# Patient Record
Sex: Female | Born: 1935
Health system: Southern US, Community
[De-identification: ages and names within clinical notes are randomized; demographics above are authoritative.]

## PROBLEM LIST (undated history)

## (undated) DIAGNOSIS — E119 Type 2 diabetes mellitus without complications: Secondary | ICD-10-CM

## (undated) DIAGNOSIS — I471 Supraventricular tachycardia, unspecified: Secondary | ICD-10-CM

## (undated) DIAGNOSIS — I4891 Unspecified atrial fibrillation: Secondary | ICD-10-CM

## (undated) DIAGNOSIS — G459 Transient cerebral ischemic attack, unspecified: Secondary | ICD-10-CM

## (undated) DIAGNOSIS — E785 Hyperlipidemia, unspecified: Secondary | ICD-10-CM

## (undated) DIAGNOSIS — I2510 Atherosclerotic heart disease of native coronary artery without angina pectoris: Secondary | ICD-10-CM

## (undated) HISTORY — DX: Supraventricular tachycardia, unspecified: I47.10

## (undated) HISTORY — PX: CARPAL TUNNEL RELEASE: SHX101

## (undated) HISTORY — DX: Hyperlipidemia, unspecified: E78.5

## (undated) HISTORY — DX: Atherosclerotic heart disease of native coronary artery without angina pectoris: I25.10

## (undated) HISTORY — DX: Supraventricular tachycardia: I47.1

## (undated) HISTORY — DX: Transient cerebral ischemic attack, unspecified: G45.9

## (undated) HISTORY — PX: REPLACEMENT TOTAL KNEE: SUR1224

## (undated) HISTORY — DX: Unspecified atrial fibrillation: I48.91

## (undated) HISTORY — PX: CHOLECYSTECTOMY: SHX55

## (undated) HISTORY — PX: ABDOMINAL HYSTERECTOMY: SHX81

## (undated) HISTORY — DX: Type 2 diabetes mellitus without complications: E11.9

---

## 1999-11-20 HISTORY — PX: SHOULDER SURGERY: SHX246

## 2000-02-07 ENCOUNTER — Encounter: Admission: RE | Admit: 2000-02-07 | Discharge: 2000-02-07 | Payer: Self-pay | Admitting: Orthopedic Surgery

## 2000-02-07 ENCOUNTER — Encounter: Payer: Self-pay | Admitting: Orthopedic Surgery

## 2000-02-15 ENCOUNTER — Encounter: Admission: RE | Admit: 2000-02-15 | Discharge: 2000-02-15 | Payer: Self-pay | Admitting: Orthopedic Surgery

## 2000-02-15 ENCOUNTER — Encounter: Payer: Self-pay | Admitting: Orthopedic Surgery

## 2000-02-16 ENCOUNTER — Ambulatory Visit (HOSPITAL_BASED_OUTPATIENT_CLINIC_OR_DEPARTMENT_OTHER): Admission: RE | Admit: 2000-02-16 | Discharge: 2000-02-16 | Payer: Self-pay | Admitting: Orthopedic Surgery

## 2000-03-08 ENCOUNTER — Encounter: Admission: RE | Admit: 2000-03-08 | Discharge: 2000-05-17 | Payer: Self-pay | Admitting: Orthopedic Surgery

## 2000-12-10 ENCOUNTER — Ambulatory Visit (HOSPITAL_BASED_OUTPATIENT_CLINIC_OR_DEPARTMENT_OTHER): Admission: RE | Admit: 2000-12-10 | Discharge: 2000-12-10 | Payer: Self-pay | Admitting: Orthopedic Surgery

## 2001-08-21 ENCOUNTER — Encounter: Payer: Self-pay | Admitting: Orthopedic Surgery

## 2001-08-26 ENCOUNTER — Inpatient Hospital Stay (HOSPITAL_COMMUNITY): Admission: RE | Admit: 2001-08-26 | Discharge: 2001-08-28 | Payer: Self-pay | Admitting: Orthopedic Surgery

## 2001-08-26 ENCOUNTER — Encounter: Payer: Self-pay | Admitting: Orthopedic Surgery

## 2001-08-28 ENCOUNTER — Inpatient Hospital Stay (HOSPITAL_COMMUNITY)
Admission: RE | Admit: 2001-08-28 | Discharge: 2001-09-01 | Payer: Self-pay | Admitting: Physical Medicine & Rehabilitation

## 2006-08-09 ENCOUNTER — Encounter: Payer: Self-pay | Admitting: Cardiology

## 2006-08-09 ENCOUNTER — Ambulatory Visit: Payer: Self-pay | Admitting: Cardiology

## 2006-08-16 ENCOUNTER — Ambulatory Visit: Payer: Self-pay | Admitting: Cardiology

## 2006-08-22 ENCOUNTER — Inpatient Hospital Stay (HOSPITAL_BASED_OUTPATIENT_CLINIC_OR_DEPARTMENT_OTHER): Admission: RE | Admit: 2006-08-22 | Discharge: 2006-08-22 | Payer: Self-pay | Admitting: Internal Medicine

## 2006-08-22 ENCOUNTER — Ambulatory Visit: Payer: Self-pay | Admitting: Internal Medicine

## 2006-09-11 ENCOUNTER — Ambulatory Visit: Payer: Self-pay | Admitting: Cardiology

## 2006-09-24 ENCOUNTER — Ambulatory Visit: Payer: Self-pay | Admitting: Cardiology

## 2006-10-15 ENCOUNTER — Encounter: Payer: Self-pay | Admitting: Cardiology

## 2006-10-17 ENCOUNTER — Ambulatory Visit: Payer: Self-pay | Admitting: Cardiology

## 2007-02-24 ENCOUNTER — Encounter: Payer: Self-pay | Admitting: Cardiology

## 2007-08-28 ENCOUNTER — Encounter: Payer: Self-pay | Admitting: Cardiology

## 2007-12-04 ENCOUNTER — Ambulatory Visit: Payer: Self-pay | Admitting: Cardiology

## 2009-01-04 ENCOUNTER — Ambulatory Visit: Payer: Self-pay | Admitting: Cardiology

## 2009-08-19 ENCOUNTER — Ambulatory Visit: Payer: Self-pay | Admitting: Cardiology

## 2009-08-19 DIAGNOSIS — I471 Supraventricular tachycardia, unspecified: Secondary | ICD-10-CM | POA: Insufficient documentation

## 2009-08-19 DIAGNOSIS — M25519 Pain in unspecified shoulder: Secondary | ICD-10-CM | POA: Insufficient documentation

## 2009-08-19 DIAGNOSIS — L659 Nonscarring hair loss, unspecified: Secondary | ICD-10-CM | POA: Insufficient documentation

## 2009-08-29 ENCOUNTER — Encounter (INDEPENDENT_AMBULATORY_CARE_PROVIDER_SITE_OTHER): Payer: Self-pay | Admitting: *Deleted

## 2010-02-27 ENCOUNTER — Ambulatory Visit: Payer: Self-pay | Admitting: Cardiology

## 2010-12-19 NOTE — Assessment & Plan Note (Signed)
Summary: 6 MO FU PER APRIL REMINDER-SRS   Visit Type:  Follow-up Primary Provider:  Quintin [name]  CC:  follow-up visit.  History of Present Illness: the patient is a pleasant 77 year old female who is her palpitations. Last office visit she reported alopecia and we changed the metoprolol to atenolol.this appear to have resolved.the patient reports occasional stinging pain in the chest which is very short-lived and resolved with Mylanta. She denies any exertional chest pain. She uses her stationary bicycle frequently and reports no problems of chest pain or shortness of breath. She does report some mild dyspnea upon walking. She denies any orthopnea PND. She has no recurrent palpitations.   Preventive Screening-Counseling & Management  Alcohol-Tobacco     Smoking Status: never  Current Problems (verified): 1)  Supraventricular Tachycardia  (ICD-427.89) 2)  Shoulder Pain, Right  (ICD-719.41) 3)  Alopecia  (ICD-704.00)  Current Medications (verified): 1)  Captopril 25 Mg Tabs (Captopril) .... Take 1 Tablet By Mouth Two Times A Day 2)  Atenolol 50 Mg Tabs (Atenolol) .... Take 1 Tablet By Mouth Once A Day 3)  Zocor 10 Mg Tabs (Simvastatin) .... Take 1 Tablet By Mouth Once A Day 4)  Aspirin 325 Mg Tabs (Aspirin) .... Take 1 Tablet By Mouth Once A Day 5)  Multivitamins  Tabs (Multiple Vitamin) .... Take 1 Tablet By Mouth Once A Day 6)  Caltrate 600+d 600-400 Mg-Unit Tabs (Calcium Carbonate-Vitamin D) .... Take 1 Tablet By Mouth Two Times A Day 7)  Glucosamine-Chondroitin  Caps (Glucosamine-Chondroit-Vit C-Mn) .... Take 1 Tablet By Mouth Two Times A Day  Allergies (verified): 1)  ! * Splenda  Comments:  Nurse/Medical Assistant: The patient's medications and allergies were reviewed with the patient and were updated in the Medication and Allergy Lists. List reviewed.  Past History:  Past Medical History: Last updated: 01/03/2009 nonobstructive coronary artery disease false positive  adenosine Cardiolite preserved left ventricular function history of paroxysmal supraventricular tachycardia, quiesced and on metoprolol diet-controlled diabetes mellitus dyslipidemia followed byprimary physician history of transient ischemic attack  Family History: Last updated: 01/03/2009 Mother:coronary artery disease  Social History: Last updated: 01/03/2009 Tobacco Use - No.  Alcohol Use - no  Risk Factors: Smoking Status: never (02/27/2010)  Review of Systems  The patient denies fatigue, malaise, fever, weight gain/loss, vision loss, decreased hearing, hoarseness, chest pain, palpitations, shortness of breath, prolonged cough, wheezing, sleep apnea, coughing up blood, abdominal pain, blood in stool, nausea, vomiting, diarrhea, heartburn, incontinence, blood in urine, muscle weakness, joint pain, leg swelling, rash, skin lesions, headache, fainting, dizziness, depression, anxiety, enlarged lymph nodes, easy bruising or bleeding, and environmental allergies.    Vital Signs:  Patient profile:   77 year old female Height:      61 inches Weight:      168 pounds Pulse rate:   65 / minute BP sitting:   112 / 72  (left arm) Cuff size:   regular  Vitals Entered By: [name] (February 27, 2010 8:42 AM) CC: follow-up visit   Physical Exam  Additional Exam:  General: Well-developed, well-nourished in no distress head: Normocephalic and atraumatic eyes PERRLA/EOMI intact, conjunctiva and lids normal nose: No deformity or lesions mouth normal dentition, normal posterior pharynx neck: Supple, no JVD.  No masses, thyromegaly or abnormal cervical nodes lungs: Normal breath sounds bilaterally without wheezing.  Normal percussion heart: regular rate and rhythm with normal S1 and S2, no S3 or S4.  PMI is normal.  No pathological murmurs abdomen: Normal bowel sounds, abdomen  is soft and nontender without masses, organomegaly or hernias noted.  No hepatosplenomegaly musculoskeletal:  Back normal, normal gait muscle strength and tone normal pulsus: Pulse is normal in all 4 extremities Extremities: No peripheral pitting edema neurologic: Alert and oriented x 3 skin: Intact without lesions or rashes cervical nodes: No significant adenopathy psychologic: Normal affect    EKG  Procedure date:  02/27/2010  Findings:      normal sinus rhythm. No acute ischemic changes.  Impression & Recommendations:  Problem # 1:  SUPRAVENTRICULAR TACHYCARDIA (ICD-427.89) No recurrent palpitations. Patient is tolerating atenolol. EKG is within normal limits. Her updated medication list for this problem includes:    Captopril 25 Mg Tabs (Captopril) .Marland Kitchen... Take 1 tablet by mouth two times a day    Atenolol 50 Mg Tabs (Atenolol) .Marland Kitchen... Take 1 tablet by mouth once a day    Aspirin 325 Mg Tabs (Aspirin) .Marland Kitchen... Take 1 tablet by mouth once a day  Orders: EKG w/ Interpretation (93000)  Problem # 2:  ALOPECIA (ICD-704.00) resolved after switching from metoprolol to atenolol  Problem # 3:  SHOULDER PAIN, RIGHT (ICD-719.41) patient had an x-ray which was within normal limits. This has resolved she does report a hard nodule over the left shoulder which is not mobile and nontender. I asked her to follow up with Dr. Quintin [name] on this. I suspect this is a ganglionic cyst although I cannot be certain and further evaluation may be required  Patient Instructions: 1)  Your physician recommends that you continue on your current medications as directed. Please refer to the Current Medication list given to you today. 2)  Follow up in  1 year.

## 2011-03-09 ENCOUNTER — Encounter: Payer: Self-pay | Admitting: *Deleted

## 2011-03-12 ENCOUNTER — Encounter: Payer: Self-pay | Admitting: Cardiology

## 2011-03-12 ENCOUNTER — Ambulatory Visit (INDEPENDENT_AMBULATORY_CARE_PROVIDER_SITE_OTHER): Payer: Medicare Other | Admitting: Cardiology

## 2011-03-12 VITALS — BP 118/75 | HR 50 | Ht 61.0 in | Wt 163.0 lb

## 2011-03-12 DIAGNOSIS — R072 Precordial pain: Secondary | ICD-10-CM

## 2011-03-12 DIAGNOSIS — L659 Nonscarring hair loss, unspecified: Secondary | ICD-10-CM

## 2011-03-12 DIAGNOSIS — I498 Other specified cardiac arrhythmias: Secondary | ICD-10-CM

## 2011-03-12 DIAGNOSIS — R079 Chest pain, unspecified: Secondary | ICD-10-CM

## 2011-03-12 MED ORDER — ATENOLOL 50 MG PO TABS: 50.0000 mg | ORAL_TABLET | Freq: Every day | ORAL | Status: DC

## 2011-03-12 NOTE — Progress Notes (Signed)
HPI The patient is an 77 year old female with history of palpitations. She reports all that she had in the last clinic visit and we changed her Toprol to atenolol. She reported atypical chest pain which was very short-lived and resolved with Mylanta. She continues use of stationary bicycle reports no problems with chest pain or shortness of breath. The patient reported an episode of chest pain last night. The pain did not resolve with nitroglycerin. She felt this was a sharp pain which was radiating to the back. She felt that she could burp that would relieve the pain. She tried some Zantac eventually Maalox made it better. There was no exertional component to the pain. Also her electrocardiogram today shows no acute changes. The patient has had significant problems with reflux in the past. The patient has been doing well from a cardiovascular perspective. She's had no recurrence of palpitations. Vital signs were stable during this visit.  Allergies  Allergen Reactions  . Food     SLENDA    Current Outpatient Prescriptions on File Prior to Visit  Medication Sig Dispense Refill  . aspirin 325 MG tablet Take 325 mg by mouth daily.        . Calcium Carbonate-Vitamin D (CALTRATE 600+D) 600-400 MG-UNIT per tablet Take 1 tablet by mouth 2 (two) times daily.        . captopril (CAPOTEN) 25 MG tablet Take 25 mg by mouth 2 (two) times daily.        Marland Kitchen glucosamine-chondroitin 500-400 MG tablet Take 1 tablet by mouth 2 (two) times daily.        . Multiple Vitamin (MULTIVITAMIN) tablet Take 1 tablet by mouth daily.        . simvastatin (ZOCOR) 10 MG tablet Take 10 mg by mouth at bedtime.        Marland Kitchen DISCONTD: atenolol (TENORMIN) 50 MG tablet Take 50 mg by mouth daily.          Past Medical History  Diagnosis Date  . Coronary artery disease     nonobstructive  . Diabetes mellitus     diet controlled  . Dyslipidemia     followed by primary care physician  . TIA (transient ischemic attack)   . History of  paroxysmal supraventricular tachycardia     quiesced and on metoprolol  . False positive cardiac stress test     false positive adenosine Cardiolite  . Left ventricular dysfunction with preserved left ventricular function     No past surgical history on file.  Family History  Problem Relation Age of Onset  . Coronary artery disease Mother     History   Social History  . Marital Status: Widowed    Spouse Name: N/A    Number of Children: N/A  . Years of Education: N/A   Occupational History  . Not on file.   Social History Main Topics  . Smoking status: Never Smoker   . Smokeless tobacco: Not on file  . Alcohol Use: No  . Drug Use: Not on file  . Sexually Active: Not on file   Other Topics Concern  . Not on file   Social History Narrative  . No narrative on file   The review of systems:Pertinent positives as outlined above. The remainder of the 18  point review of systems is negative   PHYSICAL EXAM BP 118/75  Pulse 50  Ht 5\' 1"  (1.549 m)  Wt 163 lb (73.936 kg)  BMI 30.80 kg/m2  General: Well-developed, well-nourished in  no distress Head: Normocephalic and atraumatic Eyes:PERRLA/EOMI intact, conjunctiva and lids normal Ears: No deformity or lesions Mouth:normal dentition, normal posterior pharynx Neck: Supple, no JVD.  No masses, thyromegaly or abnormal cervical nodes Lungs: Normal breath sounds bilaterally without wheezing.  Normal percussion Cardiac: regular rate and rhythm with normal S1 and S2, no S3 or S4.  PMI is normal.  No pathological murmurs Abdomen: Normal bowel sounds, abdomen is soft and nontender without masses, organomegaly or hernias noted.  No hepatosplenomegaly MSK: Back normal, normal gait muscle strength and tone normal Vascular: Pulse is normal in all 4 extremities Extremities: No peripheral pitting edema Neurologic: Alert and oriented x 3 Skin: Intact without lesions or rashes Lymphatics: No significant adenopathy Psychologic: Normal  affect  ECG:  ASSESSMENT AND PLAN

## 2011-03-12 NOTE — Patient Instructions (Signed)
Continue all current medications. Your physician wants you to follow up in: 6 months.  You will receive a reminder letter in the mail one-two months in advance.  If you don't receive a letter, please call our office to schedule the follow up appointment   

## 2011-03-12 NOTE — Assessment & Plan Note (Signed)
Controlled on atenolol. The Toprol was previously discontinued because of alopecia.

## 2011-03-12 NOTE — Assessment & Plan Note (Signed)
No recurrent chest pain continue medical therapy. The patient now is mainly symptoms of reflux. I recommended Prilosec over-the-counter. If the patient's chest pain continues or recurs then we will schedule her for a dobutamine echocardiogram because of a prior false-positive adenosine Cardiolite study.

## 2011-04-03 NOTE — Assessment & Plan Note (Signed)
[place] OFFICE NOTE   NAME:[name], [name] LANDERS                         MRN:          [id number]  DATE:12/04/2007                            DOB:          23-Jan-1927    PRIMARY CARDIOLOGIST:  Dr. Terald Sleeper.   REASON FOR VISIT:  Annual follow-up.   The patient returns to the office, since last being seen here by Dr.  Dannielle Burn in November 2007.  She has a history of nonobstructive coronary  artery disease by cardiac catheterization in October 2007, normal  ventricular function, hypertension, dyslipidemia, and diet-controlled  diabetes mellitus.   The patient also has history of PSVT which has been well controlled on  Lopressor, initially started by the late Dr. Carin Primrose.   In reviewing [name]'s chart, I find that she was initially referred to  Dr. Dannielle Burn in September 2007, following a stress Cardiolite which was  abnormal and suggestive of an anterior defect.  Her follow-up cardiac  catheterization, however, suggested nonobstructive CAD with lesions of  20-30% in the LAD and CFX arteries.   Clinically, although [name] is limited in her mobility secondary to  arthritis, she denies any exertional chest discomfort.  She also denies  any significant exertional dyspnea.   With respect to palpitations, she states that these have decreased in  frequency, since she was placed on Lopressor.  They occur perhaps once a  month and are not very symptomatic.   Electrocardiogram today reveals sinus bradycardia at 58 bpm with normal  axis and no ischemic changes.   MEDICATIONS:  Metoprolol 25 b.i.d., captopril 25 of b.i.d., aspirin 325  daily, Zocor 10 daily.   PHYSICAL EXAMINATION:  Blood pressure 126/72, pulse 72, regular weight  156.8 (Down 18 pounds).  General 77 year old female sitting upright in no distress.  HEENT:  Normocephalic, atraumatic.  NECK:  Palpable bilateral carotid pulse without bruits; no  JVD.  LUNGS:  Clear to auscultation all fields.  HEART:  Regular rate and rhythm (S1, S2), no significant murmurs.  ABDOMEN:  Benign, nontender.  EXTREMITIES:  1+ bilateral, nonpitting edema.  NEURO:  No focal deficit.   IMPRESSION:  1. Nonobstructive coronary artery disease.      a.     False positive adenosine Cardiolite  2. Preserved left ventricular function.  3. History of paroxysmal supraventricular tachycardia, quiescent on      metoprolol  4. Diet-controlled diabetes mellitus.  5. Dyslipidemia. followed by Dr. Woody Seller.  6. History of a transient ischemic attack.   PLAN:  1. Continue current medication regimen.  2. Aggressive lipid management, with LDL target of 70 or less, per Dr.      Woody Seller.  3. Schedule return clinic follow-up with Dr. Dannielle Burn in 1 year.      Gene Serpe, PA-C  Electronically Signed      Ernestine Mcmurray, MD,FACC  Electronically Signed   GS/MedQ  DD: 12/04/2007  DT: 12/04/2007  Job #: [id number]   cc:   Jerene Bears, MD

## 2011-04-06 NOTE — Discharge Summary (Signed)
. [facility]  Patient:    [name], [name] Visit Number: [id number] MRN: [id number]          Service Type: [facility] Location: [place] 02 Attending Physician:  Gilmore Laroche Dictated by:   Evert Kohl, P.A. Admit Date:  08/28/2001 Disc. Date: 08/28/01   CC:         Dr. Rayann Heman, [place]   Discharge Summary  DATE OF BIRTH:  Aug 06, 1927  ADMISSION DIAGNOSES: 1. Osteoarthritis of bilateral knees, left worse than right. 2. Type diabetes mellitus. 3. Benign positional vertigo. 4. Cataracts.  HISTORY OF PRESENT ILLNESS:  The patient is a 77 year old female who presents with a 10-year history of gradually worsening left knee pain.  The patient has OA in bilateral knees.  Left knee arthroscopy performed in 1997, with no previous injury.  The pain is intermittent with throbbing and burning sensation, diffuse about the knee with occasional radiation down to the ankle. The pain worsens with weightbearing activities.  It does improve with rest. She does have popping, slipping and occasional night pain and she has been using a cane for six months for ambulation.  Hyalgan and Voltaren gives her some moderate relief.  ALLERGIES:  No known drug allergies.  MEDICATIONS: 1. Aspirin 325 mg p.o. q.d. 2. Voltaren 50 mg p.o. b.i.d. 3. Glucosamine chondroitin 900 mg b.i.d. 4. Lipitor 5 mg p.o. q.4h. 5. Multivitamin one tablet p.o. q.d. 6. Calcium with D 600 mg b.i.d. 7. Captopril 12.5 mg p.o. b.i.d. 8. Iron 325 mg b.i.d.  PROCEDURE:  On August 26, 2001, the patient was taken to the OR by Dr. Gildardo Cranker, assisted by Zenaida Deed, P.A.-C.  Under general anesthesia, a left total knee arthroplasty was performed using a three pegged cemented patella with a standard left cemented femoral component with a size 3 cemented tibial Kiel tray and a 10 mm standard poly insert.  The patient tolerated the procedure well.  There were no complications.  One  Hemovac drain was left in place.  CONSULTATIONS: 1. Physical therapy. 2. Occupational therapy. 3. Case management. 4. Rehabilitation.  HOSPITAL COURSE:  On August 26, 2001, the patient was admitted to [facility] under the care of Dr. Barbaraann Rondo A. Mortenson.  The patient was taken to the OR where a left total knee arthroplasty was performed.  The patient tolerated the procedure well and was transferred to rehabilitation and then orthopedic floor for further rehab care.  The patient was started on Arixtra 2.5 mg subcu for a total of seven days for postoperative DVT prophylaxis.  On postop day #1, the patient was fairly comfortable without any significant complaints.  Her H&H was 8.0 and 23.9.  She had two units of autologous blood available so these were transfused without any significant untoward events. Otherwise, the patients labs were within acceptable ranges.  The patients leg was neurovascularly intact and the patient was tolerating the CPM well.  On postop day #2, the patients H&H did improve to 9.5 and 27.8.  She was slightly febrile at 101.7, but her lungs were clear.  There was no urinary symptoms.  Her left surgical wound was benign for any signs of infection and the patient had no other significant complaints.  It was felt to be related to atelectasis.  The patient was encouraged to deep cough breathe and use the incentive spirometer frequently throughout every hour that she is awake.  The wound on her left knee was benign with no signs  of erythema or discharge.  She had no Homans sign.  Otherwise, her vital signs were normal and her pain was well-controlled.  The patient was taken off her PCA and placed on OxyContin CR 10 mg p.o. q.12h. and Percocet p.r.n.  A bed was available for a rehab transfer today.  The patient wished to go to rehabilitation and we felt that she was orthopedically stable, so she was transferred to rehab in good condition.  LABORATORY DATA  AND X-RAY FINDINGS:  CBC on October 10, with WBC 7.3, hemoglobin 9.5, hematocrit 27.8, platelets 215.  Routine chemistries with sodium 140, potassium 3.4, chloride 103, CO2 31, BUN 9, creatinine 0.8, glucose 129.  Limited CT of the chest without contrast to reevaluate a chest x-ray that showed a questionable nodular density in left base on admission. EKG was normal sinus rhythm at 77 beats per minute.  DISCHARGE MEDICATIONS:  1. Colace 100 mg p.o. b.i.d.  2. Senokot 8.6 mg p.o. b.i.d. q.a.c.  3. Arixtra 2.5 mg subcu q.24h.  4. Zocor 10 mg p.o. q.h.s.  5. Multivitamins with mineral one capsule p.o. q.d.  6. Calcium carbonate with D one tablet p.o. b.i.d.  7. Capoten 12.5 mg p.o. b.i.d.  8. Ferrous sulfate 325 mg p.o. b.i.d.  9. Reglan 10 mg IV q.6h. p.r.n. 10. Benadryl 25 mg p.o. q.6h. p.r.n. 11. Percocet one to two tablets p.o. q.4h. p.r.n. 12. OxyContin CR 10 mg p.o. q.12h. 13. Laxative or enema of choice. 14. Robaxin 500 mg p.o. q.6h. p.r.n. 15. Restoril 15 mg p.o. q.h.s. p.r.n.  SPECIAL INSTRUCTIONS:  The patient is to continue medications as administered on orthopedics floor.  The patient is to have Arixtra 2.5 mg subcu q.d. for a total of seven days.  DIET:  No restrictions.  ACTIVITY:  As tolerated.  Maintain 50% of body weight on left lower extremity with use of a walker until instructed otherwise by Dr. Alphonzo Cruise.  WOUND CARE:  Keep wound clean and dry.  Check daily for any signs of infection.  FOLLOWUP:  Follow-up appointment with Dr. Alphonzo Cruise in one week from this following Monday.  Please call for follow-up appointment.  CONDITION ON DISCHARGE:  Condition on discharge to rehab is improved and good. ictated by:   Evert Kohl, P.A. Attending Physician:  Gilmore Laroche DD:  08/28/01 TD:  08/28/01 Job: [id number]

## 2011-04-06 NOTE — Op Note (Signed)
[place]. [facility]  Patient:    [name], [name]                         MRN: [id number] Proc. Date: 02/16/00 Adm. Date:  [id number] Attending:  Pryor Curia                           Operative Report  PREOPERATIVE DIAGNOSIS:  Rotator cuff tear, right shoulder.  POSTOPERATIVE DIAGNOSIS:  Rotator cuff tear, right shoulder.  OPERATION:  NEER anterior one-third acromioplasty and repair rotator cuff using  Mitek sutures.  SURGEON:  Geroge Baseman. Alphonzo Cruise, M.D.  ANESTHESIA:  General  DESCRIPTION OF PROCEDURE:  After satisfactory endotracheal anesthesia, the patient was placed on the operating table in the semisitting position.  The right shoulder and upper extremity was prepped with Duraprep and draped out in the usual manner. Incision made over the anterior and lateral aspect of the acromion.  The deltoid fibers were released off the anterior aspect of the acromion.  Subacromion space was entered. A very generous NEER anterior one-third acromioplasty was done with a power saw.  Excellent decompression of the anterior aspect of acromion was achieved.  This gave good access to the subacromial space.  The subacromial bursa was excised.  There was a massive tear of the rotator cuff which was retracted .5 to 2 cm.  The cuff could be brought down and covered about two-thirds of the anterior portion of the humeral head.  The posterior third or one-quarter was not covered posteriorly.  A series of three Mitek staples were placed anterior at the junction of the articular and nonarticular surface about the greater tuberosity. Sutures were placed through the rotator cuff and the rotator cuff was then advanced distally with the arm abducted and sutured in place.  An excellent repair with he Mitek sutures was achieved. Again a water tight closure was not possible but good mechanical stability of the shoulder was achieved.  Throughout the  procedure, the shoulder was irrigated with copious amounts of antibiotics solution.  No other pathology was seen. The deltoids were then reattached with heavy Vicryl suture.  2-0 Vicryl was used to close subcutaneous tissue and a 2-0 nylon subcuticular stitch was used to close the wounds.  Sterile dressings were applied and the patient returned to the recovery room in excellent condition.  The patient was placed in an abduction pillow postoperatively.  Complications were none. Drains  none. DD:  02/16/00 TD:  02/17/00 Job: [id number]

## 2011-04-06 NOTE — Discharge Summary (Signed)
[place]. [facility]  Patient:    [name] Visit Number: [id number] MRN: [id number]          Service Type: [facility] Location: [id number] Attending Physician:  Gilmore Laroche Dictated by:   Lavon Paganini. [place], P.A. Admit Date:  08/28/2001 Disc. Date: 09/01/01   CC:         [name] A. Alphonzo Cruise, M.D.  Carin Primrose, M.D.,[address], [place], [place] [address]   Discharge Summary  DISCHARGE DIAGNOSES: 1. Left total knee arthroplasty on August 26, 2001. 2. Anemia. 3. Hypertension. 4. Diet controlled diabetes mellitus. 5. Urinary retention, resolving. 6. Hyperlipidemia.  HISTORY OF PRESENT ILLNESS:  A 77 year old white female admitted on August 26, 2001 with end-stage degenerative joint disease of the left knee.  No relief with conservative care.  Underwent a left total knee arthroplasty on August 26, 2001 by Dr. Alphonzo Cruise.  Placed on Arixtra for deep venous thrombosis prophylaxis and 50% partial weight bearing.  Postoperative anemia and transfused.  No chest pain or shortness of breath.  Preoperative chest x-ray showed a nodule with follow up CT of the chest postoperatively negative for any masses.  Minimal assistance for ambulation. Moderate assist transfers. Latest hemoglobin 9.5. Chemistries unremarkable.  Admitted for a comprehensive rehab program.  PAST MEDICAL HISTORY:  See discharge diagnoses.  PAST SURGICAL HISTORY:  Hysterectomy, carpal tunnel release, right rotator cuff repair.  PRIMARY PHYSICIAN:  Dr. Liane Comber of [place], [place].  ALLERGIES:  None.  HABITS:  No alcohol or tobacco.  MEDICATIONS PRIOR TO ADMISSION:  Aspirin, Voltaren, Lipitor, Captopril and iron sulfate.  SOCIAL HISTORY:  Lives with husband in [place], [place]. Independent with a cane prior to admission. One level home. Three steps to entry. Husband with recent coronary artery bypass surgery as well as back surgery.  Local family with assistance.  HOSPITAL  COURSE:  The patient did well while in rehabilitation services with therapies initiated on a b.i.d. basis.  The following issues were followed during the patients rehab course:  Pertaining to [name] left total knee arthroplasty remained stable.  Surgical site healing nicely.  No signs of infection.  She would follow up with Dr. Alphonzo Cruise in one week for removal of clips.  She is 50% partial weight bearing.  Ambulating household distances with a walker.  Using CPM machine 0 to 75 degrees.  This had been ordered upon discharge to continue to maximum of 90 degrees.  She continued on Arixtra during her rehab course.  Venous Doppler studies prior to discharge were negative.  She would resume her aspirin therapy.  Blood pressures were controlled with Captopril.  No headache or dizziness.  Blood sugars with diet control of 1800 calorie ADA. Blood sugars ranged 116, 101, 135.  Noted urinary retention postoperatively after knee surgery with post void residuals of greater than 800 cc.  This continued to improve as her mobility improved. She was placed on Flomax on August 31, 2001.  Bladder scan 47 cc and 30 cc.  She did have a urinalysis study greater than 30,000 Enterococcus. Due to her symptomatic issues she was placed on Macrobid times seven days.  Overall for her functional mobility, she was ambulating extended household distances with a walker.  Essentially independent to standby assist in all areas of activities of daily living of dressing, grooming and homemaking. Overall her strength and endurance greatly improved.  She was encouraged with overall progress.  She was discharged home with ongoing home therapies.  Latest labs  showed a hemoglobin 9.4, hematocrit 27. Sodium 138, potassium 3.2, BUN 9, creatinine 0.7.  DISCHARGE MEDICATIONS: 1. Captopril 25 mg 1/2 tablet b.i.d. 2. Lipitor 5 mg q.d. 3. Os-Cal 500 mg b.i.d. 4. Iron sulfate b.i.d. 5. Tylox one or two tablets q.4h. p.r.n.  pain. 6. Multivitamin q.d. 7. Flomax 0.4 mg at bedtime. 8. Ecotrin 325 mg q.d. 9. Macrobid 100 mg b.i.d.  ACTIVITY:  Partial weight bearing.  No driving.  Home Health physical and occupational therapy.  Home CPM machine for recent knee surgery.  Increased 90 degrees.  DIET:  1800 calorie ADA.  FOLLOW-UP:  She is to follow up with Dr. Alphonzo Cruise in one week for removal of staples.  She should follow up Dr. Alphonzo Cruise, orthopedic services as advised and Dr. Liane Comber, medical management. Dictated by:   Lavon Paganini. [place], P.A. Attending Physician:  Gilmore Laroche DD:  09/01/01 TD:  09/01/01 Job: [id number]

## 2011-04-06 NOTE — Assessment & Plan Note (Signed)
[place] OFFICE NOTE   NAME:Orsino, EZME GLANCY                         MRN:          [id number]  DATE:09/11/2006                            DOB:          11-21-1926    REFERRING PHYSICIAN:  Carin Primrose   HISTORY OF PRESENT ILLNESS:  The patient is a 77 year old female with  history of dyslipidemia and diet-controlled diabetes.  The patient underwent  recent cardiac catheterization, which showed no significant coronary artery  disease.  The patient does report ongoing palpitations, but no definite  chest pain.  She has no orthopnea or PND.  She has no syncope.   MEDICATIONS:  1. Prilosec 2 q. day.  2. Zocor 20 mg p.o. nightly.  3. Calcium.  4. Multivitamin.  5. Chondroitin glucosamine.  6. Aspirin 325 q. day.  7. Lopressor 25 b.i.d.  8. Captopril 25 mg p.o. b.i.d.   PHYSICAL EXAMINATION:  VITAL SIGNS:  Blood pressure 118/66, heart rate is 70  beats per minute.  NECK:  Normal carotid upstroke.  No carotid bruits.  LUNGS:  Clear breath sounds bilaterally.  HEART:  Regular rate and rhythm.  Normal S1, S2.  No murmurs, rubs, or  gallops.  ABDOMEN:  Soft, nontender.  No rebound or guarding.  Good bowel sounds.  EXTREMITIES:  No cyanosis, clubbing, or edema.   A 12-lead electrocardiogram shows normal sinus rhythm.  No acute ischemic  changes.   PROBLEM LIST:  1. Palpitations.  2. Substernal chest pain, resolved status post negative cardiac      catheterization.  3. Knee surgery.  4. Dyslipidemia.  5. History of diet-controlled diabetes mellitus.   PLAN:  1. The patient is doing well.  She has no substernal chest pain.  A      catheterization showed nonobstructive coronary artery disease.  2. The patient does appear to have palpitations and we will order      CardioNet monitor to make      sure she does not have atrial fibrillation.  3. The patient will follow up with [place] in 4 weeks.       Ernestine Mcmurray, MD,FACC     GED/MedQ  DD:  09/11/2006  DT:  09/12/2006  Job #:  [id number]   cc:   Carin Primrose

## 2011-04-06 NOTE — Cardiovascular Report (Signed)
NAMEWILLISTINE, CRIDER                  ACCOUNT NO.:  [id number]   MEDICAL RECORD NO.:  [id number]          PATIENT TYPE:  OIB   LOCATION:  1961                         FACILITY:  [facility]   PHYSICIAN:  Shaune Pascal. Bensimhon, MDDATE OF BIRTH:  Feb 20, 1927   DATE OF PROCEDURE:  08/22/2006  DATE OF DISCHARGE:                              CARDIAC CATHETERIZATION   PRIMARY CARE PHYSICIAN:  Dr. Carin Primrose.   CARDIOLOGIST:  Dr. Terald Sleeper.   PATIENT IDENTIFICATION:  Ms. Loch is a delightful 77 year old woman with a  history of diabetes, hypertension and hyperlipidemia.  She recently has been  having exertional chest pain.  She had a Myoview study which was abnormal  and she is referred for cardiac catheterization in the outpatient diagnostic  lab.   PROCEDURES PERFORMED:  1. Selective coronary angiography.  2. Left heart cath.  3. Left ventriculogram.   DESCRIPTION OF PROCEDURE:  The risks and benefits of catheterization were  explained.  Consent was signed and placed on the chart.  A 4-French arterial  sheath was placed in the right femoral artery using modified Seldinger  technique.  Standard catheters including JL-4, 3-D RC and an angled pigtail  were used for the catheter.  All catheter exchanges were made over a wire.  There were no apparent complications.   Central aortic pressure was 148/70 with a mean of 104.  LV pressure was  145/7 with an EDP of 16.  There was no aortic stenosis.   Left main was normal.   LAD was a long vessel wrapping the apex.  It gave off a small first diagonal  and a large second diagonal.  There was a 30% lesion in the proximal portion  of the LAD.   Left circumflex was moderate size system.  It gave off a large OM1 and a  small posterolateral.  There was a 20% stenosis in the mid section.   Right coronary artery was a moderate sized dominant vessel.  It gave off an  RV branch, a small PDA and a small posterolateral.  There was no  angiographic CAD.   Left ventriculogram done in the RAO position showed an EF of 65% with no  regional wall motion abnormalities.  There was no mitral regurgitation.  There was significant mitral annular calcification noted.   ASSESSMENT:  1. Minimal nonobstructive coronary artery disease as described above.  2. Normal left ventricular function.  3. Significant mitral annular calcification without any significant mitral      regurgitation.   PLAN/DISCUSSION:  I suspect Ms. Yodice's chest pain is likely noncardiac.  Will continue risk factor modification.  If her pain continues consider  workup of other etiologies.      Shaune Pascal. Bensimhon, MD  Electronically Signed     DRB/MEDQ  D:  08/22/2006  T:  08/23/2006  Job:  [id number]   cc:   Lolita Rieger, MD,FACC

## 2011-04-06 NOTE — Assessment & Plan Note (Signed)
[place] OFFICE NOTE   NAME:[name], [name]                         MRN:          [id number]  DATE:08/16/2006                            DOB:          October 01, 1927    REFERRING PHYSICIAN:  Carin Primrose   REASON FOR CONSULTATION:  Evaluation of an elderly patient with chest pain  and abnormal Cardiolite stress study.   HISTORY OF PRESENT ILLNESS:  The patient is a 77 year old female with a  history of dyslipidemia and diet-controlled diabetes mellitus.  The patient  has no prior history of coronary artery disease.  However, over he last  several weeks she has reported onset of exertional chest pain.  She states  that she has some chest tightness with some radiation to the left shoulder  and left arm.  She has not experienced any resting chest pain.  She  typically goes out in her back yard to feed the dog, and might develop some  chest discomfort.  Given this history, the patient was set up for a  Cardiolite stress study by Dr. Liane Comber, which was interpreted by Dr.  Domenic Polite.  The patient received adenosine.  There were ST segment changes on  1 to 1.5 mm in lead II, III and aVF during the adenosine infusion.  There  was also a reversible anteroapical defect, showed summed stress score of 6,  the ejection fraction was normal 66%.  Dr. Domenic Polite was concerned that this  could represent ischemia.  The patient then saw Dr. Liane Comber in followup.  She  reported on Monday an episode of substernal chest pain that lasted a few  minutes when she walked from the kitchen to the front door.  There was  associated left shoulder radiation and left arm pain.  There were some  associated palpitations, but no sweatiness or diaphoresis.  The patient  denies any nausea.  She also reports no syncope.  The patient was then  referred for an evaluation in the office today.  She was started in the  interim on Zocor, Lopressor and  nitroglycerin per Dr. Liane Comber.  She has  remained pain-free since Monday.  She also reports no orthopnea or PND.   SOCIAL HISTORY:  The patient lives in [place].  Her husband died from sudden  cardiac death in 03/02/02.  The patient does not smoke.   FAMILY HISTORY:  Notable for her mother, who had congestive heart failure as  an elderly.   PAST MEDICAL HISTORY:  See problem list below.   MEDICATIONS:  1. Prilosec 2 tablets daily.  2. Capoten 25 mg two tablets p.o. b.i.d.  3. Zocor 10 mg two tablets p.o. daily.  4. Calcium.  5. Vitamin D.  6. Vitamin E.  7. Lopressor 50 mg p.o. daily.  8. Chondroitin glucosamine 2 tablets daily.  9. Aspirin 325 mg p.o. daily.  10.Diclofenac 20 mg every other day.   ALLERGIES:  The patient does not have dye allergies.   REVIEW OF SYSTEMS:  As per HPI.  No nausea or vomiting, no claudication, no  dysuria  or frequency, no melena or hematochezia, no orthopnea or PND, no  syncope.  The patient walks with a cane and some unsteady gait.   PHYSICAL EXAMINATION:  VITAL SIGNS:  Blood pressure is 130/76, heart rate is  60 beats per minute, weight is 173 pounds.  GENERAL:  Well-nourished white female in no apparent distress.  HEENT:  Pupils anicteric, conjunctivae clear.  NECK:  Supple, normal carotid upstroke, no carotid bruits.  JVD 6 cm.  No  thyromegaly.  LUNGS:  Clear breath sounds bilaterally.  HEART:  Regular rate and rhythm with normal S1, S2, no murmurs, rubs or  gallops.  I heard an occasional extra systole.  ABDOMEN:  Soft, nontender, no rebound or guarding.  Good bowel sounds.  EXTREMITIES:  No cyanosis, clubbing or edema.  NEUROLOGIC:  The patient is alert, oriented, and grossly nonfocal.   A 12-lead electrocardiogram normal sinus rhythm with no acute ischemic  changes.   Laboratory work is pending in preparation for catheterization.  Of note is  that the patient does not have dye allergies.   PROBLEM LIST:  1. Substernal chest pain.       a.     Rule out coronary artery disease.      b.     Abnormal Cardiolite stress study with ST segment changes during       adenosine infusion and an anterior defect.      c.     Chest pain with typical and atypical features.      d.     Cardiac risk factors include dyslipidemia and diabetes.  2. History of knee surgery.  3. History of arthritis.  4. History of dyslipidemia.  5. History of diet-controlled diabetes mellitus.   PLAN:  1. The patient does have multiple risk factors for coronary artery      disease.  Particularly the episode from Monday is concerning for      angina, although there are some atypical features to her chest pain      syndrome.  The patient did have an abnormal Cardiolite stress study.  I      have discussed with the patient that the best course of action is to      proceed with a cardiac catheterization to rule out significant coronary      artery disease.  The patient is in agreement with this, and we will      proceed next week.  2. The patient is on good medical therapy, which has been initiated by Dr.      Liane Comber in the interim.  3. Discussed the risks and benefits of the cardiac catheterization with      the patient, and she is willing to proceed.       Ernestine Mcmurray, MD,FACC     GED/MedQ  DD:  08/16/2006  DT:  08/18/2006  Job #:  [id number]   cc:   Carin Primrose

## 2011-04-06 NOTE — Op Note (Signed)
[place]. [facility]  Patient:    [name], [name] LANTAGNE. Visit Number: [id number] MRN: [id number]          Service Type: Attending:  Geroge Baseman. Alphonzo Cruise, M.D. Dictated by:   Geroge Baseman Alphonzo Cruise, M.D. Proc. Date: 08/26/01                             Operative Report  PREOPERATIVE DIAGNOSIS:  Severe osteoarthritis, left knee.  POSTOPERATIVE DIAGNOSIS:  Severe osteoarthritis, left knee.  PROCEDURE:  Total knee replacement on the left using a three-peg cemented patella with a standard left cemented femoral component, with a size 3 cemented tibial keeled tray, with a 10 mm standard-size poly insert.  ANESTHESIA:  General.  SURGEON:  Rodney A. Alphonzo Cruise, M.D.  ASSISTANT:  Zenaida Deed, P.A.  DESCRIPTION OF PROCEDURE:  The patient placed on the operating table in supine position and a pneumatic tourniquet about the left upper thigh.  The entire left lower extremity was prepped with Duraprep and draped out in the usual manner after satisfactory oroendotracheal anesthesia.  A Vi-Drape was placed over the operative site.  The leg was wrapped out with an Esmarch and the tourniquet was elevated.  An incision made starting down near the tubercle and carried proximally to the patella.  A self-retaining retractor was put in the wound, bleeders were coagulated.  A long median parapatellar incision made with the cutting cautery.  The patella was everted.  The fat pad was partially excised.  The knee was flexed 90 degrees, both medial and lateral meniscus were excised.  Tibial guide #1 was placed over the anterior aspect of the tibia, put to what was felt to be the appropriate level, fixed with fixation pins.  The tibial guide was removed, and the external guide was applied to the cutting block.  Good alignment was felt to be achieved.  The capture guide was then applied to the cutting block, and the proximal end of the tibia was amputated.  An excellent cut was achieved.   Both the medial and lateral meniscus were excised, and the anterior and posterior cruciate ligaments were released off the femur.  Attention was then turned to the distal end of the femur.  A box was placed over the anterior aspect of the femur, and femoral guide #1 was placed over the anterior aspect of the femur.  A drill hole was made and the intramedullary rod was inserted.  The C clamp was inserted and the femoral guide placed on the cut surface of the tibia, the tibia flexed 90 degrees.  This had the proper rotation for the femoral guide, and this was locked into place with fixation pins.  Using a capture guide, both anterior and posterior condyles of the femur were amputated.  Femoral guide #1 was removed, and femoral guide #2 was placed over the anterior cut surface of the femur, held in position, and locked in place with fixation pins.  Placing the knee in extension, the spacer block was inserted, and this was felt to be the appropriate-length cut.  The capture guide was applied, and the distal end of the femur was amputated.  The 10 mm spacer block was put in place with the knee in full extension.  There was excellent balance in both medial and lateral collateral ligaments.  With the knee flexed 90 degrees, there was also excellent balancing over the medial and lateral collateral ligaments with a 10 mm insert.  Femoral guide #3 was placed over the distal end of the femur, locked in position, and the chamfer cuts were made.  Attention then turned to the proximal tibia.  A McCale retractor was used to sublux the tibia anteriorly.  A size 3 trial was put on the cut surface of the tibia and locked in place with fixation pins.  A tower was applied, and a drill hole was placed in the proximal tibia.  The keel with wings was then driven down into the tibial tray and locked in position.  The 10 mm poly trial was inserted, and the trial femoral component was inserted over the distal end of  the femur. The knee was put through a full range of motion.  There was full flexion, full extension, good stability to varus and valgus stress as well as in extension and flexion.  Attention now turned to the posterior aspect of the patella. The patella cutting guide was applied, the posterior aspect of the patella amputated, and then three holes placed over the posterior aspect of the patella using a hole guide.  This was removed, and the patella trial was removed.  The knee was then put through a full range of motion.  There was some lateral tilting of the patella, and a lateral release was done.  This realigned the patella very nicely.  The knee was quite stable.  The anterior drawer sign was negative.  Once again there was good balancing of all ligaments.  All the components were removed, pulsatile lavage was used, all debris removed.  The antibiotic solution was placed in the bone and vacuumed out.  Glue was then mixed, all the components were placed on the back table. Glue was placed over the cut surface of the proximal tibia, and the tibial tray was inserted and driven home with an impacter.  Excess glue was removed. Glue was placed over the distal end of the femur and the posterior runners of the femoral component.  The femoral component was then inserted, and this fit very nicely.  Excess glue was removed.  A 10 mm spacer trial was then inserted, and the knee was put in full extension and excess glue was then removed.  Glue was placed on the posterior aspect of the patella, and the patellar prosthesis was inserted, placing the patellar clamp.  Again excess glue was removed.  Once the glue had hardened, all the clamps were removed. An osteotome was used to remove any flecks of bone cement around the tibia, the patella, and the femoral component.  The tibial insert was then removed. All debris was removed.  The tourniquet was dropped, bleeders were coagulated. A fairly dry wound was  achieved.  A Hemovac drain was then inserted.  The final tibial insert was inserted, and the knee was put through a full range of  motion and again there was excellent balancing of all ligaments.  The knee was then flexed about 90 degrees.  Towel clips were used to close the wound and an interrupted line of sutures using Ethibond was used to close along the median parapatellar incision.  _____ was used to close the subcutaneous tissue and stainless steel staples used to close the skin.  Sterile dressings were applied.  The patient returned to the recovery room in excellent condition. Technically this procedure went beautifully.  COMPLICATIONS:  None.  DRAINS:  Hemovac. Dictated by:   Geroge Baseman Alphonzo Cruise, M.D. Attending:  Geroge Baseman Alphonzo Cruise, M.D. DD:  08/26/01 TD:  08/27/01  Job: [id number]

## 2011-04-06 NOTE — H&P (Signed)
[place]. [facility]  Patient:    [name], [name]. Visit Number: [id number] MRN: [id number]          Service Type: Attending:  Geroge Baseman. Alphonzo Cruise, M.D. Dictated by:   Zenaida Deed, P.A. Adm. Date:  08/26/01                           History and Physical  DATE OF BIRTH: July 17, 1927  CHIEF COMPLAINT: A ten year history of left knee pain.  HISTORY OF PRESENT ILLNESS: This 77 year old white female patient presents to Dr. Alphonzo Cruise with a ten year history of gradual onset but progressively worsening left knee pain.  She has a history of a left knee arthroscopy by Dr. Alphonzo Cruise in 1997 in which he removed several large loose bodies from her knee and said she had significant osteoarthritis.  She has had no other injury or prior surgery to the knee, and the pain has been getting progressively worse.  At this point the pain is described as an intermittent throb or burn located diffusely about the knee with occasional radiation down into the ankle.  It increases with any prolonged weightbearing on the leg and the pain is pretty much constant if she is bearing weight on her leg.  The pain does decrease with rest and elevation.  She is currently taking Voltaren for pain and that provides a moderate amount of relief.  The knee does feel like it pops and slips at times but there is no swelling and only occasional night pain.  No grinding, locking, or giving way.  She has tried Hyalgan and cortisone shots in the past and that only provided relief for several weeks.  She is currently ambulating with the use of a cane, and has done so for the last six months.  ALLERGIES: No known drug allergies.  CURRENT MEDICATIONS:  1. Aspirin 325 mg one tablet p.o. q.d. (Last dose August 20, 2001).  2. Voltaren 50 mg one tablet p.o. b.i.d.  3. Glucosamine and chondroitin sulfate 900 mg one tablet p.o. b.i.d.  4. Lipitor 5 mg one tablet p.o. q.h.s.  5. Multivitamin one tablet  p.o. q.d.  6. Calcium + D 600 mg one tablet p.o. b.i.d.  7. Captopril 25 mg 1/2 tablet p.o. b.i.d.  8. Ferrous sulfate 325 mg one tablet p.o. b.i.d.  PAST MEDICAL HISTORY:  1. She has type 2 diabetes mellitus for the last three years, which is just     diet controlled.  She is on the Captopril just to help her kidney function     due to her diabetes.  2. She does have osteoarthritis in multiple joints.  3. Some benign positional vertigo, which was diagnosed approximately a month     ago.  This occurs mostly at night.  She denies any history of hypertension, thyroid disease, hiatal hernia, reflux, peptic ulcer disease, heart disease, asthma, or any other chronic medical condition other than noted previously.  PAST SURGICAL HISTORY:  1. Excision of a growth on her lumbar sacral spine by Dr. Stephenie Acres in February 1949.  2. Hysterectomy by Dr. Claudie Fisherman in April 1962.  3. Right open shoulder rotator cuff repair by Dr. Gildardo Cranker     February 16, 2000.  4. Left knee arthroscopy by Dr. Gildardo Cranker in October 1997.  5. Excision of right eye cataract in 1999 by Dr. Dolores Lory.  6. Release of left carpal  tunnel syndrome in January 2002 by Dr. Gildardo Cranker.  SOCIAL HISTORY: She denies any history of cigarette smoking, alcohol use, or drug use.  She is married and lives with her husband in a one-story house with three steps into the main entrance.  She is a retired housewife.  Her medical doctor is Dr. Carin Primrose in [place], [place].  The patient has three children and they are all alive and healthy.  FAMILY HISTORY: Her mother died at the age of 52 with congestive heart failure and hypertension.  Her father died at the age of 48 with diabetes mellitus and a stroke.  She has two brothers, one age 59 who is healthy, and one age 48 who has varicose veins and a history of phlebitis.  She has three sisters, all alive, a 60 year old and 71 year old with a  history of diabetes and a 75 year old with a history of breast cancer.  Her children are age 66, 36, and 56 and they are alive and healthy.  REVIEW OF SYSTEMS: She does have occasional sinus congestion which is treated with Tylenol Sinus.  That has also seemed to help her vertigo some.  She does have a left eye cataract.  She has some osteoarthritis of her cervical spine. She does complain of some urinary frequency, hesitancy, and nocturia.  She does have frequent constipation which she treats with an over-the-counter laxative.  She does wear glasses and she has partial dentures on both the upper and lower jaw line.  She does not have a Living Will nor a power of attorney.  All other systems are negative and noncontributory at this time.  PHYSICAL EXAMINATION:  GENERAL: Well-developed, well-nourished, overweight white female in no acute distress.  She walks with a limp on the left and a significant valgus deformity noted of the left leg.  She does walk with the use of a cane.  Mood and affect are appropriate.  Talks easily with the examiner.  VITAL SIGNS: Height 5 feet 1 inch.  Weight 182 pounds.  BMI 33.5.  TEMP 99.1 degrees Fahrenheit, pulse 92, respirations 24, BP 126/74.  HEENT: Normocephalic, atraumatic, without frontal or maxillary sinus tenderness to palpation.  Conjunctivae pink.  Sclerae anicteric.  PERRLA. EOMI.  Funduscopic examination shows visible red reflex bilaterally with normal retinal vasculature, optic disc and cup.  No visible external ear deformities.  Hearing grossly intact.  Tympanic membranes pearly gray bilaterally with good light reflex.  Nose and nasal septum midline.  Nasal mucosa pink and moist without exudate or polyps noted.  Buccal mucosa pink and moist.  Good dentition.  Pharynx without erythema or exudate.  Tongue and uvula are midline.  Tongue without fasciculations and uvula rises equally with phonation.  NECK: No visible masses or lesions noted.   Trachea midline.  No palpable lymphadenopathy or thyromegaly.  Carotids +2 bilaterally without bruits.  She  has slightly decreased range of motion of her cervical in all directions, but this seems equal on both sides and causes minimal pain.  CARDIOVASCULAR: Heart rate and rhythm regular.  S1 and S2 present without rubs, click, or murmurs noted.  RESPIRATORY: Respirations even and unlabored.  Breath sounds clear to auscultation bilaterally without rales or wheezes noted.  ABDOMEN: Large rounded abdominal contour.  Bowel sounds present x 4 quadrants. Soft, nontender to palpation, without hepatosplenomegaly or CVA tenderness. Femoral pulses +2 bilaterally.  Nontender to palpation along the entire length of the vertebral column.  BREAST/GU/RECTAL: These examinations are deferred  at this time.  MUSCULOSKELETAL: No obvious deformities of bilateral upper extremities, with decreased forward flexion of both shoulders and decreased external rotation on the right at the shoulder; otherwise normal muscle tone and bulk.  Radial pulses +2 bilaterally and otherwise full range of motion of the upper extremities other than noted previously.  She has full range of motion of her hips, ankles, and toes bilaterally.  DP and PT pulses are +2 bilaterally.  She has +1-2 pitting edema of bilateral lower extremities.  Right knee has full extension and flexion to about 115 degrees.  There is minimal crepitus with range of motion of the right knee.  She does have pain with palpation of both the medial and lateral knee joint.  Collateral ligaments are stable.  No effusion.  Left knee does appear to be in valgus position when she stands. She has full extension and flexion to only 90 degrees.  There is a moderate amount of crepitus with range of motion.  She does have a small effusion in the knee.  She has a lot more pain with palpation on the medial joint line than the lateral.  She does seem a bit more lax  with varus and valgus stress on the lateral side of the knee versus the medial.  NEUROLOGIC: Alert and oriented x 3.  Cranial nerves 2-12 grossly intact. Strength 5/5 in bilateral upper and lower extremities.  Rapid alternating movements intact.  Deep tendon reflexes 2+ in bilateral upper and lower extremities.  Sensation intact to light touch.  RADIOLOGIC FINDINGS: X-ray taken of her left knee in September 2002 showed a valgus deformity with partial collapse of the lateral compartment.  There are osteoarthritic changes noted both medially and laterally.  X-rays taken of the right knee at that time showed early osteoarthritis with less of a valgus deformity and less collapse.  IMPRESSION:  1. Osteoarthritis of bilateral knees, left worse than right.  2. Type 2 diabetes mellitus.  3. Benign positional vertigo.  4. Cataracts.  PLAN: Ms. Bueti will be admitted to [facility] on August 26, 2001, where she will undergo a left total knee arthroplasty by Dr. Gildardo Cranker.  She will undergo all the routine preoperative tests and studies prior to this procedure.  She has donated two units of autologous blood for surgery.  If we have any medical issues while she is hospitalized we will consult one of the hospitalists.    Dictated by:   Zenaida Deed, P.A.  Attending:  Geroge Baseman Alphonzo Cruise, M.D. DD:  08/22/01 TD:  08/23/01 Job: [id number]

## 2011-04-06 NOTE — Op Note (Signed)
[place]. [facility]  Patient:    [name], [name]                        MRN: [id number] Proc. Date: 12/10/00 Attending:  Barbaraann Rondo A. Alphonzo Cruise, M.D.                           Operative Report  PREOPERATIVE DIAGNOSIS:  Left carpal tunnel.  POSTOPERATIVE DIAGNOSIS:  Left carpal tunnel.  OPERATION PERFORMED:  Release of transverse carpal ligament and exploration of carpal tunnel.  SURGEON:  Geroge Baseman. Alphonzo Cruise, M.D.  ANESTHESIA:  MAC.  DESCRIPTION OF PROCEDURE:  The patient was placed on the operating table in supine position with the pneumatic tourniquet about the left upper arm.  The left upper extremity was prepped with DuraPrep and draped out in the usual manner.  The area of the incision was infiltrated with 1% local Xylocaine. The arm was then wrapped out with an Esmarch and the tourniquet was elevated. A lazy-S incision was made starting proximal to the volar wrist crease and carried out into the midpalmar space.  Skin edges were retracted.  The fascia in the forearm was opened and the median nerve was identified and isolated.  A curved Mayo scissor was placed underneath the transverse carpal ligament but above the median nerve to protect the median nerve.  The transverse carpal ligament was then released under direct visualization off the ulnar border of the carpal canal out in the midpalmar space.  Complete decompression of the entire carpal canal was achieved very nicely.  There was marked flattening of the nerve underneath the transverse carpal ligament and total loss of the vascular markings.  No other space occupying lesions were seen.  The wound was then bathed in Marcaine and the skin edges were closed with running locked 4-0 nylon suture.  A large bulky compressive dressing was applied.  The patient was transferred to the recovery room in excellent condition.  Technically, this procedure went extremely well.  DRAINS:   None.  COMPLICATIONS:  None. DD:  12/10/00 TD:  12/10/00 Job: [id number]

## 2011-04-06 NOTE — Assessment & Plan Note (Signed)
[place] OFFICE NOTE   NAME:[name], [name]                         MRN:          [id number]  DATE:10/17/2006                            DOB:          January 18, 1927    HISTORY OF PRESENT ILLNESS:  The patient is a 77 year old female with a  history of dyslipidemia and diet-controlled diabetes.  She has no known  coronary artery disease.  She has no known coronary artery disease.  On  the last office visit, she reported palpitations.  Cardiac monitor was  applied.  The patient had a single episode of PSVT at 145 beats per  minute but was not symptomatic with this.  Her metoprolol has been  changed to 25 mg p.o. b.i.d.  She states she is feeling quite well, and  she has no symptoms.   MEDICATIONS:  1. Zocor 10 mg, 1 a day.  2. Calcium 60 mg, 2 tablets q. day.  3. Vitamin q. day.  4. Chondroitin.  5. Glucosamine.  6. Aspirin 325 q. day.  7. Diclofenac.  8. Captopril 25 b.i.d.  9. Omeprazole.  10.Metoprolol 25 mg p.o. b.i.d.   PHYSICAL EXAMINATION:  VITAL SIGNS:  Blood pressure is 130/82, heart  rate 72 beats per minute.  NECK:  Normal carotid upstrokes.  No carotid bruits.  LUNGS:  Clear. Breath sounds positive.  HEART:  Regular rate and rhythm.  Normal sinus rhythm.  ABDOMEN:  Soft.  EXTREMITIES:  No cyanosis, clubbing or edema.  NEUROLOGIC:  Patient is alert, oriented.  Grossly nonfocal.   PROBLEMS:  1. Palpitations, resolved.  2. Substernal chest pain, resolved and negative catheterization.   PLAN:  The patient will continue her current medical regimen.  No  further cardiac workup appears to be indicated.  Will see the patient  back in 6 months and will follow her clinically for now.     Ernestine Mcmurray, MD,FACC  Electronically Signed   GED/MedQ  DD: 10/17/2006  DT: 10/18/2006  Job #: [id number]

## 2011-09-12 ENCOUNTER — Encounter: Payer: Self-pay | Admitting: Physician Assistant

## 2011-09-12 ENCOUNTER — Ambulatory Visit (INDEPENDENT_AMBULATORY_CARE_PROVIDER_SITE_OTHER): Payer: Medicare Other | Admitting: Physician Assistant

## 2011-09-12 DIAGNOSIS — G459 Transient cerebral ischemic attack, unspecified: Secondary | ICD-10-CM

## 2011-09-12 DIAGNOSIS — I10 Essential (primary) hypertension: Secondary | ICD-10-CM | POA: Insufficient documentation

## 2011-09-12 DIAGNOSIS — I498 Other specified cardiac arrhythmias: Secondary | ICD-10-CM

## 2011-09-12 NOTE — Patient Instructions (Signed)
Continue all current medications. Your physician wants you to follow up in: 6 months.  You will receive a reminder letter in the mail one-two months in advance.  If you don't receive a letter, please call our office to schedule the follow up appointment   

## 2011-09-12 NOTE — Assessment & Plan Note (Signed)
On full dose aspirin, followed by Dr. Quintin Alto

## 2011-09-12 NOTE — Assessment & Plan Note (Signed)
Quiescent on current medication regimen. No medication adjustments. Schedule return visit with Dr. Dannielle Burn in 6 months.

## 2011-09-12 NOTE — Assessment & Plan Note (Signed)
Followed by Dr. Sasser 

## 2011-09-12 NOTE — Progress Notes (Signed)
HPI: Patient returns for scheduled six-month visit  She presents with no interim development of exertional CP or SOB. Denies recurrent palpitations. Lives alone, but quite self-sufficient and continues to drive. Ambulates with a 4 point cane, has some gait instability, but denies any falls or syncope. Had some bruising of the right forearm 1 week ago, since resolved. Patient on high dose aspirin, which was uptitrated, by Dr. Carin Primrose, after suffering a TIA approximately 5 years ago.  Allergies  Allergen Reactions  . Food     SLENDA  . Toprol Xl (Metoprolol Succinate)     Current Outpatient Prescriptions on File Prior to Visit  Medication Sig Dispense Refill  . aspirin 325 MG tablet Take 325 mg by mouth daily.        Marland Kitchen atenolol (TENORMIN) 50 MG tablet Take 1 tablet (50 mg total) by mouth daily.  90 tablet  3  . Calcium Carbonate-Vitamin D (CALTRATE 600+D) 600-400 MG-UNIT per tablet Take 1 tablet by mouth 2 (two) times daily.        . captopril (CAPOTEN) 25 MG tablet Take 25 mg by mouth 2 (two) times daily.        . Multiple Vitamin (MULTIVITAMIN) tablet Take 1 tablet by mouth daily.        . simvastatin (ZOCOR) 10 MG tablet Take 10 mg by mouth at bedtime.          Past Medical History  Diagnosis Date  . Coronary artery disease     nonobstructive  . Diabetes mellitus     diet controlled  . Dyslipidemia     followed by primary care physician  . TIA (transient ischemic attack)   . History of paroxysmal supraventricular tachycardia     quiesced and on metoprolol  . False positive cardiac stress test     false positive adenosine Cardiolite  . Left ventricular dysfunction with preserved left ventricular function     No past surgical history on file.  History   Social History  . Marital Status: Widowed    Spouse Name: N/A    Number of Children: N/A  . Years of Education: N/A   Occupational History  . Not on file.   Social History Main Topics  . Smoking status: Never  Smoker   . Smokeless tobacco: Never Used  . Alcohol Use: No  . Drug Use: Not on file  . Sexually Active: Not on file   Other Topics Concern  . Not on file   Social History Narrative  . No narrative on file    Family History  Problem Relation Age of Onset  . Coronary artery disease Mother     ROS: Negative for exertional chest pain, DOE, orthopnea, PND, lower extremity edema, palpitations, presyncope/syncope, claudication, reflux, hematuria, hematochezia, or melena. Remaining systems reviewed, and are negative.   PHYSICAL EXAM:  BP 106/67  Pulse 60  Ht 5\' 1"  (1.549 m)  Wt 165 lb (74.844 kg)  BMI 31.18 kg/m2  GENERAL: 77 year old female, obese; NAD HEENT: NCAT, PERRLA, EOMI; sclera clear; no xanthelasma NECK: palpable bilateral carotid pulses, no bruits; no JVD; no TM LUNGS: CTA bilaterally CARDIAC: RRR (S1, S2); no significant murmurs; no rubs or gallops ABDOMEN: Protuberant  EXTREMETIES: 2-3+ bilateral peripheral edema, with CVI changes SKIN: warm/dry; no obvious rash/lesions MUSCULOSKELETAL: no joint deformity NEURO: no focal deficit; NL affect   EKG:    ASSESSMENT & PLAN:

## 2011-12-20 DIAGNOSIS — E119 Type 2 diabetes mellitus without complications: Secondary | ICD-10-CM | POA: Diagnosis not present

## 2012-03-11 ENCOUNTER — Encounter: Payer: Self-pay | Admitting: Cardiology

## 2012-03-11 ENCOUNTER — Ambulatory Visit (INDEPENDENT_AMBULATORY_CARE_PROVIDER_SITE_OTHER): Payer: Medicare Other | Admitting: Cardiology

## 2012-03-11 VITALS — BP 149/86 | HR 60 | Ht 61.0 in | Wt 165.0 lb

## 2012-03-11 DIAGNOSIS — I498 Other specified cardiac arrhythmias: Secondary | ICD-10-CM | POA: Diagnosis not present

## 2012-03-11 DIAGNOSIS — I471 Supraventricular tachycardia: Secondary | ICD-10-CM

## 2012-03-11 DIAGNOSIS — R079 Chest pain, unspecified: Secondary | ICD-10-CM

## 2012-03-11 DIAGNOSIS — I10 Essential (primary) hypertension: Secondary | ICD-10-CM | POA: Diagnosis not present

## 2012-03-11 NOTE — Patient Instructions (Signed)
Continue all current medications. Keep log of blood pressure readings and bring to office Cleared for surgery Your physician wants you to follow up in: 6 months.  You will receive a reminder letter in the mail one-two months in advance.  If you don't receive a letter, please call our office to schedule the follow up appointment

## 2012-03-13 ENCOUNTER — Encounter: Payer: Self-pay | Admitting: Cardiology

## 2012-03-23 NOTE — Progress Notes (Signed)
[name] Real, MD, [facility] ABIM Board Certified in Adult Cardiovascular Medicine,Internal Medicine and Critical Care Medicine    CC: Followup patient with a history of SVT.  HPI:  The patient is a 78 year old female with a history of supraventricular tachycardia and nonobstructive coronary artery disease.  From a cardiovascular standpoint she is doing well.  She denies any chest pain shortness of breath orthopnea or PND.  Chest had no clinical recurrence of SVT.  She denies any presyncope or syncope.  There is some question whether her blood pressure could be elevated.  PMH: reviewed and listed in Problem List in Electronic Records (and see below) Past Medical History  Diagnosis Date  . Coronary artery disease     nonobstructive  . Diabetes mellitus     diet controlled  . Dyslipidemia     followed by primary care physician  . TIA (transient ischemic attack)   . History of paroxysmal supraventricular tachycardia     quiesced and on metoprolol  . False positive cardiac stress test     false positive adenosine Cardiolite  . Left ventricular dysfunction with preserved left ventricular function    No past surgical history on file.  Allergies/SH/FHX : available in Electronic Records for review  Allergies  Allergen Reactions  . Toprol Xl (Metoprolol Succinate) Other (See Comments)    HAIR LOSS   History   Social History  . Marital Status: Widowed    Spouse Name: N/A    Number of Children: N/A  . Years of Education: N/A   Occupational History  . Not on file.   Social History Main Topics  . Smoking status: Never Smoker   . Smokeless tobacco: Never Used  . Alcohol Use: No  . Drug Use: Not on file  . Sexually Active: Not on file   Other Topics Concern  . Not on file   Social History Narrative  . No narrative on file   Family History  Problem Relation Age of Onset  . Coronary artery disease Mother     Medications: Current Outpatient Prescriptions  Medication Sig  Dispense Refill  . acetaminophen (TYLENOL ARTHRITIS PAIN) 650 MG CR tablet Take 1,300 mg by mouth 2 (two) times daily.        Marland Kitchen aspirin 325 MG tablet Take 325 mg by mouth daily.        Marland Kitchen atenolol (TENORMIN) 50 MG tablet Take 1 tablet (50 mg total) by mouth daily.  90 tablet  3  . Calcium Carbonate-Vitamin D (CALTRATE 600+D) 600-400 MG-UNIT per tablet Take 1 tablet by mouth 2 (two) times daily.        . captopril (CAPOTEN) 25 MG tablet Take 25 mg by mouth 2 (two) times daily.        . Multiple Vitamin (MULTIVITAMIN) tablet Take 1 tablet by mouth daily.        Marland Kitchen omeprazole (PRILOSEC OTC) 20 MG tablet Take 20 mg by mouth daily.        . simvastatin (ZOCOR) 10 MG tablet Take 10 mg by mouth at bedtime.          ROS: No nausea or vomiting. No fever or chills.No melena or hematochezia.No bleeding.No claudication  Physical Exam: BP 149/86  Pulse 60  Ht 5\' 1"  (1.549 m)  Wt 165 lb (74.844 kg)  BMI 31.18 kg/m2 General:Well-nourished female in no distress. Neck:Normal carotid upstroke and no carotid bruits.  No thyromegaly no nodular thyroid. Lungs:Clear breath sounds bilaterally no wheezing. Cardiac:Regular rate and  rhythm normal S1 and S2 and no murmur rubs or gallops Vascular:No edema.  Normal distal pulses Skin:Warm and dry Physcologic:Normal affect  12lead ECG:Not obtained Limited bedside ECHO:N/A No images are attached to the encounter.   Assessment and Plan  SUPRAVENTRICULAR TACHYCARDIA No recurrence.  Patient doing well.  Chest pain History of nonobstructive coronary artery disease after a false-positive adenosine Cardiolite study in 2007.  No recurrent chest pain.  Continue medical therapy  Hypertension Blood pressure elevated in the office..  Continue current medical regimen, But I asked the patient to keep a log of her blood pressure readings.    Patient Active Problem List  Diagnoses  . SUPRAVENTRICULAR TACHYCARDIA  . ALOPECIA  . SHOULDER PAIN, RIGHT  . Chest pain    . Hypertension  . TIA (transient ischemic attack)

## 2012-03-23 NOTE — Assessment & Plan Note (Signed)
History of nonobstructive coronary artery disease after a false-positive adenosine Cardiolite study in 2007.  No recurrent chest pain.  Continue medical therapy

## 2012-03-23 NOTE — Assessment & Plan Note (Signed)
No recurrence.  Patient doing well.

## 2012-03-23 NOTE — Assessment & Plan Note (Signed)
Blood pressure elevated in the office..  Continue current medical regimen, But I asked the patient to keep a log of her blood pressure readings.

## 2012-04-02 ENCOUNTER — Encounter: Payer: Self-pay | Admitting: *Deleted

## 2012-04-02 DIAGNOSIS — E119 Type 2 diabetes mellitus without complications: Secondary | ICD-10-CM | POA: Diagnosis not present

## 2012-04-02 DIAGNOSIS — I10 Essential (primary) hypertension: Secondary | ICD-10-CM | POA: Diagnosis not present

## 2012-04-02 DIAGNOSIS — M1990 Unspecified osteoarthritis, unspecified site: Secondary | ICD-10-CM | POA: Diagnosis not present

## 2012-04-02 DIAGNOSIS — E7800 Pure hypercholesterolemia, unspecified: Secondary | ICD-10-CM | POA: Diagnosis not present

## 2012-05-02 DIAGNOSIS — M1710 Unilateral primary osteoarthritis, unspecified knee: Secondary | ICD-10-CM | POA: Diagnosis not present

## 2012-05-06 DIAGNOSIS — M1710 Unilateral primary osteoarthritis, unspecified knee: Secondary | ICD-10-CM | POA: Diagnosis not present

## 2012-05-20 DIAGNOSIS — K219 Gastro-esophageal reflux disease without esophagitis: Secondary | ICD-10-CM | POA: Diagnosis present

## 2012-05-20 DIAGNOSIS — E119 Type 2 diabetes mellitus without complications: Secondary | ICD-10-CM | POA: Diagnosis not present

## 2012-05-20 DIAGNOSIS — Z79899 Other long term (current) drug therapy: Secondary | ICD-10-CM | POA: Diagnosis not present

## 2012-05-20 DIAGNOSIS — M810 Age-related osteoporosis without current pathological fracture: Secondary | ICD-10-CM | POA: Diagnosis not present

## 2012-05-20 DIAGNOSIS — Z471 Aftercare following joint replacement surgery: Secondary | ICD-10-CM | POA: Diagnosis not present

## 2012-05-20 DIAGNOSIS — D649 Anemia, unspecified: Secondary | ICD-10-CM | POA: Diagnosis not present

## 2012-05-20 DIAGNOSIS — I498 Other specified cardiac arrhythmias: Secondary | ICD-10-CM | POA: Diagnosis not present

## 2012-05-20 DIAGNOSIS — Z96659 Presence of unspecified artificial knee joint: Secondary | ICD-10-CM | POA: Diagnosis not present

## 2012-05-20 DIAGNOSIS — K5900 Constipation, unspecified: Secondary | ICD-10-CM | POA: Diagnosis not present

## 2012-05-20 DIAGNOSIS — G589 Mononeuropathy, unspecified: Secondary | ICD-10-CM | POA: Diagnosis not present

## 2012-05-20 DIAGNOSIS — D62 Acute posthemorrhagic anemia: Secondary | ICD-10-CM | POA: Diagnosis not present

## 2012-05-20 DIAGNOSIS — R52 Pain, unspecified: Secondary | ICD-10-CM | POA: Diagnosis not present

## 2012-05-20 DIAGNOSIS — IMO0002 Reserved for concepts with insufficient information to code with codable children: Secondary | ICD-10-CM | POA: Diagnosis not present

## 2012-05-20 DIAGNOSIS — R262 Difficulty in walking, not elsewhere classified: Secondary | ICD-10-CM | POA: Diagnosis not present

## 2012-05-20 DIAGNOSIS — M1990 Unspecified osteoarthritis, unspecified site: Secondary | ICD-10-CM | POA: Diagnosis not present

## 2012-05-20 DIAGNOSIS — Z7982 Long term (current) use of aspirin: Secondary | ICD-10-CM | POA: Diagnosis not present

## 2012-05-20 DIAGNOSIS — Z5189 Encounter for other specified aftercare: Secondary | ICD-10-CM | POA: Diagnosis not present

## 2012-05-20 DIAGNOSIS — N318 Other neuromuscular dysfunction of bladder: Secondary | ICD-10-CM | POA: Diagnosis not present

## 2012-05-20 DIAGNOSIS — R279 Unspecified lack of coordination: Secondary | ICD-10-CM | POA: Diagnosis not present

## 2012-05-20 DIAGNOSIS — E785 Hyperlipidemia, unspecified: Secondary | ICD-10-CM | POA: Diagnosis not present

## 2012-05-20 DIAGNOSIS — Z8673 Personal history of transient ischemic attack (TIA), and cerebral infarction without residual deficits: Secondary | ICD-10-CM | POA: Diagnosis not present

## 2012-05-20 DIAGNOSIS — M1710 Unilateral primary osteoarthritis, unspecified knee: Secondary | ICD-10-CM | POA: Diagnosis not present

## 2012-05-20 DIAGNOSIS — M6281 Muscle weakness (generalized): Secondary | ICD-10-CM | POA: Diagnosis not present

## 2012-05-20 DIAGNOSIS — I10 Essential (primary) hypertension: Secondary | ICD-10-CM | POA: Diagnosis not present

## 2012-05-20 DIAGNOSIS — I471 Supraventricular tachycardia: Secondary | ICD-10-CM | POA: Diagnosis not present

## 2012-05-23 DIAGNOSIS — Z96659 Presence of unspecified artificial knee joint: Secondary | ICD-10-CM | POA: Diagnosis not present

## 2012-05-23 DIAGNOSIS — G589 Mononeuropathy, unspecified: Secondary | ICD-10-CM | POA: Diagnosis not present

## 2012-05-23 DIAGNOSIS — Z5189 Encounter for other specified aftercare: Secondary | ICD-10-CM | POA: Diagnosis not present

## 2012-05-23 DIAGNOSIS — I471 Supraventricular tachycardia: Secondary | ICD-10-CM | POA: Diagnosis not present

## 2012-05-23 DIAGNOSIS — Z471 Aftercare following joint replacement surgery: Secondary | ICD-10-CM | POA: Diagnosis not present

## 2012-05-23 DIAGNOSIS — D649 Anemia, unspecified: Secondary | ICD-10-CM | POA: Diagnosis not present

## 2012-05-23 DIAGNOSIS — E785 Hyperlipidemia, unspecified: Secondary | ICD-10-CM | POA: Diagnosis not present

## 2012-05-23 DIAGNOSIS — M1710 Unilateral primary osteoarthritis, unspecified knee: Secondary | ICD-10-CM | POA: Diagnosis not present

## 2012-05-23 DIAGNOSIS — I10 Essential (primary) hypertension: Secondary | ICD-10-CM | POA: Diagnosis not present

## 2012-05-23 DIAGNOSIS — K219 Gastro-esophageal reflux disease without esophagitis: Secondary | ICD-10-CM | POA: Diagnosis not present

## 2012-05-23 DIAGNOSIS — M6281 Muscle weakness (generalized): Secondary | ICD-10-CM | POA: Diagnosis not present

## 2012-05-23 DIAGNOSIS — M1990 Unspecified osteoarthritis, unspecified site: Secondary | ICD-10-CM | POA: Diagnosis not present

## 2012-05-23 DIAGNOSIS — R262 Difficulty in walking, not elsewhere classified: Secondary | ICD-10-CM | POA: Diagnosis not present

## 2012-05-23 DIAGNOSIS — R279 Unspecified lack of coordination: Secondary | ICD-10-CM | POA: Diagnosis not present

## 2012-05-23 DIAGNOSIS — R52 Pain, unspecified: Secondary | ICD-10-CM | POA: Diagnosis not present

## 2012-05-23 DIAGNOSIS — N318 Other neuromuscular dysfunction of bladder: Secondary | ICD-10-CM | POA: Diagnosis not present

## 2012-05-23 DIAGNOSIS — K5900 Constipation, unspecified: Secondary | ICD-10-CM | POA: Diagnosis not present

## 2012-05-26 DIAGNOSIS — Z96659 Presence of unspecified artificial knee joint: Secondary | ICD-10-CM | POA: Diagnosis not present

## 2012-05-26 DIAGNOSIS — M1710 Unilateral primary osteoarthritis, unspecified knee: Secondary | ICD-10-CM | POA: Diagnosis not present

## 2012-06-15 DIAGNOSIS — M1990 Unspecified osteoarthritis, unspecified site: Secondary | ICD-10-CM | POA: Diagnosis not present

## 2012-06-21 DIAGNOSIS — Z96659 Presence of unspecified artificial knee joint: Secondary | ICD-10-CM | POA: Diagnosis not present

## 2012-06-21 DIAGNOSIS — Z471 Aftercare following joint replacement surgery: Secondary | ICD-10-CM | POA: Diagnosis not present

## 2012-06-21 DIAGNOSIS — I10 Essential (primary) hypertension: Secondary | ICD-10-CM | POA: Diagnosis not present

## 2012-06-23 DIAGNOSIS — Z96659 Presence of unspecified artificial knee joint: Secondary | ICD-10-CM | POA: Diagnosis not present

## 2012-06-23 DIAGNOSIS — I10 Essential (primary) hypertension: Secondary | ICD-10-CM | POA: Diagnosis not present

## 2012-06-23 DIAGNOSIS — Z471 Aftercare following joint replacement surgery: Secondary | ICD-10-CM | POA: Diagnosis not present

## 2012-06-24 DIAGNOSIS — Z471 Aftercare following joint replacement surgery: Secondary | ICD-10-CM | POA: Diagnosis not present

## 2012-06-24 DIAGNOSIS — I10 Essential (primary) hypertension: Secondary | ICD-10-CM | POA: Diagnosis not present

## 2012-06-24 DIAGNOSIS — Z96659 Presence of unspecified artificial knee joint: Secondary | ICD-10-CM | POA: Diagnosis not present

## 2012-06-25 DIAGNOSIS — Z471 Aftercare following joint replacement surgery: Secondary | ICD-10-CM | POA: Diagnosis not present

## 2012-06-25 DIAGNOSIS — I10 Essential (primary) hypertension: Secondary | ICD-10-CM | POA: Diagnosis not present

## 2012-06-25 DIAGNOSIS — Z96659 Presence of unspecified artificial knee joint: Secondary | ICD-10-CM | POA: Diagnosis not present

## 2012-06-26 DIAGNOSIS — Z96659 Presence of unspecified artificial knee joint: Secondary | ICD-10-CM | POA: Diagnosis not present

## 2012-06-26 DIAGNOSIS — Z471 Aftercare following joint replacement surgery: Secondary | ICD-10-CM | POA: Diagnosis not present

## 2012-06-26 DIAGNOSIS — I10 Essential (primary) hypertension: Secondary | ICD-10-CM | POA: Diagnosis not present

## 2012-06-30 DIAGNOSIS — Z471 Aftercare following joint replacement surgery: Secondary | ICD-10-CM | POA: Diagnosis not present

## 2012-06-30 DIAGNOSIS — Z96659 Presence of unspecified artificial knee joint: Secondary | ICD-10-CM | POA: Diagnosis not present

## 2012-06-30 DIAGNOSIS — I10 Essential (primary) hypertension: Secondary | ICD-10-CM | POA: Diagnosis not present

## 2012-07-02 DIAGNOSIS — I10 Essential (primary) hypertension: Secondary | ICD-10-CM | POA: Diagnosis not present

## 2012-07-02 DIAGNOSIS — Z471 Aftercare following joint replacement surgery: Secondary | ICD-10-CM | POA: Diagnosis not present

## 2012-07-02 DIAGNOSIS — Z96659 Presence of unspecified artificial knee joint: Secondary | ICD-10-CM | POA: Diagnosis not present

## 2012-07-04 DIAGNOSIS — I10 Essential (primary) hypertension: Secondary | ICD-10-CM | POA: Diagnosis not present

## 2012-07-04 DIAGNOSIS — Z471 Aftercare following joint replacement surgery: Secondary | ICD-10-CM | POA: Diagnosis not present

## 2012-07-04 DIAGNOSIS — Z96659 Presence of unspecified artificial knee joint: Secondary | ICD-10-CM | POA: Diagnosis not present

## 2012-07-07 DIAGNOSIS — Z96659 Presence of unspecified artificial knee joint: Secondary | ICD-10-CM | POA: Diagnosis not present

## 2012-07-07 DIAGNOSIS — I10 Essential (primary) hypertension: Secondary | ICD-10-CM | POA: Diagnosis not present

## 2012-07-07 DIAGNOSIS — Z471 Aftercare following joint replacement surgery: Secondary | ICD-10-CM | POA: Diagnosis not present

## 2012-07-09 DIAGNOSIS — Z96659 Presence of unspecified artificial knee joint: Secondary | ICD-10-CM | POA: Diagnosis not present

## 2012-07-09 DIAGNOSIS — Z471 Aftercare following joint replacement surgery: Secondary | ICD-10-CM | POA: Diagnosis not present

## 2012-07-09 DIAGNOSIS — I10 Essential (primary) hypertension: Secondary | ICD-10-CM | POA: Diagnosis not present

## 2012-07-11 DIAGNOSIS — Z96659 Presence of unspecified artificial knee joint: Secondary | ICD-10-CM | POA: Diagnosis not present

## 2012-07-11 DIAGNOSIS — Z471 Aftercare following joint replacement surgery: Secondary | ICD-10-CM | POA: Diagnosis not present

## 2012-07-11 DIAGNOSIS — I10 Essential (primary) hypertension: Secondary | ICD-10-CM | POA: Diagnosis not present

## 2012-08-28 DIAGNOSIS — Z23 Encounter for immunization: Secondary | ICD-10-CM | POA: Diagnosis not present

## 2012-08-28 DIAGNOSIS — Z1231 Encounter for screening mammogram for malignant neoplasm of breast: Secondary | ICD-10-CM | POA: Diagnosis not present

## 2012-09-12 ENCOUNTER — Ambulatory Visit: Payer: Medicare Other | Admitting: Cardiology

## 2012-09-23 DIAGNOSIS — Z01419 Encounter for gynecological examination (general) (routine) without abnormal findings: Secondary | ICD-10-CM | POA: Diagnosis not present

## 2012-09-23 DIAGNOSIS — N8110 Cystocele, unspecified: Secondary | ICD-10-CM | POA: Diagnosis not present

## 2012-09-23 DIAGNOSIS — E785 Hyperlipidemia, unspecified: Secondary | ICD-10-CM | POA: Diagnosis not present

## 2012-09-23 DIAGNOSIS — I10 Essential (primary) hypertension: Secondary | ICD-10-CM | POA: Diagnosis not present

## 2012-09-26 DIAGNOSIS — E119 Type 2 diabetes mellitus without complications: Secondary | ICD-10-CM | POA: Diagnosis not present

## 2012-09-26 DIAGNOSIS — E7800 Pure hypercholesterolemia, unspecified: Secondary | ICD-10-CM | POA: Diagnosis not present

## 2012-09-26 DIAGNOSIS — I10 Essential (primary) hypertension: Secondary | ICD-10-CM | POA: Diagnosis not present

## 2012-09-26 DIAGNOSIS — M1990 Unspecified osteoarthritis, unspecified site: Secondary | ICD-10-CM | POA: Diagnosis not present

## 2012-10-02 DIAGNOSIS — M1990 Unspecified osteoarthritis, unspecified site: Secondary | ICD-10-CM | POA: Diagnosis not present

## 2012-10-02 DIAGNOSIS — E7800 Pure hypercholesterolemia, unspecified: Secondary | ICD-10-CM | POA: Diagnosis not present

## 2012-10-02 DIAGNOSIS — I10 Essential (primary) hypertension: Secondary | ICD-10-CM | POA: Diagnosis not present

## 2012-10-02 DIAGNOSIS — E669 Obesity, unspecified: Secondary | ICD-10-CM | POA: Diagnosis not present

## 2012-10-02 DIAGNOSIS — E119 Type 2 diabetes mellitus without complications: Secondary | ICD-10-CM | POA: Diagnosis not present

## 2012-10-13 ENCOUNTER — Encounter: Payer: Self-pay | Admitting: Cardiology

## 2012-10-13 ENCOUNTER — Ambulatory Visit (INDEPENDENT_AMBULATORY_CARE_PROVIDER_SITE_OTHER): Payer: Medicare Other | Admitting: Cardiology

## 2012-10-13 VITALS — BP 124/67 | HR 64 | Ht 60.0 in | Wt 161.8 lb

## 2012-10-13 DIAGNOSIS — I10 Essential (primary) hypertension: Secondary | ICD-10-CM | POA: Diagnosis not present

## 2012-10-13 DIAGNOSIS — I498 Other specified cardiac arrhythmias: Secondary | ICD-10-CM | POA: Diagnosis not present

## 2012-10-13 DIAGNOSIS — I2510 Atherosclerotic heart disease of native coronary artery without angina pectoris: Secondary | ICD-10-CM | POA: Insufficient documentation

## 2012-10-13 NOTE — Assessment & Plan Note (Signed)
History of minimal coronary atherosclerosis at catheterization in 2007, no active angina symptoms. She continues on aspirin and statin therapy.

## 2012-10-13 NOTE — Assessment & Plan Note (Signed)
Quiescent on current medical regimen. Continue observation. No changes made.

## 2012-10-13 NOTE — Assessment & Plan Note (Signed)
Blood pressure looks good today. 

## 2012-10-13 NOTE — Progress Notes (Signed)
   Clinical Summary [name] is a 78 y.o.female presenting for followup. She is a former patient of Dr. Dannielle Burn, prefers to stay with [facility]. She was last seen in May.  Record review finds cardiac catheterization in 2007 demonstrating only minimal nonobstructive coronary atherosclerosis. She has a history of PSVT, and this has been well controlled on atenolol. She has not required any recent dose modification. Had to be taken off of Toprol-XL in the past secondary to alopecia.  She underwent right knee replacement earlier in the year, tolerated this well from a cardiac perspective.   Allergies  Allergen Reactions  . Toprol Xl (Metoprolol Succinate) Other (See Comments)    HAIR LOSS    Current Outpatient Prescriptions  Medication Sig Dispense Refill  . acetaminophen (TYLENOL ARTHRITIS PAIN) 650 MG CR tablet Take 1,300 mg by mouth 2 (two) times daily.        Marland Kitchen aspirin 81 MG tablet Take 162 mg by mouth at bedtime.      Marland Kitchen atenolol (TENORMIN) 50 MG tablet Take 1 tablet (50 mg total) by mouth daily.  90 tablet  3  . Calcium Carbonate-Vitamin D (CALTRATE 600+D) 600-400 MG-UNIT per tablet Take 1 tablet by mouth 2 (two) times daily.        . captopril (CAPOTEN) 25 MG tablet Take 25 mg by mouth 2 (two) times daily.        . Multiple Vitamin (MULTIVITAMIN) tablet Take 1 tablet by mouth daily.        Marland Kitchen omeprazole (PRILOSEC OTC) 20 MG tablet Take 20 mg by mouth daily.        . simvastatin (ZOCOR) 10 MG tablet Take 10 mg by mouth at bedtime.        . nitroGLYCERIN (NITROSTAT) 0.4 MG SL tablet Place 0.4 mg under the tongue every 5 (five) minutes as needed.        Past Medical History  Diagnosis Date  . Coronary atherosclerosis of native coronary artery     Nonobstructive  . Type 2 diabetes mellitus     Diet controlled  . Dyslipidemia   . TIA (transient ischemic attack)   . PSVT (paroxysmal supraventricular tachycardia)     Social History Ms. Kappes reports that she has never smoked. She has  never used smokeless tobacco. Ms. Singelton reports that she does not drink alcohol.  Review of Systems No palpitations or syncope. Uses a cane to steady herself, ambulates fairly well. Stable appetite. Otherwise negative.  Physical Examination Filed Vitals:   10/13/12 1332  BP: 124/67  Pulse: 64   Filed Weights   10/13/12 1332  Weight: 161 lb 12.8 oz (73.392 kg)   No acute distress. HEENT: Conjunctiva and lids normal, oropharynx clear. Neck: Supple, no elevated JVP or carotid bruits, no thyromegaly. Lungs: Clear to auscultation, nonlabored breathing at rest. Cardiac: Regular rate and rhythm, no S3 or significant systolic murmur. Abdomen: Soft, nontender, bowel sounds present. Extremities: No pitting edema, distal pulses 2+.   Problem List and Plan   SUPRAVENTRICULAR TACHYCARDIA Quiescent on current medical regimen. Continue observation. No changes made.  Coronary atherosclerosis of native coronary artery History of minimal coronary atherosclerosis at catheterization in 2007, no active angina symptoms. She continues on aspirin and statin therapy.  Essential hypertension, benign Blood pressure looks good today.    Satira Sark, M.D., F.A.C.C.

## 2012-10-13 NOTE — Patient Instructions (Addendum)

## 2012-10-27 DIAGNOSIS — Z96659 Presence of unspecified artificial knee joint: Secondary | ICD-10-CM | POA: Diagnosis not present

## 2013-01-27 DIAGNOSIS — E119 Type 2 diabetes mellitus without complications: Secondary | ICD-10-CM | POA: Diagnosis not present

## 2013-03-19 DIAGNOSIS — E7800 Pure hypercholesterolemia, unspecified: Secondary | ICD-10-CM | POA: Diagnosis not present

## 2013-03-19 DIAGNOSIS — I10 Essential (primary) hypertension: Secondary | ICD-10-CM | POA: Diagnosis not present

## 2013-03-19 DIAGNOSIS — N8110 Cystocele, unspecified: Secondary | ICD-10-CM | POA: Diagnosis not present

## 2013-03-19 DIAGNOSIS — E119 Type 2 diabetes mellitus without complications: Secondary | ICD-10-CM | POA: Diagnosis not present

## 2013-03-24 DIAGNOSIS — E669 Obesity, unspecified: Secondary | ICD-10-CM | POA: Diagnosis not present

## 2013-03-24 DIAGNOSIS — I10 Essential (primary) hypertension: Secondary | ICD-10-CM | POA: Diagnosis not present

## 2013-03-24 DIAGNOSIS — E7800 Pure hypercholesterolemia, unspecified: Secondary | ICD-10-CM | POA: Diagnosis not present

## 2013-03-24 DIAGNOSIS — M1990 Unspecified osteoarthritis, unspecified site: Secondary | ICD-10-CM | POA: Diagnosis not present

## 2013-04-22 ENCOUNTER — Encounter: Payer: Self-pay | Admitting: Cardiology

## 2013-04-22 ENCOUNTER — Ambulatory Visit (INDEPENDENT_AMBULATORY_CARE_PROVIDER_SITE_OTHER): Payer: Medicare Other | Admitting: Cardiology

## 2013-04-22 VITALS — BP 121/62 | HR 59 | Ht 60.0 in | Wt 165.0 lb

## 2013-04-22 DIAGNOSIS — I2510 Atherosclerotic heart disease of native coronary artery without angina pectoris: Secondary | ICD-10-CM

## 2013-04-22 DIAGNOSIS — I498 Other specified cardiac arrhythmias: Secondary | ICD-10-CM | POA: Diagnosis not present

## 2013-04-22 NOTE — Assessment & Plan Note (Signed)
No active angina symptoms with prior documentation of nonobstructive disease. She continues on aspirin and statin therapy.

## 2013-04-22 NOTE — Progress Notes (Signed)
   Clinical Summary Ms. Olaes is an 79 y.o.female last seen in November 2013. She reports no chest pain symptoms with activity, has intermittent palpitations, but nothing prolonged. She has had no cardiac hospitalizations. Medications are outlined below. She continues on Tenormin without any major difficulties.  ECG today shows sinus bradycardia.   Allergies  Allergen Reactions  . Toprol Xl (Metoprolol Succinate) Other (See Comments)    HAIR LOSS    Current Outpatient Prescriptions  Medication Sig Dispense Refill  . acetaminophen (TYLENOL) 500 MG tablet Take 1,000 mg by mouth 2 (two) times daily.      Marland Kitchen aspirin 81 MG tablet Take 162 mg by mouth at bedtime.      Marland Kitchen atenolol (TENORMIN) 50 MG tablet Take 1 tablet (50 mg total) by mouth daily.  90 tablet  3  . Calcium Carbonate-Vitamin D (CALTRATE 600+D) 600-400 MG-UNIT per tablet Take 1 tablet by mouth 2 (two) times daily.        . captopril (CAPOTEN) 25 MG tablet Take 25 mg by mouth 2 (two) times daily.        . metFORMIN (GLUCOPHAGE-XR) 500 MG 24 hr tablet Take 1 tablet by mouth daily.      . Multiple Vitamin (MULTIVITAMIN) tablet Take 1 tablet by mouth daily.        . nitroGLYCERIN (NITROSTAT) 0.4 MG SL tablet Place 0.4 mg under the tongue every 5 (five) minutes as needed.      Marland Kitchen omeprazole (PRILOSEC OTC) 20 MG tablet Take 20 mg by mouth daily.        . simvastatin (ZOCOR) 10 MG tablet Take 10 mg by mouth at bedtime.         No current facility-administered medications for this visit.    Past Medical History  Diagnosis Date  . Coronary atherosclerosis of native coronary artery     Nonobstructive  . Type 2 diabetes mellitus     Diet controlled  . Dyslipidemia   . TIA (transient ischemic attack)   . PSVT (paroxysmal supraventricular tachycardia)     Social History Ms. Heydt reports that she has never smoked. She has never used smokeless tobacco. Ms. Melgarejo reports that she does not drink alcohol.  Review of Systems Negative  except as outlined.  Physical Examination Filed Vitals:   04/22/13 1319  BP: 121/62  Pulse: 59   Filed Weights   04/22/13 1319  Weight: 165 lb (74.844 kg)    No acute distress.  HEENT: Conjunctiva and lids normal, oropharynx clear.  Neck: Supple, no elevated JVP or carotid bruits, no thyromegaly.  Lungs: Clear to auscultation, nonlabored breathing at rest.  Cardiac: Regular rate and rhythm, no S3 or significant systolic murmur.  Abdomen: Soft, nontender, bowel sounds present.  Extremities: No pitting edema, distal pulses 2+.    Problem List and Plan   SUPRAVENTRICULAR TACHYCARDIA No progression in symptoms. Continue beta blocker and observation.  Coronary atherosclerosis of native coronary artery No active angina symptoms with prior documentation of nonobstructive disease. She continues on aspirin and statin therapy.    Satira Sark, M.D., F.A.C.C.

## 2013-04-22 NOTE — Assessment & Plan Note (Signed)
No progression in symptoms. Continue beta blocker and observation.

## 2013-04-22 NOTE — Patient Instructions (Addendum)

## 2013-05-26 DIAGNOSIS — Z96659 Presence of unspecified artificial knee joint: Secondary | ICD-10-CM | POA: Diagnosis not present

## 2013-06-08 DIAGNOSIS — H903 Sensorineural hearing loss, bilateral: Secondary | ICD-10-CM | POA: Diagnosis not present

## 2013-07-15 DIAGNOSIS — K2100 Gastro-esophageal reflux disease with esophagitis, without bleeding: Secondary | ICD-10-CM | POA: Insufficient documentation

## 2013-07-21 DIAGNOSIS — E119 Type 2 diabetes mellitus without complications: Secondary | ICD-10-CM | POA: Diagnosis not present

## 2013-07-21 DIAGNOSIS — M1990 Unspecified osteoarthritis, unspecified site: Secondary | ICD-10-CM | POA: Diagnosis not present

## 2013-07-21 DIAGNOSIS — E7800 Pure hypercholesterolemia, unspecified: Secondary | ICD-10-CM | POA: Diagnosis not present

## 2013-07-21 DIAGNOSIS — I10 Essential (primary) hypertension: Secondary | ICD-10-CM | POA: Diagnosis not present

## 2013-07-21 DIAGNOSIS — E669 Obesity, unspecified: Secondary | ICD-10-CM | POA: Diagnosis not present

## 2013-07-24 DIAGNOSIS — Z23 Encounter for immunization: Secondary | ICD-10-CM | POA: Diagnosis not present

## 2013-07-24 DIAGNOSIS — I10 Essential (primary) hypertension: Secondary | ICD-10-CM | POA: Diagnosis not present

## 2013-07-24 DIAGNOSIS — E7800 Pure hypercholesterolemia, unspecified: Secondary | ICD-10-CM | POA: Diagnosis not present

## 2013-07-24 DIAGNOSIS — M1990 Unspecified osteoarthritis, unspecified site: Secondary | ICD-10-CM | POA: Insufficient documentation

## 2013-07-24 DIAGNOSIS — E669 Obesity, unspecified: Secondary | ICD-10-CM | POA: Insufficient documentation

## 2013-08-27 DIAGNOSIS — Z23 Encounter for immunization: Secondary | ICD-10-CM | POA: Diagnosis not present

## 2013-08-31 DIAGNOSIS — Z1231 Encounter for screening mammogram for malignant neoplasm of breast: Secondary | ICD-10-CM | POA: Diagnosis not present

## 2013-11-18 DIAGNOSIS — E7800 Pure hypercholesterolemia, unspecified: Secondary | ICD-10-CM | POA: Diagnosis not present

## 2013-11-18 DIAGNOSIS — I10 Essential (primary) hypertension: Secondary | ICD-10-CM | POA: Diagnosis not present

## 2013-11-23 ENCOUNTER — Encounter: Payer: Self-pay | Admitting: Cardiology

## 2013-11-23 ENCOUNTER — Ambulatory Visit (INDEPENDENT_AMBULATORY_CARE_PROVIDER_SITE_OTHER): Payer: Medicare Other | Admitting: Cardiology

## 2013-11-23 VITALS — BP 141/74 | HR 56 | Ht 60.0 in | Wt 165.0 lb

## 2013-11-23 DIAGNOSIS — I498 Other specified cardiac arrhythmias: Secondary | ICD-10-CM

## 2013-11-23 DIAGNOSIS — I2510 Atherosclerotic heart disease of native coronary artery without angina pectoris: Secondary | ICD-10-CM

## 2013-11-23 NOTE — Progress Notes (Signed)
    Clinical Summary [name] is an 80 y.o.female last seen in June 2014. She continues to do very well on atenolol, has had no palpitations or chest pain. She remains active with her ADLs, enjoys knitting. We discussed PSVT, and our plan will be observation for now, no other changes unless she has progressive symptoms. She will be seeing Dr. Quintin Alto soon for a routine visit.   Allergies  Allergen Reactions  . Toprol Xl [Metoprolol Succinate] Other (See Comments)    HAIR LOSS    Current Outpatient Prescriptions  Medication Sig Dispense Refill  . acetaminophen (TYLENOL) 500 MG tablet Take 1,000 mg by mouth 2 (two) times daily.      Marland Kitchen aspirin 81 MG tablet Take 162 mg by mouth at bedtime.      Marland Kitchen atenolol (TENORMIN) 50 MG tablet Take 1 tablet (50 mg total) by mouth daily.  90 tablet  3  . Calcium Carbonate-Vitamin D (CALTRATE 600+D) 600-400 MG-UNIT per tablet Take 1 tablet by mouth 2 (two) times daily.        . captopril (CAPOTEN) 25 MG tablet Take 25 mg by mouth 2 (two) times daily.        . metFORMIN (GLUCOPHAGE-XR) 500 MG 24 hr tablet Take 1 tablet by mouth daily.      . Multiple Vitamin (MULTIVITAMIN) tablet Take 1 tablet by mouth daily.        . nitroGLYCERIN (NITROSTAT) 0.4 MG SL tablet Place 0.4 mg under the tongue every 5 (five) minutes as needed.      Marland Kitchen omeprazole (PRILOSEC OTC) 20 MG tablet Take 20 mg by mouth daily.        . simvastatin (ZOCOR) 10 MG tablet Take 10 mg by mouth at bedtime.         No current facility-administered medications for this visit.    Past Medical History  Diagnosis Date  . Coronary atherosclerosis of native coronary artery     Nonobstructive  . Type 2 diabetes mellitus     Diet controlled  . Dyslipidemia   . TIA (transient ischemic attack)   . PSVT (paroxysmal supraventricular tachycardia)     Social History Ms. Liberatore reports that she has never smoked. She has never used smokeless tobacco. Ms. Arevalos reports that she does not drink  alcohol.  Review of Systems Arthritic pains. Otherwise negative.  Physical Examination Filed Vitals:   11/23/13 1249  BP: 141/74  Pulse: 56   Filed Weights   11/23/13 1249  Weight: 165 lb (74.844 kg)    No acute distress.  HEENT: Conjunctiva and lids normal, oropharynx clear.  Neck: Supple, no elevated JVP or carotid bruits, no thyromegaly.  Lungs: Clear to auscultation, nonlabored breathing at rest.  Cardiac: Regular rate and rhythm, no S3 or significant systolic murmur.  Abdomen: Soft, nontender, bowel sounds present.  Extremities: No pitting edema, distal pulses 2+.    Problem List and Plan   SUPRAVENTRICULAR TACHYCARDIA Symptomatically stable, well controlled on atenolol. Will go to a one-year followup, sooner if needed.  Coronary atherosclerosis of native coronary artery No active angina symptoms with history of nonobstructive disease. She continues on aspirin and statin therapy.    Satira Sark, M.D., F.A.C.C.

## 2013-11-23 NOTE — Assessment & Plan Note (Signed)
No active angina symptoms with history of nonobstructive disease. She continues on aspirin and statin therapy.

## 2013-11-23 NOTE — Assessment & Plan Note (Signed)
Symptomatically stable, well controlled on atenolol. Will go to a one-year followup, sooner if needed.

## 2013-11-23 NOTE — Patient Instructions (Signed)

## 2013-11-26 DIAGNOSIS — E7800 Pure hypercholesterolemia, unspecified: Secondary | ICD-10-CM | POA: Diagnosis not present

## 2013-11-26 DIAGNOSIS — Z23 Encounter for immunization: Secondary | ICD-10-CM | POA: Diagnosis not present

## 2013-11-26 DIAGNOSIS — IMO0001 Reserved for inherently not codable concepts without codable children: Secondary | ICD-10-CM | POA: Diagnosis not present

## 2013-11-26 DIAGNOSIS — E669 Obesity, unspecified: Secondary | ICD-10-CM | POA: Diagnosis not present

## 2013-11-26 DIAGNOSIS — M1990 Unspecified osteoarthritis, unspecified site: Secondary | ICD-10-CM | POA: Diagnosis not present

## 2013-11-26 DIAGNOSIS — I10 Essential (primary) hypertension: Secondary | ICD-10-CM | POA: Diagnosis not present

## 2014-02-24 DIAGNOSIS — E119 Type 2 diabetes mellitus without complications: Secondary | ICD-10-CM | POA: Diagnosis not present

## 2014-03-23 DIAGNOSIS — I10 Essential (primary) hypertension: Secondary | ICD-10-CM | POA: Diagnosis not present

## 2014-03-23 DIAGNOSIS — IMO0001 Reserved for inherently not codable concepts without codable children: Secondary | ICD-10-CM | POA: Diagnosis not present

## 2014-03-23 DIAGNOSIS — E7800 Pure hypercholesterolemia, unspecified: Secondary | ICD-10-CM | POA: Diagnosis not present

## 2014-03-29 DIAGNOSIS — N189 Chronic kidney disease, unspecified: Secondary | ICD-10-CM | POA: Diagnosis not present

## 2014-03-29 DIAGNOSIS — E1129 Type 2 diabetes mellitus with other diabetic kidney complication: Secondary | ICD-10-CM | POA: Diagnosis not present

## 2014-03-29 DIAGNOSIS — I10 Essential (primary) hypertension: Secondary | ICD-10-CM | POA: Diagnosis not present

## 2014-03-29 DIAGNOSIS — E669 Obesity, unspecified: Secondary | ICD-10-CM | POA: Diagnosis not present

## 2014-03-29 DIAGNOSIS — M1990 Unspecified osteoarthritis, unspecified site: Secondary | ICD-10-CM | POA: Diagnosis not present

## 2014-03-29 DIAGNOSIS — E7800 Pure hypercholesterolemia, unspecified: Secondary | ICD-10-CM | POA: Diagnosis not present

## 2014-05-24 DIAGNOSIS — M1990 Unspecified osteoarthritis, unspecified site: Secondary | ICD-10-CM | POA: Diagnosis present

## 2014-05-24 DIAGNOSIS — Z79899 Other long term (current) drug therapy: Secondary | ICD-10-CM | POA: Diagnosis not present

## 2014-05-24 DIAGNOSIS — K219 Gastro-esophageal reflux disease without esophagitis: Secondary | ICD-10-CM | POA: Diagnosis not present

## 2014-05-24 DIAGNOSIS — D7289 Other specified disorders of white blood cells: Secondary | ICD-10-CM | POA: Diagnosis not present

## 2014-05-24 DIAGNOSIS — K5289 Other specified noninfective gastroenteritis and colitis: Secondary | ICD-10-CM | POA: Diagnosis not present

## 2014-05-24 DIAGNOSIS — Z8673 Personal history of transient ischemic attack (TIA), and cerebral infarction without residual deficits: Secondary | ICD-10-CM | POA: Diagnosis not present

## 2014-05-24 DIAGNOSIS — M810 Age-related osteoporosis without current pathological fracture: Secondary | ICD-10-CM | POA: Diagnosis present

## 2014-05-24 DIAGNOSIS — Z7982 Long term (current) use of aspirin: Secondary | ICD-10-CM | POA: Diagnosis not present

## 2014-05-24 DIAGNOSIS — D72829 Elevated white blood cell count, unspecified: Secondary | ICD-10-CM | POA: Diagnosis not present

## 2014-05-24 DIAGNOSIS — I10 Essential (primary) hypertension: Secondary | ICD-10-CM | POA: Diagnosis present

## 2014-05-24 DIAGNOSIS — K55059 Acute (reversible) ischemia of intestine, part and extent unspecified: Secondary | ICD-10-CM | POA: Diagnosis present

## 2014-05-24 DIAGNOSIS — K922 Gastrointestinal hemorrhage, unspecified: Secondary | ICD-10-CM | POA: Diagnosis not present

## 2014-05-24 DIAGNOSIS — K625 Hemorrhage of anus and rectum: Secondary | ICD-10-CM | POA: Diagnosis not present

## 2014-05-24 DIAGNOSIS — E119 Type 2 diabetes mellitus without complications: Secondary | ICD-10-CM | POA: Diagnosis present

## 2014-05-24 DIAGNOSIS — K5190 Ulcerative colitis, unspecified, without complications: Secondary | ICD-10-CM | POA: Diagnosis not present

## 2014-05-25 DIAGNOSIS — K5289 Other specified noninfective gastroenteritis and colitis: Secondary | ICD-10-CM | POA: Diagnosis not present

## 2014-05-25 DIAGNOSIS — K922 Gastrointestinal hemorrhage, unspecified: Secondary | ICD-10-CM | POA: Diagnosis not present

## 2014-05-25 DIAGNOSIS — D7289 Other specified disorders of white blood cells: Secondary | ICD-10-CM | POA: Diagnosis not present

## 2014-05-25 DIAGNOSIS — K625 Hemorrhage of anus and rectum: Secondary | ICD-10-CM | POA: Diagnosis not present

## 2014-05-26 DIAGNOSIS — K5289 Other specified noninfective gastroenteritis and colitis: Secondary | ICD-10-CM | POA: Diagnosis not present

## 2014-05-26 DIAGNOSIS — K922 Gastrointestinal hemorrhage, unspecified: Secondary | ICD-10-CM | POA: Diagnosis not present

## 2014-05-26 DIAGNOSIS — K625 Hemorrhage of anus and rectum: Secondary | ICD-10-CM | POA: Diagnosis not present

## 2014-05-26 DIAGNOSIS — D72829 Elevated white blood cell count, unspecified: Secondary | ICD-10-CM | POA: Diagnosis not present

## 2014-06-10 DIAGNOSIS — R109 Unspecified abdominal pain: Secondary | ICD-10-CM | POA: Diagnosis not present

## 2014-06-10 DIAGNOSIS — K625 Hemorrhage of anus and rectum: Secondary | ICD-10-CM | POA: Diagnosis not present

## 2014-06-11 DIAGNOSIS — M109 Gout, unspecified: Secondary | ICD-10-CM | POA: Diagnosis not present

## 2014-06-22 DIAGNOSIS — M109 Gout, unspecified: Secondary | ICD-10-CM | POA: Diagnosis not present

## 2014-06-22 DIAGNOSIS — K626 Ulcer of anus and rectum: Secondary | ICD-10-CM | POA: Diagnosis not present

## 2014-06-22 DIAGNOSIS — R238 Other skin changes: Secondary | ICD-10-CM | POA: Diagnosis not present

## 2014-06-22 DIAGNOSIS — K5190 Ulcerative colitis, unspecified, without complications: Secondary | ICD-10-CM | POA: Diagnosis not present

## 2014-06-22 DIAGNOSIS — K219 Gastro-esophageal reflux disease without esophagitis: Secondary | ICD-10-CM | POA: Diagnosis not present

## 2014-06-22 DIAGNOSIS — Z7982 Long term (current) use of aspirin: Secondary | ICD-10-CM | POA: Diagnosis not present

## 2014-06-22 DIAGNOSIS — K5730 Diverticulosis of large intestine without perforation or abscess without bleeding: Secondary | ICD-10-CM | POA: Diagnosis not present

## 2014-06-22 DIAGNOSIS — K5289 Other specified noninfective gastroenteritis and colitis: Secondary | ICD-10-CM | POA: Diagnosis not present

## 2014-06-22 DIAGNOSIS — K625 Hemorrhage of anus and rectum: Secondary | ICD-10-CM | POA: Diagnosis not present

## 2014-06-22 DIAGNOSIS — Z8489 Family history of other specified conditions: Secondary | ICD-10-CM | POA: Diagnosis not present

## 2014-06-22 DIAGNOSIS — R109 Unspecified abdominal pain: Secondary | ICD-10-CM | POA: Diagnosis not present

## 2014-06-22 DIAGNOSIS — E119 Type 2 diabetes mellitus without complications: Secondary | ICD-10-CM | POA: Diagnosis not present

## 2014-06-22 DIAGNOSIS — I10 Essential (primary) hypertension: Secondary | ICD-10-CM | POA: Diagnosis not present

## 2014-06-22 DIAGNOSIS — Z823 Family history of stroke: Secondary | ICD-10-CM | POA: Diagnosis not present

## 2014-06-22 DIAGNOSIS — M1990 Unspecified osteoarthritis, unspecified site: Secondary | ICD-10-CM | POA: Diagnosis not present

## 2014-06-22 DIAGNOSIS — M810 Age-related osteoporosis without current pathological fracture: Secondary | ICD-10-CM | POA: Diagnosis not present

## 2014-06-22 DIAGNOSIS — Z800 Family history of malignant neoplasm of digestive organs: Secondary | ICD-10-CM | POA: Diagnosis not present

## 2014-06-22 DIAGNOSIS — Z8673 Personal history of transient ischemic attack (TIA), and cerebral infarction without residual deficits: Secondary | ICD-10-CM | POA: Diagnosis not present

## 2014-06-22 DIAGNOSIS — K644 Residual hemorrhoidal skin tags: Secondary | ICD-10-CM | POA: Diagnosis not present

## 2014-06-22 DIAGNOSIS — Z79899 Other long term (current) drug therapy: Secondary | ICD-10-CM | POA: Diagnosis not present

## 2014-06-23 DIAGNOSIS — K559 Vascular disorder of intestine, unspecified: Secondary | ICD-10-CM | POA: Insufficient documentation

## 2014-06-30 DIAGNOSIS — IMO0002 Reserved for concepts with insufficient information to code with codable children: Secondary | ICD-10-CM | POA: Diagnosis not present

## 2014-06-30 DIAGNOSIS — Z96659 Presence of unspecified artificial knee joint: Secondary | ICD-10-CM | POA: Diagnosis not present

## 2014-06-30 DIAGNOSIS — M1710 Unilateral primary osteoarthritis, unspecified knee: Secondary | ICD-10-CM | POA: Diagnosis not present

## 2014-07-05 DIAGNOSIS — K626 Ulcer of anus and rectum: Secondary | ICD-10-CM | POA: Diagnosis not present

## 2014-07-28 DIAGNOSIS — E7800 Pure hypercholesterolemia, unspecified: Secondary | ICD-10-CM | POA: Insufficient documentation

## 2014-07-30 DIAGNOSIS — K2100 Gastro-esophageal reflux disease with esophagitis, without bleeding: Secondary | ICD-10-CM | POA: Diagnosis not present

## 2014-07-30 DIAGNOSIS — E7800 Pure hypercholesterolemia, unspecified: Secondary | ICD-10-CM | POA: Diagnosis not present

## 2014-07-30 DIAGNOSIS — I10 Essential (primary) hypertension: Secondary | ICD-10-CM | POA: Diagnosis not present

## 2014-07-30 DIAGNOSIS — IMO0001 Reserved for inherently not codable concepts without codable children: Secondary | ICD-10-CM | POA: Diagnosis not present

## 2014-08-06 DIAGNOSIS — E669 Obesity, unspecified: Secondary | ICD-10-CM | POA: Diagnosis not present

## 2014-08-06 DIAGNOSIS — M1990 Unspecified osteoarthritis, unspecified site: Secondary | ICD-10-CM | POA: Diagnosis not present

## 2014-08-06 DIAGNOSIS — N189 Chronic kidney disease, unspecified: Secondary | ICD-10-CM | POA: Diagnosis not present

## 2014-08-06 DIAGNOSIS — E1165 Type 2 diabetes mellitus with hyperglycemia: Secondary | ICD-10-CM | POA: Diagnosis not present

## 2014-08-06 DIAGNOSIS — E1129 Type 2 diabetes mellitus with other diabetic kidney complication: Secondary | ICD-10-CM | POA: Diagnosis not present

## 2014-08-06 DIAGNOSIS — Z23 Encounter for immunization: Secondary | ICD-10-CM | POA: Diagnosis not present

## 2014-08-06 DIAGNOSIS — E7800 Pure hypercholesterolemia, unspecified: Secondary | ICD-10-CM | POA: Diagnosis not present

## 2014-08-06 DIAGNOSIS — I10 Essential (primary) hypertension: Secondary | ICD-10-CM | POA: Diagnosis not present

## 2014-09-02 DIAGNOSIS — Z1231 Encounter for screening mammogram for malignant neoplasm of breast: Secondary | ICD-10-CM | POA: Diagnosis not present

## 2014-11-23 ENCOUNTER — Ambulatory Visit (INDEPENDENT_AMBULATORY_CARE_PROVIDER_SITE_OTHER): Payer: Medicare Other | Admitting: Cardiology

## 2014-11-23 ENCOUNTER — Encounter: Payer: Self-pay | Admitting: Cardiology

## 2014-11-23 VITALS — BP 145/74 | HR 60 | Ht 60.0 in | Wt 164.0 lb

## 2014-11-23 DIAGNOSIS — Z136 Encounter for screening for cardiovascular disorders: Secondary | ICD-10-CM

## 2014-11-23 DIAGNOSIS — I471 Supraventricular tachycardia: Secondary | ICD-10-CM

## 2014-11-23 DIAGNOSIS — I2510 Atherosclerotic heart disease of native coronary artery without angina pectoris: Secondary | ICD-10-CM

## 2014-11-23 DIAGNOSIS — I10 Essential (primary) hypertension: Secondary | ICD-10-CM | POA: Diagnosis not present

## 2014-11-23 NOTE — Patient Instructions (Signed)

## 2014-11-23 NOTE — Assessment & Plan Note (Signed)
No angina symptoms with history of nonobstructive disease. Continue observation.

## 2014-11-23 NOTE — Assessment & Plan Note (Signed)
Symptomatically stable on Tenormin. No changes made today. ECG reviewed. 6 month follow-up arranged.

## 2014-11-23 NOTE — Progress Notes (Signed)
   Reason for visit: PSVT, CAD  Clinical Summary Ms. Spada is an 81 y.o.female last seen in January 2015. She presents for a routine visit. States that she had some recent indigestion symptoms around the holidays, otherwise no substantial degree of palpitations or exertional chest pain. She continues on Tenormin. ECG today showed sinus bradycardia.  She continues to keep her house and remains functional in her ADLs.   Allergies  Allergen Reactions  . Toprol Xl [Metoprolol Succinate] Other (See Comments)    HAIR LOSS    Current Outpatient Prescriptions  Medication Sig Dispense Refill  . acetaminophen (TYLENOL) 500 MG tablet Take 1,000 mg by mouth 2 (two) times daily.    Marland Kitchen aspirin 81 MG tablet Take 162 mg by mouth at bedtime.    Marland Kitchen atenolol (TENORMIN) 50 MG tablet Take 1 tablet (50 mg total) by mouth daily. 90 tablet 3  . Calcium Carbonate-Vitamin D (CALTRATE 600+D) 600-400 MG-UNIT per tablet Take 1 tablet by mouth 2 (two) times daily.      . captopril (CAPOTEN) 25 MG tablet Take 25 mg by mouth 2 (two) times daily.      . metFORMIN (GLUCOPHAGE-XR) 500 MG 24 hr tablet Take 2 tablets by mouth daily.     . Multiple Vitamin (MULTIVITAMIN) tablet Take 1 tablet by mouth daily.      . nitroGLYCERIN (NITROSTAT) 0.4 MG SL tablet Place 0.4 mg under the tongue every 5 (five) minutes as needed.    Marland Kitchen omeprazole (PRILOSEC OTC) 20 MG tablet Take 20 mg by mouth daily.      . simvastatin (ZOCOR) 10 MG tablet Take 10 mg by mouth at bedtime.       No current facility-administered medications for this visit.    Past Medical History  Diagnosis Date  . Coronary atherosclerosis of native coronary artery     Nonobstructive  . Type 2 diabetes mellitus     Diet controlled  . Dyslipidemia   . TIA (transient ischemic attack)   . PSVT (paroxysmal supraventricular tachycardia)     Social History Ms. Quain reports that she has never smoked. She has never used smokeless tobacco. Ms. Asiedu reports that she does  not drink alcohol.  Review of Systems Complete review of systems negative except as otherwise outlined in the clinical summary and also the following. Stable appetite. No bleeding problems. No falls.  Physical Examination Filed Vitals:   11/23/14 1058  BP: 145/74  Pulse:    Filed Weights   11/23/14 1051  Weight: 164 lb (74.39 kg)    No acute distress.  HEENT: Conjunctiva and lids normal, oropharynx clear.  Neck: Supple, no elevated JVP or carotid bruits, no thyromegaly.  Lungs: Clear to auscultation, nonlabored breathing at rest.  Cardiac: Regular rate and rhythm, no S3 or significant systolic murmur.  Abdomen: Soft, nontender, bowel sounds present.  Extremities: No pitting edema, distal pulses 2+.    Problem List and Plan   PSVT (paroxysmal supraventricular tachycardia) Symptomatically stable on Tenormin. No changes made today. ECG reviewed. 6 month follow-up arranged.  Coronary atherosclerosis of native coronary artery No angina symptoms with history of nonobstructive disease. Continue observation.    Satira Sark, M.D., F.A.C.C.

## 2014-11-29 DIAGNOSIS — E780 Pure hypercholesterolemia: Secondary | ICD-10-CM | POA: Diagnosis not present

## 2014-11-29 DIAGNOSIS — E1165 Type 2 diabetes mellitus with hyperglycemia: Secondary | ICD-10-CM | POA: Diagnosis not present

## 2014-11-29 DIAGNOSIS — I10 Essential (primary) hypertension: Secondary | ICD-10-CM | POA: Diagnosis not present

## 2014-12-07 DIAGNOSIS — I10 Essential (primary) hypertension: Secondary | ICD-10-CM | POA: Diagnosis not present

## 2014-12-07 DIAGNOSIS — M1991 Primary osteoarthritis, unspecified site: Secondary | ICD-10-CM | POA: Diagnosis not present

## 2014-12-07 DIAGNOSIS — R809 Proteinuria, unspecified: Secondary | ICD-10-CM | POA: Diagnosis not present

## 2014-12-07 DIAGNOSIS — E1121 Type 2 diabetes mellitus with diabetic nephropathy: Secondary | ICD-10-CM | POA: Diagnosis not present

## 2014-12-07 DIAGNOSIS — E780 Pure hypercholesterolemia: Secondary | ICD-10-CM | POA: Diagnosis not present

## 2014-12-07 DIAGNOSIS — Z1389 Encounter for screening for other disorder: Secondary | ICD-10-CM | POA: Diagnosis not present

## 2015-03-14 DIAGNOSIS — H40053 Ocular hypertension, bilateral: Secondary | ICD-10-CM | POA: Diagnosis not present

## 2015-03-22 DIAGNOSIS — R809 Proteinuria, unspecified: Secondary | ICD-10-CM | POA: Diagnosis not present

## 2015-03-22 DIAGNOSIS — E1121 Type 2 diabetes mellitus with diabetic nephropathy: Secondary | ICD-10-CM | POA: Diagnosis not present

## 2015-03-22 DIAGNOSIS — I10 Essential (primary) hypertension: Secondary | ICD-10-CM | POA: Diagnosis not present

## 2015-03-22 DIAGNOSIS — M1991 Primary osteoarthritis, unspecified site: Secondary | ICD-10-CM | POA: Diagnosis not present

## 2015-03-22 DIAGNOSIS — E780 Pure hypercholesterolemia: Secondary | ICD-10-CM | POA: Diagnosis not present

## 2015-03-30 DIAGNOSIS — M1991 Primary osteoarthritis, unspecified site: Secondary | ICD-10-CM | POA: Diagnosis not present

## 2015-03-30 DIAGNOSIS — E1121 Type 2 diabetes mellitus with diabetic nephropathy: Secondary | ICD-10-CM | POA: Diagnosis not present

## 2015-03-30 DIAGNOSIS — I10 Essential (primary) hypertension: Secondary | ICD-10-CM | POA: Diagnosis not present

## 2015-03-30 DIAGNOSIS — E780 Pure hypercholesterolemia: Secondary | ICD-10-CM | POA: Diagnosis not present

## 2015-03-30 DIAGNOSIS — R809 Proteinuria, unspecified: Secondary | ICD-10-CM | POA: Diagnosis not present

## 2015-03-31 DIAGNOSIS — E1121 Type 2 diabetes mellitus with diabetic nephropathy: Secondary | ICD-10-CM | POA: Diagnosis not present

## 2015-05-01 DIAGNOSIS — E1122 Type 2 diabetes mellitus with diabetic chronic kidney disease: Secondary | ICD-10-CM | POA: Diagnosis not present

## 2015-05-24 ENCOUNTER — Ambulatory Visit (INDEPENDENT_AMBULATORY_CARE_PROVIDER_SITE_OTHER): Payer: Medicare Other | Admitting: Cardiology

## 2015-05-24 ENCOUNTER — Encounter: Payer: Self-pay | Admitting: Cardiology

## 2015-05-24 VITALS — BP 138/72 | HR 73 | Ht 60.0 in | Wt 168.0 lb

## 2015-05-24 DIAGNOSIS — I471 Supraventricular tachycardia: Secondary | ICD-10-CM | POA: Diagnosis not present

## 2015-05-24 DIAGNOSIS — I2510 Atherosclerotic heart disease of native coronary artery without angina pectoris: Secondary | ICD-10-CM

## 2015-05-24 MED ORDER — NITROGLYCERIN 0.4 MG SL SUBL
0.4000 mg | SUBLINGUAL_TABLET | SUBLINGUAL | Status: DC | PRN
Start: 2015-05-24 — End: 2019-01-19

## 2015-05-24 NOTE — Progress Notes (Signed)
Cardiology Office Note  Date: 05/24/2015   ID: [name], DOB 03-31-1927, MRN [id number]  PCP: [name] Hilding, MD  Primary Cardiologist: [name] Lesches, MD   Chief Complaint  Patient presents with  . PSVT  . Coronary Artery Disease    History of Present Illness: [name] is an 81 y.o. female last seen in January 2016. She presents for a routine follow-up visit. No significant palpitations reported on atenolol. She remains functional with her ADLs, has had no exertional chest pain. She does report intermittent reflux. Continues to follow with Dr. Quintin [name] every 4 months.  We reviewed her medications. She has an old bottle of nitroglycerin.   Past Medical History  Diagnosis Date  . Coronary atherosclerosis of native coronary artery     Nonobstructive  . Type 2 diabetes mellitus     Diet controlled  . Dyslipidemia   . TIA (transient ischemic attack)   . PSVT (paroxysmal supraventricular tachycardia)     Current Outpatient Prescriptions  Medication Sig Dispense Refill  . acetaminophen (TYLENOL) 500 MG tablet Take 1,000 mg by mouth 2 (two) times daily.    Marland Kitchen aspirin 81 MG tablet Take 162 mg by mouth at bedtime.    Marland Kitchen atenolol (TENORMIN) 50 MG tablet Take 1 tablet (50 mg total) by mouth daily. 90 tablet 3  . Calcium Carbonate-Vitamin D (CALTRATE 600+D) 600-400 MG-UNIT per tablet Take 1 tablet by mouth 2 (two) times daily.      . captopril (CAPOTEN) 25 MG tablet Take 25 mg by mouth 2 (two) times daily.      . metFORMIN (GLUCOPHAGE-XR) 500 MG 24 hr tablet Take 2 tablets by mouth daily.     . Multiple Vitamin (MULTIVITAMIN) tablet Take 1 tablet by mouth daily.      . nitroGLYCERIN (NITROSTAT) 0.4 MG SL tablet Place 1 tablet (0.4 mg total) under the tongue every 5 (five) minutes x 3 doses as needed. 25 tablet 3  . omeprazole (PRILOSEC OTC) 20 MG tablet Take 20 mg by mouth daily.      . simvastatin (ZOCOR) 10 MG tablet Take 10 mg by mouth at bedtime.       No current  facility-administered medications for this visit.    Allergies:  Toprol xl   Social History: The patient  reports that she has never smoked. She has never used smokeless tobacco. She reports that she does not drink alcohol or use illicit drugs.   ROS:  Please see the history of present illness. Otherwise, complete review of systems is positive for arthritic pains.  All other systems are reviewed and negative.   Physical Exam: VS:  BP 138/72 mmHg  Pulse 73  Ht 5' (1.524 m)  Wt 168 lb (76.204 kg)  BMI 32.81 kg/m2  SpO2 97%, BMI Body mass index is 32.81 kg/(m^2).  Wt Readings from Last 3 Encounters:  05/24/15 168 lb (76.204 kg)  11/23/14 164 lb (74.39 kg)  11/23/13 165 lb (74.844 kg)     No acute distress.  HEENT: Conjunctiva and lids normal, oropharynx clear.  Neck: Supple, no elevated JVP or carotid bruits, no thyromegaly.  Lungs: Clear to auscultation, nonlabored breathing at rest.  Cardiac: Regular rate and rhythm, no S3 or significant systolic murmur.  Abdomen: Soft, nontender, bowel sounds present.  Extremities: No pitting edema, distal pulses 2+.    ECG: Tracing from 11/23/2014 showed sinus bradycardia.   Assessment and Plan:  1. History of PSVT, symptomatically controlled on atenolol.  2.  History of nonobstructive CAD, no active angina symptoms. Continue aspirin and statin. Refill provided for fresh bottle of nitroglycerin.  Current medicines were reviewed with the patient today.  Disposition: FU with me in 6 months.   Signed, Satira Sark, MD, [facility] 05/24/2015 9:40 AM    [name], [place], [place] [address] Phone: [phone]; Fax: [phone]

## 2015-05-24 NOTE — Patient Instructions (Signed)
Your physician recommends that you continue on your current medications as directed. Please refer to the Current Medication list given to you today. Your physician recommends that you schedule a follow-up appointment in: 6 months. You will receive a reminder letter in the mail in about 4 months reminding you to call and schedule your appointment. If you don't receive this letter, please contact our office. 

## 2015-05-31 DIAGNOSIS — N189 Chronic kidney disease, unspecified: Secondary | ICD-10-CM | POA: Diagnosis not present

## 2015-07-01 DIAGNOSIS — N189 Chronic kidney disease, unspecified: Secondary | ICD-10-CM | POA: Diagnosis not present

## 2015-07-14 DIAGNOSIS — E780 Pure hypercholesterolemia: Secondary | ICD-10-CM | POA: Diagnosis not present

## 2015-07-14 DIAGNOSIS — I10 Essential (primary) hypertension: Secondary | ICD-10-CM | POA: Diagnosis not present

## 2015-07-14 DIAGNOSIS — E1165 Type 2 diabetes mellitus with hyperglycemia: Secondary | ICD-10-CM | POA: Diagnosis not present

## 2015-07-21 DIAGNOSIS — M1991 Primary osteoarthritis, unspecified site: Secondary | ICD-10-CM | POA: Diagnosis not present

## 2015-07-21 DIAGNOSIS — I10 Essential (primary) hypertension: Secondary | ICD-10-CM | POA: Diagnosis not present

## 2015-07-21 DIAGNOSIS — N183 Chronic kidney disease, stage 3 (moderate): Secondary | ICD-10-CM | POA: Diagnosis not present

## 2015-07-21 DIAGNOSIS — E1122 Type 2 diabetes mellitus with diabetic chronic kidney disease: Secondary | ICD-10-CM | POA: Diagnosis not present

## 2015-07-21 DIAGNOSIS — E1121 Type 2 diabetes mellitus with diabetic nephropathy: Secondary | ICD-10-CM | POA: Diagnosis not present

## 2015-07-21 DIAGNOSIS — Z23 Encounter for immunization: Secondary | ICD-10-CM | POA: Diagnosis not present

## 2015-07-21 DIAGNOSIS — R809 Proteinuria, unspecified: Secondary | ICD-10-CM | POA: Diagnosis not present

## 2015-07-21 DIAGNOSIS — E780 Pure hypercholesterolemia: Secondary | ICD-10-CM | POA: Diagnosis not present

## 2015-08-01 DIAGNOSIS — I10 Essential (primary) hypertension: Secondary | ICD-10-CM | POA: Diagnosis not present

## 2015-09-05 DIAGNOSIS — Z1231 Encounter for screening mammogram for malignant neoplasm of breast: Secondary | ICD-10-CM | POA: Diagnosis not present

## 2015-09-23 DIAGNOSIS — Z6831 Body mass index (BMI) 31.0-31.9, adult: Secondary | ICD-10-CM | POA: Diagnosis not present

## 2015-09-23 DIAGNOSIS — Z1272 Encounter for screening for malignant neoplasm of vagina: Secondary | ICD-10-CM | POA: Diagnosis not present

## 2015-09-23 DIAGNOSIS — Z01419 Encounter for gynecological examination (general) (routine) without abnormal findings: Secondary | ICD-10-CM | POA: Diagnosis not present

## 2015-11-16 DIAGNOSIS — N183 Chronic kidney disease, stage 3 (moderate): Secondary | ICD-10-CM | POA: Diagnosis not present

## 2015-11-16 DIAGNOSIS — I10 Essential (primary) hypertension: Secondary | ICD-10-CM | POA: Diagnosis not present

## 2015-11-16 DIAGNOSIS — E1165 Type 2 diabetes mellitus with hyperglycemia: Secondary | ICD-10-CM | POA: Diagnosis not present

## 2015-11-16 DIAGNOSIS — E7800 Pure hypercholesterolemia, unspecified: Secondary | ICD-10-CM | POA: Diagnosis not present

## 2015-11-23 DIAGNOSIS — R809 Proteinuria, unspecified: Secondary | ICD-10-CM | POA: Diagnosis not present

## 2015-11-23 DIAGNOSIS — N183 Chronic kidney disease, stage 3 (moderate): Secondary | ICD-10-CM | POA: Diagnosis not present

## 2015-11-23 DIAGNOSIS — E1121 Type 2 diabetes mellitus with diabetic nephropathy: Secondary | ICD-10-CM | POA: Diagnosis not present

## 2015-11-23 DIAGNOSIS — I10 Essential (primary) hypertension: Secondary | ICD-10-CM | POA: Diagnosis not present

## 2015-11-23 DIAGNOSIS — M1991 Primary osteoarthritis, unspecified site: Secondary | ICD-10-CM | POA: Diagnosis not present

## 2015-11-30 ENCOUNTER — Ambulatory Visit (INDEPENDENT_AMBULATORY_CARE_PROVIDER_SITE_OTHER): Payer: Medicare Other | Admitting: Cardiology

## 2015-11-30 ENCOUNTER — Encounter: Payer: Self-pay | Admitting: Cardiology

## 2015-11-30 VITALS — BP 152/74 | HR 63 | Ht 60.0 in | Wt 160.0 lb

## 2015-11-30 DIAGNOSIS — I471 Supraventricular tachycardia: Secondary | ICD-10-CM

## 2015-11-30 DIAGNOSIS — I2510 Atherosclerotic heart disease of native coronary artery without angina pectoris: Secondary | ICD-10-CM

## 2015-11-30 DIAGNOSIS — I10 Essential (primary) hypertension: Secondary | ICD-10-CM | POA: Diagnosis not present

## 2015-11-30 NOTE — Progress Notes (Signed)
Cardiology Office Note  Date: 11/30/2015   ID: [name], DOB 1927/07/06, MRN [id number]  PCP: [name] Hilding, MD  Primary Cardiologist: [name] Lesches, MD   Chief Complaint  Patient presents with  . PSVT  . Coronary Artery Disease    History of Present Illness: [name] is an 82 y.o. female last seen in July 2016. She is here today with her son for a follow-up visit. Since last evaluation, she does not report any progressive palpitations. She states that she has used nitroglycerin on 2 occasions for fairly brief episodes of chest discomfort, although is not entirely clear that nitroglycerin helped however. She has had no reproducible exertional chest discomfort or progressing shortness of breath.  We discussed her medications today which are outlined below and stable from a cardiac perspective. She is on aspirin, atenolol, captopril, Zocor, and has as needed nitroglycerin.  ECG today shows sinus rhythm with decreased R wave progression.  She reports having a recent visit with Dr. Quintin [name] and had lab work obtained.  Past Medical History  Diagnosis Date  . Coronary atherosclerosis of native coronary artery     Nonobstructive  . Type 2 diabetes mellitus (HCC)     Diet controlled  . Dyslipidemia   . TIA (transient ischemic attack)   . PSVT (paroxysmal supraventricular tachycardia) (HCC)     Current Outpatient Prescriptions  Medication Sig Dispense Refill  . acetaminophen (TYLENOL) 500 MG tablet Take 1,000 mg by mouth 2 (two) times daily.    Marland Kitchen aspirin 81 MG tablet Take 162 mg by mouth at bedtime.    Marland Kitchen atenolol (TENORMIN) 50 MG tablet Take 1 tablet (50 mg total) by mouth daily. 90 tablet 3  . Calcium Carbonate-Vitamin D (CALTRATE 600+D) 600-400 MG-UNIT per tablet Take 1 tablet by mouth daily.     . captopril (CAPOTEN) 25 MG tablet Take 25 mg by mouth 2 (two) times daily.      . metFORMIN (GLUCOPHAGE-XR) 500 MG 24 hr tablet Take 2 tablets by mouth daily.     . Multiple  Vitamin (MULTIVITAMIN) tablet Take 1 tablet by mouth daily.      . nitroGLYCERIN (NITROSTAT) 0.4 MG SL tablet Place 1 tablet (0.4 mg total) under the tongue every 5 (five) minutes x 3 doses as needed. 25 tablet 3  . omeprazole (PRILOSEC OTC) 20 MG tablet Take 20 mg by mouth daily.      . simvastatin (ZOCOR) 10 MG tablet Take 10 mg by mouth at bedtime.       No current facility-administered medications for this visit.   Allergies:  Toprol xl   Social History: The patient  reports that she has never smoked. She has never used smokeless tobacco. She reports that she does not drink alcohol or use illicit drugs.   ROS:  Please see the history of present illness. Otherwise, complete review of systems is positive for arthritic pains, uses a cane.  All other systems are reviewed and negative.   Physical Exam: VS:  BP 152/74 mmHg  Pulse 63  Ht 5' (1.524 m)  Wt 160 lb (72.576 kg)  BMI 31.25 kg/m2  SpO2 97%, BMI Body mass index is 31.25 kg/(m^2).  Wt Readings from Last 3 Encounters:  11/30/15 160 lb (72.576 kg)  05/24/15 168 lb (76.204 kg)  11/23/14 164 lb (74.39 kg)    Elderly woman, appears comfortable at rest. HEENT: Conjunctiva and lids normal, oropharynx clear.  Neck: Supple, no elevated JVP or carotid bruits, no  thyromegaly.  Lungs: Clear to auscultation, nonlabored breathing at rest.  Cardiac: Regular rate and rhythm, no S3 or significant systolic murmur.  Abdomen: Soft, nontender, bowel sounds present.  Extremities: No pitting edema, distal pulses 2+.   ECG: ECG is ordered today.  Other Studies Reviewed Today:  Cardiac catheterization 08/22/2006: Left main was normal.  LAD was a long vessel wrapping the apex. It gave off a small first diagonal and a large second diagonal. There was a 30% lesion in the proximal portion of the LAD.  Left circumflex was moderate size system. It gave off a large OM1 and a small posterolateral. There was a 20% stenosis in the mid  section.  Right coronary artery was a moderate sized dominant vessel. It gave off an RV branch, a small PDA and a small posterolateral. There was no angiographic CAD.  Left ventriculogram done in the RAO position showed an EF of 65% with no regional wall motion abnormalities. There was no mitral regurgitation. There was significant mitral annular calcification noted.  ASSESSMENT: 1. Minimal nonobstructive coronary artery disease as described above. 2. Normal left ventricular function. 3. Significant mitral annular calcification without any significant mitral regurgitation.  Assessment and Plan:  1. History of PSVT, symptomatically well controlled on atenolol. ECG reviewed. We will continue observation.  2. Previously documented minor coronary atherosclerosis by cardiac catheterization in 2007. She is not reporting any progressive angina symptoms. Continue medical therapy and observation.  Current medicines were reviewed with the patient today.   Orders Placed This Encounter  Procedures  . EKG 12-Lead    Disposition: FU with me in 6 months.   Signed, Satira Sark, MD, [facility] 11/30/2015 10:06 AM    [name] at [place], [place], [place] [address] Phone: [phone]; Fax: [phone]

## 2015-11-30 NOTE — Patient Instructions (Signed)
Your physician recommends that you continue on your current medications as directed. Please refer to the Current Medication list given to you today. Your physician recommends that you schedule a follow-up appointment in: 6 months. You will receive a reminder letter in the mail in about 4 months reminding you to call and schedule your appointment. If you don't receive this letter, please contact our office. 

## 2016-03-30 DIAGNOSIS — E1122 Type 2 diabetes mellitus with diabetic chronic kidney disease: Secondary | ICD-10-CM | POA: Diagnosis not present

## 2016-03-30 DIAGNOSIS — E7800 Pure hypercholesterolemia, unspecified: Secondary | ICD-10-CM | POA: Diagnosis not present

## 2016-03-30 DIAGNOSIS — N183 Chronic kidney disease, stage 3 (moderate): Secondary | ICD-10-CM | POA: Diagnosis not present

## 2016-03-30 DIAGNOSIS — M1991 Primary osteoarthritis, unspecified site: Secondary | ICD-10-CM | POA: Diagnosis not present

## 2016-03-30 DIAGNOSIS — I10 Essential (primary) hypertension: Secondary | ICD-10-CM | POA: Diagnosis not present

## 2016-04-06 DIAGNOSIS — Z1389 Encounter for screening for other disorder: Secondary | ICD-10-CM | POA: Diagnosis not present

## 2016-04-06 DIAGNOSIS — E1122 Type 2 diabetes mellitus with diabetic chronic kidney disease: Secondary | ICD-10-CM | POA: Diagnosis not present

## 2016-04-06 DIAGNOSIS — E1121 Type 2 diabetes mellitus with diabetic nephropathy: Secondary | ICD-10-CM | POA: Diagnosis not present

## 2016-04-06 DIAGNOSIS — M1991 Primary osteoarthritis, unspecified site: Secondary | ICD-10-CM | POA: Diagnosis not present

## 2016-04-06 DIAGNOSIS — N183 Chronic kidney disease, stage 3 (moderate): Secondary | ICD-10-CM | POA: Diagnosis not present

## 2016-04-06 DIAGNOSIS — I10 Essential (primary) hypertension: Secondary | ICD-10-CM | POA: Diagnosis not present

## 2016-04-06 DIAGNOSIS — R809 Proteinuria, unspecified: Secondary | ICD-10-CM | POA: Diagnosis not present

## 2016-04-11 DIAGNOSIS — E11319 Type 2 diabetes mellitus with unspecified diabetic retinopathy without macular edema: Secondary | ICD-10-CM | POA: Diagnosis not present

## 2016-06-01 ENCOUNTER — Ambulatory Visit (INDEPENDENT_AMBULATORY_CARE_PROVIDER_SITE_OTHER): Payer: Medicare Other | Admitting: Cardiology

## 2016-06-01 ENCOUNTER — Encounter: Payer: Self-pay | Admitting: Cardiology

## 2016-06-01 VITALS — BP 120/81 | HR 63 | Ht 60.0 in | Wt 161.4 lb

## 2016-06-01 DIAGNOSIS — I2510 Atherosclerotic heart disease of native coronary artery without angina pectoris: Secondary | ICD-10-CM | POA: Diagnosis not present

## 2016-06-01 DIAGNOSIS — I471 Supraventricular tachycardia: Secondary | ICD-10-CM

## 2016-06-01 NOTE — Patient Instructions (Signed)

## 2016-06-01 NOTE — Progress Notes (Signed)
Cardiology Office Note  Date: 06/01/2016   ID: [name], DOB [birth year], MRN [id number]  PCP: Manon Hilding, MD  Primary Cardiologist: Rozann Lesches, MD   Chief Complaint  Patient presents with  . PSVT    History of Present Illness: [name] is an 82 y.o. female last seen in January. She presents for a routine follow-up visit. Reports only occasional feeling of palpitations, mainly at nighttime when she is still. Otherwise no significant change in stamina, no exertional chest pain or syncope. She is still functional in her ADLs and is driving.  I reviewed her medications which are outlined below. She remains on atenolol which has provided good control of PSVT over time.  Past Medical History  Diagnosis Date  . Coronary atherosclerosis of native coronary artery     Nonobstructive  . Type 2 diabetes mellitus (HCC)     Diet controlled  . Dyslipidemia   . TIA (transient ischemic attack)   . PSVT (paroxysmal supraventricular tachycardia) (HCC)     Current Outpatient Prescriptions  Medication Sig Dispense Refill  . acetaminophen (TYLENOL) 500 MG tablet Take 1,000 mg by mouth 2 (two) times daily.    Marland Kitchen aspirin 81 MG tablet Take 162 mg by mouth at bedtime.    Marland Kitchen atenolol (TENORMIN) 50 MG tablet Take 1 tablet (50 mg total) by mouth daily. 90 tablet 3  . Calcium Carbonate-Vitamin D (CALTRATE 600+D) 600-400 MG-UNIT per tablet Take 1 tablet by mouth daily.     . captopril (CAPOTEN) 25 MG tablet Take 25 mg by mouth 2 (two) times daily.      . metFORMIN (GLUCOPHAGE-XR) 500 MG 24 hr tablet Take 2 tabs (1,000mg ) by mouth every morning & 1 tab (500mg ) every evening    . Multiple Vitamin (MULTIVITAMIN) tablet Take 1 tablet by mouth daily.      . nitroGLYCERIN (NITROSTAT) 0.4 MG SL tablet Place 1 tablet (0.4 mg total) under the tongue every 5 (five) minutes x 3 doses as needed. 25 tablet 3  . omeprazole (PRILOSEC OTC) 20 MG tablet Take 20 mg by mouth daily.      . simvastatin (ZOCOR)  10 MG tablet Take 10 mg by mouth at bedtime.       No current facility-administered medications for this visit.   Allergies:  Toprol xl   Social History: The patient  reports that she has never smoked. She has never used smokeless tobacco. She reports that she does not drink alcohol or use illicit drugs.   ROS:  Please see the history of present illness. Otherwise, complete review of systems is positive for reflux.  All other systems are reviewed and negative.   Physical Exam: VS:  BP 120/81 mmHg  Pulse 63  Ht 5' (1.524 m)  Wt 161 lb 6.4 oz (73.211 kg)  BMI 31.52 kg/m2  SpO2 97%, BMI Body mass index is 31.52 kg/(m^2).  Wt Readings from Last 3 Encounters:  06/01/16 161 lb 6.4 oz (73.211 kg)  11/30/15 160 lb (72.576 kg)  05/24/15 168 lb (76.204 kg)    Elderly woman, appears comfortable at rest. HEENT: Conjunctiva and lids normal, oropharynx clear.  Neck: Supple, no elevated JVP or carotid bruits, no thyromegaly.  Lungs: Clear to auscultation, nonlabored breathing at rest.  Cardiac: Regular rate and rhythm, no S3 or significant systolic murmur.  Abdomen: Soft, nontender, bowel sounds present.  Extremities: No pitting edema, distal pulses 2+.   ECG: I personally reviewed the tracing from 11/30/2015 which showed  normal sinus rhythm.  Other Studies Reviewed Today:  Cardiac catheterization 08/22/2006: Left main was normal.  LAD was a long vessel wrapping the apex. It gave off a small first diagonal and a large second diagonal. There was a 30% lesion in the proximal portion of the LAD.  Left circumflex was moderate size system. It gave off a large OM1 and a small posterolateral. There was a 20% stenosis in the mid section.  Right coronary artery was a moderate sized dominant vessel. It gave off an RV branch, a small PDA and a small posterolateral. There was no angiographic CAD.  Left ventriculogram done in the RAO position showed an EF of 65% with no regional  wall motion abnormalities. There was no mitral regurgitation. There was significant mitral annular calcification noted.  ASSESSMENT: 1. Minimal nonobstructive coronary artery disease as described above. 2. Normal left ventricular function. 3. Significant mitral annular calcification without any significant mitral regurgitation.  Assessment and Plan:  1. PSVT, well-controlled on current regimen including atenolol. Continue observation.  2. Nonobstructive CAD based on previous workup. She has not had recent ischemic testing, but no angina is reported and we will continue with observation. She is on aspirin and statin therapy.  Current medicines were reviewed with the patient today.  Disposition: Follow-up with me in 6 months.  Signed, Satira Sark, MD, [facility] 06/01/2016 12:02 PM    [facility] at [place], [place], [place] [address] Phone: [phone]; Fax: [phone]

## 2016-07-13 ENCOUNTER — Other Ambulatory Visit: Payer: Self-pay

## 2016-07-19 DIAGNOSIS — E1165 Type 2 diabetes mellitus with hyperglycemia: Secondary | ICD-10-CM | POA: Diagnosis not present

## 2016-07-19 DIAGNOSIS — E7800 Pure hypercholesterolemia, unspecified: Secondary | ICD-10-CM | POA: Diagnosis not present

## 2016-07-19 DIAGNOSIS — N183 Chronic kidney disease, stage 3 (moderate): Secondary | ICD-10-CM | POA: Diagnosis not present

## 2016-07-19 DIAGNOSIS — I10 Essential (primary) hypertension: Secondary | ICD-10-CM | POA: Diagnosis not present

## 2016-07-19 DIAGNOSIS — Z13220 Encounter for screening for lipoid disorders: Secondary | ICD-10-CM | POA: Diagnosis not present

## 2016-07-24 DIAGNOSIS — I10 Essential (primary) hypertension: Secondary | ICD-10-CM | POA: Diagnosis not present

## 2016-07-24 DIAGNOSIS — E1121 Type 2 diabetes mellitus with diabetic nephropathy: Secondary | ICD-10-CM | POA: Diagnosis not present

## 2016-07-24 DIAGNOSIS — Z1389 Encounter for screening for other disorder: Secondary | ICD-10-CM | POA: Diagnosis not present

## 2016-07-24 DIAGNOSIS — N183 Chronic kidney disease, stage 3 (moderate): Secondary | ICD-10-CM | POA: Diagnosis not present

## 2016-07-24 DIAGNOSIS — M1991 Primary osteoarthritis, unspecified site: Secondary | ICD-10-CM | POA: Diagnosis not present

## 2016-07-24 DIAGNOSIS — R809 Proteinuria, unspecified: Secondary | ICD-10-CM | POA: Diagnosis not present

## 2016-08-27 DIAGNOSIS — H8110 Benign paroxysmal vertigo, unspecified ear: Secondary | ICD-10-CM | POA: Insufficient documentation

## 2016-08-28 DIAGNOSIS — H8112 Benign paroxysmal vertigo, left ear: Secondary | ICD-10-CM | POA: Diagnosis not present

## 2016-08-28 DIAGNOSIS — Z6831 Body mass index (BMI) 31.0-31.9, adult: Secondary | ICD-10-CM | POA: Diagnosis not present

## 2016-09-06 DIAGNOSIS — Z1231 Encounter for screening mammogram for malignant neoplasm of breast: Secondary | ICD-10-CM | POA: Diagnosis not present

## 2016-11-19 DIAGNOSIS — N183 Chronic kidney disease, stage 3 unspecified: Secondary | ICD-10-CM | POA: Insufficient documentation

## 2016-11-20 DIAGNOSIS — E1165 Type 2 diabetes mellitus with hyperglycemia: Secondary | ICD-10-CM | POA: Diagnosis not present

## 2016-11-20 DIAGNOSIS — N183 Chronic kidney disease, stage 3 (moderate): Secondary | ICD-10-CM | POA: Diagnosis not present

## 2016-11-20 DIAGNOSIS — I10 Essential (primary) hypertension: Secondary | ICD-10-CM | POA: Diagnosis not present

## 2016-11-20 DIAGNOSIS — E7800 Pure hypercholesterolemia, unspecified: Secondary | ICD-10-CM | POA: Diagnosis not present

## 2016-11-21 DIAGNOSIS — R197 Diarrhea, unspecified: Secondary | ICD-10-CM | POA: Insufficient documentation

## 2016-11-21 DIAGNOSIS — R809 Proteinuria, unspecified: Secondary | ICD-10-CM | POA: Insufficient documentation

## 2016-11-22 DIAGNOSIS — E1165 Type 2 diabetes mellitus with hyperglycemia: Secondary | ICD-10-CM | POA: Diagnosis not present

## 2016-11-22 DIAGNOSIS — E7800 Pure hypercholesterolemia, unspecified: Secondary | ICD-10-CM | POA: Diagnosis not present

## 2016-11-22 DIAGNOSIS — R197 Diarrhea, unspecified: Secondary | ICD-10-CM | POA: Diagnosis not present

## 2016-11-22 DIAGNOSIS — Z6831 Body mass index (BMI) 31.0-31.9, adult: Secondary | ICD-10-CM | POA: Diagnosis not present

## 2016-11-22 DIAGNOSIS — I10 Essential (primary) hypertension: Secondary | ICD-10-CM | POA: Diagnosis not present

## 2016-11-22 DIAGNOSIS — R809 Proteinuria, unspecified: Secondary | ICD-10-CM | POA: Diagnosis not present

## 2016-11-22 DIAGNOSIS — E1121 Type 2 diabetes mellitus with diabetic nephropathy: Secondary | ICD-10-CM | POA: Diagnosis not present

## 2016-11-28 NOTE — Progress Notes (Signed)
Cardiology Office Note  Date: 11/29/2016   ID: [name], DOB 04/27/1927, MRN [id number]  PCP: Manon Hilding, MD  Primary Cardiologist: Rozann Lesches, MD   Chief Complaint  Patient presents with  . PSVT    History of Present Illness: [name] is an 83 y.o. female last seen in July 2017. She presents for a routine follow-up visit. Describes an occasional sense of brief palpitation in her chest but nothing prolonged. She has had no exertional chest pain.  I reviewed her ECG today which shows probable sinus rhythm with lead artifact. She continues on atenolol which has provided good control of her PSVT.  Past Medical History:  Diagnosis Date  . Coronary atherosclerosis of native coronary artery    Nonobstructive  . Dyslipidemia   . PSVT (paroxysmal supraventricular tachycardia) ([place])   . TIA (transient ischemic attack)   . Type 2 diabetes mellitus ([place])    Diet controlled    Current Outpatient Prescriptions  Medication Sig Dispense Refill  . acetaminophen (TYLENOL) 500 MG tablet Take 1,000 mg by mouth 2 (two) times daily.    Marland Kitchen aspirin 81 MG tablet Take 162 mg by mouth at bedtime.    Marland Kitchen atenolol (TENORMIN) 50 MG tablet Take 1 tablet (50 mg total) by mouth daily. 90 tablet 3  . Calcium Carbonate-Vitamin D (CALTRATE 600+D) 600-400 MG-UNIT per tablet Take 1 tablet by mouth daily.     . captopril (CAPOTEN) 25 MG tablet Take 25 mg by mouth 2 (two) times daily.      . metFORMIN (GLUCOPHAGE-XR) 500 MG 24 hr tablet Take 2 tabs (1,000mg ) by mouth every morning & 1 tab (500mg ) every evening    . Multiple Vitamin (MULTIVITAMIN) tablet Take 1 tablet by mouth daily.      . nitroGLYCERIN (NITROSTAT) 0.4 MG SL tablet Place 1 tablet (0.4 mg total) under the tongue every 5 (five) minutes x 3 doses as needed. 25 tablet 3  . omeprazole (PRILOSEC OTC) 20 MG tablet Take 20 mg by mouth daily.      . simvastatin (ZOCOR) 10 MG tablet Take 10 mg by mouth at bedtime.       No current  facility-administered medications for this visit.    Allergies:  Toprol xl [metoprolol succinate]   Social History: The patient  reports that she has never smoked. She has never used smokeless tobacco. She reports that she does not drink alcohol or use drugs.   ROS:  Please see the history of present illness. Otherwise, complete review of systems is positive for chronic trouble with vertigo, uses a cane to steady herself.  All other systems are reviewed and negative.   Physical Exam: VS:  BP (!) 142/78   Pulse 70   Ht 5' (1.524 m)   Wt 168 lb (76.2 kg)   SpO2 97%   BMI 32.81 kg/m , BMI Body mass index is 32.81 kg/m.  Wt Readings from Last 3 Encounters:  11/29/16 168 lb (76.2 kg)  06/01/16 161 lb 6.4 oz (73.2 kg)  11/30/15 160 lb (72.6 kg)    Elderly woman, appears comfortable at rest. HEENT: Conjunctiva and lids normal, oropharynx clear.  Neck: Supple, no elevated JVP or carotid bruits, no thyromegaly.  Lungs: Clear to auscultation, nonlabored breathing at rest.  Cardiac: Regular rate and rhythm, no S3 or significant systolic murmur.  Abdomen: Soft, nontender, bowel sounds present.  Extremities: No pitting edema, distal pulses 2+.   ECG: I personally reviewed the tracing from  11/30/2015 which showed normal sinus rhythm.  Assessment and Plan:  1. Symptomatically stable PSVT, continues on atenolol. No changes were made today. ECG reviewed.  2. Prior history of nonobstructive CAD, no active angina symptoms. She continues on aspirin and Zocor. Continue observation.  Current medicines were reviewed with the patient today.   Orders Placed This Encounter  Procedures  . EKG 12-Lead    Disposition: Follow-up in 6 months.  Signed, Satira Sark, MD, [facility] 11/29/2016 2:09 PM    [place] at [place], [place], [place] [address] Phone: [phone]; Fax: [phone]

## 2016-11-29 ENCOUNTER — Ambulatory Visit (INDEPENDENT_AMBULATORY_CARE_PROVIDER_SITE_OTHER): Payer: Medicare Other | Admitting: Cardiology

## 2016-11-29 ENCOUNTER — Encounter: Payer: Self-pay | Admitting: Cardiology

## 2016-11-29 VITALS — BP 142/78 | HR 70 | Ht 60.0 in | Wt 168.0 lb

## 2016-11-29 DIAGNOSIS — I2510 Atherosclerotic heart disease of native coronary artery without angina pectoris: Secondary | ICD-10-CM | POA: Diagnosis not present

## 2016-11-29 DIAGNOSIS — I471 Supraventricular tachycardia: Secondary | ICD-10-CM | POA: Diagnosis not present

## 2016-11-29 NOTE — Patient Instructions (Signed)

## 2017-01-03 DIAGNOSIS — Z961 Presence of intraocular lens: Secondary | ICD-10-CM | POA: Diagnosis not present

## 2017-02-07 DIAGNOSIS — M542 Cervicalgia: Secondary | ICD-10-CM | POA: Diagnosis not present

## 2017-02-07 DIAGNOSIS — M1990 Unspecified osteoarthritis, unspecified site: Secondary | ICD-10-CM | POA: Diagnosis not present

## 2017-02-07 DIAGNOSIS — I10 Essential (primary) hypertension: Secondary | ICD-10-CM | POA: Diagnosis not present

## 2017-02-07 DIAGNOSIS — Z7984 Long term (current) use of oral hypoglycemic drugs: Secondary | ICD-10-CM | POA: Diagnosis not present

## 2017-02-07 DIAGNOSIS — Z7982 Long term (current) use of aspirin: Secondary | ICD-10-CM | POA: Diagnosis not present

## 2017-02-07 DIAGNOSIS — E119 Type 2 diabetes mellitus without complications: Secondary | ICD-10-CM | POA: Diagnosis not present

## 2017-02-07 DIAGNOSIS — Z8673 Personal history of transient ischemic attack (TIA), and cerebral infarction without residual deficits: Secondary | ICD-10-CM | POA: Diagnosis not present

## 2017-02-07 DIAGNOSIS — S62647A Nondisplaced fracture of proximal phalanx of left little finger, initial encounter for closed fracture: Secondary | ICD-10-CM | POA: Diagnosis not present

## 2017-02-07 DIAGNOSIS — S0093XA Contusion of unspecified part of head, initial encounter: Secondary | ICD-10-CM | POA: Diagnosis not present

## 2017-02-07 DIAGNOSIS — R51 Headache: Secondary | ICD-10-CM | POA: Diagnosis not present

## 2017-02-07 DIAGNOSIS — S0081XA Abrasion of other part of head, initial encounter: Secondary | ICD-10-CM | POA: Diagnosis not present

## 2017-02-07 DIAGNOSIS — S0083XA Contusion of other part of head, initial encounter: Secondary | ICD-10-CM | POA: Diagnosis not present

## 2017-02-07 DIAGNOSIS — M810 Age-related osteoporosis without current pathological fracture: Secondary | ICD-10-CM | POA: Diagnosis not present

## 2017-02-07 DIAGNOSIS — S0993XA Unspecified injury of face, initial encounter: Secondary | ICD-10-CM | POA: Diagnosis not present

## 2017-02-07 DIAGNOSIS — S0091XA Abrasion of unspecified part of head, initial encounter: Secondary | ICD-10-CM | POA: Diagnosis not present

## 2017-02-07 DIAGNOSIS — K219 Gastro-esophageal reflux disease without esophagitis: Secondary | ICD-10-CM | POA: Diagnosis not present

## 2017-02-07 DIAGNOSIS — S0003XA Contusion of scalp, initial encounter: Secondary | ICD-10-CM | POA: Diagnosis not present

## 2017-02-07 DIAGNOSIS — Z79899 Other long term (current) drug therapy: Secondary | ICD-10-CM | POA: Diagnosis not present

## 2017-02-07 DIAGNOSIS — S199XXA Unspecified injury of neck, initial encounter: Secondary | ICD-10-CM | POA: Diagnosis not present

## 2017-02-07 DIAGNOSIS — S62617A Displaced fracture of proximal phalanx of left little finger, initial encounter for closed fracture: Secondary | ICD-10-CM | POA: Diagnosis not present

## 2017-02-07 DIAGNOSIS — S0990XA Unspecified injury of head, initial encounter: Secondary | ICD-10-CM | POA: Diagnosis not present

## 2017-02-11 DIAGNOSIS — M79645 Pain in left finger(s): Secondary | ICD-10-CM | POA: Diagnosis not present

## 2017-02-11 DIAGNOSIS — S62619A Displaced fracture of proximal phalanx of unspecified finger, initial encounter for closed fracture: Secondary | ICD-10-CM | POA: Diagnosis not present

## 2017-02-19 DIAGNOSIS — H40053 Ocular hypertension, bilateral: Secondary | ICD-10-CM | POA: Diagnosis not present

## 2017-03-21 DIAGNOSIS — R6889 Other general symptoms and signs: Secondary | ICD-10-CM | POA: Diagnosis not present

## 2017-03-21 DIAGNOSIS — E1121 Type 2 diabetes mellitus with diabetic nephropathy: Secondary | ICD-10-CM | POA: Diagnosis not present

## 2017-03-21 DIAGNOSIS — Z13220 Encounter for screening for lipoid disorders: Secondary | ICD-10-CM | POA: Diagnosis not present

## 2017-03-21 DIAGNOSIS — E1165 Type 2 diabetes mellitus with hyperglycemia: Secondary | ICD-10-CM | POA: Diagnosis not present

## 2017-03-21 DIAGNOSIS — E7800 Pure hypercholesterolemia, unspecified: Secondary | ICD-10-CM | POA: Diagnosis not present

## 2017-03-21 DIAGNOSIS — I10 Essential (primary) hypertension: Secondary | ICD-10-CM | POA: Diagnosis not present

## 2017-03-28 DIAGNOSIS — R197 Diarrhea, unspecified: Secondary | ICD-10-CM | POA: Diagnosis not present

## 2017-03-28 DIAGNOSIS — R809 Proteinuria, unspecified: Secondary | ICD-10-CM | POA: Diagnosis not present

## 2017-03-28 DIAGNOSIS — E1165 Type 2 diabetes mellitus with hyperglycemia: Secondary | ICD-10-CM | POA: Diagnosis not present

## 2017-03-28 DIAGNOSIS — R059 Cough, unspecified: Secondary | ICD-10-CM | POA: Insufficient documentation

## 2017-03-28 DIAGNOSIS — I10 Essential (primary) hypertension: Secondary | ICD-10-CM | POA: Diagnosis not present

## 2017-03-28 DIAGNOSIS — E1121 Type 2 diabetes mellitus with diabetic nephropathy: Secondary | ICD-10-CM | POA: Diagnosis not present

## 2017-03-28 DIAGNOSIS — R05 Cough: Secondary | ICD-10-CM | POA: Diagnosis not present

## 2017-03-28 DIAGNOSIS — E7800 Pure hypercholesterolemia, unspecified: Secondary | ICD-10-CM | POA: Diagnosis not present

## 2017-05-30 ENCOUNTER — Encounter: Payer: Self-pay | Admitting: *Deleted

## 2017-05-30 NOTE — Progress Notes (Signed)
Cardiology Office Note  Date: 05/31/2017   ID: [name], DOB Apr 07, 1927, MRN [id number]  PCP: Manon Hilding, MD  Primary Cardiologist: Rozann Lesches, MD   Chief Complaint  Patient presents with  . PSVT    History of Present Illness: [name] is an 83 y.o. female last seen in January. She presents for a routine follow-up visit. Reports only occasional palpitations that are brief, tends to notice some more at nighttime when she is still. She has had no prolonged events, chest pain, or syncope.  ECG from January as outlined below. We discussed her medications. She reports compliance. She remains on atenolol at 50 mg daily.  She continues to follow with Dr. Quintin Alto on a regular basis.  Past Medical History:  Diagnosis Date  . Coronary atherosclerosis of native coronary artery    Nonobstructive  . Dyslipidemia   . PSVT (paroxysmal supraventricular tachycardia) ([place])   . TIA (transient ischemic attack)   . Type 2 diabetes mellitus ([place])    Diet controlled    No past surgical history on file.  Current Outpatient Prescriptions  Medication Sig Dispense Refill  . acetaminophen (TYLENOL) 500 MG tablet Take 1,000 mg by mouth 2 (two) times daily.    Marland Kitchen aspirin 81 MG tablet Take 162 mg by mouth at bedtime.    Marland Kitchen atenolol (TENORMIN) 50 MG tablet Take 1 tablet (50 mg total) by mouth daily. 90 tablet 3  . Calcium Carbonate-Vitamin D (CALTRATE 600+D) 600-400 MG-UNIT per tablet Take 1 tablet by mouth daily.     Marland Kitchen lisinopril (PRINIVIL,ZESTRIL) 20 MG tablet Take 20 mg by mouth daily.  3  . meclizine (ANTIVERT) 25 MG tablet Take 1 tablet by mouth 3 (three) times daily as needed.    . metFORMIN (GLUCOPHAGE-XR) 500 MG 24 hr tablet Take 2 tabs (1,000mg ) by mouth every morning & 1 tab (500mg ) every evening    . Multiple Vitamin (MULTIVITAMIN) tablet Take 1 tablet by mouth daily.      . nitroGLYCERIN (NITROSTAT) 0.4 MG SL tablet Place 1 tablet (0.4 mg total) under the tongue every 5  (five) minutes x 3 doses as needed. 25 tablet 3  . omeprazole (PRILOSEC OTC) 20 MG tablet Take 20 mg by mouth daily.      . simvastatin (ZOCOR) 10 MG tablet Take 10 mg by mouth at bedtime.       No current facility-administered medications for this visit.    Allergies:  Toprol xl [metoprolol succinate]   Social History: The patient  reports that she has never smoked. She has never used smokeless tobacco. She reports that she does not drink alcohol or use drugs.   ROS:  Please see the history of present illness. Otherwise, complete review of systems is positive for reported fall without syncope back in March, tripped over a curb.  All other systems are reviewed and negative.   Physical Exam: VS:  BP (!) 145/74   Pulse 64   Ht 5' (1.524 m)   Wt 156 lb 9.6 oz (71 kg)   BMI 30.58 kg/m , BMI Body mass index is 30.58 kg/m.  Wt Readings from Last 3 Encounters:  05/31/17 156 lb 9.6 oz (71 kg)  11/29/16 168 lb (76.2 kg)  06/01/16 161 lb 6.4 oz (73.2 kg)    Elderly woman, appears comfortable at rest. Using a cane. HEENT: Conjunctiva and lids normal, oropharynx clear.  Neck: Supple, no elevated JVP or carotid bruits, no thyromegaly.  Lungs: Clear  to auscultation, nonlabored breathing at rest.  Cardiac: Regular rate and rhythm, no S3 or significant systolic murmur.  Abdomen: Soft, nontender, bowel sounds present.  Extremities: No pitting edema, distal pulses 2+.   ECG: I personally reviewed the tracing from 11/29/2016 which showed probable sinus rhythm with lead motion artifact and low voltage.  Assessment and Plan:  1. PSVT, well-controlled on current dose of atenolol. Continue plan for observation.  2. History of nonobstructive CAD, no chest pain symptoms reported. She remains on aspirin and statin therapy.  Current medicines were reviewed with the patient today.  Disposition: Follow-up in 6 months.  Signed, Satira Sark, MD, [facility] 05/31/2017 9:38 AM    [place] at [place], [place], [place] [address] Phone: [phone]; Fax: [phone]

## 2017-05-31 ENCOUNTER — Ambulatory Visit (INDEPENDENT_AMBULATORY_CARE_PROVIDER_SITE_OTHER): Payer: Medicare Other | Admitting: Cardiology

## 2017-05-31 ENCOUNTER — Encounter: Payer: Self-pay | Admitting: Cardiology

## 2017-05-31 VITALS — BP 145/74 | HR 64 | Ht 60.0 in | Wt 156.6 lb

## 2017-05-31 DIAGNOSIS — I471 Supraventricular tachycardia: Secondary | ICD-10-CM | POA: Diagnosis not present

## 2017-05-31 DIAGNOSIS — I2510 Atherosclerotic heart disease of native coronary artery without angina pectoris: Secondary | ICD-10-CM | POA: Diagnosis not present

## 2017-05-31 NOTE — Patient Instructions (Signed)

## 2017-07-12 DIAGNOSIS — Z6829 Body mass index (BMI) 29.0-29.9, adult: Secondary | ICD-10-CM | POA: Insufficient documentation

## 2017-07-12 DIAGNOSIS — M545 Low back pain, unspecified: Secondary | ICD-10-CM | POA: Insufficient documentation

## 2017-07-23 DIAGNOSIS — M1991 Primary osteoarthritis, unspecified site: Secondary | ICD-10-CM | POA: Diagnosis not present

## 2017-07-23 DIAGNOSIS — I10 Essential (primary) hypertension: Secondary | ICD-10-CM | POA: Diagnosis not present

## 2017-07-23 DIAGNOSIS — N183 Chronic kidney disease, stage 3 (moderate): Secondary | ICD-10-CM | POA: Diagnosis not present

## 2017-07-23 DIAGNOSIS — E1122 Type 2 diabetes mellitus with diabetic chronic kidney disease: Secondary | ICD-10-CM | POA: Diagnosis not present

## 2017-07-23 DIAGNOSIS — E1165 Type 2 diabetes mellitus with hyperglycemia: Secondary | ICD-10-CM | POA: Diagnosis not present

## 2017-07-23 DIAGNOSIS — E7800 Pure hypercholesterolemia, unspecified: Secondary | ICD-10-CM | POA: Diagnosis not present

## 2017-07-23 DIAGNOSIS — E1121 Type 2 diabetes mellitus with diabetic nephropathy: Secondary | ICD-10-CM | POA: Diagnosis not present

## 2017-07-25 DIAGNOSIS — E875 Hyperkalemia: Secondary | ICD-10-CM | POA: Insufficient documentation

## 2017-07-25 DIAGNOSIS — R197 Diarrhea, unspecified: Secondary | ICD-10-CM | POA: Diagnosis not present

## 2017-07-25 DIAGNOSIS — Z23 Encounter for immunization: Secondary | ICD-10-CM | POA: Diagnosis not present

## 2017-07-25 DIAGNOSIS — Z6831 Body mass index (BMI) 31.0-31.9, adult: Secondary | ICD-10-CM | POA: Diagnosis not present

## 2017-07-25 DIAGNOSIS — R05 Cough: Secondary | ICD-10-CM | POA: Diagnosis not present

## 2017-07-25 DIAGNOSIS — R809 Proteinuria, unspecified: Secondary | ICD-10-CM | POA: Diagnosis not present

## 2017-07-25 DIAGNOSIS — E1121 Type 2 diabetes mellitus with diabetic nephropathy: Secondary | ICD-10-CM | POA: Diagnosis not present

## 2017-07-25 DIAGNOSIS — E1165 Type 2 diabetes mellitus with hyperglycemia: Secondary | ICD-10-CM | POA: Diagnosis not present

## 2017-07-25 DIAGNOSIS — I10 Essential (primary) hypertension: Secondary | ICD-10-CM | POA: Diagnosis not present

## 2017-09-10 DIAGNOSIS — Z1231 Encounter for screening mammogram for malignant neoplasm of breast: Secondary | ICD-10-CM | POA: Diagnosis not present

## 2017-11-21 DIAGNOSIS — E1121 Type 2 diabetes mellitus with diabetic nephropathy: Secondary | ICD-10-CM | POA: Diagnosis not present

## 2017-11-21 DIAGNOSIS — I10 Essential (primary) hypertension: Secondary | ICD-10-CM | POA: Diagnosis not present

## 2017-11-21 DIAGNOSIS — E7800 Pure hypercholesterolemia, unspecified: Secondary | ICD-10-CM | POA: Diagnosis not present

## 2017-11-21 DIAGNOSIS — E1122 Type 2 diabetes mellitus with diabetic chronic kidney disease: Secondary | ICD-10-CM | POA: Diagnosis not present

## 2017-11-21 DIAGNOSIS — N183 Chronic kidney disease, stage 3 (moderate): Secondary | ICD-10-CM | POA: Diagnosis not present

## 2017-11-21 DIAGNOSIS — E875 Hyperkalemia: Secondary | ICD-10-CM | POA: Diagnosis not present

## 2017-11-21 DIAGNOSIS — E1165 Type 2 diabetes mellitus with hyperglycemia: Secondary | ICD-10-CM | POA: Diagnosis not present

## 2017-11-26 DIAGNOSIS — E1121 Type 2 diabetes mellitus with diabetic nephropathy: Secondary | ICD-10-CM | POA: Diagnosis not present

## 2017-11-26 DIAGNOSIS — E875 Hyperkalemia: Secondary | ICD-10-CM | POA: Diagnosis not present

## 2017-11-26 DIAGNOSIS — E1165 Type 2 diabetes mellitus with hyperglycemia: Secondary | ICD-10-CM | POA: Diagnosis not present

## 2017-11-26 DIAGNOSIS — Z6830 Body mass index (BMI) 30.0-30.9, adult: Secondary | ICD-10-CM | POA: Diagnosis not present

## 2017-11-26 DIAGNOSIS — R05 Cough: Secondary | ICD-10-CM | POA: Diagnosis not present

## 2017-11-26 DIAGNOSIS — Z0001 Encounter for general adult medical examination with abnormal findings: Secondary | ICD-10-CM | POA: Diagnosis not present

## 2017-11-26 DIAGNOSIS — R0989 Other specified symptoms and signs involving the circulatory and respiratory systems: Secondary | ICD-10-CM | POA: Diagnosis not present

## 2017-11-26 DIAGNOSIS — M545 Low back pain: Secondary | ICD-10-CM | POA: Diagnosis not present

## 2017-11-26 DIAGNOSIS — I10 Essential (primary) hypertension: Secondary | ICD-10-CM | POA: Diagnosis not present

## 2017-11-26 DIAGNOSIS — R809 Proteinuria, unspecified: Secondary | ICD-10-CM | POA: Diagnosis not present

## 2017-11-26 DIAGNOSIS — R197 Diarrhea, unspecified: Secondary | ICD-10-CM | POA: Diagnosis not present

## 2017-11-26 DIAGNOSIS — E7800 Pure hypercholesterolemia, unspecified: Secondary | ICD-10-CM | POA: Diagnosis not present

## 2017-11-29 DIAGNOSIS — I6523 Occlusion and stenosis of bilateral carotid arteries: Secondary | ICD-10-CM | POA: Diagnosis not present

## 2017-11-29 DIAGNOSIS — I6521 Occlusion and stenosis of right carotid artery: Secondary | ICD-10-CM | POA: Diagnosis not present

## 2017-11-29 NOTE — Progress Notes (Deleted)
Cardiology Office Note  Date: 11/29/2017   ID: [name], DOB 1927/09/30, MRN [id number]  PCP: [name] Hilding, MD  Primary Cardiologist: [name] Lesches, MD   No chief complaint on file.   History of Present Illness: [name] is an 84 y.o. female last seen in July 2018.  Past Medical History:  Diagnosis Date  . Coronary atherosclerosis of native coronary artery    Nonobstructive  . Dyslipidemia   . PSVT (paroxysmal supraventricular tachycardia) ([place])   . TIA (transient ischemic attack)   . Type 2 diabetes mellitus ([place])    Diet controlled    Past Surgical History:  Procedure Laterality Date  . ABDOMINAL HYSTERECTOMY    . CARPAL TUNNEL RELEASE     x's 2  . CHOLECYSTECTOMY    . REPLACEMENT TOTAL KNEE  2002 & 2012  . SHOULDER SURGERY  2001    Current Outpatient Medications  Medication Sig Dispense Refill  . acetaminophen (TYLENOL) 500 MG tablet Take 1,000 mg by mouth 2 (two) times daily.    Marland Kitchen aspirin 81 MG tablet Take 162 mg by mouth at bedtime.    Marland Kitchen atenolol (TENORMIN) 50 MG tablet Take 1 tablet (50 mg total) by mouth daily. 90 tablet 3  . Calcium Carbonate-Vitamin D (CALTRATE 600+D) 600-400 MG-UNIT per tablet Take 1 tablet by mouth daily.     Marland Kitchen lisinopril (PRINIVIL,ZESTRIL) 20 MG tablet Take 20 mg by mouth daily.  3  . meclizine (ANTIVERT) 25 MG tablet Take 1 tablet by mouth 3 (three) times daily as needed.    . metFORMIN (GLUCOPHAGE-XR) 500 MG 24 hr tablet Take 2 tabs (1,000mg ) by mouth every morning & 1 tab (500mg ) every evening    . Multiple Vitamin (MULTIVITAMIN) tablet Take 1 tablet by mouth daily.      . nitroGLYCERIN (NITROSTAT) 0.4 MG SL tablet Place 1 tablet (0.4 mg total) under the tongue every 5 (five) minutes x 3 doses as needed. 25 tablet 3  . omeprazole (PRILOSEC OTC) 20 MG tablet Take 20 mg by mouth daily.      . simvastatin (ZOCOR) 10 MG tablet Take 10 mg by mouth at bedtime.       No current facility-administered medications for this visit.     Allergies:  Toprol xl [metoprolol succinate]   Social History: The patient  reports that  has never smoked. she has never used smokeless tobacco. She reports that she does not drink alcohol or use drugs.   Family History: The patient's family history includes Coronary artery disease in her mother.   ROS:  Please see the history of present illness. Otherwise, complete review of systems is positive for {NONE DEFAULTED:18576::"none"}.  All other systems are reviewed and negative.   Physical Exam: VS:  There were no vitals taken for this visit., BMI There is no height or weight on file to calculate BMI.  Wt Readings from Last 3 Encounters:  05/31/17 156 lb 9.6 oz (71 kg)  11/29/16 168 lb (76.2 kg)  06/01/16 161 lb 6.4 oz (73.2 kg)    General: Patient appears comfortable at rest. HEENT: Conjunctiva and lids normal, oropharynx clear with moist mucosa. Neck: Supple, no elevated JVP or carotid bruits, no thyromegaly. Lungs: Clear to auscultation, nonlabored breathing at rest. Cardiac: Regular rate and rhythm, no S3 or significant systolic murmur, no pericardial rub. Abdomen: Soft, nontender, no hepatomegaly, bowel sounds present, no guarding or rebound. Extremities: No pitting edema, distal pulses 2+. Skin: Warm and dry. Musculoskeletal:  No kyphosis. Neuropsychiatric: Alert and oriented x3, affect grossly appropriate.  ECG: I personally reviewed the tracing from 11/29/2016 which showed sinus rhythm with lead artifact.  Recent Labwork:   Other Studies Reviewed Today:   Assessment and Plan:    Current medicines were reviewed with the patient today.  No orders of the defined types were placed in this encounter.   Disposition:  Signed, Satira Sark, MD, [facility] 11/29/2017 4:02 PM    [place] at [place], [place], [place] [address] Phone: [phone]; Fax: [phone]

## 2017-12-02 ENCOUNTER — Ambulatory Visit: Payer: Medicare Other | Admitting: Cardiology

## 2017-12-31 NOTE — Progress Notes (Signed)
Cardiology Office Note  Date: 01/01/2018   ID: [name], DOB [birth year], MRN [id number]  PCP: Manon Hilding, MD  Primary Cardiologist: Rozann Lesches, MD   Chief Complaint  Patient presents with  . PSVT    History of Present Illness: [name] is an 84 y.o. female last seen in July 2018.  She is here today for a routine follow-up visit.  She reports only occasional brief palpitations, no prolonged events or chest pain.  She is limited by chronic arthritic back pain, uses a cane.  She has not had any interval falls.  Current cardiac regimen includes aspirin, atenolol, Hyzaar, Zocor, and as needed nitroglycerin.  She continues to follow with Dr. Quintin Alto for primary care.  Past Medical History:  Diagnosis Date  . Coronary atherosclerosis of native coronary artery    Nonobstructive  . Dyslipidemia   . PSVT (paroxysmal supraventricular tachycardia) ([employer])   . TIA (transient ischemic attack)   . Type 2 diabetes mellitus ([place])    Diet controlled    Past Surgical History:  Procedure Laterality Date  . ABDOMINAL HYSTERECTOMY    . CARPAL TUNNEL RELEASE     x's 2  . CHOLECYSTECTOMY    . REPLACEMENT TOTAL KNEE  2002 & 2012  . SHOULDER SURGERY  2001    Current Outpatient Medications  Medication Sig Dispense Refill  . acetaminophen (TYLENOL) 500 MG tablet Take 1,000 mg by mouth 2 (two) times daily.    Marland Kitchen aspirin 81 MG tablet Take 162 mg by mouth at bedtime.    Marland Kitchen atenolol (TENORMIN) 50 MG tablet Take 1 tablet (50 mg total) by mouth daily. 90 tablet 3  . Calcium Carbonate-Vitamin D (CALTRATE 600+D) 600-400 MG-UNIT per tablet Take 1 tablet by mouth daily.     Marland Kitchen losartan-hydrochlorothiazide (HYZAAR) 50-12.5 MG tablet Take 0.5 tablets by mouth daily.    . meclizine (ANTIVERT) 25 MG tablet Take 1 tablet by mouth 3 (three) times daily as needed.    . metFORMIN (GLUCOPHAGE-XR) 500 MG 24 hr tablet Take 2 tabs (1,000mg ) by mouth every morning & 1 tab (500mg ) every evening    .  Multiple Vitamin (MULTIVITAMIN) tablet Take 1 tablet by mouth daily.      . nitroGLYCERIN (NITROSTAT) 0.4 MG SL tablet Place 1 tablet (0.4 mg total) under the tongue every 5 (five) minutes x 3 doses as needed. 25 tablet 3  . omeprazole (PRILOSEC OTC) 20 MG tablet Take 20 mg by mouth daily.      . simvastatin (ZOCOR) 10 MG tablet Take 10 mg by mouth at bedtime.       No current facility-administered medications for this visit.    Allergies:  Toprol xl [metoprolol succinate]   Social History: The patient  reports that  has never smoked. she has never used smokeless tobacco. She reports that she does not drink alcohol or use drugs.   ROS:  Please see the history of present illness. Otherwise, complete review of systems is positive for chronic arthritic pain.  All other systems are reviewed and negative.   Physical Exam: VS:  BP 104/66   Pulse 72   Ht 5' (1.524 m)   Wt 150 lb (68 kg)   SpO2 96%   BMI 29.29 kg/m , BMI Body mass index is 29.29 kg/m.  Wt Readings from Last 3 Encounters:  01/01/18 150 lb (68 kg)  05/31/17 156 lb 9.6 oz (71 kg)  11/29/16 168 lb (76.2 kg)  General: Elderly woman using a cane. HEENT: Conjunctiva and lids normal, oropharynx clear. Neck: Supple, no elevated JVP or carotid bruits, no thyromegaly. Lungs: Clear to auscultation, nonlabored breathing at rest. Cardiac: Regular rate and rhythm, no S3 or significant systolic murmur. Abdomen: Soft, nontender, bowel sounds present. Extremities: No pitting edema, distal pulses 2+.  ECG: I personally reviewed the tracing from 11/29/2016 which showed probable sinus rhythm with lead motion artifact and low voltage.  Assessment and Plan:  1. PSVT, no progressive palpitations or obvious breakthrough events.  Continue atenolol and observation.  2.  Nonobstructive CAD by prior workup.  She reports no angina and continues on aspirin and statin therapy.  Current medicines were reviewed with the patient today.   Orders  Placed This Encounter  Procedures  . EKG 12-Lead    Disposition: Follow-up in 6 months.  Signed, Satira Sark, MD, [facility] 01/01/2018 3:54 PM    [place] at [place], [place], [place] [address] Phone: [phone]; Fax: [phone]

## 2018-01-01 ENCOUNTER — Ambulatory Visit (INDEPENDENT_AMBULATORY_CARE_PROVIDER_SITE_OTHER): Payer: Medicare Other | Admitting: Cardiology

## 2018-01-01 ENCOUNTER — Encounter: Payer: Self-pay | Admitting: Cardiology

## 2018-01-01 VITALS — BP 104/66 | HR 72 | Ht 60.0 in | Wt 150.0 lb

## 2018-01-01 DIAGNOSIS — I2510 Atherosclerotic heart disease of native coronary artery without angina pectoris: Secondary | ICD-10-CM | POA: Diagnosis not present

## 2018-01-01 DIAGNOSIS — I471 Supraventricular tachycardia: Secondary | ICD-10-CM | POA: Diagnosis not present

## 2018-01-01 NOTE — Patient Instructions (Signed)

## 2018-01-16 DIAGNOSIS — N362 Urethral caruncle: Secondary | ICD-10-CM | POA: Diagnosis not present

## 2018-01-29 DIAGNOSIS — N362 Urethral caruncle: Secondary | ICD-10-CM | POA: Diagnosis not present

## 2018-01-29 DIAGNOSIS — M810 Age-related osteoporosis without current pathological fracture: Secondary | ICD-10-CM | POA: Diagnosis not present

## 2018-01-29 DIAGNOSIS — E119 Type 2 diabetes mellitus without complications: Secondary | ICD-10-CM | POA: Diagnosis not present

## 2018-01-29 DIAGNOSIS — Z888 Allergy status to other drugs, medicaments and biological substances status: Secondary | ICD-10-CM | POA: Diagnosis not present

## 2018-01-29 DIAGNOSIS — I10 Essential (primary) hypertension: Secondary | ICD-10-CM | POA: Diagnosis not present

## 2018-01-29 DIAGNOSIS — N342 Other urethritis: Secondary | ICD-10-CM | POA: Diagnosis not present

## 2018-01-29 DIAGNOSIS — Z7982 Long term (current) use of aspirin: Secondary | ICD-10-CM | POA: Diagnosis not present

## 2018-01-29 DIAGNOSIS — K219 Gastro-esophageal reflux disease without esophagitis: Secondary | ICD-10-CM | POA: Diagnosis not present

## 2018-01-29 DIAGNOSIS — Z90710 Acquired absence of both cervix and uterus: Secondary | ICD-10-CM | POA: Diagnosis not present

## 2018-01-29 DIAGNOSIS — Z79899 Other long term (current) drug therapy: Secondary | ICD-10-CM | POA: Diagnosis not present

## 2018-01-29 DIAGNOSIS — M1990 Unspecified osteoarthritis, unspecified site: Secondary | ICD-10-CM | POA: Diagnosis not present

## 2018-01-29 DIAGNOSIS — Z7984 Long term (current) use of oral hypoglycemic drugs: Secondary | ICD-10-CM | POA: Diagnosis not present

## 2018-01-29 DIAGNOSIS — Z8673 Personal history of transient ischemic attack (TIA), and cerebral infarction without residual deficits: Secondary | ICD-10-CM | POA: Diagnosis not present

## 2018-01-29 DIAGNOSIS — N950 Postmenopausal bleeding: Secondary | ICD-10-CM | POA: Diagnosis not present

## 2018-01-29 DIAGNOSIS — Z96653 Presence of artificial knee joint, bilateral: Secondary | ICD-10-CM | POA: Diagnosis not present

## 2018-01-29 DIAGNOSIS — N368 Other specified disorders of urethra: Secondary | ICD-10-CM | POA: Diagnosis not present

## 2018-01-29 DIAGNOSIS — M109 Gout, unspecified: Secondary | ICD-10-CM | POA: Diagnosis not present

## 2018-02-26 DIAGNOSIS — Z09 Encounter for follow-up examination after completed treatment for conditions other than malignant neoplasm: Secondary | ICD-10-CM | POA: Diagnosis not present

## 2018-03-27 DIAGNOSIS — E1165 Type 2 diabetes mellitus with hyperglycemia: Secondary | ICD-10-CM | POA: Diagnosis not present

## 2018-03-27 DIAGNOSIS — E1121 Type 2 diabetes mellitus with diabetic nephropathy: Secondary | ICD-10-CM | POA: Diagnosis not present

## 2018-03-27 DIAGNOSIS — E875 Hyperkalemia: Secondary | ICD-10-CM | POA: Diagnosis not present

## 2018-03-27 DIAGNOSIS — I10 Essential (primary) hypertension: Secondary | ICD-10-CM | POA: Diagnosis not present

## 2018-03-27 DIAGNOSIS — E1122 Type 2 diabetes mellitus with diabetic chronic kidney disease: Secondary | ICD-10-CM | POA: Diagnosis not present

## 2018-03-27 DIAGNOSIS — N183 Chronic kidney disease, stage 3 (moderate): Secondary | ICD-10-CM | POA: Diagnosis not present

## 2018-03-27 DIAGNOSIS — E7800 Pure hypercholesterolemia, unspecified: Secondary | ICD-10-CM | POA: Diagnosis not present

## 2018-04-01 DIAGNOSIS — R809 Proteinuria, unspecified: Secondary | ICD-10-CM | POA: Diagnosis not present

## 2018-04-01 DIAGNOSIS — R197 Diarrhea, unspecified: Secondary | ICD-10-CM | POA: Diagnosis not present

## 2018-04-01 DIAGNOSIS — Z1389 Encounter for screening for other disorder: Secondary | ICD-10-CM | POA: Diagnosis not present

## 2018-04-01 DIAGNOSIS — Z6830 Body mass index (BMI) 30.0-30.9, adult: Secondary | ICD-10-CM | POA: Diagnosis not present

## 2018-04-01 DIAGNOSIS — Z1331 Encounter for screening for depression: Secondary | ICD-10-CM | POA: Diagnosis not present

## 2018-04-01 DIAGNOSIS — E1121 Type 2 diabetes mellitus with diabetic nephropathy: Secondary | ICD-10-CM | POA: Diagnosis not present

## 2018-04-01 DIAGNOSIS — E1165 Type 2 diabetes mellitus with hyperglycemia: Secondary | ICD-10-CM | POA: Diagnosis not present

## 2018-04-01 DIAGNOSIS — I10 Essential (primary) hypertension: Secondary | ICD-10-CM | POA: Diagnosis not present

## 2018-04-17 DIAGNOSIS — H40053 Ocular hypertension, bilateral: Secondary | ICD-10-CM | POA: Diagnosis not present

## 2018-07-03 NOTE — Progress Notes (Signed)
Cardiology Office Note  Date: 07/04/2018   ID: [name], DOB January 24, 1927, MRN [id number]  PCP: Manon Hilding, MD  Primary Cardiologist: Rozann Lesches, MD   Chief Complaint  Patient presents with  . PSVT    History of Present Illness: [name] is an 84 y.o. female last seen in February.  She is here for a routine follow-up visit.  She reports no palpitations or chest pain, continues to tolerate atenolol well.  She has significant limitations related to arthritic pains, uses a cane, but does try to get out and walk in the early morning on her property.  She denies any recent falls.  Present cardiac regimen includes aspirin, atenolol, Hyzaar, and Zocor.  She has nitroglycerin available.  She continues to follow with Dr. Quintin Alto for routine lab work.  Past Medical History:  Diagnosis Date  . Coronary atherosclerosis of native coronary artery    Nonobstructive  . Dyslipidemia   . PSVT (paroxysmal supraventricular tachycardia) ([place])   . TIA (transient ischemic attack)   . Type 2 diabetes mellitus ([place])    Diet controlled    Past Surgical History:  Procedure Laterality Date  . ABDOMINAL HYSTERECTOMY    . CARPAL TUNNEL RELEASE     x's 2  . CHOLECYSTECTOMY    . REPLACEMENT TOTAL KNEE  2002 & 2012  . SHOULDER SURGERY  2001    Current Outpatient Medications  Medication Sig Dispense Refill  . acetaminophen (TYLENOL) 500 MG tablet Take 1,000 mg by mouth 2 (two) times daily.    Marland Kitchen aspirin 81 MG tablet Take 162 mg by mouth at bedtime.    Marland Kitchen atenolol (TENORMIN) 50 MG tablet Take 1 tablet (50 mg total) by mouth daily. 90 tablet 3  . Calcium Carbonate-Vitamin D (CALTRATE 600+D) 600-400 MG-UNIT per tablet Take 1 tablet by mouth daily.     Marland Kitchen losartan-hydrochlorothiazide (HYZAAR) 50-12.5 MG tablet Take 0.5 tablets by mouth daily.    . meclizine (ANTIVERT) 25 MG tablet Take 1 tablet by mouth 3 (three) times daily as needed.    . metFORMIN (GLUCOPHAGE-XR) 500 MG 24 hr tablet Take 2  tabs (1,000mg ) by mouth every morning & 1 tab (500mg ) every evening    . Multiple Vitamin (MULTIVITAMIN) tablet Take 1 tablet by mouth daily.      . nitroGLYCERIN (NITROSTAT) 0.4 MG SL tablet Place 1 tablet (0.4 mg total) under the tongue every 5 (five) minutes x 3 doses as needed. 25 tablet 3  . simvastatin (ZOCOR) 10 MG tablet Take 10 mg by mouth at bedtime.       No current facility-administered medications for this visit.    Allergies:  Toprol xl [metoprolol succinate]   Social History: The patient  reports that she has never smoked. She has never used smokeless tobacco. She reports that she does not drink alcohol or use drugs.   ROS:  Please see the history of present illness. Otherwise, complete review of systems is positive for chronic arthritic pain.  All other systems are reviewed and negative.   Physical Exam: VS:  BP 130/62   Pulse 61   Ht 5' (1.524 m)   Wt 152 lb (68.9 kg)   SpO2 96%   BMI 29.69 kg/m , BMI Body mass index is 29.69 kg/m.  Wt Readings from Last 3 Encounters:  07/04/18 152 lb (68.9 kg)  01/01/18 150 lb (68 kg)  05/31/17 156 lb 9.6 oz (71 kg)    General: Elderly woman, appears  comfortable at rest.  Using a cane. HEENT: Conjunctiva and lids normal, oropharynx clear. Neck: Supple, no elevated JVP or carotid bruits, no thyromegaly. Lungs: Clear to auscultation, nonlabored breathing at rest. Cardiac: Regular rate and rhythm, no S3 or significant systolic murmur. Abdomen: Soft, nontender, bowel sounds present, no guarding or rebound. Extremities: No pitting edema, distal pulses 2+.  ECG: I personally reviewed the tracing from 01/01/2018 which showed sinus rhythm with low voltage.  Assessment and Plan:  1.  PSVT, quiescent on atenolol.  Continue observation.  2.  Mild coronary atherosclerosis, asymptomatic.  Continue aspirin and statin therapy.  These are followed by Dr. Quintin Alto.  Current medicines were reviewed with the patient today.  Disposition:  Follow-up in 1 year, sooner if needed.  Signed, Satira Sark, MD, [facility] 07/04/2018 10:59 AM    [facility] at [place], [place], [place] [address] Phone: [phone]; Fax: [phone]

## 2018-07-04 ENCOUNTER — Ambulatory Visit (INDEPENDENT_AMBULATORY_CARE_PROVIDER_SITE_OTHER): Payer: Medicare Other | Admitting: Cardiology

## 2018-07-04 ENCOUNTER — Encounter: Payer: Self-pay | Admitting: Cardiology

## 2018-07-04 VITALS — BP 130/62 | HR 61 | Ht 60.0 in | Wt 152.0 lb

## 2018-07-04 DIAGNOSIS — I2510 Atherosclerotic heart disease of native coronary artery without angina pectoris: Secondary | ICD-10-CM

## 2018-07-04 DIAGNOSIS — I471 Supraventricular tachycardia: Secondary | ICD-10-CM | POA: Diagnosis not present

## 2018-07-04 NOTE — Patient Instructions (Signed)

## 2018-07-31 DIAGNOSIS — E875 Hyperkalemia: Secondary | ICD-10-CM | POA: Diagnosis not present

## 2018-07-31 DIAGNOSIS — N183 Chronic kidney disease, stage 3 (moderate): Secondary | ICD-10-CM | POA: Diagnosis not present

## 2018-07-31 DIAGNOSIS — E1121 Type 2 diabetes mellitus with diabetic nephropathy: Secondary | ICD-10-CM | POA: Diagnosis not present

## 2018-07-31 DIAGNOSIS — E1122 Type 2 diabetes mellitus with diabetic chronic kidney disease: Secondary | ICD-10-CM | POA: Diagnosis not present

## 2018-07-31 DIAGNOSIS — I10 Essential (primary) hypertension: Secondary | ICD-10-CM | POA: Diagnosis not present

## 2018-07-31 DIAGNOSIS — E7801 Familial hypercholesterolemia: Secondary | ICD-10-CM | POA: Diagnosis not present

## 2018-07-31 DIAGNOSIS — E1165 Type 2 diabetes mellitus with hyperglycemia: Secondary | ICD-10-CM | POA: Diagnosis not present

## 2018-07-31 DIAGNOSIS — R0989 Other specified symptoms and signs involving the circulatory and respiratory systems: Secondary | ICD-10-CM | POA: Diagnosis not present

## 2018-08-05 DIAGNOSIS — Z23 Encounter for immunization: Secondary | ICD-10-CM | POA: Diagnosis not present

## 2018-08-05 DIAGNOSIS — E1165 Type 2 diabetes mellitus with hyperglycemia: Secondary | ICD-10-CM | POA: Diagnosis not present

## 2018-08-05 DIAGNOSIS — E7800 Pure hypercholesterolemia, unspecified: Secondary | ICD-10-CM | POA: Diagnosis not present

## 2018-08-05 DIAGNOSIS — Z6830 Body mass index (BMI) 30.0-30.9, adult: Secondary | ICD-10-CM | POA: Diagnosis not present

## 2018-08-05 DIAGNOSIS — R0989 Other specified symptoms and signs involving the circulatory and respiratory systems: Secondary | ICD-10-CM | POA: Diagnosis not present

## 2018-08-05 DIAGNOSIS — E1121 Type 2 diabetes mellitus with diabetic nephropathy: Secondary | ICD-10-CM | POA: Diagnosis not present

## 2018-08-05 DIAGNOSIS — E1140 Type 2 diabetes mellitus with diabetic neuropathy, unspecified: Secondary | ICD-10-CM | POA: Diagnosis not present

## 2018-08-05 DIAGNOSIS — H5000 Unspecified esotropia: Secondary | ICD-10-CM | POA: Diagnosis not present

## 2018-08-05 DIAGNOSIS — I10 Essential (primary) hypertension: Secondary | ICD-10-CM | POA: Diagnosis not present

## 2018-09-11 DIAGNOSIS — Z1231 Encounter for screening mammogram for malignant neoplasm of breast: Secondary | ICD-10-CM | POA: Diagnosis not present

## 2018-09-19 DIAGNOSIS — Z4689 Encounter for fitting and adjustment of other specified devices: Secondary | ICD-10-CM | POA: Diagnosis not present

## 2018-09-19 DIAGNOSIS — N939 Abnormal uterine and vaginal bleeding, unspecified: Secondary | ICD-10-CM | POA: Diagnosis not present

## 2018-09-19 DIAGNOSIS — N814 Uterovaginal prolapse, unspecified: Secondary | ICD-10-CM | POA: Diagnosis not present

## 2018-10-28 DIAGNOSIS — N8110 Cystocele, unspecified: Secondary | ICD-10-CM | POA: Diagnosis not present

## 2018-10-28 DIAGNOSIS — Z4689 Encounter for fitting and adjustment of other specified devices: Secondary | ICD-10-CM | POA: Diagnosis not present

## 2018-12-02 DIAGNOSIS — N183 Chronic kidney disease, stage 3 (moderate): Secondary | ICD-10-CM | POA: Diagnosis not present

## 2018-12-02 DIAGNOSIS — E1121 Type 2 diabetes mellitus with diabetic nephropathy: Secondary | ICD-10-CM | POA: Diagnosis not present

## 2018-12-02 DIAGNOSIS — E7800 Pure hypercholesterolemia, unspecified: Secondary | ICD-10-CM | POA: Diagnosis not present

## 2018-12-02 DIAGNOSIS — I10 Essential (primary) hypertension: Secondary | ICD-10-CM | POA: Diagnosis not present

## 2018-12-02 DIAGNOSIS — E1165 Type 2 diabetes mellitus with hyperglycemia: Secondary | ICD-10-CM | POA: Diagnosis not present

## 2018-12-02 DIAGNOSIS — E1122 Type 2 diabetes mellitus with diabetic chronic kidney disease: Secondary | ICD-10-CM | POA: Diagnosis not present

## 2018-12-02 DIAGNOSIS — E7801 Familial hypercholesterolemia: Secondary | ICD-10-CM | POA: Diagnosis not present

## 2018-12-09 DIAGNOSIS — Z6829 Body mass index (BMI) 29.0-29.9, adult: Secondary | ICD-10-CM | POA: Diagnosis not present

## 2018-12-09 DIAGNOSIS — I10 Essential (primary) hypertension: Secondary | ICD-10-CM | POA: Diagnosis not present

## 2018-12-09 DIAGNOSIS — Z0000 Encounter for general adult medical examination without abnormal findings: Secondary | ICD-10-CM | POA: Diagnosis not present

## 2019-01-13 DIAGNOSIS — Z6829 Body mass index (BMI) 29.0-29.9, adult: Secondary | ICD-10-CM | POA: Diagnosis not present

## 2019-01-13 DIAGNOSIS — K625 Hemorrhage of anus and rectum: Secondary | ICD-10-CM | POA: Diagnosis not present

## 2019-01-19 ENCOUNTER — Encounter (INDEPENDENT_AMBULATORY_CARE_PROVIDER_SITE_OTHER): Payer: Self-pay | Admitting: Internal Medicine

## 2019-01-19 ENCOUNTER — Ambulatory Visit (INDEPENDENT_AMBULATORY_CARE_PROVIDER_SITE_OTHER): Payer: Medicare Other | Admitting: Internal Medicine

## 2019-01-19 VITALS — BP 144/77 | HR 96 | Temp 97.3°F | Ht 60.0 in | Wt 150.8 lb

## 2019-01-19 DIAGNOSIS — K625 Hemorrhage of anus and rectum: Secondary | ICD-10-CM | POA: Diagnosis not present

## 2019-01-19 NOTE — Patient Instructions (Signed)
Discussed with Dr. Rehman.  

## 2019-01-19 NOTE — Progress Notes (Addendum)
Subjective:    Patient ID: [name], female    DOB: 08-Nov-1927, 85 y.o.   MRN: [id number]  HPI Referred by Dr. Quintin Alto for rectal bleeding. Symptoms for about 4 weeks. Bleeding intermittent. Rectal exam normal at Dr. Quintin Alto.  Hx of hemorrhoids. Her appetite is good. No weight loss. Has a BM daily. Stools are much softer since starting the stool softener. No bleeding in 1 1/2 weeks.  No family hx of colon cancer. Her last colonoscopy was in 2015 by Dr. Britta Mccreedy. (rectal bleeding).  Mild diverticulosis in sigmoid colon. Ulcer found in the rectum with elevated borders, 1.5cm in size, 9 cm from the anal verge, soft. Multiple biopsies.  Severe active chronic colitis with ulceration. No malignancy identified.    Review of Systems Past Medical History:  Diagnosis Date  . Coronary atherosclerosis of native coronary artery    Nonobstructive  . Dyslipidemia   . PSVT (paroxysmal supraventricular tachycardia) ([place])   . TIA (transient ischemic attack)   . Type 2 diabetes mellitus ([place])    Diet controlled    Past Surgical History:  Procedure Laterality Date  . ABDOMINAL HYSTERECTOMY    . CARPAL TUNNEL RELEASE     x's 2  . CHOLECYSTECTOMY    . REPLACEMENT TOTAL KNEE  2002 & 2012  . SHOULDER SURGERY  2001    Allergies  Allergen Reactions  . Toprol Xl [Metoprolol Succinate] Other (See Comments)    HAIR LOSS    Current Outpatient Medications on File Prior to Visit  Medication Sig Dispense Refill  . acetaminophen (TYLENOL) 500 MG tablet Take 1,000 mg by mouth 2 (two) times daily.    Marland Kitchen aspirin 81 MG tablet Take 162 mg by mouth at bedtime.    Marland Kitchen atenolol (TENORMIN) 50 MG tablet Take 1 tablet (50 mg total) by mouth daily. 90 tablet 3  . Calcium Carbonate-Vitamin D (CALTRATE 600+D) 600-400 MG-UNIT per tablet Take 1 tablet by mouth daily.     Marland Kitchen docusate sodium (COLACE) 100 MG capsule Take 100 mg by mouth daily.    . hydrochlorothiazide (HYDRODIURIL) 12.5 MG tablet Take 12.5 mg by mouth 2 (two)  times daily after a meal.    . losartan (COZAAR) 25 MG tablet Take 25 mg by mouth daily.    . meclizine (ANTIVERT) 25 MG tablet Take 1 tablet by mouth 3 (three) times daily as needed.    . metFORMIN (GLUCOPHAGE-XR) 500 MG 24 hr tablet Take 2 tabs (1,000mg ) by mouth every morning & 1 tab (500mg ) every evening    . Multiple Vitamin (MULTIVITAMIN) tablet Take 1 tablet by mouth daily.      . multivitamin-lutein (OCUVITE-LUTEIN) CAPS capsule Take 1 capsule by mouth daily.    . simvastatin (ZOCOR) 10 MG tablet Take 10 mg by mouth at bedtime.       No current facility-administered medications on file prior to visit.         Objective:   Physical Exam Blood pressure (!) 144/77, pulse 96, temperature (!) 97.3 F (36.3 C), height 5' (1.524 m), weight 150 lb 12.8 oz (68.4 kg). Alert and oriented. Skin warm and dry. Oral mucosa is moist.   . Sclera anicteric, conjunctivae is pink. Thyroid not enlarged. No cervical lymphadenopathy. Lungs clear. Heart regular rate and rhythm.  Abdomen is soft. Bowel sounds are positive. No hepatomegaly. No abdominal masses felt. No tenderness.  No edema to lower extremities         Assessment & Plan:  Rectal bleeding  with constipation. No rectal bleeding in 1 1/2 weeks since starting stool softener. [place] colonoscopy in 2015 by Dr. Britta Mccreedy and was normal.  I discussed with Dr. Laural Golden.  Rectal bleeding probably from the constipation. She is doing well. Will see back as needed.

## 2019-04-02 DIAGNOSIS — M1991 Primary osteoarthritis, unspecified site: Secondary | ICD-10-CM | POA: Diagnosis not present

## 2019-04-02 DIAGNOSIS — E1122 Type 2 diabetes mellitus with diabetic chronic kidney disease: Secondary | ICD-10-CM | POA: Diagnosis not present

## 2019-04-02 DIAGNOSIS — N183 Chronic kidney disease, stage 3 (moderate): Secondary | ICD-10-CM | POA: Diagnosis not present

## 2019-04-02 DIAGNOSIS — R0989 Other specified symptoms and signs involving the circulatory and respiratory systems: Secondary | ICD-10-CM | POA: Diagnosis not present

## 2019-04-02 DIAGNOSIS — I10 Essential (primary) hypertension: Secondary | ICD-10-CM | POA: Diagnosis not present

## 2019-04-02 DIAGNOSIS — E875 Hyperkalemia: Secondary | ICD-10-CM | POA: Diagnosis not present

## 2019-04-02 DIAGNOSIS — R809 Proteinuria, unspecified: Secondary | ICD-10-CM | POA: Diagnosis not present

## 2019-04-02 DIAGNOSIS — E1140 Type 2 diabetes mellitus with diabetic neuropathy, unspecified: Secondary | ICD-10-CM | POA: Diagnosis not present

## 2019-04-02 DIAGNOSIS — E7800 Pure hypercholesterolemia, unspecified: Secondary | ICD-10-CM | POA: Diagnosis not present

## 2019-04-06 DIAGNOSIS — Z6829 Body mass index (BMI) 29.0-29.9, adult: Secondary | ICD-10-CM | POA: Diagnosis not present

## 2019-04-06 DIAGNOSIS — E1140 Type 2 diabetes mellitus with diabetic neuropathy, unspecified: Secondary | ICD-10-CM | POA: Diagnosis not present

## 2019-04-06 DIAGNOSIS — E1165 Type 2 diabetes mellitus with hyperglycemia: Secondary | ICD-10-CM | POA: Diagnosis not present

## 2019-04-06 DIAGNOSIS — E7800 Pure hypercholesterolemia, unspecified: Secondary | ICD-10-CM | POA: Diagnosis not present

## 2019-04-06 DIAGNOSIS — R0989 Other specified symptoms and signs involving the circulatory and respiratory systems: Secondary | ICD-10-CM | POA: Diagnosis not present

## 2019-04-06 DIAGNOSIS — I10 Essential (primary) hypertension: Secondary | ICD-10-CM | POA: Diagnosis not present

## 2019-04-06 DIAGNOSIS — N183 Chronic kidney disease, stage 3 (moderate): Secondary | ICD-10-CM | POA: Diagnosis not present

## 2019-07-08 ENCOUNTER — Telehealth: Payer: Self-pay | Admitting: Cardiology

## 2019-07-08 NOTE — Telephone Encounter (Signed)
Virtual Visit Pre-Appointment Phone Call  "(Name), I am calling you today to discuss your upcoming appointment. We are currently trying to limit exposure to the virus that causes COVID-19 by seeing patients at home rather than in the office."  1. "What is the BEST phone number to call the day of the visit?" - include this in appointment notes  2. Do you have or have access to (through a family member/friend) a smartphone with video capability that we can use for your visit?" a. If yes - list this number in appt notes as cell (if different from BEST phone #) and list the appointment type as a VIDEO visit in appointment notes b. If no - list the appointment type as a PHONE visit in appointment notes  Confirm consent - "In the setting of the current Covid19 crisis, you are scheduled for a (phone or video) visit with your provider on (date) at (time).  Just as we do with many in-office visits, in order for you to participate in this visit, we must obtain consent.  If you'd like, I can send this to your mychart (if signed up) or email for you to review.  Otherwise, I can obtain your verbal consent now.  All virtual visits are billed to your insurance company just like a normal visit would be.  By agreeing to a virtual visit, we'd like you to understand that the technology does not allow for your provider to perform an examination, and thus may limit your provider's ability to fully assess your condition. If your provider identifies any concerns that need to be evaluated in person, we will make arrangements to do so.  Finally, though the technology is pretty good, we cannot assure that it will always work on either your or our end, and in the setting of a video visit, we may have to convert it to a phone-only visit.  In either situation, we cannot ensure that we have a secure connection.  Are you willing to proceed?" STAFF: Did the patient verbally acknowledge consent to telehealth visit? Document  YES/NO here: yes 3. Advise patient to be prepared - "Two hours prior to your appointment, go ahead and check your blood pressure, pulse, oxygen saturation, and your weight (if you have the equipment to check those) and write them all down. When your visit starts, your provider will ask you for this information. If you have an Apple Watch or Kardia device, please plan to have heart rate information ready on the day of your appointment. Please have a pen and paper handy nearby the day of the visit as well."  4. Give patient instructions for MyChart download to smartphone OR Doximity/Doxy.me as below if video visit (depending on what platform provider is using)  5. Inform patient they will receive a phone call 15 minutes prior to their appointment time (may be from unknown caller ID) so they should be prepared to answer    TELEPHONE CALL NOTE  [name] has been deemed a candidate for a follow-up tele-health visit to limit community exposure during the Covid-19 pandemic. I spoke with the patient via phone to ensure availability of phone/video source, confirm preferred email & phone number, and discuss instructions and expectations.  I reminded Naima Danbury Kiesling to be prepared with any vital sign and/or heart rhythm information that could potentially be obtained via home monitoring, at the time of her visit. I reminded JERRIKA BEAGLEY to expect a phone call prior to her visit.  Weston Anna 07/08/2019 2:57 PM   INSTRUCTIONS FOR DOWNLOADING THE MYCHART APP TO SMARTPHONE  - The patient must first make sure to have activated MyChart and know their login information - If Apple, go to [employer] and type in MyChart in the search bar and download the app. If Android, ask patient to go to [employer] and type in [place] in the search bar and download the app. The app is free but as with any other app downloads, their phone may require them to verify saved payment information or Apple/Android password.    - The patient will need to then log into the app with their MyChart username and password, and select [facility] as their healthcare provider to link the account. When it is time for your visit, go to the MyChart app, find appointments, and click Begin Video Visit. Be sure to Select Allow for your device to access the Microphone and Camera for your visit. You will then be connected, and your provider will be with you shortly.  **If they have any issues connecting, or need assistance please contact MyChart service desk [phone])**  **If using a computer, in order to ensure the best quality for their visit they will need to use either of the following Internet Browsers: [employer], or Google Chrome**  IF USING DOXIMITY or DOXY.ME - The patient will receive a link just prior to their visit by text.     FULL LENGTH CONSENT FOR TELE-HEALTH VISIT   I hereby voluntarily request, consent and authorize [place] and its employed or contracted physicians, physician assistants, nurse practitioners or other licensed health care professionals (the Practitioner), to provide me with telemedicine health care services (the Services") as deemed necessary by the treating Practitioner. I acknowledge and consent to receive the Services by the Practitioner via telemedicine. I understand that the telemedicine visit will involve communicating with the Practitioner through live audiovisual communication technology and the disclosure of certain medical information by electronic transmission. I acknowledge that I have been given the opportunity to request an in-person assessment or other available alternative prior to the telemedicine visit and am voluntarily participating in the telemedicine visit.  I understand that I have the right to withhold or withdraw my consent to the use of telemedicine in the course of my care at any time, without affecting my right to future care or treatment, and that  the Practitioner or I may terminate the telemedicine visit at any time. I understand that I have the right to inspect all information obtained and/or recorded in the course of the telemedicine visit and may receive copies of available information for a reasonable fee.  I understand that some of the potential risks of receiving the Services via telemedicine include:   Delay or interruption in medical evaluation due to technological equipment failure or disruption;  Information transmitted may not be sufficient (e.g. poor resolution of images) to allow for appropriate medical decision making by the Practitioner; and/or   In rare instances, security protocols could fail, causing a breach of personal health information.  Furthermore, I acknowledge that it is my responsibility to provide information about my medical history, conditions and care that is complete and accurate to the best of my ability. I acknowledge that Practitioner's advice, recommendations, and/or decision may be based on factors not within their control, such as incomplete or inaccurate data provided by me or distortions of diagnostic images or specimens that may result from electronic transmissions. I understand that  the practice of medicine is not an exact science and that Practitioner makes no warranties or guarantees regarding treatment outcomes. I acknowledge that I will receive a copy of this consent concurrently upon execution via email to the email address I last provided but may also request a printed copy by calling the office of [facility].    I understand that my insurance will be billed for this visit.   I have read or had this consent read to me.  I understand the contents of this consent, which adequately explains the benefits and risks of the Services being provided via telemedicine.   I have been provided ample opportunity to ask questions regarding this consent and the Services and have had my questions answered to  my satisfaction.  I give my informed consent for the services to be provided through the use of telemedicine in my medical care  By participating in this telemedicine visit I agree to the above.

## 2019-07-13 NOTE — Progress Notes (Signed)
Cardiology Office Note  Date: 07/14/2019   ID: [name], DOB 08-11-1927, MRN [id number]  PCP:  Manon Hilding, MD  Cardiologist:  Rozann Lesches, MD Electrophysiologist:  None   Chief Complaint  Patient presents with  . Cardiac follow-up    History of Present Illness: [name] is a 85 y.o. female last seen in August 2019.  She is here today with her daughter for a follow-up visit.  She has been doing well until this past weekend.  She states that she woke up early morning hours on Sunday with a feeling of indigestion on the left side of her chest.  She was short of breath as well, not necessarily sense of palpitations.  This went on for a few hours, she did take nitroglycerin x2 and ultimately the symptoms resolved.  She had a mild sense of recurrence on Monday, but since then has been back to normal.  She walks 2/5 of a mile in her driveway every day, has not had similar symptoms with activity.  I reviewed her medications which are stable and outlined below.  Cardiac regimen is unchanged.  We did discuss getting a fresh bottle of nitroglycerin.  She has been social distancing, rarely goes out of the house.  Her daughter gets all of her groceries.  I personally reviewed her ECG today which shows sinus rhythm with PVC.  Past Medical History:  Diagnosis Date  . Coronary atherosclerosis of native coronary artery    Nonobstructive  . Dyslipidemia   . PSVT (paroxysmal supraventricular tachycardia) ([place])   . TIA (transient ischemic attack)   . Type 2 diabetes mellitus ([place])    Diet controlled    Past Surgical History:  Procedure Laterality Date  . ABDOMINAL HYSTERECTOMY    . CARPAL TUNNEL RELEASE     x's 2  . CHOLECYSTECTOMY    . REPLACEMENT TOTAL KNEE  2002 & 2012  . SHOULDER SURGERY  2001    Current Outpatient Medications  Medication Sig Dispense Refill  . acetaminophen (TYLENOL) 500 MG tablet Take 1,000 mg by mouth 2 (two) times daily.    Marland Kitchen aspirin 81 MG  tablet Take 162 mg by mouth at bedtime.    Marland Kitchen atenolol (TENORMIN) 50 MG tablet Take 1 tablet (50 mg total) by mouth daily. 90 tablet 3  . Calcium Carbonate-Vitamin D (CALTRATE 600+D) 600-400 MG-UNIT per tablet Take 1 tablet by mouth daily.     Marland Kitchen docusate sodium (COLACE) 100 MG capsule Take 100 mg by mouth daily as needed.     . hydrochlorothiazide (HYDRODIURIL) 12.5 MG tablet Take 12.5 mg by mouth daily.     Marland Kitchen losartan (COZAAR) 25 MG tablet Take 25 mg by mouth daily.    . meclizine (ANTIVERT) 25 MG tablet Take 1 tablet by mouth 3 (three) times daily as needed.    . metFORMIN (GLUCOPHAGE-XR) 500 MG 24 hr tablet Take 2 tabs (1,000mg ) by mouth every morning & 1 tab (500mg ) every evening    . Multiple Vitamin (MULTIVITAMIN) tablet Take 1 tablet by mouth daily.      . multivitamin-lutein (OCUVITE-LUTEIN) CAPS capsule Take 1 capsule by mouth daily.    . simvastatin (ZOCOR) 10 MG tablet Take 10 mg by mouth at bedtime.      . nitroGLYCERIN (NITROSTAT) 0.4 MG SL tablet Place 1 tablet (0.4 mg total) under the tongue every 5 (five) minutes x 3 doses as needed for chest pain (if no relief after 3rd dose, proceed to  the ED for an evaluation or call 911). 25 tablet 3   No current facility-administered medications for this visit.    Allergies:  Toprol xl [metoprolol succinate]   Social History: The patient  reports that she has never smoked. She has never used smokeless tobacco. She reports that she does not drink alcohol or use drugs.   Family History: The patient's family history includes Coronary artery disease in her mother.   ROS:  Please see the history of present illness. Otherwise, complete review of systems is positive for none.  All other systems are reviewed and negative.   Physical Exam: VS:  BP 112/70   Pulse 71   Ht 5' (1.524 m)   Wt 150 lb (68 kg)   SpO2 98%   BMI 29.29 kg/m , BMI Body mass index is 29.29 kg/m.  Wt Readings from Last 3 Encounters:  07/14/19 150 lb (68 kg)  01/19/19  150 lb 12.8 oz (68.4 kg)  07/04/18 152 lb (68.9 kg)    General: A woman, appears comfortable at rest.  Using a cane. HEENT: Conjunctiva and lids normal, wearing a mask. Neck: Supple, no elevated JVP or carotid bruits, no thyromegaly. Lungs: Clear to auscultation, nonlabored breathing at rest. Cardiac: Regular rate and rhythm, no S3 or significant systolic murmur. Abdomen: Soft, nontender, bowel sounds present. Extremities: No pitting edema, distal pulses 2+. Skin: Warm and dry. Musculoskeletal: No kyphosis. Neuropsychiatric: Alert and oriented x3, affect grossly appropriate.  ECG:  An ECG dated 01/01/2018 was personally reviewed today and demonstrated:  Sinus rhythm with low voltage.  Recent Labwork:  February 2020: Hemoglobin 12.0, platelets 280  Other Studies Reviewed Today:  No interval cardiac testing for review.  Assessment and Plan:  1.  Recent onset chest discomfort, presently resolved.  She has a history of mild coronary atherosclerosis by prior evaluation.  ECG reviewed and shows no acute ST segment changes with single PVC.  We have discussed options, and at this point she would prefer observation on medical therapy.  If symptoms recur or escalate, will need to pursue follow-up ischemic evaluation.  I did talk with her about a [employer] as far as outpatient work-up.  2.  PSVT.  No definite palpitations with recent symptoms.  We will continue atenolol and observation.  3.  Mixed hyperlipidemia on Zocor.  She continues to follow with Dr. Quintin Alto.  Medication Adjustments/Labs and Tests Ordered: Current medicines are reviewed at length with the patient today.  Concerns regarding medicines are outlined above.   Tests Ordered: Orders Placed This Encounter  Procedures  . EKG 12-Lead    Medication Changes: Meds ordered this encounter  Medications  . nitroGLYCERIN (NITROSTAT) 0.4 MG SL tablet    Sig: Place 1 tablet (0.4 mg total) under the tongue every 5 (five)  minutes x 3 doses as needed for chest pain (if no relief after 3rd dose, proceed to the ED for an evaluation or call 911).    Dispense:  25 tablet    Refill:  3    Disposition: Patient to call back if symptoms continue.  Otherwise we will schedule a tentative routine visit in 1 year.  Signed, Satira Sark, MD, [facility] 07/14/2019 10:12 AM    [facility] at [place], [place], [place] [address] Phone: [phone]; Fax: [phone]

## 2019-07-14 ENCOUNTER — Encounter: Payer: Self-pay | Admitting: Cardiology

## 2019-07-14 ENCOUNTER — Other Ambulatory Visit: Payer: Self-pay

## 2019-07-14 ENCOUNTER — Ambulatory Visit (INDEPENDENT_AMBULATORY_CARE_PROVIDER_SITE_OTHER): Payer: Medicare Other | Admitting: Cardiology

## 2019-07-14 VITALS — BP 112/70 | HR 71 | Ht 60.0 in | Wt 150.0 lb

## 2019-07-14 DIAGNOSIS — E782 Mixed hyperlipidemia: Secondary | ICD-10-CM

## 2019-07-14 DIAGNOSIS — I25119 Atherosclerotic heart disease of native coronary artery with unspecified angina pectoris: Secondary | ICD-10-CM

## 2019-07-14 DIAGNOSIS — I471 Supraventricular tachycardia: Secondary | ICD-10-CM

## 2019-07-14 DIAGNOSIS — R079 Chest pain, unspecified: Secondary | ICD-10-CM

## 2019-07-14 MED ORDER — NITROGLYCERIN 0.4 MG SL SUBL: 0.4000 mg | SUBLINGUAL_TABLET | SUBLINGUAL | 3 refills | Status: DC | PRN

## 2019-07-14 NOTE — Patient Instructions (Signed)
Medication Instructions:   Your physician recommends that you continue on your current medications as directed. Please refer to the Current Medication list given to you today.  Labwork:  NONE  Testing/Procedures:  NONE  Follow-Up:  Your physician recommends that you schedule a follow-up appointment in: 1 year. You will receive a reminder letter in the mail in about 10 months reminding you to call and schedule your appointment. If you don't receive this letter, please contact our office.  Any Other Special Instructions Will Be Listed Below (If Applicable).  Please continue to monitor your symptoms and contact our office if they return or get worse.  If you need a refill on your cardiac medications before your next appointment, please call your pharmacy.

## 2019-07-15 ENCOUNTER — Telehealth: Payer: Self-pay | Admitting: Cardiology

## 2019-07-15 NOTE — Telephone Encounter (Signed)
Patient called stating that [facility]) told her that they need  a new prescription for her nitroGLYCERIN (NITROSTAT) 0.4 MG SL tablet. She is wanting someone to call her and confirm that they are going to fill the prescription.

## 2019-07-15 NOTE — Telephone Encounter (Signed)
[facility] contacted to see if faxed nitro that was sent yesterday was received. Per pharmacist, they did not receive it. Refill called in and patient notified.

## 2019-07-30 DIAGNOSIS — H353130 Nonexudative age-related macular degeneration, bilateral, stage unspecified: Secondary | ICD-10-CM | POA: Diagnosis not present

## 2019-08-04 DIAGNOSIS — E1122 Type 2 diabetes mellitus with diabetic chronic kidney disease: Secondary | ICD-10-CM | POA: Diagnosis not present

## 2019-08-04 DIAGNOSIS — I10 Essential (primary) hypertension: Secondary | ICD-10-CM | POA: Diagnosis not present

## 2019-08-04 DIAGNOSIS — E1165 Type 2 diabetes mellitus with hyperglycemia: Secondary | ICD-10-CM | POA: Diagnosis not present

## 2019-08-04 DIAGNOSIS — E1121 Type 2 diabetes mellitus with diabetic nephropathy: Secondary | ICD-10-CM | POA: Diagnosis not present

## 2019-08-04 DIAGNOSIS — E875 Hyperkalemia: Secondary | ICD-10-CM | POA: Diagnosis not present

## 2019-08-04 DIAGNOSIS — E7800 Pure hypercholesterolemia, unspecified: Secondary | ICD-10-CM | POA: Diagnosis not present

## 2019-08-06 DIAGNOSIS — N183 Chronic kidney disease, stage 3 (moderate): Secondary | ICD-10-CM | POA: Diagnosis not present

## 2019-08-06 DIAGNOSIS — Z6829 Body mass index (BMI) 29.0-29.9, adult: Secondary | ICD-10-CM | POA: Diagnosis not present

## 2019-08-06 DIAGNOSIS — E1121 Type 2 diabetes mellitus with diabetic nephropathy: Secondary | ICD-10-CM | POA: Diagnosis not present

## 2019-08-06 DIAGNOSIS — E1165 Type 2 diabetes mellitus with hyperglycemia: Secondary | ICD-10-CM | POA: Diagnosis not present

## 2019-08-06 DIAGNOSIS — I10 Essential (primary) hypertension: Secondary | ICD-10-CM | POA: Diagnosis not present

## 2019-08-06 DIAGNOSIS — Z23 Encounter for immunization: Secondary | ICD-10-CM | POA: Diagnosis not present

## 2019-08-06 DIAGNOSIS — R0989 Other specified symptoms and signs involving the circulatory and respiratory systems: Secondary | ICD-10-CM | POA: Diagnosis not present

## 2019-08-06 DIAGNOSIS — E1140 Type 2 diabetes mellitus with diabetic neuropathy, unspecified: Secondary | ICD-10-CM | POA: Diagnosis not present

## 2019-08-19 DIAGNOSIS — I10 Essential (primary) hypertension: Secondary | ICD-10-CM | POA: Diagnosis not present

## 2019-08-19 DIAGNOSIS — E785 Hyperlipidemia, unspecified: Secondary | ICD-10-CM | POA: Diagnosis not present

## 2019-09-18 DIAGNOSIS — E7800 Pure hypercholesterolemia, unspecified: Secondary | ICD-10-CM | POA: Diagnosis not present

## 2019-09-18 DIAGNOSIS — I10 Essential (primary) hypertension: Secondary | ICD-10-CM | POA: Diagnosis not present

## 2019-12-01 DIAGNOSIS — Z23 Encounter for immunization: Secondary | ICD-10-CM | POA: Diagnosis not present

## 2019-12-04 DIAGNOSIS — R809 Proteinuria, unspecified: Secondary | ICD-10-CM | POA: Diagnosis not present

## 2019-12-04 DIAGNOSIS — I10 Essential (primary) hypertension: Secondary | ICD-10-CM | POA: Diagnosis not present

## 2019-12-04 DIAGNOSIS — Z6830 Body mass index (BMI) 30.0-30.9, adult: Secondary | ICD-10-CM | POA: Diagnosis not present

## 2019-12-04 DIAGNOSIS — R0989 Other specified symptoms and signs involving the circulatory and respiratory systems: Secondary | ICD-10-CM | POA: Diagnosis not present

## 2019-12-04 DIAGNOSIS — Z0000 Encounter for general adult medical examination without abnormal findings: Secondary | ICD-10-CM | POA: Diagnosis not present

## 2019-12-04 DIAGNOSIS — E1121 Type 2 diabetes mellitus with diabetic nephropathy: Secondary | ICD-10-CM | POA: Diagnosis not present

## 2019-12-04 DIAGNOSIS — E1165 Type 2 diabetes mellitus with hyperglycemia: Secondary | ICD-10-CM | POA: Diagnosis not present

## 2019-12-18 DIAGNOSIS — E7801 Familial hypercholesterolemia: Secondary | ICD-10-CM | POA: Diagnosis not present

## 2019-12-18 DIAGNOSIS — I10 Essential (primary) hypertension: Secondary | ICD-10-CM | POA: Diagnosis not present

## 2020-01-01 DIAGNOSIS — Z23 Encounter for immunization: Secondary | ICD-10-CM | POA: Diagnosis not present

## 2020-02-17 DIAGNOSIS — I482 Chronic atrial fibrillation, unspecified: Secondary | ICD-10-CM | POA: Diagnosis not present

## 2020-02-17 DIAGNOSIS — I10 Essential (primary) hypertension: Secondary | ICD-10-CM | POA: Diagnosis not present

## 2020-03-04 DIAGNOSIS — R300 Dysuria: Secondary | ICD-10-CM | POA: Diagnosis not present

## 2020-03-04 DIAGNOSIS — R8290 Unspecified abnormal findings in urine: Secondary | ICD-10-CM | POA: Diagnosis not present

## 2020-03-10 DIAGNOSIS — R300 Dysuria: Secondary | ICD-10-CM | POA: Diagnosis not present

## 2020-03-10 DIAGNOSIS — B9689 Other specified bacterial agents as the cause of diseases classified elsewhere: Secondary | ICD-10-CM | POA: Diagnosis not present

## 2020-03-10 DIAGNOSIS — N3090 Cystitis, unspecified without hematuria: Secondary | ICD-10-CM | POA: Diagnosis not present

## 2020-03-11 DIAGNOSIS — B9689 Other specified bacterial agents as the cause of diseases classified elsewhere: Secondary | ICD-10-CM | POA: Diagnosis not present

## 2020-03-11 DIAGNOSIS — N3090 Cystitis, unspecified without hematuria: Secondary | ICD-10-CM | POA: Diagnosis not present

## 2020-03-18 DIAGNOSIS — N183 Chronic kidney disease, stage 3 unspecified: Secondary | ICD-10-CM | POA: Diagnosis not present

## 2020-03-18 DIAGNOSIS — E1122 Type 2 diabetes mellitus with diabetic chronic kidney disease: Secondary | ICD-10-CM | POA: Diagnosis not present

## 2020-03-18 DIAGNOSIS — E7801 Familial hypercholesterolemia: Secondary | ICD-10-CM | POA: Diagnosis not present

## 2020-03-22 DIAGNOSIS — K521 Toxic gastroenteritis and colitis: Secondary | ICD-10-CM | POA: Diagnosis not present

## 2020-03-22 DIAGNOSIS — T3695XA Adverse effect of unspecified systemic antibiotic, initial encounter: Secondary | ICD-10-CM | POA: Diagnosis not present

## 2020-03-22 DIAGNOSIS — Z9049 Acquired absence of other specified parts of digestive tract: Secondary | ICD-10-CM | POA: Diagnosis not present

## 2020-03-22 DIAGNOSIS — Z90710 Acquired absence of both cervix and uterus: Secondary | ICD-10-CM | POA: Diagnosis not present

## 2020-03-22 DIAGNOSIS — E860 Dehydration: Secondary | ICD-10-CM | POA: Diagnosis not present

## 2020-03-22 DIAGNOSIS — Z8673 Personal history of transient ischemic attack (TIA), and cerebral infarction without residual deficits: Secondary | ICD-10-CM | POA: Diagnosis not present

## 2020-03-22 DIAGNOSIS — Z7984 Long term (current) use of oral hypoglycemic drugs: Secondary | ICD-10-CM | POA: Diagnosis not present

## 2020-03-22 DIAGNOSIS — I4891 Unspecified atrial fibrillation: Secondary | ICD-10-CM | POA: Diagnosis not present

## 2020-03-22 DIAGNOSIS — E119 Type 2 diabetes mellitus without complications: Secondary | ICD-10-CM | POA: Diagnosis not present

## 2020-03-22 DIAGNOSIS — K219 Gastro-esophageal reflux disease without esophagitis: Secondary | ICD-10-CM | POA: Diagnosis not present

## 2020-03-22 DIAGNOSIS — Z20822 Contact with and (suspected) exposure to covid-19: Secondary | ICD-10-CM | POA: Diagnosis not present

## 2020-03-22 DIAGNOSIS — I10 Essential (primary) hypertension: Secondary | ICD-10-CM | POA: Diagnosis not present

## 2020-03-22 DIAGNOSIS — M1990 Unspecified osteoarthritis, unspecified site: Secondary | ICD-10-CM | POA: Diagnosis not present

## 2020-03-22 DIAGNOSIS — R0602 Shortness of breath: Secondary | ICD-10-CM | POA: Diagnosis not present

## 2020-03-22 DIAGNOSIS — I959 Hypotension, unspecified: Secondary | ICD-10-CM | POA: Diagnosis not present

## 2020-03-22 DIAGNOSIS — Z79899 Other long term (current) drug therapy: Secondary | ICD-10-CM | POA: Diagnosis not present

## 2020-03-22 DIAGNOSIS — R079 Chest pain, unspecified: Secondary | ICD-10-CM | POA: Diagnosis not present

## 2020-03-22 DIAGNOSIS — Z7982 Long term (current) use of aspirin: Secondary | ICD-10-CM | POA: Diagnosis not present

## 2020-03-22 DIAGNOSIS — Z96653 Presence of artificial knee joint, bilateral: Secondary | ICD-10-CM | POA: Diagnosis not present

## 2020-03-23 DIAGNOSIS — I480 Paroxysmal atrial fibrillation: Secondary | ICD-10-CM | POA: Diagnosis not present

## 2020-03-24 ENCOUNTER — Encounter: Payer: Self-pay | Admitting: Cardiology

## 2020-03-24 ENCOUNTER — Other Ambulatory Visit: Payer: Self-pay

## 2020-03-24 ENCOUNTER — Ambulatory Visit (INDEPENDENT_AMBULATORY_CARE_PROVIDER_SITE_OTHER): Payer: Medicare Other | Admitting: Cardiology

## 2020-03-24 VITALS — BP 132/80 | HR 98 | Temp 98.3°F | Ht 60.0 in | Wt 149.0 lb

## 2020-03-24 DIAGNOSIS — I4891 Unspecified atrial fibrillation: Secondary | ICD-10-CM

## 2020-03-24 DIAGNOSIS — I4819 Other persistent atrial fibrillation: Secondary | ICD-10-CM

## 2020-03-24 DIAGNOSIS — E782 Mixed hyperlipidemia: Secondary | ICD-10-CM | POA: Diagnosis not present

## 2020-03-24 DIAGNOSIS — I471 Supraventricular tachycardia, unspecified: Secondary | ICD-10-CM

## 2020-03-24 MED ORDER — ATENOLOL 50 MG PO TABS: 75.0000 mg | ORAL_TABLET | Freq: Every day | ORAL | 3 refills | Status: DC

## 2020-03-24 NOTE — Patient Instructions (Signed)
Medication Instructions:  STOP Aspirin   STOP HCTZ    INCREASE Atenolol to 75 mg ( 1 1/2 tablets) daily   I have given you a 30 FREE TRIAL for Eliquis.Call the number listed to activate the card before you use it. [phone]   Complete areas I highlighted on patient assistance form and give to the staff at the [facility] office    *If you need a refill on your cardiac medications before your next appointment, please call your pharmacy*   Lab Work: None today If you have labs (blood work) drawn today and your tests are completely normal, you will receive your results only by: Marland Kitchen MyChart Message (if you have MyChart) OR . A paper copy in the mail If you have any lab test that is abnormal or we need to change your treatment, we will call you to review the results.   Testing/Procedures: None today   Follow-Up: At [facility], you and your health needs are our priority.  As part of our continuing mission to provide you with exceptional heart care, we have created designated Provider Care Teams.  These Care Teams include your primary Cardiologist (physician) and Advanced Practice Providers (APPs -  Physician Assistants and Nurse Practitioners) who all work together to provide you with the care you need, when you need it.  We recommend signing up for the patient portal called "MyChart".  Sign up information is provided on this After Visit Summary.  MyChart is used to connect with patients for Virtual Visits (Telemedicine).  Patients are able to view lab/test results, encounter notes, upcoming appointments, etc.  Non-urgent messages can be sent to your provider as well.   To learn more about what you can do with MyChart, go to [email or web address].    Your next appointment:   1 month(s)  The format for your next appointment:   In Person  Provider:   Katina Dung, NP   Other Instructions None        Thank you for choosing [facility]  !

## 2020-03-24 NOTE — Progress Notes (Signed)
Cardiology Office Note  Date: 03/24/2020   ID: [name], DOB [birth year], MRN [id number]  PCP:  Manon Hilding, MD  Cardiologist:  Rozann Lesches, MD Electrophysiologist:  None   Chief Complaint  Patient presents with  . Hospitalization Follow-up    History of Present Illness: [name] is a 86 y.o. female last seen in August 2020.  She presents for an office visit after recent evaluation at [facility].  I reviewed available records.  She has been recently treated for a Klebsiella UTI, metronidazole and then amoxicillin.  She went to the ER complaining of weakness, exertional chest discomfort and palpitations lasting for about a week.  She was found to be in atrial fibrillation by ECG, initially heart rate around 120.  She was given fluids with relative hypotension and observed further.  It looks as if she was admitted for observation with Dr. Jonelle Sidle, there is no discharge summary for review.  She did undergo an echocardiogram which revealed LVEF 60 to 65% by report, moderately dilated left atrium.  I personally reviewed her ECG from May 4 and May 5, both of which show atrial fibrillation, adequate heart rate control noted on the fifth.  She presents today with her daughter.  States that she feels better, heart rate is in the 90s at rest, but she is still in atrial fibrillation. CHA2DS2-VASc score is 7.  She was started on Eliquis 5 mg twice daily at her hospital discharge, has continued on aspirin, also same dose of atenolol 50 mg daily.  Today we discussed the natural history of atrial fibrillation and management strategies.  Since she is tolerating the arrhythmia today, we will focus on heart rate control by increasing her atenolol to 75 mg daily, stop HCTZ, continue Eliquis for now, stop aspirin.  If blood pressure gets lower on higher dose atenolol, we might need to cut back on losartan as well.  I personally reviewed her ECG today which shows atrial fibrillation.  Past  Medical History:  Diagnosis Date  . Coronary atherosclerosis of native coronary artery    Nonobstructive  . Dyslipidemia   . PSVT (paroxysmal supraventricular tachycardia) ([place])   . TIA (transient ischemic attack)   . Type 2 diabetes mellitus ([place])    Diet controlled    Past Surgical History:  Procedure Laterality Date  . ABDOMINAL HYSTERECTOMY    . CARPAL TUNNEL RELEASE     x's 2  . CHOLECYSTECTOMY    . REPLACEMENT TOTAL KNEE  2002 & 2012  . SHOULDER SURGERY  2001    Current Outpatient Medications  Medication Sig Dispense Refill  . acetaminophen (TYLENOL) 500 MG tablet Take 1,000 mg by mouth 2 (two) times daily.    Marland Kitchen aspirin 81 MG tablet Take 162 mg by mouth at bedtime.    Marland Kitchen atenolol (TENORMIN) 50 MG tablet Take 1 tablet (50 mg total) by mouth daily. 90 tablet 3  . Calcium Carbonate-Vitamin D (CALTRATE 600+D) 600-400 MG-UNIT per tablet Take 1 tablet by mouth daily.     Marland Kitchen docusate sodium (COLACE) 100 MG capsule Take 100 mg by mouth daily as needed.     . hydrochlorothiazide (HYDRODIURIL) 12.5 MG tablet Take 12.5 mg by mouth daily.     Marland Kitchen losartan (COZAAR) 25 MG tablet Take 25 mg by mouth daily.    . meclizine (ANTIVERT) 25 MG tablet Take 1 tablet by mouth 3 (three) times daily as needed.    . metFORMIN (GLUCOPHAGE-XR) 500 MG 24 hr  tablet Take 2 tabs (1,000mg ) by mouth every morning & 1 tab (500mg ) every evening    . Multiple Vitamin (MULTIVITAMIN) tablet Take 1 tablet by mouth daily.      . multivitamin-lutein (OCUVITE-LUTEIN) CAPS capsule Take 1 capsule by mouth daily.    . nitroGLYCERIN (NITROSTAT) 0.4 MG SL tablet Place 1 tablet (0.4 mg total) under the tongue every 5 (five) minutes x 3 doses as needed for chest pain (if no relief after 3rd dose, proceed to the ED for an evaluation or call 911). 25 tablet 3  . simvastatin (ZOCOR) 10 MG tablet Take 10 mg by mouth at bedtime.      Marland Kitchen ELIQUIS 5 MG TABS tablet Take 5 mg by mouth 2 (two) times daily.     No current  facility-administered medications for this visit.   Allergies:  Toprol xl [metoprolol succinate]   ROS:  Hearing loss.  Physical Exam: VS:  BP 132/80   Pulse 98   Temp 98.3 F (36.8 C)   Ht 5' (1.524 m)   Wt 149 lb (67.6 kg)   SpO2 98%   BMI 29.10 kg/m , BMI Body mass index is 29.1 kg/m.  Wt Readings from Last 3 Encounters:  03/24/20 149 lb (67.6 kg)  07/14/19 150 lb (68 kg)  01/19/19 150 lb 12.8 oz (68.4 kg)    General: Elderly woman in no distress. HEENT: Conjunctiva and lids normal, wearing a mask. Neck: Supple, no elevated JVP or carotid bruits, no thyromegaly. Lungs: Clear to auscultation, nonlabored breathing at rest. Cardiac: Irregularly irregular, no S3 or significant systolic murmur, no pericardial rub. Abdomen: Soft, bowel sounds present. Extremities: Trace ankle edema, distal pulses 2+.  ECG:  An ECG dated 07/14/2019 was personally reviewed today and demonstrated:  Sinus rhythm with PVC.  Recent Labwork:  May 2021: AST 19, ALT 19, BUN 18, creatinine 0.76, hemoglobin 11.2, potassium 4.8, troponin T 0.01, SARS coronavirus 2 test negative  Other Studies Reviewed Today:  Echocardiogram 03/23/2020: Summary  1. The left ventricle is normal in size with normal wall thickness.  2. The left ventricular systolic function is normal, LVEF is visually  estimated at 60-65%.  3. The left atrium is moderately dilated in size.  4. The aortic valve is trileaflet with mildly thickened leaflets with normal  excursion.  5. The right ventricle is normal in size, with normal systolic function.  6. The right atrium is mildly dilated in size.   Assessment and Plan:  1.  Persistent atrial fibrillation, recently documented.  CHA2DS2-VASc score is 7.  As noted above we will focus on rate control strategy, she states that she feels better today.  Increase atenolol to 75 mg daily, stop aspirin and HCTZ.  Continue Eliquis 5 mg twice daily.  2.  History of PSVT.  3.   Mixed hyperlipidemia, she remains on Zocor with follow-up by Dr. Quintin Alto.  Medication Adjustments/Labs and Tests Ordered: Current medicines are reviewed at length with the patient today.  Concerns regarding medicines are outlined above.   Tests Ordered: Orders Placed This Encounter  Procedures  . EKG 12-Lead    Medication Changes: No orders of the defined types were placed in this encounter.   Disposition:  Follow up 4 weeks in the [place] office.  Signed, Satira Sark, MD, [facility] 03/24/2020 2:34 PM    [facility] at [facility] [address], [place], [place] [address] Phone: [phone]; Fax: [phone]

## 2020-03-29 DIAGNOSIS — E1165 Type 2 diabetes mellitus with hyperglycemia: Secondary | ICD-10-CM | POA: Diagnosis not present

## 2020-03-29 DIAGNOSIS — E7800 Pure hypercholesterolemia, unspecified: Secondary | ICD-10-CM | POA: Diagnosis not present

## 2020-03-29 DIAGNOSIS — E7801 Familial hypercholesterolemia: Secondary | ICD-10-CM | POA: Diagnosis not present

## 2020-03-29 DIAGNOSIS — E1122 Type 2 diabetes mellitus with diabetic chronic kidney disease: Secondary | ICD-10-CM | POA: Diagnosis not present

## 2020-03-29 DIAGNOSIS — E1121 Type 2 diabetes mellitus with diabetic nephropathy: Secondary | ICD-10-CM | POA: Diagnosis not present

## 2020-03-31 DIAGNOSIS — B9689 Other specified bacterial agents as the cause of diseases classified elsewhere: Secondary | ICD-10-CM | POA: Diagnosis not present

## 2020-03-31 DIAGNOSIS — B952 Enterococcus as the cause of diseases classified elsewhere: Secondary | ICD-10-CM | POA: Diagnosis not present

## 2020-03-31 DIAGNOSIS — N3090 Cystitis, unspecified without hematuria: Secondary | ICD-10-CM | POA: Diagnosis not present

## 2020-04-22 DIAGNOSIS — E1121 Type 2 diabetes mellitus with diabetic nephropathy: Secondary | ICD-10-CM | POA: Diagnosis not present

## 2020-04-22 DIAGNOSIS — E1165 Type 2 diabetes mellitus with hyperglycemia: Secondary | ICD-10-CM | POA: Diagnosis not present

## 2020-04-22 DIAGNOSIS — I4891 Unspecified atrial fibrillation: Secondary | ICD-10-CM | POA: Diagnosis not present

## 2020-04-22 DIAGNOSIS — I10 Essential (primary) hypertension: Secondary | ICD-10-CM | POA: Diagnosis not present

## 2020-04-24 NOTE — Progress Notes (Addendum)
Cardiology Office Note  Date: 04/25/2020   ID: [name], DOB Mar 24, 1927, MRN [id number]  PCP:  Manon Hilding, MD  Cardiologist:  Rozann Lesches, MD Electrophysiologist:  None   Chief Complaint: F/U atrial fibrillation.   History of Present Illness: [name] is a 86 y.o. female with a history of persistent atrial fibrillation, PSVT, mixed hyperlipidemia.  Last seen by Dr. Domenic Polite on 03/24/2020 after recent evaluation at [facility].  She had recently been treated for UTI.  She presented to the emergency room complaints of weakness, exertional chest discomfort and palpitations lasting for approximately 1 week.  She was found to be in atrial fibrillation with an initial heart rate around 120.  She was given IV fluids for relative hypotension.  She was admitted for observation.  She had an echocardiogram which revealed EF of 6665% with a moderately dilated left atrium.  She stated she felt better at follow-up and her heart rate was in the 90s at rest but she was continuing in atrial fibrillation with a chads 2 vas 4 of 7.  She was started on Eliquis twice daily at hospital discharge and had continued aspirin and atenolol 50 mg daily.  He increased her atenolol to 75 mg daily.  HCTZ was stopped as well as aspirin.  Presents today with complaints of feeling fatigued and weak.  Her EKG today shows atrial fibrillation with RVR at a rate of 105.  Her atenolol was increased to 75 mg at last visit.  She denies any other anginal or exertional symptoms, orthostatic symptoms, PND or orthopnea, CVA or TIA-like symptoms,.  States she does have some mild lower extremity edema at times.   Past Medical History:  Diagnosis Date  . Coronary atherosclerosis of native coronary artery    Nonobstructive  . Dyslipidemia   . PSVT (paroxysmal supraventricular tachycardia) ([place])   . TIA (transient ischemic attack)   . Type 2 diabetes mellitus ([place])    Diet controlled    Past Surgical History:    Procedure Laterality Date  . ABDOMINAL HYSTERECTOMY    . CARPAL TUNNEL RELEASE     x's 2  . CHOLECYSTECTOMY    . REPLACEMENT TOTAL KNEE  2002 & 2012  . SHOULDER SURGERY  2001    Current Outpatient Medications  Medication Sig Dispense Refill  . acetaminophen (TYLENOL) 500 MG tablet Take 1,000 mg by mouth 2 (two) times daily.    Marland Kitchen atenolol (TENORMIN) 50 MG tablet Take 2 tablets (100 mg total) by mouth daily. 180 tablet 3  . Calcium Carbonate-Vitamin D (CALTRATE 600+D) 600-400 MG-UNIT per tablet Take 1 tablet by mouth daily.     Marland Kitchen docusate sodium (COLACE) 100 MG capsule Take 100 mg by mouth daily as needed.     Marland Kitchen ELIQUIS 5 MG TABS tablet Take 5 mg by mouth 2 (two) times daily.    Marland Kitchen losartan (COZAAR) 25 MG tablet Take 25 mg by mouth daily.    . meclizine (ANTIVERT) 25 MG tablet Take 1 tablet by mouth 3 (three) times daily as needed.    . metFORMIN (GLUCOPHAGE-XR) 500 MG 24 hr tablet Take 2 tabs (1,000mg ) by mouth every morning & 1 tab (500mg ) every evening    . Multiple Vitamin (MULTIVITAMIN) tablet Take 1 tablet by mouth daily.      . multivitamin-lutein (OCUVITE-LUTEIN) CAPS capsule Take 1 capsule by mouth daily.    . nitroGLYCERIN (NITROSTAT) 0.4 MG SL tablet Place 1 tablet (0.4 mg total) under the  tongue every 5 (five) minutes x 3 doses as needed for chest pain (if no relief after 3rd dose, proceed to the ED for an evaluation or call 911). 25 tablet 3  . simvastatin (ZOCOR) 10 MG tablet Take 10 mg by mouth at bedtime.       No current facility-administered medications for this visit.   Allergies:  Toprol xl [metoprolol succinate]   Social History: The patient  reports that she has never smoked. She has never used smokeless tobacco. She reports that she does not drink alcohol or use drugs.   Family History: The patient's family history includes Coronary artery disease in her mother.   ROS:  Please see the history of present illness. Otherwise, complete review of systems is positive  for none.  All other systems are reviewed and negative.   Physical Exam: VS:  BP 122/78   Pulse (!) 106   Ht 5' (1.524 m)   Wt 143 lb 6.4 oz (65 kg)   SpO2 99%   BMI 28.01 kg/m , BMI Body mass index is 28.01 kg/m.  Wt Readings from Last 3 Encounters:  04/25/20 143 lb 6.4 oz (65 kg)  03/24/20 149 lb (67.6 kg)  07/14/19 150 lb (68 kg)    General: Patient appears comfortable at rest. Neck: Supple, no elevated JVP or carotid bruits, no thyromegaly. Lungs: Clear to auscultation, nonlabored breathing at rest. Cardiac: Irregularly irregular tachycardic rate and rhythm, no S3 or significant systolic murmur, no pericardial rub. Extremities: No pitting edema, distal pulses 2+. Skin: Warm and dry. Musculoskeletal: No kyphosis. Neuropsychiatric: Alert and oriented x3, affect grossly appropriate.  ECG:  An ECG dated 04/25/2020 was personally reviewed today and demonstrated:  Atrial fibrillation with rapid ventricular response rate of 105, ST abnormality, possible digitalis effect.  Recent Labwork: No results found for requested labs within last 8760 hours.  No results found for: CHOL, TRIG, HDL, CHOLHDL, VLDL, LDLCALC, LDLDIRECT  Other Studies Reviewed Today:  Echocardiogram 03/23/2020: Summary  1. The left ventricle is normal in size with normal wall thickness.  2. The left ventricular systolic function is normal, LVEF is visually  estimated at 60-65%.  3. The left atrium is moderately dilated in size.  4. The aortic valve is trileaflet with mildly thickened leaflets with normal  excursion.  5. The right ventricle is normal in size, with normal systolic function.  6. The right atrium is mildly dilated in size  Assessment and Plan:  1. Persistent atrial fibrillation ([place])   2. Mixed hyperlipidemia   3. Essential hypertension    1. Persistent atrial fibrillation (HCC) EKG today shows atrial fibrillation with a RVR rate of 105.  Increase atenolol to 100 mg daily.  Come  back in 1 week for nursing visit and EKG.  May need to add in a calcium channel blocker if rate not controlled at next visit.  2. Mixed hyperlipidemia Continue Zocor 10 mg p.o. daily. Most recent lab work from PCP office collected on 03/29/2020 showed a total cholesterol 129, triglycerides 105, HDL 56, LDL 54  3.  Hypertension Blood pressure well controlled on current therapy.  Continue losartan 25 mg daily   Medication Adjustments/Labs and Tests Ordered: Current medicines are reviewed at length with the patient today.  Concerns regarding medicines are outlined above.   Disposition: Follow-up with Dr. Domenic Polite or APP 1 month  Signed, Levell July, NP 04/25/2020 11:48 AM    [facility] [place], [place], [place] [address]  Phone: [phone]; Fax: [phone]

## 2020-04-25 ENCOUNTER — Other Ambulatory Visit: Payer: Self-pay

## 2020-04-25 ENCOUNTER — Ambulatory Visit (INDEPENDENT_AMBULATORY_CARE_PROVIDER_SITE_OTHER): Payer: Medicare Other | Admitting: Family Medicine

## 2020-04-25 ENCOUNTER — Encounter: Payer: Self-pay | Admitting: *Deleted

## 2020-04-25 ENCOUNTER — Encounter: Payer: Self-pay | Admitting: Family Medicine

## 2020-04-25 VITALS — BP 122/78 | HR 106 | Ht 60.0 in | Wt 143.4 lb

## 2020-04-25 DIAGNOSIS — I10 Essential (primary) hypertension: Secondary | ICD-10-CM

## 2020-04-25 DIAGNOSIS — I4819 Other persistent atrial fibrillation: Secondary | ICD-10-CM

## 2020-04-25 DIAGNOSIS — E782 Mixed hyperlipidemia: Secondary | ICD-10-CM | POA: Diagnosis not present

## 2020-04-25 MED ORDER — ATENOLOL 50 MG PO TABS: 100.0000 mg | ORAL_TABLET | Freq: Every day | ORAL | 3 refills | Status: DC

## 2020-04-25 NOTE — Patient Instructions (Addendum)
Medication Instructions:    Your physician has recommended you make the following change in your medication:   Increase atenolol to 100 mg daily. Please take (2) of your 50 mg tablets daily.  Continue other medications the same  Labwork:  NONE  Testing/Procedures:  NONE  Follow-Up:  Your physician recommends that you schedule a follow-up appointment in: 1 month (office)  Your physician recommends that you schedule a follow-up appointment in: 1 week with the nurse for an EKG.  Any Other Special Instructions Will Be Listed Below (If Applicable).  If you need a refill on your cardiac medications before your next appointment, please call your pharmacy.

## 2020-05-02 ENCOUNTER — Ambulatory Visit (INDEPENDENT_AMBULATORY_CARE_PROVIDER_SITE_OTHER): Payer: Medicare Other | Admitting: *Deleted

## 2020-05-02 ENCOUNTER — Telehealth: Payer: Self-pay

## 2020-05-02 DIAGNOSIS — I4819 Other persistent atrial fibrillation: Secondary | ICD-10-CM

## 2020-05-02 NOTE — Telephone Encounter (Signed)
LMTCB, I have Eliquis samples to give patient.

## 2020-05-02 NOTE — Telephone Encounter (Signed)
Pt calling for update as to when papers for eliquis will be completed by Physician.   Please call [phone]  Thanks renee

## 2020-05-02 NOTE — Progress Notes (Signed)
Patient in office for EKG - states she is feeling better - daughter stated that she seems like she may have more energy.  EKG scanned into epic for review.

## 2020-05-02 NOTE — Telephone Encounter (Signed)
Assistance form faxed today, pt will pick up samples

## 2020-05-18 NOTE — Progress Notes (Signed)
Atrial fibrillation rate is currently controlled on current atenolol dosage. Continue current dosage of atenolol

## 2020-05-24 NOTE — Progress Notes (Signed)
Patient notified and verbalized understanding. 

## 2020-05-27 ENCOUNTER — Ambulatory Visit (INDEPENDENT_AMBULATORY_CARE_PROVIDER_SITE_OTHER): Payer: Medicare Other | Admitting: Cardiology

## 2020-05-27 ENCOUNTER — Encounter: Payer: Self-pay | Admitting: Cardiology

## 2020-05-27 ENCOUNTER — Other Ambulatory Visit: Payer: Self-pay

## 2020-05-27 VITALS — BP 136/97 | HR 81 | Ht 60.0 in | Wt 141.0 lb

## 2020-05-27 DIAGNOSIS — E782 Mixed hyperlipidemia: Secondary | ICD-10-CM | POA: Diagnosis not present

## 2020-05-27 DIAGNOSIS — I10 Essential (primary) hypertension: Secondary | ICD-10-CM

## 2020-05-27 DIAGNOSIS — I4819 Other persistent atrial fibrillation: Secondary | ICD-10-CM | POA: Diagnosis not present

## 2020-05-27 NOTE — Progress Notes (Signed)
Cardiology Office Note  Date: 05/27/2020   ID: [name], DOB 06-Jan-1927, MRN [id number]  PCP:  Manon Hilding, MD  Cardiologist:  Rozann Lesches, MD Electrophysiologist:  None   Chief Complaint  Patient presents with  . Cardiac follow-up    History of Present Illness: [name] is a 86 y.o. female last seen in June by Mr. Leonides Sake NP.  She is here today with her daughter for a follow-up visit. At the last visit atenolol was increased to 100 mg daily for better heart rate control of atrial fibrillation.  She states that she feels better, less fatigue, no sense of palpitations or chest discomfort.  CHA2DS2-VASc score is 7.  She is tolerating Eliquis, no obvious bleeding problems.  We discussed getting follow-up lab work for her next visit.  Past Medical History:  Diagnosis Date  . Atrial fibrillation ([place])   . Coronary atherosclerosis of native coronary artery    Nonobstructive  . Dyslipidemia   . PSVT (paroxysmal supraventricular tachycardia) ([place])   . TIA (transient ischemic attack)   . Type 2 diabetes mellitus ([place])    Diet controlled    Past Surgical History:  Procedure Laterality Date  . ABDOMINAL HYSTERECTOMY    . CARPAL TUNNEL RELEASE     x's 2  . CHOLECYSTECTOMY    . REPLACEMENT TOTAL KNEE  2002 & 2012  . SHOULDER SURGERY  2001    Current Outpatient Medications  Medication Sig Dispense Refill  . acetaminophen (TYLENOL) 500 MG tablet Take 1,000 mg by mouth 2 (two) times daily.    Marland Kitchen atenolol (TENORMIN) 50 MG tablet Take 2 tablets (100 mg total) by mouth daily. 180 tablet 3  . Calcium Carbonate-Vitamin D (CALTRATE 600+D) 600-400 MG-UNIT per tablet Take 1 tablet by mouth daily.     Marland Kitchen docusate sodium (COLACE) 100 MG capsule Take 100 mg by mouth daily as needed.     Marland Kitchen ELIQUIS 5 MG TABS tablet Take 5 mg by mouth 2 (two) times daily.    Marland Kitchen losartan (COZAAR) 25 MG tablet Take 25 mg by mouth daily.    . meclizine (ANTIVERT) 25 MG tablet Take 1 tablet by mouth 3  (three) times daily as needed.    . metFORMIN (GLUCOPHAGE-XR) 500 MG 24 hr tablet Take 2 tabs (1,000mg ) by mouth every morning & 1 tab (500mg ) every evening    . Multiple Vitamin (MULTIVITAMIN) tablet Take 1 tablet by mouth daily.      . multivitamin-lutein (OCUVITE-LUTEIN) CAPS capsule Take 1 capsule by mouth daily.    . nitroGLYCERIN (NITROSTAT) 0.4 MG SL tablet Place 1 tablet (0.4 mg total) under the tongue every 5 (five) minutes x 3 doses as needed for chest pain (if no relief after 3rd dose, proceed to the ED for an evaluation or call 911). 25 tablet 3  . simvastatin (ZOCOR) 10 MG tablet Take 10 mg by mouth at bedtime.       No current facility-administered medications for this visit.   Allergies:  Toprol xl [metoprolol succinate]   ROS:   Uses a cane, no recent falls.  Chronic back pain.  Physical Exam: VS:  BP (!) 136/97 (BP Location: Left Arm, Patient Position: Sitting, Cuff Size: Normal)   Pulse 81   Ht 5' (1.524 m)   Wt 141 lb (64 kg)   SpO2 98%   BMI 27.54 kg/m , BMI Body mass index is 27.54 kg/m.  Wt Readings from Last 3 Encounters:  05/27/20 141  lb (64 kg)  04/25/20 143 lb 6.4 oz (65 kg)  03/24/20 149 lb (67.6 kg)    General: Elderly woman, appears comfortable at rest. HEENT: Conjunctiva and lids normal, wearing a mask. Neck: Supple, no elevated JVP or carotid bruits, no thyromegaly. Lungs: Clear to auscultation, nonlabored breathing at rest. Cardiac: Irregularly irregular, no S3 or significant systolic murmur, no pericardial rub. Extremities: Trace ankle edema, distal pulses 2+.  ECG:  An ECG dated 06/01/2020 was personally reviewed today and demonstrated:  Rate controlled atrial fibrillation.  Recent Labwork:  May 2021: AST 19, ALT 19, BUN 18, creatinine 0.76, hemoglobin 11.2, potassium 4.8, troponin T 0.01, SARS coronavirus 2 test negative  Other Studies Reviewed Today:  Echocardiogram 03/23/2020: Summary  1. The left ventricle is normal in size with normal  wall thickness.  2. The left ventricular systolic function is normal, LVEF is visually  estimated at 60-65%.  3. The left atrium is moderately dilated in size.  4. The aortic valve is trileaflet with mildly thickened leaflets with normal  excursion.  5. The right ventricle is normal in size, with normal systolic function.  6. The right atrium is mildly dilated in size.   Assessment and Plan:  1.  Permanent atrial fibrillation with CHA2DS2-VASc score of 7.  Plan to continue heart rate control strategy, she feels better following increase in atenolol to 100 mg daily, heart rate well controlled today.  She remains on Eliquis for stroke prophylaxis.  Follow-up CBC and BMET for next visit.  2.  Mixed hyperlipidemia, she remains on Zocor with follow-up by PCP.  Medication Adjustments/Labs and Tests Ordered: Current medicines are reviewed at length with the patient today.  Concerns regarding medicines are outlined above.   Tests Ordered: Orders Placed This Encounter  Procedures  . CBC w/Diff  . Basic Metabolic Panel (BMET)    Medication Changes: No orders of the defined types were placed in this encounter.   Disposition:  Follow up 2 months in the [place] office.  Signed, Satira Sark, MD, [facility] 05/27/2020 1:39 PM    [place] at [facility] [address] [place], [place] [address] Phone: [phone]; Fax: [phone]

## 2020-05-27 NOTE — Patient Instructions (Addendum)
Medication Instructions:  *If you need a refill on your cardiac medications before your next appointment, please call your pharmacy*  Lab Work: Your physician recommends that you return for lab work in about 2 MONTHS - [place] and BMET If you have labs (blood work) drawn today and your tests are completely normal, you will receive your results only by: Marland Kitchen MyChart Message (if you have MyChart) OR . A paper copy in the mail If you have any lab test that is abnormal or we need to change your treatment, we will call you to review the results.  Follow-Up: At [facility], you and your health needs are our priority.  As part of our continuing mission to provide you with exceptional heart care, we have created designated Provider Care Teams.  These Care Teams include your primary Cardiologist (physician) and Advanced Practice Providers (APPs -  Physician Assistants and Nurse Practitioners) who all work together to provide you with the care you need, when you need it.  We recommend signing up for the patient portal called "MyChart".  Sign up information is provided on this After Visit Summary.  MyChart is used to connect with patients for Virtual Visits (Telemedicine).  Patients are able to view lab/test results, encounter notes, upcoming appointments, etc.  Non-urgent messages can be sent to your provider as well.   To learn more about what you can do with MyChart, go to [email or web address].    Your next appointment:   Your physician recommends that you schedule a follow-up appointment in: 2 MONTHS with Dr. Domenic [name]. The format for your next appointment:  Either In Person or Virtual  Other Instructions -- About 7-10 days prior to your follow up appointment please stop by the Lab located in the hospital and have some blood work completed.--

## 2020-06-17 DIAGNOSIS — I5032 Chronic diastolic (congestive) heart failure: Secondary | ICD-10-CM | POA: Diagnosis not present

## 2020-06-17 DIAGNOSIS — N184 Chronic kidney disease, stage 4 (severe): Secondary | ICD-10-CM | POA: Diagnosis not present

## 2020-06-17 DIAGNOSIS — I130 Hypertensive heart and chronic kidney disease with heart failure and stage 1 through stage 4 chronic kidney disease, or unspecified chronic kidney disease: Secondary | ICD-10-CM | POA: Diagnosis not present

## 2020-06-17 DIAGNOSIS — I129 Hypertensive chronic kidney disease with stage 1 through stage 4 chronic kidney disease, or unspecified chronic kidney disease: Secondary | ICD-10-CM | POA: Diagnosis not present

## 2020-06-17 DIAGNOSIS — E875 Hyperkalemia: Secondary | ICD-10-CM | POA: Diagnosis not present

## 2020-06-17 DIAGNOSIS — E1122 Type 2 diabetes mellitus with diabetic chronic kidney disease: Secondary | ICD-10-CM | POA: Diagnosis not present

## 2020-06-17 DIAGNOSIS — N183 Chronic kidney disease, stage 3 unspecified: Secondary | ICD-10-CM | POA: Diagnosis not present

## 2020-06-17 DIAGNOSIS — E7849 Other hyperlipidemia: Secondary | ICD-10-CM | POA: Diagnosis not present

## 2020-07-19 DIAGNOSIS — I129 Hypertensive chronic kidney disease with stage 1 through stage 4 chronic kidney disease, or unspecified chronic kidney disease: Secondary | ICD-10-CM | POA: Diagnosis not present

## 2020-07-19 DIAGNOSIS — E1122 Type 2 diabetes mellitus with diabetic chronic kidney disease: Secondary | ICD-10-CM | POA: Diagnosis not present

## 2020-07-19 DIAGNOSIS — N183 Chronic kidney disease, stage 3 unspecified: Secondary | ICD-10-CM | POA: Diagnosis not present

## 2020-07-19 DIAGNOSIS — E875 Hyperkalemia: Secondary | ICD-10-CM | POA: Diagnosis not present

## 2020-07-22 DIAGNOSIS — E7800 Pure hypercholesterolemia, unspecified: Secondary | ICD-10-CM | POA: Diagnosis not present

## 2020-07-22 DIAGNOSIS — E1140 Type 2 diabetes mellitus with diabetic neuropathy, unspecified: Secondary | ICD-10-CM | POA: Diagnosis not present

## 2020-07-22 DIAGNOSIS — E1122 Type 2 diabetes mellitus with diabetic chronic kidney disease: Secondary | ICD-10-CM | POA: Diagnosis not present

## 2020-07-22 DIAGNOSIS — E1121 Type 2 diabetes mellitus with diabetic nephropathy: Secondary | ICD-10-CM | POA: Diagnosis not present

## 2020-07-22 DIAGNOSIS — I10 Essential (primary) hypertension: Secondary | ICD-10-CM | POA: Diagnosis not present

## 2020-07-22 DIAGNOSIS — E7801 Familial hypercholesterolemia: Secondary | ICD-10-CM | POA: Diagnosis not present

## 2020-07-22 DIAGNOSIS — N183 Chronic kidney disease, stage 3 unspecified: Secondary | ICD-10-CM | POA: Diagnosis not present

## 2020-07-22 DIAGNOSIS — E1165 Type 2 diabetes mellitus with hyperglycemia: Secondary | ICD-10-CM | POA: Diagnosis not present

## 2020-07-27 ENCOUNTER — Other Ambulatory Visit (HOSPITAL_COMMUNITY)
Admission: RE | Admit: 2020-07-27 | Discharge: 2020-07-27 | Disposition: A | Discharge status: Home / Self Care | Payer: Medicare Other | Source: Ambulatory Visit | Attending: Cardiology | Admitting: Cardiology

## 2020-07-27 ENCOUNTER — Other Ambulatory Visit: Payer: Self-pay

## 2020-07-27 DIAGNOSIS — I4819 Other persistent atrial fibrillation: Secondary | ICD-10-CM | POA: Insufficient documentation

## 2020-07-27 DIAGNOSIS — I10 Essential (primary) hypertension: Secondary | ICD-10-CM

## 2020-07-27 LAB — BASIC METABOLIC PANEL
Anion gap: 11 (ref 5–15)
BUN: 18 mg/dL (ref 8–23)
CO2: 26 mmol/L (ref 22–32)
Calcium: 10 mg/dL (ref 8.9–10.3)
Chloride: 102 mmol/L (ref 98–111)
Creatinine, Ser: 0.68 mg/dL (ref 0.44–1.00)
GFR calc Af Amer: >60 mL/min (ref >60)
GFR calc non Af Amer: >60 mL/min (ref >60)
Glucose, Bld: 168 mg/dL — ABNORMAL HIGH (ref 70–99)
Potassium: 5.2 mmol/L — ABNORMAL HIGH (ref 3.5–5.1)
Sodium: 139 mmol/L (ref 135–145)

## 2020-07-27 LAB — CBC WITH DIFFERENTIAL/PLATELET
Abs Immature Granulocytes: 0.03 10*3/uL (ref 0.00–0.07)
Basophils Absolute: 0 10*3/uL (ref 0.0–0.1)
Basophils Relative: 1 %
Eosinophils Absolute: 0.2 10*3/uL (ref 0.0–0.5)
Eosinophils Relative: 2 %
HCT: 38.3 % (ref 36.0–46.0)
Hemoglobin: 12.2 g/dL (ref 12.0–15.0)
Immature Granulocytes: 0 %
Lymphocytes Relative: 25 %
Lymphs Abs: 2.2 10*3/uL (ref 0.7–4.0)
MCH: 30 pg (ref 26.0–34.0)
MCHC: 31.9 g/dL (ref 30.0–36.0)
MCV: 94.3 fL (ref 80.0–100.0)
Monocytes Absolute: 0.7 10*3/uL (ref 0.1–1.0)
Monocytes Relative: 8 %
Neutro Abs: 5.6 10*3/uL (ref 1.7–7.7)
Neutrophils Relative %: 64 %
Platelets: 313 10*3/uL (ref 150–400)
RBC: 4.06 MIL/uL (ref 3.87–5.11)
RDW: 14.1 % (ref 11.5–15.5)
WBC: 8.7 10*3/uL (ref 4.0–10.5)
nRBC: 0 % (ref 0.0–0.2)

## 2020-07-29 DIAGNOSIS — Z6828 Body mass index (BMI) 28.0-28.9, adult: Secondary | ICD-10-CM | POA: Diagnosis not present

## 2020-07-29 DIAGNOSIS — E1121 Type 2 diabetes mellitus with diabetic nephropathy: Secondary | ICD-10-CM | POA: Diagnosis not present

## 2020-07-29 DIAGNOSIS — I10 Essential (primary) hypertension: Secondary | ICD-10-CM | POA: Diagnosis not present

## 2020-07-29 DIAGNOSIS — E1165 Type 2 diabetes mellitus with hyperglycemia: Secondary | ICD-10-CM | POA: Diagnosis not present

## 2020-07-29 DIAGNOSIS — I482 Chronic atrial fibrillation, unspecified: Secondary | ICD-10-CM | POA: Diagnosis not present

## 2020-07-29 DIAGNOSIS — E1140 Type 2 diabetes mellitus with diabetic neuropathy, unspecified: Secondary | ICD-10-CM | POA: Diagnosis not present

## 2020-08-03 ENCOUNTER — Other Ambulatory Visit: Payer: Self-pay

## 2020-08-03 ENCOUNTER — Ambulatory Visit (INDEPENDENT_AMBULATORY_CARE_PROVIDER_SITE_OTHER): Payer: Medicare Other | Admitting: Student

## 2020-08-03 ENCOUNTER — Encounter: Payer: Self-pay | Admitting: Student

## 2020-08-03 VITALS — BP 124/70 | HR 90 | Ht 60.0 in | Wt 139.6 lb

## 2020-08-03 DIAGNOSIS — I4819 Other persistent atrial fibrillation: Secondary | ICD-10-CM

## 2020-08-03 DIAGNOSIS — E782 Mixed hyperlipidemia: Secondary | ICD-10-CM | POA: Diagnosis not present

## 2020-08-03 DIAGNOSIS — E875 Hyperkalemia: Secondary | ICD-10-CM | POA: Diagnosis not present

## 2020-08-03 DIAGNOSIS — Z8673 Personal history of transient ischemic attack (TIA), and cerebral infarction without residual deficits: Secondary | ICD-10-CM

## 2020-08-03 DIAGNOSIS — Z23 Encounter for immunization: Secondary | ICD-10-CM | POA: Diagnosis not present

## 2020-08-03 MED ORDER — ATENOLOL 50 MG PO TABS
50.0000 mg | ORAL_TABLET | Freq: Two times a day (BID) | ORAL | 3 refills | Status: DC
Start: 2020-08-03 — End: 2021-08-07

## 2020-08-03 NOTE — Progress Notes (Signed)
Cardiology Office Note    Date:  08/03/2020   ID:  [name], DOB [birth year], MRN [id number]  PCP:  Manon Hilding, MD  Cardiologist: Rozann Lesches, MD    Chief Complaint  Patient presents with  . Follow-up    2 month visit    History of Present Illness:    [name] is a 86 y.o. female with past medical history of persistent atrial fibrillation (on Eliquis), pSVT, HLD and prior TIA who presents to the office today for 66-monthfollow-up.   She was last examined by [name] 05/2020 and denied any recent chest pain or palpitations at the time of her visit. Atenolol had recently been increased to 100 mg daily and she was continued on this along with Eliquis for anticoagulation. She did have follow-up labs earlier this month and renal function was stable with creatinine at 0.68. K+ was mildly elevated at 5.2. Hemoglobin and platelets were within a normal range. No changes were made to her medication regimen.  In talking with the patient and her daughter today, she reports overall doing well from a cardiac perspective since her last visit. She does not check her heart rate regularly at home but denies any palpitations during the day. She does report occasional episodes of her heart skipping at night which are typically most notable upon lying down to go to sleep. Symptoms last for a few minutes and resolve. She denies any recent chest pain or dyspnea on exertion. No recent orthopnea or PND.  She does experience intermittent lower extremity edema and utilizes compression stockings. Her activity level is limited secondary to chronic back pain.   Past Medical History:  Diagnosis Date  . Atrial fibrillation ([place]   . Coronary atherosclerosis of native coronary artery    Nonobstructive  . Dyslipidemia   . PSVT (paroxysmal supraventricular tachycardia) ([place]   . TIA (transient ischemic attack)   . Type 2 diabetes mellitus ([place]    Diet controlled    Past Surgical History:    Procedure Laterality Date  . ABDOMINAL HYSTERECTOMY    . CARPAL TUNNEL RELEASE     x's 2  . CHOLECYSTECTOMY    . REPLACEMENT TOTAL KNEE  2002 & 2012  . SHOULDER SURGERY  2001    Current Medications: Outpatient Medications Prior to Visit  Medication Sig Dispense Refill  . acetaminophen (TYLENOL) 500 MG tablet Take 1,000 mg by mouth 2 (two) times daily.    . Calcium Carbonate-Vitamin D (CALTRATE 600+D) 600-400 MG-UNIT per tablet Take 1 tablet by mouth daily.     .Marland Kitchendocusate sodium (COLACE) 100 MG capsule Take 100 mg by mouth daily as needed.     .Marland KitchenELIQUIS 5 MG TABS tablet Take 5 mg by mouth 2 (two) times daily.    .Marland Kitchenlosartan (COZAAR) 25 MG tablet Take 25 mg by mouth daily.    . meclizine (ANTIVERT) 25 MG tablet Take 1 tablet by mouth 3 (three) times daily as needed.    . metFORMIN (GLUCOPHAGE-XR) 500 MG 24 hr tablet Take 2 tabs (1,0066m by mouth every morning & 1 tab (50049mevery evening    . Multiple Vitamin (MULTIVITAMIN) tablet Take 1 tablet by mouth daily.      . multivitamin-lutein (OCUVITE-LUTEIN) CAPS capsule Take 1 capsule by mouth daily.    . nitroGLYCERIN (NITROSTAT) 0.4 MG SL tablet Place 1 tablet (0.4 mg total) under the tongue every 5 (five) minutes x 3 doses as needed for chest pain (  if no relief after 3rd dose, proceed to the ED for an evaluation or call 911). 25 tablet 3  . simvastatin (ZOCOR) 10 MG tablet Take 10 mg by mouth at bedtime.      Marland Kitchen atenolol (TENORMIN) 50 MG tablet Take 2 tablets (100 mg total) by mouth daily. 180 tablet 3   No facility-administered medications prior to visit.     Allergies:   Toprol xl [metoprolol succinate]   Social History   Socioeconomic History  . Marital status: Widowed    Spouse name: Not on file  . Number of children: Not on file  . Years of education: Not on file  . Highest education level: Not on file  Occupational History  . Not on file  Tobacco Use  . Smoking status: Never Smoker  . Smokeless tobacco: Never Used   Substance and Sexual Activity  . Alcohol use: No    Alcohol/week: 0.0 standard drinks  . Drug use: No  . Sexual activity: Not on file  Other Topics Concern  . Not on file  Social History Narrative  . Not on file   Social Determinants of Health   Financial Resource Strain:   . Difficulty of Paying Living Expenses: Not on file  Food Insecurity:   . Worried About Charity fundraiser in the Last Year: Not on file  . Ran Out of Food in the Last Year: Not on file  Transportation Needs:   . Lack of Transportation (Medical): Not on file  . Lack of Transportation (Non-Medical): Not on file  Physical Activity:   . Days of Exercise per Week: Not on file  . Minutes of Exercise per Session: Not on file  Stress:   . Feeling of Stress : Not on file  Social Connections:   . Frequency of Communication with Friends and Family: Not on file  . Frequency of Social Gatherings with Friends and Family: Not on file  . Attends Religious Services: Not on file  . Active Member of Clubs or Organizations: Not on file  . Attends Archivist Meetings: Not on file  . Marital Status: Not on file     Family History:  The patient's family history includes Coronary artery disease in her mother.   Review of Systems:   Please see the history of present illness.     General:  No chills, fever, night sweats or weight changes. Positive for back pain.  Cardiovascular:  No chest pain, dyspnea on exertion, edema, orthopnea, paroxysmal nocturnal dyspnea. Positive for palpitations.  Dermatological: No rash, lesions/masses Respiratory: No cough, dyspnea Urologic: No hematuria, dysuria Abdominal:   No nausea, vomiting, diarrhea, bright red blood per rectum, melena, or hematemesis Neurologic:  No visual changes, wkns, changes in mental status. All other systems reviewed and are otherwise negative except as noted above.   Physical Exam:    VS:  BP 124/70   Pulse 90   Ht 5' (1.524 m)   Wt 139 lb 9.6 oz  (63.3 kg)   SpO2 97%   BMI 27.26 kg/m    General: Elderly female appearing in no acute distress. Head: Normocephalic, atraumatic. Neck: No carotid bruits. JVD not elevated.  Lungs: Respirations regular and unlabored, without wheezes or rales.  Heart: Irregularly irregular. No S3 or S4.  No murmur, no rubs, or gallops appreciated. Abdomen: Appears non-distended. No obvious abdominal masses. Msk:  Strength and tone appear normal for age. No obvious joint deformities or effusions. Extremities: No clubbing or cyanosis. Trace  ankle edema bilaterally.  Distal pedal pulses are 2+ bilaterally. Neuro: Alert and oriented X 3. Moves all extremities spontaneously. No focal deficits noted. Psych:  Responds to questions appropriately with a normal affect. Skin: No rashes or lesions noted  Wt Readings from Last 3 Encounters:  08/03/20 139 lb 9.6 oz (63.3 kg)  05/27/20 141 lb (64 kg)  04/25/20 143 lb 6.4 oz (65 kg)     Studies/Labs Reviewed:   EKG:  EKG is not ordered today. EKG from 05/02/2020 is reviewed and shows rate-controlled atrial fibrillation, HR 84.  Recent Labs: 07/27/2020: BUN 18; Creatinine, Ser 0.68; Hemoglobin 12.2; Platelets 313; Potassium 5.2; Sodium 139   Lipid Panel No results found for: CHOL, TRIG, HDL, CHOLHDL, VLDL, LDLCALC, LDLDIRECT  Additional studies/ records that were reviewed today include:   Echocardiogram: 03/2020 Summary  1. The left ventricle is normal in size with normal wall thickness.  2. The left ventricular systolic function is normal, LVEF is visually  estimated at 60-65%.  3. The left atrium is moderately dilated in size.  4. The aortic valve is trileaflet with mildly thickened leaflets with normal  excursion.  5. The right ventricle is normal in size, with normal systolic function.  6. The right atrium is mildly dilated in size.      Assessment:    1. Persistent atrial fibrillation ([place])   2. Mixed hyperlipidemia   3. History of TIA  (transient ischemic attack)   4. Hyperkalemia   5. Need for immunization against influenza      Plan:   In order of problems listed above:  1. Persistent Atrial Fibrillation - She does report occasional palpitations which are typically most notable at night. Heart rate is well controlled in the 80's to 90's during today's visit. She is currently on Atenolol 137m daily for rate-control. I recommended dividing this out to 5550mBID to see if this will help with her nighttime symptoms.  - She denies any evidence of active bleeding. Remains on Eliquis 50m11mID for anticoagulation. Only indication for reduced dosing at this time is age.   2. HLD - Followed by her PCP. She remains on Simvastatin 73m67mily.   3. Prior TIA - Remains on statin therapy. No longer on ASA given the need for anticoagulation.   4. Hyperkalemia  - K+ was elevated to 5.2 by recent labs. Not on K+ supplementation. She has been consuming tomatoes daily which are high in K+. Will recheck a BMET in 4 weeks for reassessment.  5. Need for Influenza Vaccine - Flu shot was administered by nursing staff during today's visit.    Medication Adjustments/Labs and Tests Ordered: Current medicines are reviewed at length with the patient today.  Concerns regarding medicines are outlined above.  Medication changes, Labs and Tests ordered today are listed in the Patient Instructions below. Patient Instructions  Medication Instructions:  Your physician has recommended you make the following change in your medication:   Take Atenolol 50 mg Two Times Daily   *If you need a refill on your cardiac medications before your next appointment, please call your pharmacy*   Lab Work: Your physician recommends that you return for lab work in: 4-6 Weeks ( 09-07-20)   If you have labs (blood work) drawn today and your tests are completely normal, you will receive your results only by: . MyMarland Kitchenhart Message (if you have MyChart) OR . A paper  copy in the mail If you have any lab test that is abnormal or  we need to change your treatment, we will call you to review the results.   Testing/Procedures: NONE    Follow-Up: At [facility], you and your health needs are our priority.  As part of our continuing mission to provide you with exceptional heart care, we have created designated Provider Care Teams.  These Care Teams include your primary Cardiologist (physician) and Advanced Practice Providers (APPs -  Physician Assistants and Nurse Practitioners) who all work together to provide you with the care you need, when you need it.  We recommend signing up for the patient portal called "MyChart".  Sign up information is provided on this After Visit Summary.  MyChart is used to connect with patients for Virtual Visits (Telemedicine).  Patients are able to view lab/test results, encounter notes, upcoming appointments, etc.  Non-urgent messages can be sent to your provider as well.   To learn more about what you can do with MyChart, go to [email or web address].    Your next appointment:   6 month(s)  The format for your next appointment:   In Person  Provider:   Rozann Lesches, MD or Bernerd Pho, PA-C   Other Instructions Thank you for choosing [facility]!    Potassium Content of Foods  Potassium is a mineral found in many foods and drinks. It affects how the heart works, and helps keep fluids and minerals balanced in the body. The amount of potassium you need each day depends on your age and any medical conditions you may have. Talk to your health care provider or dietitian about how much potassium you need. The following lists of foods provide the general serving size for foods and the approximate amount of potassium in each serving, listed in milligrams (mg). Actual values may vary depending on the product and how it is processed. High in potassium The following foods and beverages have 200 mg or more of  potassium per serving:  Apricots (raw) - 2 have 200 mg of potassium.  Apricots (dry) - 5 have 200 mg of potassium.  Artichoke - 1 medium has 345 mg of potassium.  Avocado -  fruit has 245 mg of potassium.  Banana - 1 medium fruit has 425 mg of potassium.  [place] or baked beans (canned) -  cup has 280 mg of potassium.  White beans (canned) -  cup has 595 mg potassium.  Beef roast - 3 oz has 320 mg of potassium.  Ground beef - 3 oz has 270 mg of potassium.  Beets (raw or cooked) -  cup has 260 mg of potassium.  Bran muffin - 2 oz has 300 mg of potassium.  Broccoli (cooked) -  cup has 230 mg of potassium.  Brussels sprouts -  cup has 250 mg of potassium.  Cantaloupe -  cup has 215 mg of potassium.  Cereal, 100% bran -  cup has 200-400 mg of potassium.  Cheeseburger -1 single fast food burger has 225-400 mg of potassium.  Chicken - 3 oz has 220 mg of potassium.  Clams (canned) - 3 oz has 535 mg of potassium.  Crab - 3 oz has 225 mg of potassium.  Dates - 5 have 270 mg of potassium.  Dried beans and peas -  cup has 300-475 mg of potassium.  Figs (dried) - 2 have 260 mg of potassium.  Fish (halibut, tuna, cod, snapper) - 3 oz has 480 mg of potassium.  Fish (salmon, haddock, swordfish, perch) - 3 oz has 300 mg of potassium.  Fish (tuna, canned) - 3 oz has 200 mg of potassium.  [place] fries (fast food) - 3 oz has 470 mg of potassium.  [place]anola with fruit and nuts -  cup has 200 mg of potassium.  Grapefruit juice -  cup has 200 mg of potassium.  Honeydew melon -  cup has 200 mg of potassium.  Kale (raw) - 1 cup has 300 mg of potassium.  Kiwi - 1 medium fruit has 240 mg of potassium.  Kohlrabi, rutabaga, parsnips -  cup has 280 mg of potassium.  Lentils -  cup has 365 mg of potassium.  Mango - 1 each has 325 mg of potassium.  Milk (nonfat, low-fat, whole, buttermilk) - 1 cup has 350-380 mg of potassium.  Milk (chocolate) - 1 cup has 420 mg of  potassium  Molasses - 1 Tbsp has 295 mg of potassium.  Mushrooms -  cup has 280 mg of potassium.  Nectarine - 1 each has 275 mg of potassium.  Nuts (almonds, peanuts, hazelnuts, [place], cashew, mixed) - 1 oz has 200 mg of potassium.  Nuts (pistachios) - 1 oz has 295 mg of potassium.  Orange - 1 fruit has 240 mg of potassium.  Orange juice -  cup has 235 mg of potassium.  Papaya -  medium fruit has 390 mg of potassium.  Peanut butter (chunky) - 2 Tbsp has 240 mg of potassium.  Peanut butter (smooth) - 2 Tbsp has 210 mg of potassium.  Pear - 1 medium (200 mg) of potassium.  Pomegranate - 1 whole fruit has 400 mg of potassium.  Pomegranate juice -  cup has 215 mg of potassium.  Pork - 3 oz has 350 mg of potassium.  Potato chips (salted) - 1 oz has 465 mg of potassium.  Potato (baked with skin) - 1 medium has 925 mg of potassium.  Potato (boiled) -  cup has 255 mg of potassium.  Potato (Mashed) -  cup has 330 mg of potassium.  Prune juice -  cup has 370 mg of potassium.  Prunes - 5 have 305 mg of potassium.  Pudding (chocolate) -  cup has 230 mg of potassium.  Pumpkin (canned) -  cup has 250 mg of potassium.  Raisins (seedless) -  cup has 270 mg of potassium.  Seeds (sunflower or pumpkin) - 1 oz has 240 mg of potassium.  Soy milk - 1 cup has 300 mg of potassium.  Spinach (cooked) - 1/2 cup has 420 mg of potassium.  Spinach (canned) -  cup has 370 mg of potassium.  Sweet potato (baked with skin) - 1 medium has 450 mg of potassium.  Swiss chard -  cup has 480 mg of potassium.  Tomato or vegetable juice -  cup has 275 mg of potassium.  Tomato (sauce or puree) -  cup has 400-550 mg of potassium.  Tomato (raw) - 1 medium has 290 mg of potassium.  Tomato (canned) -  cup has 200-300 mg of potassium.  [place] - 3 oz has 250 mg of potassium.  Wheat germ - 1 oz has 250 mg of potassium.  Winter squash -  cup has 250 mg of potassium.  Yogurt  (plain or fruited) - 6 oz has 260-435 mg of potassium.  Zucchini -  cup has 220 mg of potassium. Moderate in potassium The following foods and beverages have 50-200 mg of potassium per serving:  Apple - 1 fruit has 150 mg of potassium  Apple juice -  cup  has 150 mg of potassium  Applesauce -  cup has 90 mg of potassium  Apricot nectar -  cup has 140 mg of potassium  Asparagus (small spears) -  cup has 155 mg of potassium  Asparagus (large spears) - 6 have 155 mg of potassium  Bagel (cinnamon raisin) - 1 four-inch bagel has 130 mg of potassium  Bagel (egg or plain) - 1 four- inch bagel has 70 mg of potassium  Beans (green) -  cup has 90 mg of potassium  Beans (yellow) -  cup has 190 mg of potassium  Beer, regular - 12 oz has 100 mg of potassium  Beets (canned) -  cup has 125 mg of potassium  Blackberries -  cup has 115 mg of potassium  Blueberries -  cup has 60 mg of potassium  Bread (whole wheat) - 1 slice has 70 mg of potassium  Broccoli (raw) -  cup has 145 mg of potassium  Cabbage -  cup has 150 mg of potassium  Carrots (cooked or raw) -  cup has 180 mg of potassium  Cauliflower (raw) -  cup has 150 mg of potassium  Celery (raw) -  cup has 155 mg of potassium  Cereal, bran flakes -  cup has 120-150 mg of potassium  Cheese (cottage) -  cup has 110 mg of potassium  Cherries - 10 have 150 mg of potassium  Chocolate - 1 oz bar has 165 mg of potassium  Coffee (brewed) - 6 oz has 90 mg of potassium  Corn -  cup or 1 ear has 195 mg of potassium  Cucumbers -  cup has 80 mg of potassium  Egg - 1 large egg has 60 mg of potassium  Eggplant -  cup has 60 mg of potassium  Endive (raw) -  cup has 80 mg of potassium  English muffin - 1 has 65 mg of potassium  Fish (ocean perch) - 3 oz has 192 mg of potassium  Frankfurter, beef or pork - 1 has 75 mg of potassium  Fruit cocktail -  cup has 115 mg of potassium  Grape juice -  cup has  170 mg of potassium  Grapefruit -  fruit has 175 mg of potassium  Grapes -  cup has 155 mg of potassium  Greens: kale, turnip, collard -  cup has 110-150 mg of potassium  Ice cream or frozen yogurt (chocolate) -  cup has 175 mg of potassium  Ice cream or frozen yogurt (vanilla) -  cup has 120-150 mg of potassium  Lemons, limes - 1 each has 80 mg of potassium  Lettuce - 1 cup has 100 mg of potassium  Mixed vegetables -  cup has 150 mg of potassium  Mushrooms, raw -  cup has 110 mg of potassium  Nuts (walnuts, pecans, or macadamia) - 1 oz has 125 mg of potassium  Oatmeal -  cup has 80 mg of potassium  Okra -  cup has 110 mg of potassium  Onions -  cup has 120 mg of potassium  Peach - 1 has 185 mg of potassium  Peaches (canned) -  cup has 120 mg of potassium  Pears (canned) -  cup has 120 mg of potassium  Peas, green (frozen) -  cup has 90 mg of potassium  Peppers (Green) -  cup has 130 mg of potassium  Peppers (Red) -  cup has 160 mg of potassium  Pineapple juice -  cup has 165 mg of  potassium  Pineapple (fresh or canned) -  cup has 100 mg of potassium  Plums - 1 has 105 mg of potassium  Pudding, vanilla -  cup has 150 mg of potassium  Raspberries -  cup has 90 mg of potassium  Rhubarb -  cup has 115 mg of potassium  [place], wild -  cup has 80 mg of potassium  Shrimp - 3 oz has 155 mg of potassium  Spinach (raw) - 1 cup has 170 mg of potassium  Strawberries -  cup has 125 mg of potassium  Summer squash -  cup has 175-200 mg of potassium  Swiss chard (raw) - 1 cup has 135 mg of potassium  Tangerines - 1 fruit has 140 mg of potassium  Tea, brewed - 6 oz has 65 mg of potassium  Turnips -  cup has 140 mg of potassium  Watermelon -  cup has 85 mg of potassium  Wine (Red, table) - 5 oz has 180 mg of potassium  Wine (White, table) - 5 oz 100 mg of potassium Low in potassium The following foods and beverages have less than 50 mg  of potassium per serving.  Bread (white) - 1 slice has 30 mg of potassium  Carbonated beverages - 12 oz has less than 5 mg of potassium  Cheese - 1 oz has 20-30 mg of potassium  Cranberries -  cup has 45 mg of potassium  Cranberry juice cocktail -  cup has 20 mg of potassium  Fats and oils - 1 Tbsp has less than 5 mg of potassium  Hummus - 1 Tbsp has 32 mg of potassium  Nectar (papaya, mango, or pear) -  cup has 35 mg of potassium  [place] (white or brown) -  cup has 50 mg of potassium  Spaghetti or macaroni (cooked) -  cup has 30 mg of potassium  [place], flour or corn - 1 has 50 mg of potassium  Waffle - 1 four-inch waffle has 50 mg of potassium  Water chestnuts -  cup has 40 mg of potassium Summary  Potassium is a mineral found in many foods and drinks. It affects how the heart works, and helps keep fluids and minerals balanced in the body.  The amount of potassium you need each day depends on your age and any existing medical conditions you may have. Your health care provider or dietitian may recommend an amount of potassium that you should have each day. This information is not intended to replace advice given to you by your health care provider. Make sure you discuss any questions you have with your health care provider. Document Revised: 10/18/2017 Document Reviewed: 01/30/2017 Elsevier Patient Education  [place], Erma Heritage, [place]  08/03/2020 4:47 PM    [place] [address] [place], [place] [address] Phone: [phone] Fax: [phone]

## 2020-08-03 NOTE — Patient Instructions (Addendum)
Medication Instructions:  Your physician has recommended you make the following change in your medication:   Take Atenolol 50 mg Two Times Daily   *If you need a refill on your cardiac medications before your next appointment, please call your pharmacy*   Lab Work: Your physician recommends that you return for lab work in: 4-6 Weeks ( 09-07-20)   If you have labs (blood work) drawn today and your tests are completely normal, you will receive your results only by: Marland Kitchen MyChart Message (if you have MyChart) OR . A paper copy in the mail If you have any lab test that is abnormal or we need to change your treatment, we will call you to review the results.   Testing/Procedures: NONE    Follow-Up: At [facility], you and your health needs are our priority.  As part of our continuing mission to provide you with exceptional heart care, we have created designated Provider Care Teams.  These Care Teams include your primary Cardiologist (physician) and Advanced Practice Providers (APPs -  Physician Assistants and Nurse Practitioners) who all work together to provide you with the care you need, when you need it.  We recommend signing up for the patient portal called "MyChart".  Sign up information is provided on this After Visit Summary.  MyChart is used to connect with patients for Virtual Visits (Telemedicine).  Patients are able to view lab/test results, encounter notes, upcoming appointments, etc.  Non-urgent messages can be sent to your provider as well.   To learn more about what you can do with MyChart, go to [email or web address].    Your next appointment:   6 month(s)  The format for your next appointment:   In Person  Provider:   Rozann Lesches, MD or Bernerd Pho, PA-C   Other Instructions Thank you for choosing [facility]!    Potassium Content of Foods  Potassium is a mineral found in many foods and drinks. It affects how the heart works, and helps keep  fluids and minerals balanced in the body. The amount of potassium you need each day depends on your age and any medical conditions you may have. Talk to your health care provider or dietitian about how much potassium you need. The following lists of foods provide the general serving size for foods and the approximate amount of potassium in each serving, listed in milligrams (mg). Actual values may vary depending on the product and how it is processed. High in potassium The following foods and beverages have 200 mg or more of potassium per serving:  Apricots (raw) - 2 have 200 mg of potassium.  Apricots (dry) - 5 have 200 mg of potassium.  Artichoke - 1 medium has 345 mg of potassium.  Avocado -  fruit has 245 mg of potassium.  Banana - 1 medium fruit has 425 mg of potassium.  [place] or baked beans (canned) -  cup has 280 mg of potassium.  White beans (canned) -  cup has 595 mg potassium.  Beef roast - 3 oz has 320 mg of potassium.  Ground beef - 3 oz has 270 mg of potassium.  Beets (raw or cooked) -  cup has 260 mg of potassium.  Bran muffin - 2 oz has 300 mg of potassium.  Broccoli (cooked) -  cup has 230 mg of potassium.  Brussels sprouts -  cup has 250 mg of potassium.  Cantaloupe -  cup has 215 mg of potassium.  Cereal, 100% bran -  cup  has 200-400 mg of potassium.  Cheeseburger -1 single fast food burger has 225-400 mg of potassium.  Chicken - 3 oz has 220 mg of potassium.  Clams (canned) - 3 oz has 535 mg of potassium.  Crab - 3 oz has 225 mg of potassium.  Dates - 5 have 270 mg of potassium.  Dried beans and peas -  cup has 300-475 mg of potassium.  Figs (dried) - 2 have 260 mg of potassium.  Fish (halibut, tuna, cod, snapper) - 3 oz has 480 mg of potassium.  Fish (salmon, haddock, swordfish, perch) - 3 oz has 300 mg of potassium.  Fish (tuna, canned) - 3 oz has 200 mg of potassium.  [place] fries (fast food) - 3 oz has 470 mg of  potassium.  Granola with fruit and nuts -  cup has 200 mg of potassium.  Grapefruit juice -  cup has 200 mg of potassium.  Honeydew melon -  cup has 200 mg of potassium.  Kale (raw) - 1 cup has 300 mg of potassium.  Kiwi - 1 medium fruit has 240 mg of potassium.  Kohlrabi, rutabaga, parsnips -  cup has 280 mg of potassium.  Lentils -  cup has 365 mg of potassium.  Mango - 1 each has 325 mg of potassium.  Milk (nonfat, low-fat, whole, buttermilk) - 1 cup has 350-380 mg of potassium.  Milk (chocolate) - 1 cup has 420 mg of potassium  Molasses - 1 Tbsp has 295 mg of potassium.  Mushrooms -  cup has 280 mg of potassium.  Nectarine - 1 each has 275 mg of potassium.  Nuts (almonds, peanuts, hazelnuts, [place], cashew, mixed) - 1 oz has 200 mg of potassium.  Nuts (pistachios) - 1 oz has 295 mg of potassium.  Orange - 1 fruit has 240 mg of potassium.  Orange juice -  cup has 235 mg of potassium.  Papaya -  medium fruit has 390 mg of potassium.  Peanut butter (chunky) - 2 Tbsp has 240 mg of potassium.  Peanut butter (smooth) - 2 Tbsp has 210 mg of potassium.  Pear - 1 medium (200 mg) of potassium.  Pomegranate - 1 whole fruit has 400 mg of potassium.  Pomegranate juice -  cup has 215 mg of potassium.  Pork - 3 oz has 350 mg of potassium.  Potato chips (salted) - 1 oz has 465 mg of potassium.  Potato (baked with skin) - 1 medium has 925 mg of potassium.  Potato (boiled) -  cup has 255 mg of potassium.  Potato (Mashed) -  cup has 330 mg of potassium.  Prune juice -  cup has 370 mg of potassium.  Prunes - 5 have 305 mg of potassium.  Pudding (chocolate) -  cup has 230 mg of potassium.  Pumpkin (canned) -  cup has 250 mg of potassium.  Raisins (seedless) -  cup has 270 mg of potassium.  Seeds (sunflower or pumpkin) - 1 oz has 240 mg of potassium.  Soy milk - 1 cup has 300 mg of potassium.  Spinach (cooked) - 1/2 cup has 420 mg of  potassium.  Spinach (canned) -  cup has 370 mg of potassium.  Sweet potato (baked with skin) - 1 medium has 450 mg of potassium.  Swiss chard -  cup has 480 mg of potassium.  Tomato or vegetable juice -  cup has 275 mg of potassium.  Tomato (sauce or puree) -  cup has 400-550 mg of  potassium.  Tomato (raw) - 1 medium has 290 mg of potassium.  Tomato (canned) -  cup has 200-300 mg of potassium.  [place] - 3 oz has 250 mg of potassium.  Wheat germ - 1 oz has 250 mg of potassium.  Winter squash -  cup has 250 mg of potassium.  Yogurt (plain or fruited) - 6 oz has 260-435 mg of potassium.  Zucchini -  cup has 220 mg of potassium. Moderate in potassium The following foods and beverages have 50-200 mg of potassium per serving:  Apple - 1 fruit has 150 mg of potassium  Apple juice -  cup has 150 mg of potassium  Applesauce -  cup has 90 mg of potassium  Apricot nectar -  cup has 140 mg of potassium  Asparagus (small spears) -  cup has 155 mg of potassium  Asparagus (large spears) - 6 have 155 mg of potassium  Bagel (cinnamon raisin) - 1 four-inch bagel has 130 mg of potassium  Bagel (egg or plain) - 1 four- inch bagel has 70 mg of potassium  Beans (green) -  cup has 90 mg of potassium  Beans (yellow) -  cup has 190 mg of potassium  Beer, regular - 12 oz has 100 mg of potassium  Beets (canned) -  cup has 125 mg of potassium  Blackberries -  cup has 115 mg of potassium  Blueberries -  cup has 60 mg of potassium  Bread (whole wheat) - 1 slice has 70 mg of potassium  Broccoli (raw) -  cup has 145 mg of potassium  Cabbage -  cup has 150 mg of potassium  Carrots (cooked or raw) -  cup has 180 mg of potassium  Cauliflower (raw) -  cup has 150 mg of potassium  Celery (raw) -  cup has 155 mg of potassium  Cereal, bran flakes -  cup has 120-150 mg of potassium  Cheese (cottage) -  cup has 110 mg of potassium  Cherries - 10 have 150 mg of  potassium  Chocolate - 1 oz bar has 165 mg of potassium  Coffee (brewed) - 6 oz has 90 mg of potassium  Corn -  cup or 1 ear has 195 mg of potassium  Cucumbers -  cup has 80 mg of potassium  Egg - 1 large egg has 60 mg of potassium  Eggplant -  cup has 60 mg of potassium  Endive (raw) -  cup has 80 mg of potassium  English muffin - 1 has 65 mg of potassium  Fish (ocean perch) - 3 oz has 192 mg of potassium  Frankfurter, beef or pork - 1 has 75 mg of potassium  Fruit cocktail -  cup has 115 mg of potassium  Grape juice -  cup has 170 mg of potassium  Grapefruit -  fruit has 175 mg of potassium  Grapes -  cup has 155 mg of potassium  Greens: kale, turnip, collard -  cup has 110-150 mg of potassium  Ice cream or frozen yogurt (chocolate) -  cup has 175 mg of potassium  Ice cream or frozen yogurt (vanilla) -  cup has 120-150 mg of potassium  Lemons, limes - 1 each has 80 mg of potassium  Lettuce - 1 cup has 100 mg of potassium  Mixed vegetables -  cup has 150 mg of potassium  Mushrooms, raw -  cup has 110 mg of potassium  Nuts (walnuts, pecans, or macadamia) - 1 oz has  125 mg of potassium  Oatmeal -  cup has 80 mg of potassium  Okra -  cup has 110 mg of potassium  Onions -  cup has 120 mg of potassium  Peach - 1 has 185 mg of potassium  Peaches (canned) -  cup has 120 mg of potassium  Pears (canned) -  cup has 120 mg of potassium  Peas, green (frozen) -  cup has 90 mg of potassium  Peppers (Green) -  cup has 130 mg of potassium  Peppers (Red) -  cup has 160 mg of potassium  Pineapple juice -  cup has 165 mg of potassium  Pineapple (fresh or canned) -  cup has 100 mg of potassium  Plums - 1 has 105 mg of potassium  Pudding, vanilla -  cup has 150 mg of potassium  Raspberries -  cup has 90 mg of potassium  Rhubarb -  cup has 115 mg of potassium  [place], wild -  cup has 80 mg of potassium  Shrimp - 3 oz has 155 mg of  potassium  Spinach (raw) - 1 cup has 170 mg of potassium  Strawberries -  cup has 125 mg of potassium  Summer squash -  cup has 175-200 mg of potassium  Swiss chard (raw) - 1 cup has 135 mg of potassium  Tangerines - 1 fruit has 140 mg of potassium  Tea, brewed - 6 oz has 65 mg of potassium  Turnips -  cup has 140 mg of potassium  Watermelon -  cup has 85 mg of potassium  Wine (Red, table) - 5 oz has 180 mg of potassium  Wine (White, table) - 5 oz 100 mg of potassium Low in potassium The following foods and beverages have less than 50 mg of potassium per serving.  Bread (white) - 1 slice has 30 mg of potassium  Carbonated beverages - 12 oz has less than 5 mg of potassium  Cheese - 1 oz has 20-30 mg of potassium  Cranberries -  cup has 45 mg of potassium  Cranberry juice cocktail -  cup has 20 mg of potassium  Fats and oils - 1 Tbsp has less than 5 mg of potassium  Hummus - 1 Tbsp has 32 mg of potassium  Nectar (papaya, mango, or pear) -  cup has 35 mg of potassium  Schlachter (white or brown) -  cup has 50 mg of potassium  Spaghetti or macaroni (cooked) -  cup has 30 mg of potassium  Tortilla, flour or corn - 1 has 50 mg of potassium  Waffle - 1 four-inch waffle has 50 mg of potassium  Water chestnuts -  cup has 40 mg of potassium Summary  Potassium is a mineral found in many foods and drinks. It affects how the heart works, and helps keep fluids and minerals balanced in the body.  The amount of potassium you need each day depends on your age and any existing medical conditions you may have. Your health care provider or dietitian may recommend an amount of potassium that you should have each day. This information is not intended to replace advice given to you by your health care provider. Make sure you discuss any questions you have with your health care provider. Document Revised: 10/18/2017 Document Reviewed: 01/30/2017 Elsevier Patient Education  [place]. [place]

## 2020-08-18 DIAGNOSIS — I129 Hypertensive chronic kidney disease with stage 1 through stage 4 chronic kidney disease, or unspecified chronic kidney disease: Secondary | ICD-10-CM | POA: Diagnosis not present

## 2020-08-18 DIAGNOSIS — N183 Chronic kidney disease, stage 3 unspecified: Secondary | ICD-10-CM | POA: Diagnosis not present

## 2020-08-18 DIAGNOSIS — E1122 Type 2 diabetes mellitus with diabetic chronic kidney disease: Secondary | ICD-10-CM | POA: Diagnosis not present

## 2020-09-08 DIAGNOSIS — E11319 Type 2 diabetes mellitus with unspecified diabetic retinopathy without macular edema: Secondary | ICD-10-CM | POA: Diagnosis not present

## 2020-09-14 ENCOUNTER — Telehealth: Payer: Self-pay | Admitting: *Deleted

## 2020-09-14 ENCOUNTER — Other Ambulatory Visit: Payer: Self-pay

## 2020-09-14 ENCOUNTER — Other Ambulatory Visit (HOSPITAL_COMMUNITY)
Admission: RE | Admit: 2020-09-14 | Discharge: 2020-09-14 | Disposition: A | Discharge status: Home / Self Care | Payer: Medicare Other | Source: Ambulatory Visit | Attending: Student | Admitting: Student

## 2020-09-14 DIAGNOSIS — E876 Hypokalemia: Secondary | ICD-10-CM | POA: Diagnosis not present

## 2020-09-14 DIAGNOSIS — Z79899 Other long term (current) drug therapy: Secondary | ICD-10-CM

## 2020-09-14 LAB — BASIC METABOLIC PANEL
Anion gap: 8 (ref 5–15)
BUN: 17 mg/dL (ref 8–23)
CO2: 26 mmol/L (ref 22–32)
Calcium: 9.6 mg/dL (ref 8.9–10.3)
Chloride: 102 mmol/L (ref 98–111)
Creatinine, Ser: 0.69 mg/dL (ref 0.44–1.00)
GFR, Estimated: >60 mL/min (ref >60)
Glucose, Bld: 153 mg/dL — ABNORMAL HIGH (ref 70–99)
Potassium: 5.4 mmol/L — ABNORMAL HIGH (ref 3.5–5.1)
Sodium: 136 mmol/L (ref 135–145)

## 2020-09-14 MED ORDER — LOSARTAN POTASSIUM 25 MG PO TABS: 12.5000 mg | ORAL_TABLET | Freq: Every day | ORAL | 3 refills | Status: DC

## 2020-09-14 NOTE — Telephone Encounter (Signed)
Pt notified orders placed  

## 2020-09-14 NOTE — Telephone Encounter (Signed)
-----   Message from Erma Heritage, [place] sent at 09/14/2020 10:55 AM EDT ----- Please let the patient know her kidney function remains stable but K+ has continued to increase. Would make sure she is continuing to limit her intake of K+ rich foods. Not on potassium supplementation by review of medications. She is on Losartan and this can sometimes cause high-potassium. Would recommend reducing Losartan to 12.5mg  daily and rechecking BMET again in 2-3 weeks.

## 2020-09-17 DIAGNOSIS — I129 Hypertensive chronic kidney disease with stage 1 through stage 4 chronic kidney disease, or unspecified chronic kidney disease: Secondary | ICD-10-CM | POA: Diagnosis not present

## 2020-09-17 DIAGNOSIS — E1122 Type 2 diabetes mellitus with diabetic chronic kidney disease: Secondary | ICD-10-CM | POA: Diagnosis not present

## 2020-09-17 DIAGNOSIS — N183 Chronic kidney disease, stage 3 unspecified: Secondary | ICD-10-CM | POA: Diagnosis not present

## 2020-09-17 DIAGNOSIS — E875 Hyperkalemia: Secondary | ICD-10-CM | POA: Diagnosis not present

## 2020-09-22 DIAGNOSIS — Z23 Encounter for immunization: Secondary | ICD-10-CM | POA: Diagnosis not present

## 2020-10-03 ENCOUNTER — Other Ambulatory Visit (HOSPITAL_COMMUNITY)
Admission: RE | Admit: 2020-10-03 | Discharge: 2020-10-03 | Disposition: A | Discharge status: Home / Self Care | Payer: Medicare Other | Source: Ambulatory Visit | Attending: Student | Admitting: Student

## 2020-10-03 ENCOUNTER — Other Ambulatory Visit: Payer: Self-pay

## 2020-10-03 DIAGNOSIS — Z79899 Other long term (current) drug therapy: Secondary | ICD-10-CM | POA: Diagnosis not present

## 2020-10-03 LAB — BASIC METABOLIC PANEL
Anion gap: 9 (ref 5–15)
BUN: 24 mg/dL — ABNORMAL HIGH (ref 8–23)
CO2: 25 mmol/L (ref 22–32)
Calcium: 9.6 mg/dL (ref 8.9–10.3)
Chloride: 105 mmol/L (ref 98–111)
Creatinine, Ser: 0.75 mg/dL (ref 0.44–1.00)
GFR, Estimated: >60 mL/min (ref >60)
Glucose, Bld: 160 mg/dL — ABNORMAL HIGH (ref 70–99)
Potassium: 5 mmol/L (ref 3.5–5.1)
Sodium: 139 mmol/L (ref 135–145)

## 2020-10-18 DIAGNOSIS — I129 Hypertensive chronic kidney disease with stage 1 through stage 4 chronic kidney disease, or unspecified chronic kidney disease: Secondary | ICD-10-CM | POA: Diagnosis not present

## 2020-10-18 DIAGNOSIS — E1122 Type 2 diabetes mellitus with diabetic chronic kidney disease: Secondary | ICD-10-CM | POA: Diagnosis not present

## 2020-10-18 DIAGNOSIS — N183 Chronic kidney disease, stage 3 unspecified: Secondary | ICD-10-CM | POA: Diagnosis not present

## 2020-11-24 DIAGNOSIS — I10 Essential (primary) hypertension: Secondary | ICD-10-CM | POA: Diagnosis not present

## 2020-11-24 DIAGNOSIS — E1165 Type 2 diabetes mellitus with hyperglycemia: Secondary | ICD-10-CM | POA: Diagnosis not present

## 2020-11-24 DIAGNOSIS — N183 Chronic kidney disease, stage 3 unspecified: Secondary | ICD-10-CM | POA: Diagnosis not present

## 2020-11-24 DIAGNOSIS — E1122 Type 2 diabetes mellitus with diabetic chronic kidney disease: Secondary | ICD-10-CM | POA: Diagnosis not present

## 2020-11-24 DIAGNOSIS — E7800 Pure hypercholesterolemia, unspecified: Secondary | ICD-10-CM | POA: Diagnosis not present

## 2020-11-24 DIAGNOSIS — E7801 Familial hypercholesterolemia: Secondary | ICD-10-CM | POA: Diagnosis not present

## 2020-11-24 DIAGNOSIS — E1121 Type 2 diabetes mellitus with diabetic nephropathy: Secondary | ICD-10-CM | POA: Diagnosis not present

## 2020-11-28 DIAGNOSIS — Z0000 Encounter for general adult medical examination without abnormal findings: Secondary | ICD-10-CM | POA: Diagnosis not present

## 2020-11-28 DIAGNOSIS — E1121 Type 2 diabetes mellitus with diabetic nephropathy: Secondary | ICD-10-CM | POA: Diagnosis not present

## 2020-11-28 DIAGNOSIS — E1140 Type 2 diabetes mellitus with diabetic neuropathy, unspecified: Secondary | ICD-10-CM | POA: Diagnosis not present

## 2020-11-28 DIAGNOSIS — E1165 Type 2 diabetes mellitus with hyperglycemia: Secondary | ICD-10-CM | POA: Diagnosis not present

## 2020-11-28 DIAGNOSIS — I482 Chronic atrial fibrillation, unspecified: Secondary | ICD-10-CM | POA: Diagnosis not present

## 2020-11-28 DIAGNOSIS — E7800 Pure hypercholesterolemia, unspecified: Secondary | ICD-10-CM | POA: Diagnosis not present

## 2020-11-28 DIAGNOSIS — I10 Essential (primary) hypertension: Secondary | ICD-10-CM | POA: Diagnosis not present

## 2020-12-26 DIAGNOSIS — E119 Type 2 diabetes mellitus without complications: Secondary | ICD-10-CM | POA: Diagnosis not present

## 2021-01-03 NOTE — Progress Notes (Signed)
Cardiology Office Note  Date: 01/04/2021   ID: [name], DOB [birth year], MRN [id number]  PCP:  Manon Hilding, MD  Cardiologist:  Rozann Lesches, MD Electrophysiologist:  None   Chief Complaint  Patient presents with  . Cardiac follow-up    History of Present Illness: [name] is a 87 y.o. female last seen in September 2021 by Ms. Strader PA-C.  She is here today with her daughter for a follow-up visit. She reports an intermittent sense of palpitations at nighttime when she first lays down, otherwise no significant change in symptomatology.  I reviewed her medications which are stable from a cardiac perspective and outlined below.  She continues on atenolol for heart rate control.  Reports no spontaneous bleeding problems on Eliquis.  She did have lab work with Dr. Quintin Alto in January, we are requesting the results for review.  Past Medical History:  Diagnosis Date  . Atrial fibrillation ([place])   . Coronary atherosclerosis of native coronary artery    Nonobstructive  . Dyslipidemia   . PSVT (paroxysmal supraventricular tachycardia) ([place])   . TIA (transient ischemic attack)   . Type 2 diabetes mellitus ([place])    Diet controlled    Past Surgical History:  Procedure Laterality Date  . ABDOMINAL HYSTERECTOMY    . CARPAL TUNNEL RELEASE     x's 2  . CHOLECYSTECTOMY    . REPLACEMENT TOTAL KNEE  2002 & 2012  . SHOULDER SURGERY  2001    Current Outpatient Medications  Medication Sig Dispense Refill  . acetaminophen (TYLENOL) 500 MG tablet Take 1,000 mg by mouth 2 (two) times daily.    Marland Kitchen atenolol (TENORMIN) 50 MG tablet Take 1 tablet (50 mg total) by mouth 2 (two) times daily. 180 tablet 3  . Calcium Carbonate-Vitamin D 600-400 MG-UNIT tablet Take 1 tablet by mouth daily.    Marland Kitchen docusate sodium (COLACE) 100 MG capsule Take 100 mg by mouth daily as needed.     Marland Kitchen ELIQUIS 5 MG TABS tablet Take 5 mg by mouth 2 (two) times daily.    Marland Kitchen losartan (COZAAR) 25 MG tablet Take 0.5  tablets (12.5 mg total) by mouth daily. 45 tablet 3  . meclizine (ANTIVERT) 25 MG tablet Take 1 tablet by mouth 3 (three) times daily as needed.    . metFORMIN (GLUCOPHAGE-XR) 500 MG 24 hr tablet Take 2 tabs (1,'000mg'$ ) by mouth every morning & 1 tab ('500mg'$ ) every evening    . Multiple Vitamin (MULTIVITAMIN) tablet Take 1 tablet by mouth daily.    . multivitamin-lutein (OCUVITE-LUTEIN) CAPS capsule Take 1 capsule by mouth daily.    . nitroGLYCERIN (NITROSTAT) 0.4 MG SL tablet Place 1 tablet (0.4 mg total) under the tongue every 5 (five) minutes x 3 doses as needed for chest pain (if no relief after 3rd dose, proceed to the ED for an evaluation or call 911). 25 tablet 3  . simvastatin (ZOCOR) 10 MG tablet Take 10 mg by mouth at bedtime.     No current facility-administered medications for this visit.   Allergies:  Toprol xl [metoprolol succinate]   ROS: No syncope.  No recent falls.  Uses a cane to ambulate.  Physical Exam: VS:  BP 140/78   Pulse 60   Ht 5' (1.524 m)   Wt 138 lb (62.6 kg)   SpO2 99%   BMI 26.95 kg/m , BMI Body mass index is 26.95 kg/m.  Wt Readings from Last 3 Encounters:  01/04/21 138 lb (  62.6 kg)  08/03/20 139 lb 9.6 oz (63.3 kg)  05/27/20 141 lb (64 kg)    General: Elderly woman, appears comfortable at rest. HEENT: Conjunctiva and lids normal, wearing a mask. Neck: Supple, no elevated JVP or carotid bruits, no thyromegaly. Lungs: Clear to auscultation, nonlabored breathing at rest. Cardiac: Irregularly irregular, soft systolic murmur, no gallop. Extremities: No pitting edema.  ECG:  An ECG dated 06/01/2020 was personally reviewed today and demonstrated:  Rate controlled atrial fibrillation.  Recent Labwork: 07/27/2020: Hemoglobin 12.2; Platelets 313 10/03/2020: BUN 24; Creatinine, Ser 0.75; Potassium 5.0; Sodium 139   Other Studies Reviewed Today:  Echocardiogram 03/23/2020: Summary  1. The left ventricle is normal in size with normal wall thickness.  2.  The left ventricular systolic function is normal, LVEF is visually  estimated at 60-65%.  3. The left atrium is moderately dilated in size.  4. The aortic valve is trileaflet with mildly thickened leaflets with normal  excursion.  5. The right ventricle is normal in size, with normal systolic function.  6. The right atrium is mildly dilated in size.   Assessment and Plan:  1.  Permanent atrial fibrillation.  CHA2DS2-VASc score is 7.  She remains symptomatically stable on atenolol for heart rate control, also Eliquis for stroke prophylaxis without spontaneous bleeding problems.  Requesting interval lab work from Dr. Quintin Alto.  No changes were made today.  2.  Mixed hyperlipidemia, followed by Dr. Quintin Alto on Zocor.  Medication Adjustments/Labs and Tests Ordered: Current medicines are reviewed at length with the patient today.  Concerns regarding medicines are outlined above.   Tests Ordered: No orders of the defined types were placed in this encounter.   Medication Changes: No orders of the defined types were placed in this encounter.   Disposition:  Follow up 6 months in the [place] office.  Signed, Satira Sark, MD, [facility] 01/04/2021 8:29 AM    [facility] [place] [address] [place], [place] [address] Phone: [phone]; Fax: [phone]

## 2021-01-04 ENCOUNTER — Encounter: Payer: Self-pay | Admitting: *Deleted

## 2021-01-04 ENCOUNTER — Ambulatory Visit (INDEPENDENT_AMBULATORY_CARE_PROVIDER_SITE_OTHER): Payer: Medicare Other | Admitting: Cardiology

## 2021-01-04 ENCOUNTER — Encounter: Payer: Self-pay | Admitting: Cardiology

## 2021-01-04 ENCOUNTER — Other Ambulatory Visit: Payer: Self-pay

## 2021-01-04 VITALS — BP 140/78 | HR 60 | Ht 60.0 in | Wt 138.0 lb

## 2021-01-04 DIAGNOSIS — E782 Mixed hyperlipidemia: Secondary | ICD-10-CM | POA: Diagnosis not present

## 2021-01-04 DIAGNOSIS — I4819 Other persistent atrial fibrillation: Secondary | ICD-10-CM

## 2021-01-04 NOTE — Patient Instructions (Signed)
Medication Instructions:  °Your physician recommends that you continue on your current medications as directed. Please refer to the Current Medication list given to you today. ° °*If you need a refill on your cardiac medications before your next appointment, please call your pharmacy* ° ° °Lab Work: °None today °If you have labs (blood work) drawn today and your tests are completely normal, you will receive your results only by: °• MyChart Message (if you have MyChart) OR °• A paper copy in the mail °If you have any lab test that is abnormal or we need to change your treatment, we will call you to review the results. ° ° °Testing/Procedures: °None today ° ° °Follow-Up: °At [facility], you and your health needs are our priority.  As part of our continuing mission to provide you with exceptional heart care, we have created designated Provider Care Teams.  These Care Teams include your primary Cardiologist (physician) and Advanced Practice Providers (APPs -  Physician Assistants and Nurse Practitioners) who all work together to provide you with the care you need, when you need it. ° °We recommend signing up for the patient portal called "MyChart".  Sign up information is provided on this After Visit Summary.  MyChart is used to connect with patients for Virtual Visits (Telemedicine).  Patients are able to view lab/test results, encounter notes, upcoming appointments, etc.  Non-urgent messages can be sent to your provider as well.   °To learn more about what you can do with MyChart, go to [email or web address].   ° °Your next appointment:   °6 month(s) ° °The format for your next appointment:   °In Person ° °Provider:   °Samuel McDowell, MD ° ° °Other Instructions °None ° ° ° ° °Thank you for choosing [facility] ! ° ° ° ° ° ° ° ° °

## 2021-01-10 ENCOUNTER — Telehealth: Payer: Self-pay | Admitting: *Deleted

## 2021-01-10 NOTE — Telephone Encounter (Signed)
Spoke with patient and informed her that she has to pay $78.47 out of pocket before she can get [employer] patient assistance. Pt voiced understanding.

## 2021-01-16 DIAGNOSIS — E119 Type 2 diabetes mellitus without complications: Secondary | ICD-10-CM | POA: Diagnosis not present

## 2021-03-16 DIAGNOSIS — Z23 Encounter for immunization: Secondary | ICD-10-CM | POA: Diagnosis not present

## 2021-03-18 DIAGNOSIS — E1122 Type 2 diabetes mellitus with diabetic chronic kidney disease: Secondary | ICD-10-CM | POA: Diagnosis not present

## 2021-03-18 DIAGNOSIS — N183 Chronic kidney disease, stage 3 unspecified: Secondary | ICD-10-CM | POA: Diagnosis not present

## 2021-03-18 DIAGNOSIS — E875 Hyperkalemia: Secondary | ICD-10-CM | POA: Diagnosis not present

## 2021-03-18 DIAGNOSIS — E119 Type 2 diabetes mellitus without complications: Secondary | ICD-10-CM | POA: Diagnosis not present

## 2021-03-18 DIAGNOSIS — I129 Hypertensive chronic kidney disease with stage 1 through stage 4 chronic kidney disease, or unspecified chronic kidney disease: Secondary | ICD-10-CM | POA: Diagnosis not present

## 2021-03-24 DIAGNOSIS — E1165 Type 2 diabetes mellitus with hyperglycemia: Secondary | ICD-10-CM | POA: Diagnosis not present

## 2021-03-24 DIAGNOSIS — E1140 Type 2 diabetes mellitus with diabetic neuropathy, unspecified: Secondary | ICD-10-CM | POA: Diagnosis not present

## 2021-03-24 DIAGNOSIS — E7801 Familial hypercholesterolemia: Secondary | ICD-10-CM | POA: Diagnosis not present

## 2021-03-24 DIAGNOSIS — E7800 Pure hypercholesterolemia, unspecified: Secondary | ICD-10-CM | POA: Diagnosis not present

## 2021-03-24 DIAGNOSIS — E875 Hyperkalemia: Secondary | ICD-10-CM | POA: Diagnosis not present

## 2021-03-24 DIAGNOSIS — E1122 Type 2 diabetes mellitus with diabetic chronic kidney disease: Secondary | ICD-10-CM | POA: Diagnosis not present

## 2021-03-24 DIAGNOSIS — I10 Essential (primary) hypertension: Secondary | ICD-10-CM | POA: Diagnosis not present

## 2021-03-29 DIAGNOSIS — I482 Chronic atrial fibrillation, unspecified: Secondary | ICD-10-CM | POA: Diagnosis not present

## 2021-03-29 DIAGNOSIS — E1121 Type 2 diabetes mellitus with diabetic nephropathy: Secondary | ICD-10-CM | POA: Diagnosis not present

## 2021-03-29 DIAGNOSIS — I10 Essential (primary) hypertension: Secondary | ICD-10-CM | POA: Diagnosis not present

## 2021-03-29 DIAGNOSIS — E1140 Type 2 diabetes mellitus with diabetic neuropathy, unspecified: Secondary | ICD-10-CM | POA: Diagnosis not present

## 2021-03-29 DIAGNOSIS — E1165 Type 2 diabetes mellitus with hyperglycemia: Secondary | ICD-10-CM | POA: Diagnosis not present

## 2021-03-29 DIAGNOSIS — E7801 Familial hypercholesterolemia: Secondary | ICD-10-CM | POA: Diagnosis not present

## 2021-04-18 DIAGNOSIS — E119 Type 2 diabetes mellitus without complications: Secondary | ICD-10-CM | POA: Diagnosis not present

## 2021-05-18 DIAGNOSIS — E1122 Type 2 diabetes mellitus with diabetic chronic kidney disease: Secondary | ICD-10-CM | POA: Diagnosis not present

## 2021-05-18 DIAGNOSIS — E875 Hyperkalemia: Secondary | ICD-10-CM | POA: Diagnosis not present

## 2021-05-18 DIAGNOSIS — I129 Hypertensive chronic kidney disease with stage 1 through stage 4 chronic kidney disease, or unspecified chronic kidney disease: Secondary | ICD-10-CM | POA: Diagnosis not present

## 2021-05-18 DIAGNOSIS — N183 Chronic kidney disease, stage 3 unspecified: Secondary | ICD-10-CM | POA: Diagnosis not present

## 2021-05-18 DIAGNOSIS — E119 Type 2 diabetes mellitus without complications: Secondary | ICD-10-CM | POA: Diagnosis not present

## 2021-06-18 DIAGNOSIS — E119 Type 2 diabetes mellitus without complications: Secondary | ICD-10-CM | POA: Diagnosis not present

## 2021-07-17 ENCOUNTER — Other Ambulatory Visit (HOSPITAL_COMMUNITY)
Admission: RE | Admit: 2021-07-17 | Discharge: 2021-07-17 | Disposition: A | Discharge status: Home / Self Care | Payer: Medicare Other | Source: Ambulatory Visit | Attending: Cardiology | Admitting: Cardiology

## 2021-07-17 ENCOUNTER — Ambulatory Visit (INDEPENDENT_AMBULATORY_CARE_PROVIDER_SITE_OTHER): Payer: Medicare Other | Admitting: Cardiology

## 2021-07-17 ENCOUNTER — Encounter: Payer: Self-pay | Admitting: Cardiology

## 2021-07-17 ENCOUNTER — Other Ambulatory Visit: Payer: Self-pay

## 2021-07-17 VITALS — BP 130/84 | HR 93 | Ht 60.0 in | Wt 138.0 lb

## 2021-07-17 DIAGNOSIS — I4821 Permanent atrial fibrillation: Secondary | ICD-10-CM | POA: Diagnosis not present

## 2021-07-17 DIAGNOSIS — I471 Supraventricular tachycardia: Secondary | ICD-10-CM

## 2021-07-17 DIAGNOSIS — Z79899 Other long term (current) drug therapy: Secondary | ICD-10-CM | POA: Diagnosis not present

## 2021-07-17 DIAGNOSIS — E782 Mixed hyperlipidemia: Secondary | ICD-10-CM | POA: Diagnosis not present

## 2021-07-17 LAB — BASIC METABOLIC PANEL
Anion gap: 6 (ref 5–15)
BUN: 21 mg/dL (ref 8–23)
CO2: 27 mmol/L (ref 22–32)
Calcium: 9.2 mg/dL (ref 8.9–10.3)
Chloride: 101 mmol/L (ref 98–111)
Creatinine, Ser: 0.8 mg/dL (ref 0.44–1.00)
GFR, Estimated: >60 mL/min (ref >60)
Glucose, Bld: 140 mg/dL — ABNORMAL HIGH (ref 70–99)
Potassium: 5 mmol/L (ref 3.5–5.1)
Sodium: 134 mmol/L — ABNORMAL LOW (ref 135–145)

## 2021-07-17 LAB — CBC
HCT: 35.9 % — ABNORMAL LOW (ref 36.0–46.0)
Hemoglobin: 11.7 g/dL — ABNORMAL LOW (ref 12.0–15.0)
MCH: 31.8 pg (ref 26.0–34.0)
MCHC: 32.6 g/dL (ref 30.0–36.0)
MCV: 97.6 fL (ref 80.0–100.0)
Platelets: 291 10*3/uL (ref 150–400)
RBC: 3.68 MIL/uL — ABNORMAL LOW (ref 3.87–5.11)
RDW: 13.2 % (ref 11.5–15.5)
WBC: 7.8 10*3/uL (ref 4.0–10.5)
nRBC: 0 % (ref 0.0–0.2)

## 2021-07-17 MED ORDER — ELIQUIS 5 MG PO TABS
5.0000 mg | ORAL_TABLET | Freq: Two times a day (BID) | ORAL | 3 refills | Status: DC
Start: 2021-07-17 — End: 2021-12-04

## 2021-07-17 NOTE — Patient Instructions (Addendum)
Medication Instructions:  Your physician recommends that you continue on your current medications as directed. Please refer to the Current Medication list given to you today.  *If you need a refill on your cardiac medications before your next appointment, please call your pharmacy*   Lab Work: CBC, BMET today  If you have labs (blood work) drawn today and your tests are completely normal, you will receive your results only by: [place] (if you have MyChart) OR A paper copy in the mail If you have any lab test that is abnormal or we need to change your treatment, we will call you to review the results.   Testing/Procedures: None today    Follow-Up: At [facility], you and your health needs are our priority.  As part of our continuing mission to provide you with exceptional heart care, we have created designated Provider Care Teams.  These Care Teams include your primary Cardiologist (physician) and Advanced Practice Providers (APPs -  Physician Assistants and Nurse Practitioners) who all work together to provide you with the care you need, when you need it.  We recommend signing up for the patient portal called "MyChart".  Sign up information is provided on this After Visit Summary.  MyChart is used to connect with patients for Virtual Visits (Telemedicine).  Patients are able to view lab/test results, encounter notes, upcoming appointments, etc.  Non-urgent messages can be sent to your provider as well.   To learn more about what you can do with MyChart, go to [email or web address].    Your next appointment:   6 month(s)  The format for your next appointment:   In Person  Provider:   Rozann Lesches, MD   Other Instructions None

## 2021-07-17 NOTE — Progress Notes (Signed)
Cardiology Office Note  Date: 07/17/2021   ID: [name], DOB [birth year], MRN [id number]  PCP:  Manon Hilding, MD  Cardiologist:  Rozann Lesches, MD Electrophysiologist:  None   Chief Complaint  Patient presents with   Cardiac follow-up    History of Present Illness: [name] is a 87 y.o. female last seen in February.  She is here today with family member for a follow-up visit.  Reports no palpitations or chest pain.  Main limitation is arthritic pain.  She does use a cane, no recent falls.  I reviewed her lab work from earlier in the year, we discussed getting a follow-up CBC and BMET.  She does not describe any spontaneous bleeding problems on Eliquis.  I personally reviewed her ECG today which shows rate controlled atrial fibrillation.  She remains on atenolol.  Past Medical History:  Diagnosis Date   Atrial fibrillation ([place])    Coronary atherosclerosis of native coronary artery    Nonobstructive   Dyslipidemia    PSVT (paroxysmal supraventricular tachycardia) (HCC)    TIA (transient ischemic attack)    Type 2 diabetes mellitus ([place])    Diet controlled    Past Surgical History:  Procedure Laterality Date   ABDOMINAL HYSTERECTOMY     CARPAL TUNNEL RELEASE     x's 2   CHOLECYSTECTOMY     REPLACEMENT TOTAL KNEE  2002 & 2012   SHOULDER SURGERY  2001    Current Outpatient Medications  Medication Sig Dispense Refill   acetaminophen (TYLENOL) 500 MG tablet Take 1,000 mg by mouth 2 (two) times daily.     atenolol (TENORMIN) 50 MG tablet Take 1 tablet (50 mg total) by mouth 2 (two) times daily. 180 tablet 3   Calcium Carbonate-Vitamin D 600-400 MG-UNIT tablet Take 1 tablet by mouth daily.     docusate sodium (COLACE) 100 MG capsule Take 100 mg by mouth daily as needed.      losartan (COZAAR) 25 MG tablet Take 0.5 tablets (12.5 mg total) by mouth daily. 45 tablet 3   meclizine (ANTIVERT) 25 MG tablet Take 1 tablet by mouth 3 (three) times daily as needed.      metFORMIN (GLUCOPHAGE-XR) 500 MG 24 hr tablet Take 2 tabs (1,'000mg'$ ) by mouth every morning & 1 tab ('500mg'$ ) every evening     Multiple Vitamin (MULTIVITAMIN) tablet Take 1 tablet by mouth daily.     multivitamin-lutein (OCUVITE-LUTEIN) CAPS capsule Take 1 capsule by mouth daily.     nitroGLYCERIN (NITROSTAT) 0.4 MG SL tablet Place 1 tablet (0.4 mg total) under the tongue every 5 (five) minutes x 3 doses as needed for chest pain (if no relief after 3rd dose, proceed to the ED for an evaluation or call 911). 25 tablet 3   simvastatin (ZOCOR) 10 MG tablet Take 10 mg by mouth at bedtime.     ELIQUIS 5 MG TABS tablet Take 1 tablet (5 mg total) by mouth 2 (two) times daily. 60 tablet 3   No current facility-administered medications for this visit.   Allergies:  Toprol xl [metoprolol succinate]   ROS: No orthopnea or PND.  Physical Exam: VS:  BP 130/84   Pulse 93   Ht 5' (1.524 m)   Wt 138 lb (62.6 kg)   SpO2 97%   BMI 26.95 kg/m , BMI Body mass index is 26.95 kg/m.  Wt Readings from Last 3 Encounters:  07/17/21 138 lb (62.6 kg)  01/04/21 138 lb (62.6 kg)  08/03/20 139 lb 9.6 oz (63.3 kg)    General: Patient appears comfortable at rest. HEENT: Conjunctiva and lids normal, wearing a mask. Neck: Supple, no elevated JVP or carotid bruits, no thyromegaly. Lungs: Clear to auscultation, nonlabored breathing at rest. Cardiac: Irregularly irregular, no S3, 1/6 systolic murmur. Extremities: No pitting edema.  ECG:  An ECG dated 06/01/2020 was personally reviewed today and demonstrated:  Atrial fibrillation.  Recent Labwork: 07/27/2020: Hemoglobin 12.2; Platelets 313 10/03/2020: BUN 24; Creatinine, Ser 0.75; Potassium 5.0; Sodium 139  January 2022: Cholesterol 143, triglycerides 121, HDL 57, LDL 65, BUN 24, creatinine 0.89, potassium 5.2, AST 12, ALT 7, hemoglobin 12.12, platelets 318  Other Studies Reviewed Today:  Echocardiogram 03/23/2020: Summary    1. The left ventricle is normal in size  with normal wall thickness.    2. The left ventricular systolic function is normal, LVEF is visually   estimated at 60-65%.    3. The left atrium is moderately dilated in size.    4. The aortic valve is trileaflet with mildly thickened leaflets with normal   excursion.    5. The right ventricle is normal in size, with normal systolic function.    6. The right atrium is mildly dilated  in size.    Assessment and Plan:  1.  Permanent atrial fibrillation with CHA2DS2-VASc score of 7.  She is asymptomatic, no active palpitations or chest pain.  Continue atenolol for heart rate control and Eliquis for stroke prophylaxis.  No bleeding problems reported.  Check CBC and BMET.  2.  Mixed hyperlipidemia, continues on Zocor.  LDL 65 in January.  Medication Adjustments/Labs and Tests Ordered: Current medicines are reviewed at length with the patient today.  Concerns regarding medicines are outlined above.   Tests Ordered: Orders Placed This Encounter  Procedures   CBC   Basic metabolic panel   EKG [id number]    Medication Changes: Meds ordered this encounter  Medications   ELIQUIS 5 MG TABS tablet    Sig: Take 1 tablet (5 mg total) by mouth 2 (two) times daily.    Dispense:  60 tablet    Refill:  3     Disposition:  Follow up  6 months.  Signed, Satira Sark, MD, [facility] 07/17/2021 1:07 PM    [place] [facility] [address] [place], [place] [address] Phone: [phone]; Fax: [phone]

## 2021-07-19 DIAGNOSIS — E1122 Type 2 diabetes mellitus with diabetic chronic kidney disease: Secondary | ICD-10-CM | POA: Diagnosis not present

## 2021-07-19 DIAGNOSIS — N183 Chronic kidney disease, stage 3 unspecified: Secondary | ICD-10-CM | POA: Diagnosis not present

## 2021-07-19 DIAGNOSIS — I129 Hypertensive chronic kidney disease with stage 1 through stage 4 chronic kidney disease, or unspecified chronic kidney disease: Secondary | ICD-10-CM | POA: Diagnosis not present

## 2021-07-19 DIAGNOSIS — E875 Hyperkalemia: Secondary | ICD-10-CM | POA: Diagnosis not present

## 2021-07-19 DIAGNOSIS — E119 Type 2 diabetes mellitus without complications: Secondary | ICD-10-CM | POA: Diagnosis not present

## 2021-08-01 DIAGNOSIS — N183 Chronic kidney disease, stage 3 unspecified: Secondary | ICD-10-CM | POA: Diagnosis not present

## 2021-08-01 DIAGNOSIS — E7801 Familial hypercholesterolemia: Secondary | ICD-10-CM | POA: Diagnosis not present

## 2021-08-01 DIAGNOSIS — E1165 Type 2 diabetes mellitus with hyperglycemia: Secondary | ICD-10-CM | POA: Diagnosis not present

## 2021-08-01 DIAGNOSIS — E7800 Pure hypercholesterolemia, unspecified: Secondary | ICD-10-CM | POA: Diagnosis not present

## 2021-08-01 DIAGNOSIS — E1122 Type 2 diabetes mellitus with diabetic chronic kidney disease: Secondary | ICD-10-CM | POA: Diagnosis not present

## 2021-08-01 DIAGNOSIS — I10 Essential (primary) hypertension: Secondary | ICD-10-CM | POA: Diagnosis not present

## 2021-08-03 DIAGNOSIS — E1165 Type 2 diabetes mellitus with hyperglycemia: Secondary | ICD-10-CM | POA: Diagnosis not present

## 2021-08-03 DIAGNOSIS — Z23 Encounter for immunization: Secondary | ICD-10-CM | POA: Diagnosis not present

## 2021-08-03 DIAGNOSIS — E1121 Type 2 diabetes mellitus with diabetic nephropathy: Secondary | ICD-10-CM | POA: Diagnosis not present

## 2021-08-03 DIAGNOSIS — R0989 Other specified symptoms and signs involving the circulatory and respiratory systems: Secondary | ICD-10-CM | POA: Diagnosis not present

## 2021-08-03 DIAGNOSIS — I10 Essential (primary) hypertension: Secondary | ICD-10-CM | POA: Diagnosis not present

## 2021-08-03 DIAGNOSIS — I482 Chronic atrial fibrillation, unspecified: Secondary | ICD-10-CM | POA: Diagnosis not present

## 2021-08-03 DIAGNOSIS — E1140 Type 2 diabetes mellitus with diabetic neuropathy, unspecified: Secondary | ICD-10-CM | POA: Diagnosis not present

## 2021-08-06 DIAGNOSIS — N3000 Acute cystitis without hematuria: Secondary | ICD-10-CM | POA: Diagnosis not present

## 2021-08-06 DIAGNOSIS — I951 Orthostatic hypotension: Secondary | ICD-10-CM | POA: Diagnosis not present

## 2021-08-06 DIAGNOSIS — R790 Abnormal level of blood mineral: Secondary | ICD-10-CM | POA: Diagnosis not present

## 2021-08-06 DIAGNOSIS — R000 Tachycardia, unspecified: Secondary | ICD-10-CM | POA: Diagnosis not present

## 2021-08-06 DIAGNOSIS — M25511 Pain in right shoulder: Secondary | ICD-10-CM | POA: Diagnosis not present

## 2021-08-06 DIAGNOSIS — Z90710 Acquired absence of both cervix and uterus: Secondary | ICD-10-CM | POA: Diagnosis not present

## 2021-08-06 DIAGNOSIS — I959 Hypotension, unspecified: Secondary | ICD-10-CM | POA: Diagnosis not present

## 2021-08-06 DIAGNOSIS — M47812 Spondylosis without myelopathy or radiculopathy, cervical region: Secondary | ICD-10-CM | POA: Diagnosis not present

## 2021-08-06 DIAGNOSIS — I10 Essential (primary) hypertension: Secondary | ICD-10-CM | POA: Diagnosis not present

## 2021-08-06 DIAGNOSIS — R42 Dizziness and giddiness: Secondary | ICD-10-CM | POA: Diagnosis not present

## 2021-08-06 DIAGNOSIS — I4891 Unspecified atrial fibrillation: Secondary | ICD-10-CM | POA: Diagnosis not present

## 2021-08-06 DIAGNOSIS — R102 Pelvic and perineal pain: Secondary | ICD-10-CM | POA: Diagnosis not present

## 2021-08-06 DIAGNOSIS — M79604 Pain in right leg: Secondary | ICD-10-CM | POA: Diagnosis not present

## 2021-08-06 DIAGNOSIS — W19XXXA Unspecified fall, initial encounter: Secondary | ICD-10-CM | POA: Diagnosis not present

## 2021-08-06 DIAGNOSIS — G8911 Acute pain due to trauma: Secondary | ICD-10-CM | POA: Diagnosis not present

## 2021-08-06 DIAGNOSIS — I700 Atherosclerosis of aorta: Secondary | ICD-10-CM | POA: Diagnosis not present

## 2021-08-06 DIAGNOSIS — E119 Type 2 diabetes mellitus without complications: Secondary | ICD-10-CM | POA: Diagnosis not present

## 2021-08-06 DIAGNOSIS — Z043 Encounter for examination and observation following other accident: Secondary | ICD-10-CM | POA: Diagnosis not present

## 2021-08-06 DIAGNOSIS — R7989 Other specified abnormal findings of blood chemistry: Secondary | ICD-10-CM | POA: Diagnosis not present

## 2021-08-06 DIAGNOSIS — I2510 Atherosclerotic heart disease of native coronary artery without angina pectoris: Secondary | ICD-10-CM | POA: Diagnosis not present

## 2021-08-07 ENCOUNTER — Other Ambulatory Visit: Payer: Self-pay | Admitting: Student

## 2021-08-10 DIAGNOSIS — Z23 Encounter for immunization: Secondary | ICD-10-CM | POA: Diagnosis not present

## 2021-08-11 ENCOUNTER — Encounter: Payer: Self-pay | Admitting: Student

## 2021-08-11 ENCOUNTER — Ambulatory Visit (INDEPENDENT_AMBULATORY_CARE_PROVIDER_SITE_OTHER): Payer: Medicare Other | Admitting: Student

## 2021-08-11 ENCOUNTER — Other Ambulatory Visit: Payer: Self-pay

## 2021-08-11 VITALS — BP 144/80 | HR 88 | Ht 60.0 in | Wt 138.0 lb

## 2021-08-11 DIAGNOSIS — E782 Mixed hyperlipidemia: Secondary | ICD-10-CM | POA: Diagnosis not present

## 2021-08-11 DIAGNOSIS — I951 Orthostatic hypotension: Secondary | ICD-10-CM

## 2021-08-11 DIAGNOSIS — I4821 Permanent atrial fibrillation: Secondary | ICD-10-CM

## 2021-08-11 NOTE — Progress Notes (Signed)
Cardiology Office Note    Date:  08/11/2021   ID:  [name], DOB 05/10/1927, MRN [id number]  PCP:  Manon Hilding, MD  Cardiologist: Rozann Lesches, MD    Chief Complaint  Patient presents with   Follow-up    Recent Emergency Dept visit    History of Present Illness:    [name] is a 87 y.o. female with past medical history of persistent atrial fibrillation (on Eliquis), pSVT, HLD and prior TIA who presents to the office today for follow-up from a recent Emergency Department visit.   She was examined by Dr. Domenic Polite last month and denied any recent chest pain or palpitations. She was continued on her current cardiac medications at that time including Atenolol '50mg'$  BID and Eliquis.  In the interim, she presented to [facility] ED on 08/06/2021 for evaluation of near syncope and a fall. She reported not actually losing consciousness. She was in atrial fibrillation with RVR on arrival. [facility] Troponin values were negative but pro-BNP was elevated to 1934. She was found to have orthostatic hypotension and also had evidence of a UTI. She was given a dose of Rocephin along with PO antibiotics to take at home. She did receive a dose of IV Cardizem for her elevated heart rate and rates improved prior to discharge.  In talking with the patient and her daughter today, she reports her weakness has improved since her recent ED visit. She continues to have intermittent episodes of dizziness and orthostatics were checked today with her blood pressure dropping from 144/80 with lying down to 122/84 with sitting and further declined to 108/78 with standing. She did report associated dizziness with this. Denies any recurrent presyncopal events. She is trying to stay hydrated and reports consuming over 8 glasses of water a day. Denies any recent chest pain or persistent palpitations. No reported orthopnea, PND or pitting edema.  Past Medical History:  Diagnosis Date   Atrial fibrillation ([place])     Coronary atherosclerosis of native coronary artery    Nonobstructive   Dyslipidemia    PSVT (paroxysmal supraventricular tachycardia) (HCC)    TIA (transient ischemic attack)    Type 2 diabetes mellitus ([place])    Diet controlled    Past Surgical History:  Procedure Laterality Date   ABDOMINAL HYSTERECTOMY     CARPAL TUNNEL RELEASE     x's 2   CHOLECYSTECTOMY     REPLACEMENT TOTAL KNEE  2002 & 2012   SHOULDER SURGERY  2001    Current Medications: Outpatient Medications Prior to Visit  Medication Sig Dispense Refill   acetaminophen (TYLENOL) 500 MG tablet Take 1,000 mg by mouth 2 (two) times daily.     atenolol (TENORMIN) 50 MG tablet TAKE 1 TABLET BY MOUTH TWICE A DAY 180 tablet 3   Calcium Carbonate-Vitamin D 600-400 MG-UNIT tablet Take 1 tablet by mouth daily.     cefUROXime (CEFTIN) 500 MG tablet Take 500 mg by mouth 2 (two) times daily.     docusate sodium (COLACE) 100 MG capsule Take 100 mg by mouth daily as needed.      ELIQUIS 5 MG TABS tablet Take 1 tablet (5 mg total) by mouth 2 (two) times daily. 60 tablet 3   meclizine (ANTIVERT) 25 MG tablet Take 1 tablet by mouth 3 (three) times daily as needed.     metFORMIN (GLUCOPHAGE-XR) 500 MG 24 hr tablet Take 2 tabs (1,'000mg'$ ) by mouth every morning & 1 tab ('500mg'$ ) every evening  multivitamin-lutein (OCUVITE-LUTEIN) CAPS capsule Take 1 capsule by mouth daily.     nitroGLYCERIN (NITROSTAT) 0.4 MG SL tablet Place 1 tablet (0.4 mg total) under the tongue every 5 (five) minutes x 3 doses as needed for chest pain (if no relief after 3rd dose, proceed to the ED for an evaluation or call 911). 25 tablet 3   simvastatin (ZOCOR) 10 MG tablet Take 10 mg by mouth at bedtime.     losartan (COZAAR) 25 MG tablet Take 0.5 tablets (12.5 mg total) by mouth daily. 45 tablet 3   Multiple Vitamin (MULTIVITAMIN) tablet Take 1 tablet by mouth daily. (Patient not taking: Reported on 08/11/2021)     No facility-administered medications prior to visit.      Allergies:   Toprol xl [metoprolol succinate]   Social History   Socioeconomic History   Marital status: Widowed    Spouse name: Not on file   Number of children: Not on file   Years of education: Not on file   Highest education level: Not on file  Occupational History   Not on file  Tobacco Use   Smoking status: Never   Smokeless tobacco: Never  Vaping Use   Vaping Use: Never used  Substance and Sexual Activity   Alcohol use: No    Alcohol/week: 0.0 standard drinks   Drug use: No   Sexual activity: Not on file  Other Topics Concern   Not on file  Social History Narrative   Not on file   Social Determinants of Health   Financial Resource Strain: Not on file  Food Insecurity: Not on file  Transportation Needs: Not on file  Physical Activity: Not on file  Stress: Not on file  Social Connections: Not on file     Family History:  The patient's family history includes Coronary artery disease in her mother.   Review of Systems:    Please see the history of present illness.     All other systems reviewed and are otherwise negative except as noted above.   Physical Exam:    VS:  BP (!) 144/80   Pulse 88   Ht 5' (1.524 m)   Wt 138 lb (62.6 kg)   SpO2 96%   BMI 26.95 kg/m    General: Pleasant elderly female appearing in no acute distress. Head: Normocephalic, atraumatic. Neck: No carotid bruits. JVD not elevated.  Lungs: Respirations regular and unlabored, without wheezes or rales.  Heart: Irregularly irregular. No S3 or S4.  No murmur, no rubs, or gallops appreciated. Abdomen: Appears non-distended. No obvious abdominal masses. Msk:  Strength and tone appear normal for age. No obvious joint deformities or effusions. Extremities: No clubbing or cyanosis. No pitting edema.  Distal pedal pulses are 2+ bilaterally. Compression stockings in place.  Neuro: Alert and oriented X 3. Moves all extremities spontaneously. No focal deficits noted. Psych:  Responds to  questions appropriately with a normal affect. Skin: No rashes or lesions noted  Wt Readings from Last 3 Encounters:  08/11/21 138 lb (62.6 kg)  07/17/21 138 lb (62.6 kg)  01/04/21 138 lb (62.6 kg)    Studies/Labs Reviewed:   EKG:  EKG is not ordered today.   Recent Labs: 07/17/2021: BUN 21; Creatinine, Ser 0.80; Hemoglobin 11.7; Platelets 291; Potassium 5.0; Sodium 134   Lipid Panel No results found for: CHOL, TRIG, HDL, CHOLHDL, VLDL, LDLCALC, LDLDIRECT  Additional studies/ records that were reviewed today include:   Echocardiogram: 03/2020 Summary    1. The left  ventricle is normal in size with normal wall thickness.    2. The left ventricular systolic function is normal, LVEF is visually  estimated at 60-65%.    3. The left atrium is moderately dilated in size.    4. The aortic valve is trileaflet with mildly thickened leaflets with normal  excursion.    5. The right ventricle is normal in size, with normal systolic function.    6. The right atrium is mildly dilated  in size.   Assessment:    1. Permanent atrial fibrillation ([place])   2. Orthostatic hypotension   3. Mixed hyperlipidemia      Plan:   In order of problems listed above:  1. Persistent Atrial Fibrillation - She does experience occasional palpitations but denies any persistent symptoms. Her heart rate has improved with further management of her UTI. Will continue Atenolol 50 mg twice daily for rate control. - Recent labs from 08/06/2021 showed her hemoglobin was stable at 12.1 with platelets at 323.  She remains on Eliquis 5 mg twice daily for anticoagulation which is the appropriate dose at this time given her age, weight and kidney function.  2. Orthostatic Hypotension - She was orthostatic during recent ED evaluation and remains orthostatic on evaluation today with associated dizziness. She is currently on Atenolol 50 mg twice daily and Losartan 12.5 mg daily. I recommended that we discontinue Losartan  at this time. We discussed her obtaining a blood pressure cuff and following readings at home. She was encouraged to continue to increase her hydration and also use compression stockings.   3. HLD - Followed by her PCP. She remains on Simvastatin 10 mg daily.   Medication Adjustments/Labs and Tests Ordered: Current medicines are reviewed at length with the patient today.  Concerns regarding medicines are outlined above.  Medication changes, Labs and Tests ordered today are listed in the Patient Instructions below. Patient Instructions  Medication Instructions:   STOP Losartan   Call us [phone]) if your blood pressure is consistently running 150/90 or above   *If you need a refill on your cardiac medications before your next appointment, please call your pharmacy*   Lab Work: None today  If you have labs (blood work) drawn today and your tests are completely normal, you will receive your results only by: [place] (if you have MyChart) OR A paper copy in the mail If you have any lab test that is abnormal or we need to change your treatment, we will call you to review the results.   Testing/Procedures:   None today    Follow-Up: At [facility], you and your health needs are our priority.  As part of our continuing mission to provide you with exceptional heart care, we have created designated Provider Care Teams.  These Care Teams include your primary Cardiologist (physician) and Advanced Practice Providers (APPs -  Physician Assistants and Nurse Practitioners) who all work together to provide you with the care you need, when you need it.  We recommend signing up for the patient portal called "MyChart".  Sign up information is provided on this After Visit Summary.  MyChart is used to connect with patients for Virtual Visits (Telemedicine).  Patients are able to view lab/test results, encounter notes, upcoming appointments, etc.  Non-urgent messages can be sent to  your provider as well.   To learn more about what you can do with MyChart, go to [email or web address].    Your next appointment:  Keep 12/27/21 apt with Dr.McDowell at  1020 am   Signed, Erma Heritage, PA-C  08/11/2021 5:10 PM    [facility] [address] [place], [place] [address] Phone: [phone] Fax: [phone]

## 2021-08-11 NOTE — Patient Instructions (Signed)
Medication Instructions:   STOP Losartan   Call us [phone]) if your blood pressure is consistently running 150/90 or above   *If you need a refill on your cardiac medications before your next appointment, please call your pharmacy*   Lab Work: None today  If you have labs (blood work) drawn today and your tests are completely normal, you will receive your results only by: [place] (if you have MyChart) OR A paper copy in the mail If you have any lab test that is abnormal or we need to change your treatment, we will call you to review the results.   Testing/Procedures:   None today    Follow-Up: At [facility], you and your health needs are our priority.  As part of our continuing mission to provide you with exceptional heart care, we have created designated Provider Care Teams.  These Care Teams include your primary Cardiologist (physician) and Advanced Practice Providers (APPs -  Physician Assistants and Nurse Practitioners) who all work together to provide you with the care you need, when you need it.  We recommend signing up for the patient portal called "MyChart".  Sign up information is provided on this After Visit Summary.  MyChart is used to connect with patients for Virtual Visits (Telemedicine).  Patients are able to view lab/test results, encounter notes, upcoming appointments, etc.  Non-urgent messages can be sent to your provider as well.   To learn more about what you can do with MyChart, go to [email or web address].    Your next appointment:  Keep 12/27/21 apt with Dr.McDowell at 1020 am

## 2021-08-12 DIAGNOSIS — R42 Dizziness and giddiness: Secondary | ICD-10-CM | POA: Diagnosis not present

## 2021-08-12 DIAGNOSIS — I4891 Unspecified atrial fibrillation: Secondary | ICD-10-CM | POA: Diagnosis not present

## 2021-08-12 DIAGNOSIS — I10 Essential (primary) hypertension: Secondary | ICD-10-CM | POA: Diagnosis not present

## 2021-08-12 DIAGNOSIS — E1165 Type 2 diabetes mellitus with hyperglycemia: Secondary | ICD-10-CM | POA: Diagnosis not present

## 2021-08-12 DIAGNOSIS — N3090 Cystitis, unspecified without hematuria: Secondary | ICD-10-CM | POA: Diagnosis not present

## 2021-08-12 DIAGNOSIS — Z6829 Body mass index (BMI) 29.0-29.9, adult: Secondary | ICD-10-CM | POA: Diagnosis not present

## 2021-08-15 DIAGNOSIS — Z7901 Long term (current) use of anticoagulants: Secondary | ICD-10-CM | POA: Diagnosis not present

## 2021-08-15 DIAGNOSIS — Z602 Problems related to living alone: Secondary | ICD-10-CM | POA: Diagnosis not present

## 2021-08-15 DIAGNOSIS — I4891 Unspecified atrial fibrillation: Secondary | ICD-10-CM | POA: Diagnosis not present

## 2021-08-15 DIAGNOSIS — R262 Difficulty in walking, not elsewhere classified: Secondary | ICD-10-CM | POA: Diagnosis not present

## 2021-08-15 DIAGNOSIS — E119 Type 2 diabetes mellitus without complications: Secondary | ICD-10-CM | POA: Diagnosis not present

## 2021-08-15 DIAGNOSIS — A419 Sepsis, unspecified organism: Secondary | ICD-10-CM | POA: Diagnosis not present

## 2021-08-15 DIAGNOSIS — R410 Disorientation, unspecified: Secondary | ICD-10-CM | POA: Diagnosis not present

## 2021-08-15 DIAGNOSIS — R0902 Hypoxemia: Secondary | ICD-10-CM | POA: Diagnosis not present

## 2021-08-15 DIAGNOSIS — R4182 Altered mental status, unspecified: Secondary | ICD-10-CM | POA: Diagnosis not present

## 2021-08-15 DIAGNOSIS — R42 Dizziness and giddiness: Secondary | ICD-10-CM | POA: Diagnosis not present

## 2021-08-15 DIAGNOSIS — Z79899 Other long term (current) drug therapy: Secondary | ICD-10-CM | POA: Diagnosis not present

## 2021-08-15 DIAGNOSIS — R531 Weakness: Secondary | ICD-10-CM | POA: Insufficient documentation

## 2021-08-15 DIAGNOSIS — G9341 Metabolic encephalopathy: Secondary | ICD-10-CM | POA: Diagnosis not present

## 2021-08-15 DIAGNOSIS — N390 Urinary tract infection, site not specified: Secondary | ICD-10-CM | POA: Diagnosis not present

## 2021-08-15 DIAGNOSIS — Z9181 History of falling: Secondary | ICD-10-CM | POA: Diagnosis not present

## 2021-08-15 DIAGNOSIS — R509 Fever, unspecified: Secondary | ICD-10-CM | POA: Diagnosis not present

## 2021-08-15 DIAGNOSIS — Z96652 Presence of left artificial knee joint: Secondary | ICD-10-CM | POA: Diagnosis not present

## 2021-08-15 DIAGNOSIS — I10 Essential (primary) hypertension: Secondary | ICD-10-CM | POA: Diagnosis not present

## 2021-08-15 DIAGNOSIS — K219 Gastro-esophageal reflux disease without esophagitis: Secondary | ICD-10-CM | POA: Diagnosis not present

## 2021-08-15 DIAGNOSIS — Z20822 Contact with and (suspected) exposure to covid-19: Secondary | ICD-10-CM | POA: Diagnosis not present

## 2021-08-15 DIAGNOSIS — Z7409 Other reduced mobility: Secondary | ICD-10-CM | POA: Diagnosis not present

## 2021-08-15 DIAGNOSIS — R404 Transient alteration of awareness: Secondary | ICD-10-CM | POA: Diagnosis not present

## 2021-08-15 DIAGNOSIS — Z7984 Long term (current) use of oral hypoglycemic drugs: Secondary | ICD-10-CM | POA: Diagnosis not present

## 2021-08-15 DIAGNOSIS — R2689 Other abnormalities of gait and mobility: Secondary | ICD-10-CM | POA: Diagnosis not present

## 2021-08-16 DIAGNOSIS — R531 Weakness: Secondary | ICD-10-CM | POA: Diagnosis not present

## 2021-08-16 DIAGNOSIS — W19XXXD Unspecified fall, subsequent encounter: Secondary | ICD-10-CM | POA: Diagnosis not present

## 2021-08-16 DIAGNOSIS — I4891 Unspecified atrial fibrillation: Secondary | ICD-10-CM | POA: Diagnosis not present

## 2021-08-16 DIAGNOSIS — R509 Fever, unspecified: Secondary | ICD-10-CM | POA: Diagnosis not present

## 2021-08-16 DIAGNOSIS — Z7901 Long term (current) use of anticoagulants: Secondary | ICD-10-CM | POA: Diagnosis not present

## 2021-08-16 DIAGNOSIS — R42 Dizziness and giddiness: Secondary | ICD-10-CM | POA: Diagnosis not present

## 2021-08-16 DIAGNOSIS — Z87440 Personal history of urinary (tract) infections: Secondary | ICD-10-CM | POA: Diagnosis not present

## 2021-08-16 DIAGNOSIS — G9341 Metabolic encephalopathy: Secondary | ICD-10-CM | POA: Diagnosis not present

## 2021-08-16 DIAGNOSIS — I10 Essential (primary) hypertension: Secondary | ICD-10-CM | POA: Diagnosis not present

## 2021-08-16 DIAGNOSIS — R410 Disorientation, unspecified: Secondary | ICD-10-CM | POA: Diagnosis not present

## 2021-08-18 DIAGNOSIS — E119 Type 2 diabetes mellitus without complications: Secondary | ICD-10-CM | POA: Diagnosis not present

## 2021-08-21 DIAGNOSIS — N183 Chronic kidney disease, stage 3 unspecified: Secondary | ICD-10-CM | POA: Diagnosis not present

## 2021-08-21 DIAGNOSIS — I10 Essential (primary) hypertension: Secondary | ICD-10-CM | POA: Diagnosis not present

## 2021-08-21 DIAGNOSIS — E871 Hypo-osmolality and hyponatremia: Secondary | ICD-10-CM | POA: Diagnosis not present

## 2021-08-21 DIAGNOSIS — R509 Fever, unspecified: Secondary | ICD-10-CM | POA: Diagnosis not present

## 2021-08-21 DIAGNOSIS — E1122 Type 2 diabetes mellitus with diabetic chronic kidney disease: Secondary | ICD-10-CM | POA: Diagnosis not present

## 2021-08-21 DIAGNOSIS — I4891 Unspecified atrial fibrillation: Secondary | ICD-10-CM | POA: Diagnosis not present

## 2021-08-21 DIAGNOSIS — R4189 Other symptoms and signs involving cognitive functions and awareness: Secondary | ICD-10-CM | POA: Diagnosis not present

## 2021-08-21 DIAGNOSIS — I482 Chronic atrial fibrillation, unspecified: Secondary | ICD-10-CM | POA: Diagnosis not present

## 2021-08-21 DIAGNOSIS — Z6828 Body mass index (BMI) 28.0-28.9, adult: Secondary | ICD-10-CM | POA: Diagnosis not present

## 2021-08-21 DIAGNOSIS — D649 Anemia, unspecified: Secondary | ICD-10-CM | POA: Diagnosis not present

## 2021-08-21 DIAGNOSIS — E876 Hypokalemia: Secondary | ICD-10-CM | POA: Diagnosis not present

## 2021-08-22 DIAGNOSIS — R262 Difficulty in walking, not elsewhere classified: Secondary | ICD-10-CM | POA: Diagnosis not present

## 2021-08-22 DIAGNOSIS — M6281 Muscle weakness (generalized): Secondary | ICD-10-CM | POA: Diagnosis not present

## 2021-08-24 DIAGNOSIS — M6281 Muscle weakness (generalized): Secondary | ICD-10-CM | POA: Diagnosis not present

## 2021-08-24 DIAGNOSIS — R262 Difficulty in walking, not elsewhere classified: Secondary | ICD-10-CM | POA: Diagnosis not present

## 2021-08-29 DIAGNOSIS — M6281 Muscle weakness (generalized): Secondary | ICD-10-CM | POA: Diagnosis not present

## 2021-08-29 DIAGNOSIS — R262 Difficulty in walking, not elsewhere classified: Secondary | ICD-10-CM | POA: Diagnosis not present

## 2021-09-05 DIAGNOSIS — M6281 Muscle weakness (generalized): Secondary | ICD-10-CM | POA: Diagnosis not present

## 2021-09-05 DIAGNOSIS — R262 Difficulty in walking, not elsewhere classified: Secondary | ICD-10-CM | POA: Diagnosis not present

## 2021-09-07 DIAGNOSIS — R262 Difficulty in walking, not elsewhere classified: Secondary | ICD-10-CM | POA: Diagnosis not present

## 2021-09-07 DIAGNOSIS — M6281 Muscle weakness (generalized): Secondary | ICD-10-CM | POA: Diagnosis not present

## 2021-09-15 DIAGNOSIS — I4891 Unspecified atrial fibrillation: Secondary | ICD-10-CM | POA: Diagnosis not present

## 2021-09-15 DIAGNOSIS — R509 Fever, unspecified: Secondary | ICD-10-CM | POA: Diagnosis not present

## 2021-09-15 DIAGNOSIS — R4189 Other symptoms and signs involving cognitive functions and awareness: Secondary | ICD-10-CM | POA: Diagnosis not present

## 2021-09-15 DIAGNOSIS — D649 Anemia, unspecified: Secondary | ICD-10-CM | POA: Diagnosis not present

## 2021-09-15 DIAGNOSIS — E1122 Type 2 diabetes mellitus with diabetic chronic kidney disease: Secondary | ICD-10-CM | POA: Diagnosis not present

## 2021-09-15 DIAGNOSIS — I10 Essential (primary) hypertension: Secondary | ICD-10-CM | POA: Diagnosis not present

## 2021-09-15 DIAGNOSIS — E871 Hypo-osmolality and hyponatremia: Secondary | ICD-10-CM | POA: Diagnosis not present

## 2021-09-15 DIAGNOSIS — E876 Hypokalemia: Secondary | ICD-10-CM | POA: Diagnosis not present

## 2021-09-18 DIAGNOSIS — E1122 Type 2 diabetes mellitus with diabetic chronic kidney disease: Secondary | ICD-10-CM | POA: Diagnosis not present

## 2021-09-18 DIAGNOSIS — I129 Hypertensive chronic kidney disease with stage 1 through stage 4 chronic kidney disease, or unspecified chronic kidney disease: Secondary | ICD-10-CM | POA: Diagnosis not present

## 2021-09-18 DIAGNOSIS — E875 Hyperkalemia: Secondary | ICD-10-CM | POA: Diagnosis not present

## 2021-09-18 DIAGNOSIS — E119 Type 2 diabetes mellitus without complications: Secondary | ICD-10-CM | POA: Diagnosis not present

## 2021-09-18 DIAGNOSIS — N183 Chronic kidney disease, stage 3 unspecified: Secondary | ICD-10-CM | POA: Diagnosis not present

## 2021-09-28 DIAGNOSIS — Z20822 Contact with and (suspected) exposure to covid-19: Secondary | ICD-10-CM | POA: Diagnosis not present

## 2021-10-11 ENCOUNTER — Ambulatory Visit: Payer: Medicare Other | Admitting: Cardiology

## 2021-11-15 ENCOUNTER — Telehealth: Payer: Self-pay | Admitting: Cardiology

## 2021-11-15 NOTE — Telephone Encounter (Signed)
Noted, will discuss with nurse who handles patient assistance when they return to work tomorrow.

## 2021-11-15 NOTE — Telephone Encounter (Signed)
Paperwork for the eliquis has been drop off, asking that our office filled out and give them call once it filled out. Please advise

## 2021-11-17 DIAGNOSIS — E1122 Type 2 diabetes mellitus with diabetic chronic kidney disease: Secondary | ICD-10-CM | POA: Diagnosis not present

## 2021-11-17 DIAGNOSIS — I129 Hypertensive chronic kidney disease with stage 1 through stage 4 chronic kidney disease, or unspecified chronic kidney disease: Secondary | ICD-10-CM | POA: Diagnosis not present

## 2021-11-17 DIAGNOSIS — N183 Chronic kidney disease, stage 3 unspecified: Secondary | ICD-10-CM | POA: Diagnosis not present

## 2021-11-17 DIAGNOSIS — E875 Hyperkalemia: Secondary | ICD-10-CM | POA: Diagnosis not present

## 2021-11-17 NOTE — Telephone Encounter (Signed)
Pt notified that patient assistance forms to be signed and faxed today.

## 2021-11-27 DIAGNOSIS — E7801 Familial hypercholesterolemia: Secondary | ICD-10-CM | POA: Diagnosis not present

## 2021-11-27 DIAGNOSIS — I10 Essential (primary) hypertension: Secondary | ICD-10-CM | POA: Diagnosis not present

## 2021-11-27 DIAGNOSIS — E871 Hypo-osmolality and hyponatremia: Secondary | ICD-10-CM | POA: Diagnosis not present

## 2021-11-27 DIAGNOSIS — E1122 Type 2 diabetes mellitus with diabetic chronic kidney disease: Secondary | ICD-10-CM | POA: Diagnosis not present

## 2021-11-27 DIAGNOSIS — N183 Chronic kidney disease, stage 3 unspecified: Secondary | ICD-10-CM | POA: Diagnosis not present

## 2021-11-27 DIAGNOSIS — E7800 Pure hypercholesterolemia, unspecified: Secondary | ICD-10-CM | POA: Diagnosis not present

## 2021-11-27 DIAGNOSIS — E875 Hyperkalemia: Secondary | ICD-10-CM | POA: Diagnosis not present

## 2021-11-30 DIAGNOSIS — R634 Abnormal weight loss: Secondary | ICD-10-CM | POA: Diagnosis not present

## 2021-11-30 DIAGNOSIS — R0989 Other specified symptoms and signs involving the circulatory and respiratory systems: Secondary | ICD-10-CM | POA: Diagnosis not present

## 2021-11-30 DIAGNOSIS — M545 Low back pain, unspecified: Secondary | ICD-10-CM | POA: Diagnosis not present

## 2021-11-30 DIAGNOSIS — E1140 Type 2 diabetes mellitus with diabetic neuropathy, unspecified: Secondary | ICD-10-CM | POA: Diagnosis not present

## 2021-11-30 DIAGNOSIS — E1165 Type 2 diabetes mellitus with hyperglycemia: Secondary | ICD-10-CM | POA: Diagnosis not present

## 2021-11-30 DIAGNOSIS — N390 Urinary tract infection, site not specified: Secondary | ICD-10-CM | POA: Diagnosis not present

## 2021-11-30 DIAGNOSIS — R809 Proteinuria, unspecified: Secondary | ICD-10-CM | POA: Diagnosis not present

## 2021-11-30 DIAGNOSIS — I10 Essential (primary) hypertension: Secondary | ICD-10-CM | POA: Diagnosis not present

## 2021-11-30 DIAGNOSIS — I482 Chronic atrial fibrillation, unspecified: Secondary | ICD-10-CM | POA: Diagnosis not present

## 2021-11-30 DIAGNOSIS — E7800 Pure hypercholesterolemia, unspecified: Secondary | ICD-10-CM | POA: Diagnosis not present

## 2021-11-30 DIAGNOSIS — N183 Chronic kidney disease, stage 3 unspecified: Secondary | ICD-10-CM | POA: Diagnosis not present

## 2021-11-30 DIAGNOSIS — E1121 Type 2 diabetes mellitus with diabetic nephropathy: Secondary | ICD-10-CM | POA: Diagnosis not present

## 2021-12-01 DIAGNOSIS — I10 Essential (primary) hypertension: Secondary | ICD-10-CM | POA: Diagnosis not present

## 2021-12-01 DIAGNOSIS — R0902 Hypoxemia: Secondary | ICD-10-CM | POA: Diagnosis not present

## 2021-12-01 DIAGNOSIS — I4891 Unspecified atrial fibrillation: Secondary | ICD-10-CM | POA: Diagnosis not present

## 2021-12-01 DIAGNOSIS — Z20822 Contact with and (suspected) exposure to covid-19: Secondary | ICD-10-CM | POA: Diagnosis not present

## 2021-12-01 DIAGNOSIS — R197 Diarrhea, unspecified: Secondary | ICD-10-CM | POA: Diagnosis not present

## 2021-12-01 DIAGNOSIS — R0689 Other abnormalities of breathing: Secondary | ICD-10-CM | POA: Diagnosis not present

## 2021-12-01 DIAGNOSIS — B9620 Unspecified Escherichia coli [E. coli] as the cause of diseases classified elsewhere: Secondary | ICD-10-CM | POA: Diagnosis not present

## 2021-12-01 DIAGNOSIS — A419 Sepsis, unspecified organism: Secondary | ICD-10-CM | POA: Diagnosis not present

## 2021-12-01 DIAGNOSIS — Z452 Encounter for adjustment and management of vascular access device: Secondary | ICD-10-CM | POA: Diagnosis not present

## 2021-12-01 DIAGNOSIS — G9340 Encephalopathy, unspecified: Secondary | ICD-10-CM | POA: Diagnosis not present

## 2021-12-01 DIAGNOSIS — Z66 Do not resuscitate: Secondary | ICD-10-CM | POA: Diagnosis not present

## 2021-12-01 DIAGNOSIS — E872 Acidosis, unspecified: Secondary | ICD-10-CM | POA: Diagnosis not present

## 2021-12-01 DIAGNOSIS — R059 Cough, unspecified: Secondary | ICD-10-CM | POA: Diagnosis not present

## 2021-12-01 DIAGNOSIS — E119 Type 2 diabetes mellitus without complications: Secondary | ICD-10-CM | POA: Diagnosis not present

## 2021-12-01 DIAGNOSIS — B961 Klebsiella pneumoniae [K. pneumoniae] as the cause of diseases classified elsewhere: Secondary | ICD-10-CM | POA: Diagnosis not present

## 2021-12-01 DIAGNOSIS — Z7984 Long term (current) use of oral hypoglycemic drugs: Secondary | ICD-10-CM | POA: Diagnosis not present

## 2021-12-01 DIAGNOSIS — Z7901 Long term (current) use of anticoagulants: Secondary | ICD-10-CM | POA: Diagnosis not present

## 2021-12-01 DIAGNOSIS — I700 Atherosclerosis of aorta: Secondary | ICD-10-CM | POA: Diagnosis not present

## 2021-12-01 DIAGNOSIS — J9811 Atelectasis: Secondary | ICD-10-CM | POA: Diagnosis not present

## 2021-12-01 DIAGNOSIS — I499 Cardiac arrhythmia, unspecified: Secondary | ICD-10-CM | POA: Diagnosis not present

## 2021-12-01 DIAGNOSIS — R4182 Altered mental status, unspecified: Secondary | ICD-10-CM | POA: Diagnosis not present

## 2021-12-01 DIAGNOSIS — R112 Nausea with vomiting, unspecified: Secondary | ICD-10-CM | POA: Diagnosis not present

## 2021-12-01 DIAGNOSIS — E860 Dehydration: Secondary | ICD-10-CM | POA: Diagnosis not present

## 2021-12-01 DIAGNOSIS — N3000 Acute cystitis without hematuria: Secondary | ICD-10-CM | POA: Diagnosis present

## 2021-12-01 DIAGNOSIS — Z79899 Other long term (current) drug therapy: Secondary | ICD-10-CM | POA: Diagnosis not present

## 2021-12-01 DIAGNOSIS — R531 Weakness: Secondary | ICD-10-CM | POA: Diagnosis not present

## 2021-12-02 ENCOUNTER — Other Ambulatory Visit: Payer: Self-pay | Admitting: Cardiology

## 2021-12-04 NOTE — Telephone Encounter (Signed)
Prescription refill request for Eliquis received. Indication: Atrial Fib Last office visit: 08/11/21  B Strader PA-C Scr: 0.61 on 12/04/21 Age: 88 Weight: 62.6kg  Based on above findings Eliquis 5mg  twice daily is the appropriate dose.  Refill approved.

## 2021-12-06 NOTE — Progress Notes (Signed)
Cardiology Office Note    Date:  12/07/2021   ID:  [name], DOB 25-Dec-1926, MRN [id number]  PCP:  Manon Hilding, MD  Cardiologist: Rozann Lesches, MD    Chief Complaint  Patient presents with   Hospitalization Follow-up    History of Present Illness:    [name] is a 88 y.o. female with past medical history of permanent atrial fibrillation (on Eliquis), pSVT, HLD and prior TIA who presents to the office today for hospital follow-up.  She was last examined by myself in 07/2021 following a recent hospitalization for near syncope and a fall which was felt to be secondary to orthostatic hypotension in the setting of a UTI.  At the time of follow-up, she reported still having some weakness but symptoms were starting to improve and she had increased her water intake. Given her recent orthostasis, Losartan 12.5 mg daily was discontinued and she was continued on Eliquis 5 mg twice daily along with Atenolol 50 mg twice daily.  By review of Care Everywhere, she was admitted to [facility] from 1/13 - 12/04/2021 for altered mental status and was found to have Urosepsis (lactic acid at 4.7 on admission).  By review of the discharge summary, she did have atrial fibrillation with RVR during admission and received 2 doses of Amiodarone along with being started on IV Cardizem drip but was discharged on her PTA medication regimen as her heart rate improved throughout admission.  In talking with the patient and her daughter today, she reports overall doing well since returning home. The patient's daughter and son live close by and check on her multiple times a day. She was overall unaware of her elevated rates during the hospital but states she did feel "jittery" at times. She has been checking her vitals at home and they have been well controlled. She denies any recent chest pain, palpitations, dyspnea on exertion, orthopnea, PND or pitting edema. She is still on antibiotics for her recent UTI  and is scheduled to finish these later in the week.   Past Medical History:  Diagnosis Date   Atrial fibrillation ([place])    Coronary atherosclerosis of native coronary artery    Nonobstructive   Dyslipidemia    PSVT (paroxysmal supraventricular tachycardia) (HCC)    TIA (transient ischemic attack)    Type 2 diabetes mellitus ([place])    Diet controlled    Past Surgical History:  Procedure Laterality Date   ABDOMINAL HYSTERECTOMY     CARPAL TUNNEL RELEASE     x's 2   CHOLECYSTECTOMY     REPLACEMENT TOTAL KNEE  2002 & 2012   SHOULDER SURGERY  2001    Current Medications: Outpatient Medications Prior to Visit  Medication Sig Dispense Refill   acetaminophen (TYLENOL) 500 MG tablet Take 1,000 mg by mouth 2 (two) times daily.     atenolol (TENORMIN) 50 MG tablet TAKE 1 TABLET BY MOUTH TWICE A DAY 180 tablet 3   cefUROXime (CEFTIN) 500 MG tablet Take 500 mg by mouth 2 (two) times daily.     ELIQUIS 5 MG TABS tablet TAKE 1 TABLET BY MOUTH TWICE A DAY 60 tablet 5   meclizine (ANTIVERT) 25 MG tablet Take 1 tablet by mouth 3 (three) times daily as needed.     metFORMIN (GLUCOPHAGE-XR) 500 MG 24 hr tablet Take 2 tabs (1,000mg ) by mouth every morning & 1 tab (500mg ) every evening     Multiple Vitamin (MULTIVITAMIN) tablet Take 1 tablet by mouth  daily.     multivitamin-lutein (OCUVITE-LUTEIN) CAPS capsule Take 1 capsule by mouth daily.     nitroGLYCERIN (NITROSTAT) 0.4 MG SL tablet Place 1 tablet (0.4 mg total) under the tongue every 5 (five) minutes x 3 doses as needed for chest pain (if no relief after 3rd dose, proceed to the ED for an evaluation or call 911). 25 tablet 3   simvastatin (ZOCOR) 10 MG tablet Take 10 mg by mouth at bedtime.     Calcium Carbonate-Vitamin D 600-400 MG-UNIT tablet Take 1 tablet by mouth daily. (Patient not taking: Reported on 12/07/2021)     docusate sodium (COLACE) 100 MG capsule Take 100 mg by mouth daily as needed.  (Patient not taking: Reported on 12/07/2021)      No facility-administered medications prior to visit.     Allergies:   Toprol xl [metoprolol succinate]   Social History   Socioeconomic History   Marital status: Widowed    Spouse name: Not on file   Number of children: Not on file   Years of education: Not on file   Highest education level: Not on file  Occupational History   Not on file  Tobacco Use   Smoking status: Never   Smokeless tobacco: Never  Vaping Use   Vaping Use: Never used  Substance and Sexual Activity   Alcohol use: No    Alcohol/week: 0.0 standard drinks   Drug use: No   Sexual activity: Not on file  Other Topics Concern   Not on file  Social History Narrative   Not on file   Social Determinants of Health   Financial Resource Strain: Not on file  Food Insecurity: Not on file  Transportation Needs: Not on file  Physical Activity: Not on file  Stress: Not on file  Social Connections: Not on file     Family History:  The patient's family history includes Coronary artery disease in her mother.   Review of Systems:    Please see the history of present illness.     All other systems reviewed and are otherwise negative except as noted above.   Physical Exam:    VS:  BP 132/74    Pulse 87    Ht 5' (1.524 m)    Wt 138 lb 3.2 oz (62.7 kg)    SpO2 99%    BMI 26.99 kg/m    General: Elderly female appearing in no acute distress. Head: Normocephalic, atraumatic. Neck: No carotid bruits. JVD not elevated.  Lungs: Respirations regular and unlabored, without wheezes or rales.  Heart: Irregularly irregular. No S3 or S4.  No murmur, no rubs, or gallops appreciated. Abdomen: Appears non-distended. No obvious abdominal masses. Msk:  Strength and tone appear normal for age. No obvious joint deformities or effusions. Extremities: No clubbing or cyanosis. No pitting edema.  Distal pedal pulses are 2+ bilaterally. Neuro: Alert and oriented X 3. Moves all extremities spontaneously. No focal deficits  noted. Psych:  Responds to questions appropriately with a normal affect. Skin: No rashes or lesions noted  Wt Readings from Last 3 Encounters:  12/07/21 138 lb 3.2 oz (62.7 kg)  08/11/21 138 lb (62.6 kg)  07/17/21 138 lb (62.6 kg)     Studies/Labs Reviewed:   EKG:  EKG is not ordered today.    Recent Labs: 07/17/2021: BUN 21; Creatinine, Ser 0.80; Hemoglobin 11.7; Platelets 291; Potassium 5.0; Sodium 134   Lipid Panel No results found for: CHOL, TRIG, HDL, CHOLHDL, VLDL, LDLCALC, LDLDIRECT  Additional studies/  records that were reviewed today include:   CXR: 12/01/2021 Unchanged diffuse interstitial opacities, could be due to mild edema  or chronic interstitial lung disease. No new airspace disease.   Unchanged elevated right hemidiaphragm with right basilar  atelectasis.   Assessment:    1. Permanent atrial fibrillation ([place])   2. Orthostatic hypotension   3. Mixed hyperlipidemia      Plan:   In order of problems listed above:  1. Permanent Atrial Fibrillation - She did have elevated rates during her recent admission for Urosepsis but this was likely triggered by her acute illness at that time. Her heart rate has been well controlled at home since and is in the 70's to 80's during her visit today. Will continue Atenolol 50 mg twice daily for rate control. - She denies any evidence of active bleeding. Remains on Eliquis 5 mg twice daily for anticoagulation which is the appropriate dose at this time given her age, weight and kidney function. Her weight is borderline, therefore will have to follow this closely.  2. Orthostatic Hypotension - This was diagnosed during a prior ED evaluation and and we discontinued Losartan at her last visit. She denies any recurrent dizziness since. Continue current medication regimen for now.  3. HLD - Routine labs are followed by her PCP and she remains on Simvastatin 10 mg daily.   Medication Adjustments/Labs and Tests  Ordered: Current medicines are reviewed at length with the patient today.  Concerns regarding medicines are outlined above.  Medication changes, Labs and Tests ordered today are listed in the Patient Instructions below. Patient Instructions  Medication Instructions:  Your physician recommends that you continue on your current medications as directed. Please refer to the Current Medication list given to you today.  *If you need a refill on your cardiac medications before your next appointment, please call your pharmacy*   Lab Work: NONE  If you have labs (blood work) drawn today and your tests are completely normal, you will receive your results only by: [place] (if you have MyChart) OR A paper copy in the mail If you have any lab test that is abnormal or we need to change your treatment, we will call you to review the results.   Testing/Procedures: NONE    Follow-Up: At [facility], you and your health needs are our priority.  As part of our continuing mission to provide you with exceptional heart care, we have created designated Provider Care Teams.  These Care Teams include your primary Cardiologist (physician) and Advanced Practice Providers (APPs -  Physician Assistants and Nurse Practitioners) who all work together to provide you with the care you need, when you need it.  We recommend signing up for the patient portal called "MyChart".  Sign up information is provided on this After Visit Summary.  MyChart is used to connect with patients for Virtual Visits (Telemedicine).  Patients are able to view lab/test results, encounter notes, upcoming appointments, etc.  Non-urgent messages can be sent to your provider as well.   To learn more about what you can do with MyChart, go to [email or web address].    Your next appointment:   3-4  month(s)  The format for your next appointment:   In Person  Provider:   Rozann Lesches, MD or Bernerd Pho, PA-C    Other  Instructions Thank you for choosing [facility]!       Signed, Erma Heritage, PA-C  12/07/2021 5:04 PM    [facility] [address] [place], [place] [address] Phone: [phone] Fax: [phone]

## 2021-12-07 ENCOUNTER — Ambulatory Visit (INDEPENDENT_AMBULATORY_CARE_PROVIDER_SITE_OTHER): Payer: Medicare Other | Admitting: Student

## 2021-12-07 ENCOUNTER — Other Ambulatory Visit: Payer: Self-pay

## 2021-12-07 ENCOUNTER — Encounter (INDEPENDENT_AMBULATORY_CARE_PROVIDER_SITE_OTHER): Payer: Self-pay

## 2021-12-07 ENCOUNTER — Encounter: Payer: Self-pay | Admitting: Student

## 2021-12-07 VITALS — BP 132/74 | HR 87 | Ht 60.0 in | Wt 138.2 lb

## 2021-12-07 DIAGNOSIS — I951 Orthostatic hypotension: Secondary | ICD-10-CM

## 2021-12-07 DIAGNOSIS — I4821 Permanent atrial fibrillation: Secondary | ICD-10-CM | POA: Diagnosis not present

## 2021-12-07 DIAGNOSIS — E782 Mixed hyperlipidemia: Secondary | ICD-10-CM | POA: Diagnosis not present

## 2021-12-07 NOTE — Patient Instructions (Signed)
Medication Instructions:  Your physician recommends that you continue on your current medications as directed. Please refer to the Current Medication list given to you today.  *If you need a refill on your cardiac medications before your next appointment, please call your pharmacy*   Lab Work: NONE  If you have labs (blood work) drawn today and your tests are completely normal, you will receive your results only by: [place] (if you have MyChart) OR A paper copy in the mail If you have any lab test that is abnormal or we need to change your treatment, we will call you to review the results.   Testing/Procedures: NONE    Follow-Up: At [facility], you and your health needs are our priority.  As part of our continuing mission to provide you with exceptional heart care, we have created designated Provider Care Teams.  These Care Teams include your primary Cardiologist (physician) and Advanced Practice Providers (APPs -  Physician Assistants and Nurse Practitioners) who all work together to provide you with the care you need, when you need it.  We recommend signing up for the patient portal called "MyChart".  Sign up information is provided on this After Visit Summary.  MyChart is used to connect with patients for Virtual Visits (Telemedicine).  Patients are able to view lab/test results, encounter notes, upcoming appointments, etc.  Non-urgent messages can be sent to your provider as well.   To learn more about what you can do with MyChart, go to [email or web address].    Your next appointment:   3-4  month(s)  The format for your next appointment:   In Person  Provider:   Rozann Lesches, MD or Bernerd Pho, PA-C    Other Instructions Thank you for choosing [facility]!

## 2021-12-15 DIAGNOSIS — Z20822 Contact with and (suspected) exposure to covid-19: Secondary | ICD-10-CM | POA: Diagnosis not present

## 2021-12-17 DIAGNOSIS — I129 Hypertensive chronic kidney disease with stage 1 through stage 4 chronic kidney disease, or unspecified chronic kidney disease: Secondary | ICD-10-CM | POA: Diagnosis not present

## 2021-12-17 DIAGNOSIS — E1122 Type 2 diabetes mellitus with diabetic chronic kidney disease: Secondary | ICD-10-CM | POA: Diagnosis not present

## 2021-12-27 ENCOUNTER — Ambulatory Visit: Payer: Medicare Other | Admitting: Cardiology

## 2022-01-01 ENCOUNTER — Telehealth: Payer: Self-pay | Admitting: Cardiology

## 2022-01-01 NOTE — Telephone Encounter (Signed)
Spoke with [place] at [facility] and she states that they did receive the out of pocket receipt.

## 2022-01-01 NOTE — Telephone Encounter (Signed)
Pt c/o medication issue:  1. Name of Medication:  ELIQUIS 5 MG TABS tablet  2. How are you currently taking this medication (dosage and times per day)?   3. Are you having a reaction (difficulty breathing--STAT)?   4. What is your medication issue?   Patient's daughter is following up regarding patient assistance. She states during 1/19 appointment patient was told she would need to meet her $155 deductible prior to being eligible for assistance. She states the patient met her deductible and she would like to move forward with the application. Please assist.

## 2022-01-01 NOTE — Telephone Encounter (Signed)
Spoke with daughter who states that she mailed receipt to [employer] stating that pt has met her out of pocket for this year. She would like to know if they received it. Will call [employer] to ensure that they did receive it.

## 2022-01-01 NOTE — Telephone Encounter (Signed)
Daughter notified that application will now be sent for review.

## 2022-01-03 ENCOUNTER — Telehealth: Payer: Self-pay | Admitting: *Deleted

## 2022-01-03 DIAGNOSIS — N390 Urinary tract infection, site not specified: Secondary | ICD-10-CM | POA: Diagnosis not present

## 2022-01-03 DIAGNOSIS — E1122 Type 2 diabetes mellitus with diabetic chronic kidney disease: Secondary | ICD-10-CM | POA: Diagnosis not present

## 2022-01-03 DIAGNOSIS — Z6828 Body mass index (BMI) 28.0-28.9, adult: Secondary | ICD-10-CM | POA: Diagnosis not present

## 2022-01-03 DIAGNOSIS — R4189 Other symptoms and signs involving cognitive functions and awareness: Secondary | ICD-10-CM | POA: Diagnosis not present

## 2022-01-03 DIAGNOSIS — I4891 Unspecified atrial fibrillation: Secondary | ICD-10-CM | POA: Diagnosis not present

## 2022-01-03 NOTE — Telephone Encounter (Signed)
Pt and daughter notified that she has been approved for the [employer] ( Eliquis) patient assistance through 11/18/22.

## 2022-01-09 ENCOUNTER — Other Ambulatory Visit: Payer: Self-pay | Admitting: *Deleted

## 2022-01-09 MED ORDER — ELIQUIS 5 MG PO TABS: 5.0000 mg | ORAL_TABLET | Freq: Two times a day (BID) | ORAL | 5 refills | Status: DC

## 2022-01-09 NOTE — Telephone Encounter (Signed)
Prescription refill request for Eliquis received. Indication: Atrial fib Last office visit: 12/07/21  B Strader PA-C Scr: 0.61 on 12/04/21 Age: 88 Weight: 62.7kg  Based on above findings Eliquis 5mg  twice daily is the appropriate dose.  Refill approved.

## 2022-01-15 ENCOUNTER — Telehealth: Payer: Self-pay | Admitting: Cardiology

## 2022-01-15 NOTE — Telephone Encounter (Signed)
Called and notified daughter of [employer]'s msg.

## 2022-01-15 NOTE — Telephone Encounter (Signed)
Returned call to [employer]. Notified that the systems are currently down and they will call when they come back up. Will return call on tomorrow to [facility].

## 2022-01-15 NOTE — Telephone Encounter (Signed)
Pt c/o medication issue:  1. Name of Medication: ELIQUIS 5 MG TABS tablet  2. How are you currently taking this medication (dosage and times per day)? Take 1 tablet (5 mg total) by mouth 2 (two) times daily.  3. Are you having a reaction (difficulty breathing--STAT)? no  4. What is your medication issue? [name] trying to get in contact with our office in regards to her medication. Ask that [name] would call [employer] [phone] to confirm the dosage at 5mg 

## 2022-01-16 DIAGNOSIS — M545 Low back pain, unspecified: Secondary | ICD-10-CM | POA: Diagnosis not present

## 2022-01-16 DIAGNOSIS — Z6828 Body mass index (BMI) 28.0-28.9, adult: Secondary | ICD-10-CM | POA: Diagnosis not present

## 2022-01-16 NOTE — Telephone Encounter (Signed)
Spoke with [employer] who states that they will mail medication to pt.

## 2022-01-19 DIAGNOSIS — Z20822 Contact with and (suspected) exposure to covid-19: Secondary | ICD-10-CM | POA: Diagnosis not present

## 2022-01-26 DIAGNOSIS — J069 Acute upper respiratory infection, unspecified: Secondary | ICD-10-CM | POA: Diagnosis not present

## 2022-01-26 DIAGNOSIS — Z20828 Contact with and (suspected) exposure to other viral communicable diseases: Secondary | ICD-10-CM | POA: Diagnosis not present

## 2022-01-26 DIAGNOSIS — Z6828 Body mass index (BMI) 28.0-28.9, adult: Secondary | ICD-10-CM | POA: Diagnosis not present

## 2022-02-12 DIAGNOSIS — Z20822 Contact with and (suspected) exposure to covid-19: Secondary | ICD-10-CM | POA: Diagnosis not present

## 2022-02-14 DIAGNOSIS — Z20822 Contact with and (suspected) exposure to covid-19: Secondary | ICD-10-CM | POA: Diagnosis not present

## 2022-03-06 NOTE — Progress Notes (Signed)
? ?Cardiology Office Note   ? ?Date:  03/07/2022  ? ?ID:  [name], DOB Apr 12, 1927, MRN [id number] ? ?PCP:  Manon Hilding, MD  ?Cardiologist: Rozann Lesches, MD   ? ?Chief Complaint  ?Patient presents with  ? Follow-up  ?  3 month visit  ? ? ?History of Present Illness:   ? ?[name] is a 88 y.o. female with past medical history of permanent atrial fibrillation (on Eliquis), pSVT, HLD and prior TIA who presents to the office today for 68-month follow-up. ? ?She was last examined by myself in 11/2021 following a recent admission at [facility] for Urosepsis. She did have atrial fibrillation with RVR during admission but was discharged home on her PTA medication regimen. At the time of her follow-up, she reported her heart rate had been well controlled when checked at home and she denied any recent chest pain or palpitations. She was continued on Atenolol 50 mg twice daily for rate control along with Eliquis 5 mg twice daily for anticoagulation. ? ?In talking with the patient and her daughter today, she reports overall doing well since her last office visit.  She has increased her fluid consumption and now consumes at least 8 cups of water a day. She does report frequent urination with this and is not on a diuretic. She denies any recent chest pain or palpitations. No specific dyspnea on exertion, orthopnea or PND. She does experience intermittent lower extremity edema but uses her compression stockings on a daily basis and also elevates her lower extremities. ? ? ?Past Medical History:  ?Diagnosis Date  ? Atrial fibrillation ([place])   ? Coronary atherosclerosis of native coronary artery   ? Nonobstructive  ? Dyslipidemia   ? PSVT (paroxysmal supraventricular tachycardia) ([place])   ? TIA (transient ischemic attack)   ? Type 2 diabetes mellitus ([place])   ? Diet controlled  ? ? ?Past Surgical History:  ?Procedure Laterality Date  ? ABDOMINAL HYSTERECTOMY    ? CARPAL TUNNEL RELEASE    ? x's 2  ? CHOLECYSTECTOMY    ?  REPLACEMENT TOTAL KNEE  2002 & 2012  ? SHOULDER SURGERY  2001  ? ? ?Current Medications: ?Outpatient Medications Prior to Visit  ?Medication Sig Dispense Refill  ? acetaminophen (TYLENOL) 500 MG tablet Take 1,000 mg by mouth 2 (two) times daily.    ? atenolol (TENORMIN) 50 MG tablet TAKE 1 TABLET BY MOUTH TWICE A DAY 180 tablet 3  ? ELIQUIS 5 MG TABS tablet Take 1 tablet (5 mg total) by mouth 2 (two) times daily. 60 tablet 5  ? estradiol (ESTRACE) 0.1 MG/GM vaginal cream SMARTSIG:Gram(s) Vaginal 3 Times a Week    ? meloxicam (MOBIC) 7.5 MG tablet Take 7.5 mg by mouth daily.    ? metFORMIN (GLUCOPHAGE-XR) 500 MG 24 hr tablet Take 2 tabs (1,000mg ) by mouth every morning & 1 tab (500mg ) every evening    ? Multiple Vitamin (MULTIVITAMIN) tablet Take 1 tablet by mouth daily.    ? multivitamin-lutein (OCUVITE-LUTEIN) CAPS capsule Take 1 capsule by mouth daily.    ? simvastatin (ZOCOR) 10 MG tablet Take 10 mg by mouth at bedtime.    ? Calcium Carbonate-Vitamin D 600-400 MG-UNIT tablet Take 1 tablet by mouth daily. (Patient not taking: Reported on 12/07/2021)    ? cefUROXime (CEFTIN) 500 MG tablet Take 500 mg by mouth 2 (two) times daily. (Patient not taking: Reported on 03/07/2022)    ? docusate sodium (COLACE) 100 MG capsule Take  100 mg by mouth daily as needed.  (Patient not taking: Reported on 12/07/2021)    ? meclizine (ANTIVERT) 25 MG tablet Take 1 tablet by mouth 3 (three) times daily as needed. (Patient not taking: Reported on 03/07/2022)    ? nitroGLYCERIN (NITROSTAT) 0.4 MG SL tablet Place 1 tablet (0.4 mg total) under the tongue every 5 (five) minutes x 3 doses as needed for chest pain (if no relief after 3rd dose, proceed to the ED for an evaluation or call 911). (Patient not taking: Reported on 03/07/2022) 25 tablet 3  ? ?No facility-administered medications prior to visit.  ?  ? ?Allergies:   Toprol xl [metoprolol succinate]  ? ?Social History  ? ?Socioeconomic History  ? Marital status: Widowed  ?  Spouse name:  Not on file  ? Number of children: Not on file  ? Years of education: Not on file  ? Highest education level: Not on file  ?Occupational History  ? Not on file  ?Tobacco Use  ? Smoking status: Never  ? Smokeless tobacco: Never  ?Vaping Use  ? Vaping Use: Never used  ?Substance and Sexual Activity  ? Alcohol use: No  ?  Alcohol/week: 0.0 standard drinks  ? Drug use: No  ? Sexual activity: Not on file  ?Other Topics Concern  ? Not on file  ?Social History Narrative  ? Not on file  ? ?Social Determinants of Health  ? ?Financial Resource Strain: Not on file  ?Food Insecurity: Not on file  ?Transportation Needs: Not on file  ?Physical Activity: Not on file  ?Stress: Not on file  ?Social Connections: Not on file  ?  ? ?Family History:  The patient's family history includes Coronary artery disease in her mother.  ? ?Review of Systems:   ? ?Please see the history of present illness.    ? ?All other systems reviewed and are otherwise negative except as noted above. ? ? ?Physical Exam:   ? ?VS:  BP 136/82   Pulse 83   Ht 5\' 1"  (1.549 m)   Wt 137 lb 6.4 oz (62.3 kg)   SpO2 97%   BMI 25.96 kg/m?    ?General: Pleasant elderly female appearing in no acute distress. ?Head: Normocephalic, atraumatic. ?Neck: No carotid bruits. JVD not elevated.  ?Lungs: Respirations regular and unlabored, without wheezes or rales.  ?Heart: Irregularly irregular. No S3 or S4.  No murmur, no rubs, or gallops appreciated. ?Abdomen: Appears non-distended. No obvious abdominal masses. ?Msk:  Strength and tone appear normal for age. No obvious joint deformities or effusions. ?Extremities: No clubbing or cyanosis. Trace lower extremity edema.  Distal pedal pulses are 2+ bilaterally. Varicose veins present.  ?Neuro: Alert and oriented X 3. Moves all extremities spontaneously. No focal deficits noted. ?Psych:  Responds to questions appropriately with a normal affect. ?Skin: No rashes or lesions noted ? ?Wt Readings from Last 3 Encounters:  ?03/07/22 137  lb 6.4 oz (62.3 kg)  ?12/07/21 138 lb 3.2 oz (62.7 kg)  ?08/11/21 138 lb (62.6 kg)  ?  ? ?Studies/Labs Reviewed:  ? ?EKG:  EKG is not ordered today.  ? ?Recent Labs: ?07/17/2021: BUN 21; Creatinine, Ser 0.80; Hemoglobin 11.7; Platelets 291; Potassium 5.0; Sodium 134  ? ?Lipid Panel ?No results found for: CHOL, TRIG, HDL, CHOLHDL, VLDL, LDLCALC, LDLDIRECT ? ?Additional studies/ records that were reviewed today include:  ? ?CXR: 11/2021 ?FINDINGS:  ?Right-sided PICC line terminates at the expected location of the  ?cavoatrial junction/proximal right atrium.  ? ?  Cardiomediastinal silhouette is normal. Mediastinal contours appear  ?intact. Calcific atherosclerotic disease and tortuosity of the  ?aorta.  ? ?There is no evidence of focal airspace consolidation, pleural  ?effusion or pneumothorax. Chronic elevation of the right  ?hemidiaphragm.  ? ?Osseous structures are without acute abnormality. Soft tissues are  ?grossly normal.  ? ?IMPRESSION:  ?Right-sided PICC line terminates at the expected location of the  ?cavoatrial junction/proximal right atrium.   ? ?Assessment:   ? ?1. Permanent atrial fibrillation ([place])   ?2. Current use of long term anticoagulation   ?3. Orthostatic hypotension   ?4. Mixed hyperlipidemia   ? ? ? ?Plan:  ? ?In order of problems listed above: ? ?1. Permanent Atrial Fibrillation/Use of Long-Term Anticoagulation ?- She denies any recent palpitations and her heart rate has been well-controlled when checked at home and is in the 80's during today's visit. Continue Atenolol 50 mg twice daily for rate control. ?- No reports of active bleeding. She remains on Eliquis 5 mg twice daily for anticoagulation which is the appropriate dose at this time as her only indication for reduced dosing is age. Weight remains stable at 137 lbs and her creatinine was stable at 0.61 in 11/2021. ? ?2. Orthostatic Hypotension ?- Symptoms previously improved with discontinuing Losartan. Blood pressure remains stable at  136/82 and would continue with Atenolol alone at this time. ? ?3. HLD ?- Followed by her PCP. She remains on Simvastatin 10 mg daily. ? ? ?Medication Adjustments/Labs and Tests Ordered: ?Current medicines are

## 2022-03-07 ENCOUNTER — Encounter: Payer: Self-pay | Admitting: Student

## 2022-03-07 ENCOUNTER — Ambulatory Visit (INDEPENDENT_AMBULATORY_CARE_PROVIDER_SITE_OTHER): Payer: Medicare Other | Admitting: Student

## 2022-03-07 VITALS — BP 136/82 | HR 83 | Ht 61.0 in | Wt 137.4 lb

## 2022-03-07 DIAGNOSIS — Z7901 Long term (current) use of anticoagulants: Secondary | ICD-10-CM

## 2022-03-07 DIAGNOSIS — I951 Orthostatic hypotension: Secondary | ICD-10-CM

## 2022-03-07 DIAGNOSIS — I4821 Permanent atrial fibrillation: Secondary | ICD-10-CM | POA: Diagnosis not present

## 2022-03-07 DIAGNOSIS — E782 Mixed hyperlipidemia: Secondary | ICD-10-CM

## 2022-03-07 NOTE — Patient Instructions (Signed)
Medication Instructions:  Your physician recommends that you continue on your current medications as directed. Please refer to the Current Medication list given to you today.  *If you need a refill on your cardiac medications before your next appointment, please call your pharmacy*   Lab Work: NONE   If you have labs (blood work) drawn today and your tests are completely normal, you will receive your results only by: MyChart Message (if you have MyChart) OR A paper copy in the mail If you have any lab test that is abnormal or we need to change your treatment, we will call you to review the results.   Testing/Procedures: NONE    Follow-Up: At [facility], you and your health needs are our priority.  As part of our continuing mission to provide you with exceptional heart care, we have created designated Provider Care Teams.  These Care Teams include your primary Cardiologist (physician) and Advanced Practice Providers (APPs -  Physician Assistants and Nurse Practitioners) who all work together to provide you with the care you need, when you need it.  We recommend signing up for the patient portal called "MyChart".  Sign up information is provided on this After Visit Summary.  MyChart is used to connect with patients for Virtual Visits (Telemedicine).  Patients are able to view lab/test results, encounter notes, upcoming appointments, etc.  Non-urgent messages can be sent to your provider as well.   To learn more about what you can do with MyChart, go to [email or web address].    Your next appointment:   6 month(s)  The format for your next appointment:   In Person  Provider:   Samuel McDowell, MD    Other Instructions Thank you for choosing [facility]!    Important Information About Sugar       

## 2022-03-15 DIAGNOSIS — Z20822 Contact with and (suspected) exposure to covid-19: Secondary | ICD-10-CM | POA: Diagnosis not present

## 2022-03-17 DIAGNOSIS — Z20822 Contact with and (suspected) exposure to covid-19: Secondary | ICD-10-CM | POA: Diagnosis not present

## 2022-03-27 DIAGNOSIS — Z20822 Contact with and (suspected) exposure to covid-19: Secondary | ICD-10-CM | POA: Diagnosis not present

## 2022-03-28 DIAGNOSIS — E876 Hypokalemia: Secondary | ICD-10-CM | POA: Diagnosis not present

## 2022-03-28 DIAGNOSIS — D649 Anemia, unspecified: Secondary | ICD-10-CM | POA: Diagnosis not present

## 2022-03-28 DIAGNOSIS — E1121 Type 2 diabetes mellitus with diabetic nephropathy: Secondary | ICD-10-CM | POA: Diagnosis not present

## 2022-03-30 DIAGNOSIS — E1121 Type 2 diabetes mellitus with diabetic nephropathy: Secondary | ICD-10-CM | POA: Diagnosis not present

## 2022-03-30 DIAGNOSIS — R0989 Other specified symptoms and signs involving the circulatory and respiratory systems: Secondary | ICD-10-CM | POA: Diagnosis not present

## 2022-03-30 DIAGNOSIS — N183 Chronic kidney disease, stage 3 unspecified: Secondary | ICD-10-CM | POA: Diagnosis not present

## 2022-03-30 DIAGNOSIS — E7800 Pure hypercholesterolemia, unspecified: Secondary | ICD-10-CM | POA: Diagnosis not present

## 2022-03-30 DIAGNOSIS — Z0001 Encounter for general adult medical examination with abnormal findings: Secondary | ICD-10-CM | POA: Diagnosis not present

## 2022-03-30 DIAGNOSIS — Z6829 Body mass index (BMI) 29.0-29.9, adult: Secondary | ICD-10-CM | POA: Diagnosis not present

## 2022-03-30 DIAGNOSIS — Z23 Encounter for immunization: Secondary | ICD-10-CM | POA: Diagnosis not present

## 2022-03-30 DIAGNOSIS — I10 Essential (primary) hypertension: Secondary | ICD-10-CM | POA: Diagnosis not present

## 2022-03-30 DIAGNOSIS — E1165 Type 2 diabetes mellitus with hyperglycemia: Secondary | ICD-10-CM | POA: Diagnosis not present

## 2022-03-30 DIAGNOSIS — M545 Low back pain, unspecified: Secondary | ICD-10-CM | POA: Diagnosis not present

## 2022-03-30 DIAGNOSIS — R809 Proteinuria, unspecified: Secondary | ICD-10-CM | POA: Diagnosis not present

## 2022-03-30 DIAGNOSIS — E1140 Type 2 diabetes mellitus with diabetic neuropathy, unspecified: Secondary | ICD-10-CM | POA: Diagnosis not present

## 2022-03-30 DIAGNOSIS — I482 Chronic atrial fibrillation, unspecified: Secondary | ICD-10-CM | POA: Diagnosis not present

## 2022-05-01 ENCOUNTER — Emergency Department (HOSPITAL_COMMUNITY): Payer: Medicare Other

## 2022-05-01 ENCOUNTER — Other Ambulatory Visit: Payer: Self-pay

## 2022-05-01 ENCOUNTER — Inpatient Hospital Stay (HOSPITAL_COMMUNITY)
Admission: EM | Admit: 2022-05-01 | Discharge: 2022-05-04 | DRG: 689 | Disposition: A | Discharge status: Home Health | Payer: Medicare Other | Attending: Internal Medicine | Admitting: Internal Medicine

## 2022-05-01 ENCOUNTER — Encounter (HOSPITAL_COMMUNITY): Payer: Self-pay | Admitting: Emergency Medicine

## 2022-05-01 DIAGNOSIS — Z8249 Family history of ischemic heart disease and other diseases of the circulatory system: Secondary | ICD-10-CM

## 2022-05-01 DIAGNOSIS — E785 Hyperlipidemia, unspecified: Secondary | ICD-10-CM | POA: Diagnosis present

## 2022-05-01 DIAGNOSIS — D638 Anemia in other chronic diseases classified elsewhere: Secondary | ICD-10-CM | POA: Diagnosis not present

## 2022-05-01 DIAGNOSIS — G9341 Metabolic encephalopathy: Secondary | ICD-10-CM | POA: Diagnosis not present

## 2022-05-01 DIAGNOSIS — Z20822 Contact with and (suspected) exposure to covid-19: Secondary | ICD-10-CM | POA: Diagnosis present

## 2022-05-01 DIAGNOSIS — E538 Deficiency of other specified B group vitamins: Secondary | ICD-10-CM

## 2022-05-01 DIAGNOSIS — I2510 Atherosclerotic heart disease of native coronary artery without angina pectoris: Secondary | ICD-10-CM | POA: Diagnosis present

## 2022-05-01 DIAGNOSIS — E878 Other disorders of electrolyte and fluid balance, not elsewhere classified: Secondary | ICD-10-CM | POA: Diagnosis present

## 2022-05-01 DIAGNOSIS — R001 Bradycardia, unspecified: Secondary | ICD-10-CM | POA: Diagnosis present

## 2022-05-01 DIAGNOSIS — R410 Disorientation, unspecified: Secondary | ICD-10-CM | POA: Diagnosis not present

## 2022-05-01 DIAGNOSIS — Z7901 Long term (current) use of anticoagulants: Secondary | ICD-10-CM

## 2022-05-01 DIAGNOSIS — Z8673 Personal history of transient ischemic attack (TIA), and cerebral infarction without residual deficits: Secondary | ICD-10-CM | POA: Diagnosis not present

## 2022-05-01 DIAGNOSIS — M25512 Pain in left shoulder: Secondary | ICD-10-CM | POA: Diagnosis present

## 2022-05-01 DIAGNOSIS — Z87440 Personal history of urinary (tract) infections: Secondary | ICD-10-CM

## 2022-05-01 DIAGNOSIS — Z96659 Presence of unspecified artificial knee joint: Secondary | ICD-10-CM | POA: Diagnosis present

## 2022-05-01 DIAGNOSIS — Z791 Long term (current) use of non-steroidal anti-inflammatories (NSAID): Secondary | ICD-10-CM

## 2022-05-01 DIAGNOSIS — I480 Paroxysmal atrial fibrillation: Secondary | ICD-10-CM | POA: Diagnosis not present

## 2022-05-01 DIAGNOSIS — B961 Klebsiella pneumoniae [K. pneumoniae] as the cause of diseases classified elsewhere: Secondary | ICD-10-CM | POA: Diagnosis present

## 2022-05-01 DIAGNOSIS — Z90710 Acquired absence of both cervix and uterus: Secondary | ICD-10-CM | POA: Diagnosis not present

## 2022-05-01 DIAGNOSIS — E871 Hypo-osmolality and hyponatremia: Secondary | ICD-10-CM | POA: Diagnosis not present

## 2022-05-01 DIAGNOSIS — N390 Urinary tract infection, site not specified: Secondary | ICD-10-CM | POA: Diagnosis not present

## 2022-05-01 DIAGNOSIS — R000 Tachycardia, unspecified: Secondary | ICD-10-CM | POA: Diagnosis not present

## 2022-05-01 DIAGNOSIS — I10 Essential (primary) hypertension: Secondary | ICD-10-CM | POA: Diagnosis not present

## 2022-05-01 DIAGNOSIS — Z66 Do not resuscitate: Secondary | ICD-10-CM | POA: Diagnosis present

## 2022-05-01 DIAGNOSIS — I471 Supraventricular tachycardia: Secondary | ICD-10-CM | POA: Diagnosis present

## 2022-05-01 DIAGNOSIS — M19012 Primary osteoarthritis, left shoulder: Secondary | ICD-10-CM | POA: Diagnosis not present

## 2022-05-01 DIAGNOSIS — R4182 Altered mental status, unspecified: Secondary | ICD-10-CM | POA: Diagnosis not present

## 2022-05-01 DIAGNOSIS — G8929 Other chronic pain: Secondary | ICD-10-CM | POA: Diagnosis present

## 2022-05-01 DIAGNOSIS — Z7984 Long term (current) use of oral hypoglycemic drugs: Secondary | ICD-10-CM

## 2022-05-01 DIAGNOSIS — Z888 Allergy status to other drugs, medicaments and biological substances status: Secondary | ICD-10-CM

## 2022-05-01 DIAGNOSIS — N3000 Acute cystitis without hematuria: Secondary | ICD-10-CM | POA: Diagnosis not present

## 2022-05-01 DIAGNOSIS — E119 Type 2 diabetes mellitus without complications: Secondary | ICD-10-CM | POA: Diagnosis present

## 2022-05-01 DIAGNOSIS — Z79899 Other long term (current) drug therapy: Secondary | ICD-10-CM

## 2022-05-01 LAB — URINALYSIS, ROUTINE W REFLEX MICROSCOPIC
Bilirubin Urine: NEGATIVE
Glucose, UA: NEGATIVE mg/dL
Hgb urine dipstick: NEGATIVE
Ketones, ur: 20 mg/dL — AB
Nitrite: POSITIVE — AB
Protein, ur: 100 mg/dL — AB
Specific Gravity, Urine: 1.011 (ref 1.005–1.030)
WBC, UA: >50 WBC/hpf — ABNORMAL HIGH (ref 0–5)
pH: 6 (ref 5.0–8.0)

## 2022-05-01 LAB — COMPREHENSIVE METABOLIC PANEL
ALT: 12 U/L (ref 0–44)
AST: 23 U/L (ref 15–41)
Albumin: 3.9 g/dL (ref 3.5–5.0)
Alkaline Phosphatase: 38 U/L (ref 38–126)
Anion gap: 7 (ref 5–15)
BUN: 19 mg/dL (ref 8–23)
CO2: 21 mmol/L — ABNORMAL LOW (ref 22–32)
Calcium: 8.8 mg/dL — ABNORMAL LOW (ref 8.9–10.3)
Chloride: 93 mmol/L — ABNORMAL LOW (ref 98–111)
Creatinine, Ser: 0.53 mg/dL (ref 0.44–1.00)
GFR, Estimated: >60 mL/min (ref >60)
Glucose, Bld: 135 mg/dL — ABNORMAL HIGH (ref 70–99)
Potassium: 4.3 mmol/L (ref 3.5–5.1)
Sodium: 121 mmol/L — ABNORMAL LOW (ref 135–145)
Total Bilirubin: 1.2 mg/dL (ref 0.3–1.2)
Total Protein: 6.9 g/dL (ref 6.5–8.1)

## 2022-05-01 LAB — CBC WITH DIFFERENTIAL/PLATELET
Abs Immature Granulocytes: 0.05 10*3/uL (ref 0.00–0.07)
Basophils Absolute: 0.1 10*3/uL (ref 0.0–0.1)
Basophils Relative: 0 %
Eosinophils Absolute: 0 10*3/uL (ref 0.0–0.5)
Eosinophils Relative: 0 %
HCT: 33 % — ABNORMAL LOW (ref 36.0–46.0)
Hemoglobin: 11.1 g/dL — ABNORMAL LOW (ref 12.0–15.0)
Immature Granulocytes: 0 %
Lymphocytes Relative: 8 %
Lymphs Abs: 0.8 10*3/uL (ref 0.7–4.0)
MCH: 30.7 pg (ref 26.0–34.0)
MCHC: 33.6 g/dL (ref 30.0–36.0)
MCV: 91.4 fL (ref 80.0–100.0)
Monocytes Absolute: 0.8 10*3/uL (ref 0.1–1.0)
Monocytes Relative: 7 %
Neutro Abs: 9.5 10*3/uL — ABNORMAL HIGH (ref 1.7–7.7)
Neutrophils Relative %: 85 %
Platelets: 290 10*3/uL (ref 150–400)
RBC: 3.61 MIL/uL — ABNORMAL LOW (ref 3.87–5.11)
RDW: 14 % (ref 11.5–15.5)
WBC: 11.3 10*3/uL — ABNORMAL HIGH (ref 4.0–10.5)
nRBC: 0 % (ref 0.0–0.2)

## 2022-05-01 LAB — TROPONIN I (HIGH SENSITIVITY)
Troponin I (High Sensitivity): 6 ng/L (ref <18)
Troponin I (High Sensitivity): 5 ng/L (ref <18)

## 2022-05-01 LAB — SARS CORONAVIRUS 2 BY RT PCR: SARS Coronavirus 2 by RT PCR: NEGATIVE

## 2022-05-01 MED ORDER — SODIUM CHLORIDE 0.9 % IV SOLN
1.0000 g | Freq: Once | INTRAVENOUS | Status: AC
  Administered 2022-05-01: 1 g via INTRAVENOUS
  Filled 2022-05-01: qty 10

## 2022-05-01 MED ORDER — SODIUM CHLORIDE 0.9 % IV BOLUS
1000.0000 mL | Freq: Once | INTRAVENOUS | Status: AC
  Administered 2022-05-01: 1000 mL via INTRAVENOUS

## 2022-05-01 NOTE — ED Notes (Signed)
Patient transported to CT 

## 2022-05-01 NOTE — ED Provider Notes (Signed)
[facility] Provider Note   CSN: [id number] Arrival date & time: 05/01/22  1956     History  Chief Complaint  Patient presents with   Altered Mental Status    [name] is a 88 y.o. female presenting from home by EMS with altered mental status.  Supplemental history provided by the paramedics, reports the family called out to the patient has been confused for approximately 1 to 2 days.   Patient appears pleasantly confused on arrival, denies to me that she is having any pain or discomfort.  On my initial phone call no family members answering the phone.  Medical records show that she does have a history of urinary tract infections, paroxysmal A-fib.  HPI     Home Medications Prior to Admission medications   Medication Sig Start Date End Date Taking? Authorizing Provider  acetaminophen (TYLENOL) 500 MG tablet Take 1,000 mg by mouth 2 (two) times daily.   Yes [provider]  atenolol (TENORMIN) 50 MG tablet TAKE 1 TABLET BY MOUTH TWICE A DAY 08/07/21  Yes Satira Sark, MD  ELIQUIS 5 MG TABS tablet Take 1 tablet (5 mg total) by mouth 2 (two) times daily. 01/09/22  Yes Satira Sark, MD  meclizine (ANTIVERT) 25 MG tablet Take 1 tablet by mouth 3 (three) times daily as needed. 04/26/17  Yes [provider]  meloxicam (MOBIC) 7.5 MG tablet Take 7.5 mg by mouth every other day. 02/09/22  Yes [provider]  metFORMIN (GLUCOPHAGE-XR) 500 MG 24 hr tablet Take 2 tabs (1,000mg ) by mouth every morning & 1 tab (500mg ) every evening 03/24/13  Yes [provider]  Multiple Vitamin (MULTIVITAMIN) tablet Take 1 tablet by mouth daily.   Yes [provider]  multivitamin-lutein (OCUVITE-LUTEIN) CAPS capsule Take 1 capsule by mouth 2 (two) times daily.   Yes [provider]  nitroGLYCERIN (NITROSTAT) 0.4 MG SL tablet Place 1 tablet (0.4 mg total) under the tongue every 5 (five) minutes x 3 doses as needed for chest  pain (if no relief after 3rd dose, proceed to the ED for an evaluation or call 911). 07/14/19 05/01/22 Yes Satira Sark, MD  simvastatin (ZOCOR) 10 MG tablet Take 10 mg by mouth at bedtime.   Yes [provider]  Calcium Carbonate-Vitamin D 600-400 MG-UNIT tablet Take 1 tablet by mouth daily. Patient not taking: Reported on 12/07/2021    [provider]  cefUROXime (CEFTIN) 500 MG tablet Take 500 mg by mouth 2 (two) times daily. Patient not taking: Reported on 03/07/2022 08/06/21   [provider]  docusate sodium (COLACE) 100 MG capsule Take 100 mg by mouth daily as needed.  Patient not taking: Reported on 12/07/2021    [provider]      Allergies    Toprol xl [metoprolol succinate]    Review of Systems   Review of Systems  Physical Exam Updated Vital Signs BP (!) 150/81   Pulse 80   Temp 98.3 F (36.8 C) (Oral)   Resp (!) 22   Ht 5\' 1"  (1.549 m)   Wt 63 kg   SpO2 95%   BMI 26.24 kg/m  Physical Exam  ED Results / Procedures / Treatments   Labs (all labs ordered are listed, but only abnormal results are displayed) Labs Reviewed  COMPREHENSIVE METABOLIC PANEL - Abnormal; Notable for the following components:      Result Value   Sodium 121 (*)    Chloride 93 (*)  CO2 21 (*)    Glucose, Bld 135 (*)    Calcium 8.8 (*)    All other components within normal limits  CBC WITH DIFFERENTIAL/PLATELET - Abnormal; Notable for the following components:   WBC 11.3 (*)    RBC 3.61 (*)    Hemoglobin 11.1 (*)    HCT 33.0 (*)    Neutro Abs 9.5 (*)    All other components within normal limits  URINALYSIS, ROUTINE W REFLEX MICROSCOPIC - Abnormal; Notable for the following components:   APPearance HAZY (*)    Ketones, ur 20 (*)    Protein, ur 100 (*)    Nitrite POSITIVE (*)    Leukocytes,Ua MODERATE (*)    WBC, UA >50 (*)    Bacteria, UA MANY (*)    All other components within normal limits  SARS CORONAVIRUS 2 BY RT PCR  URINE CULTURE   TROPONIN I (HIGH SENSITIVITY)  TROPONIN I (HIGH SENSITIVITY)    EKG EKG Interpretation  Date/Time:  Tuesday May 01 2022 20:03:31 EDT Ventricular Rate:  103 PR Interval:    QRS Duration: 106 QT Interval:  346 QTC Calculation: 453 R Axis:   97 Text Interpretation: Atrial fibrillation Right axis deviation Minimal ST depression, diffuse leads Confirmed by Octaviano Glow [phone]) on 05/01/2022 9:06:38 PM  Radiology CT Head Wo Contrast  Result Date: 05/01/2022 CLINICAL DATA:  Altered mental status EXAM: CT HEAD WITHOUT CONTRAST TECHNIQUE: Contiguous axial images were obtained from the base of the skull through the vertex without intravenous contrast. RADIATION DOSE REDUCTION: This exam was performed according to the departmental dose-optimization program which includes automated exposure control, adjustment of the mA and/or kV according to patient size and/or use of iterative reconstruction technique. COMPARISON:  08/15/2021 FINDINGS: Brain: There is atrophy and chronic small vessel disease changes. No acute intracranial abnormality. Specifically, no hemorrhage, hydrocephalus, mass lesion, acute infarction, or significant intracranial injury. Vascular: No hyperdense vessel or unexpected calcification. Skull: No acute calvarial abnormality. Sinuses/Orbits: No acute finding Other: None IMPRESSION: Atrophy, chronic microvascular disease. No acute intracranial abnormality. Electronically Signed   By: Rolm Baptise M.D.   On: 05/01/2022 20:37    Procedures Procedures    Medications Ordered in ED Medications  sodium chloride 0.9 % bolus 1,000 mL (0 mLs Intravenous Stopped 05/01/22 2304)  cefTRIAXone (ROCEPHIN) 1 g in sodium chloride 0.9 % 100 mL IVPB (0 g Intravenous Stopped 05/01/22 2304)    ED Course/ Medical Decision Making/ A&P Clinical Course as of 05/01/22 2314  Tue May 01, 2022  2218 Work-up is consistent with UTI, Rocephin ordered. [MT]    Clinical Course User Index [MT] Enriqueta Augusta,  Carola Rhine, MD                           Medical Decision Making Amount and/or Complexity of Data Reviewed Labs: ordered. Radiology: ordered.  Risk Decision regarding hospitalization.   This patient presents to the ED with concern for altered mental status. This involves an extensive number of treatment options, and is a complaint that carries with it a high risk of complications and morbidity.  The differential diagnosis includes UTI versus anemia versus CVA versus  Co-morbidities that complicate the patient evaluation: Age  Additional history obtained from EMS   I ordered and personally interpreted labs.  The pertinent results include: Hyponatremia, hypochloremia, UA consistent with infection  I ordered imaging studies including CT scan of the head I independently visualized and interpreted imaging which showed  no acute infarct or lesion I agree with the radiologist interpretation  Per my interpretation the patient's ECG shows no ischemic findings  I ordered medication including IV Rocephin for UTI, normal saline bolus for hyponatremia  Test Considered: Doubt acute PE, meningitis.  Do not feel LP was indicated at this time  After the interventions noted above, I reevaluated the patient and found that they have: stayed the same   Dispostion:  After consideration of the diagnostic results and the patients response to treatment, I feel that the patent would benefit from medical admission.         Final Clinical Impression(s) / ED Diagnoses Final diagnoses:  Altered mental status, unspecified altered mental status type  Hyponatremia  Urinary tract infection without hematuria, site unspecified    Rx / DC Orders ED Discharge Orders     None         Ying Blankenhorn, Carola Rhine, MD 05/01/22 2314

## 2022-05-01 NOTE — ED Triage Notes (Signed)
Per family pt has been confused x one day. Pt denies any complaints.

## 2022-05-02 ENCOUNTER — Encounter (HOSPITAL_COMMUNITY): Payer: Self-pay | Admitting: Internal Medicine

## 2022-05-02 DIAGNOSIS — M25512 Pain in left shoulder: Secondary | ICD-10-CM | POA: Diagnosis present

## 2022-05-02 DIAGNOSIS — Z8673 Personal history of transient ischemic attack (TIA), and cerebral infarction without residual deficits: Secondary | ICD-10-CM | POA: Diagnosis not present

## 2022-05-02 DIAGNOSIS — N3000 Acute cystitis without hematuria: Secondary | ICD-10-CM | POA: Diagnosis not present

## 2022-05-02 DIAGNOSIS — Z8249 Family history of ischemic heart disease and other diseases of the circulatory system: Secondary | ICD-10-CM | POA: Diagnosis not present

## 2022-05-02 DIAGNOSIS — M19012 Primary osteoarthritis, left shoulder: Secondary | ICD-10-CM | POA: Diagnosis not present

## 2022-05-02 DIAGNOSIS — I480 Paroxysmal atrial fibrillation: Secondary | ICD-10-CM

## 2022-05-02 DIAGNOSIS — I10 Essential (primary) hypertension: Secondary | ICD-10-CM | POA: Diagnosis not present

## 2022-05-02 DIAGNOSIS — G8929 Other chronic pain: Secondary | ICD-10-CM | POA: Diagnosis present

## 2022-05-02 DIAGNOSIS — Z20822 Contact with and (suspected) exposure to covid-19: Secondary | ICD-10-CM | POA: Diagnosis present

## 2022-05-02 DIAGNOSIS — Z90710 Acquired absence of both cervix and uterus: Secondary | ICD-10-CM | POA: Diagnosis not present

## 2022-05-02 DIAGNOSIS — R001 Bradycardia, unspecified: Secondary | ICD-10-CM | POA: Diagnosis present

## 2022-05-02 DIAGNOSIS — E538 Deficiency of other specified B group vitamins: Secondary | ICD-10-CM | POA: Diagnosis present

## 2022-05-02 DIAGNOSIS — D638 Anemia in other chronic diseases classified elsewhere: Secondary | ICD-10-CM | POA: Diagnosis present

## 2022-05-02 DIAGNOSIS — B961 Klebsiella pneumoniae [K. pneumoniae] as the cause of diseases classified elsewhere: Secondary | ICD-10-CM | POA: Diagnosis present

## 2022-05-02 DIAGNOSIS — G9341 Metabolic encephalopathy: Secondary | ICD-10-CM | POA: Diagnosis not present

## 2022-05-02 DIAGNOSIS — N390 Urinary tract infection, site not specified: Secondary | ICD-10-CM

## 2022-05-02 DIAGNOSIS — Z87440 Personal history of urinary (tract) infections: Secondary | ICD-10-CM | POA: Diagnosis not present

## 2022-05-02 DIAGNOSIS — E871 Hypo-osmolality and hyponatremia: Secondary | ICD-10-CM

## 2022-05-02 DIAGNOSIS — I471 Supraventricular tachycardia: Secondary | ICD-10-CM | POA: Diagnosis present

## 2022-05-02 DIAGNOSIS — Z96659 Presence of unspecified artificial knee joint: Secondary | ICD-10-CM | POA: Diagnosis present

## 2022-05-02 DIAGNOSIS — I2510 Atherosclerotic heart disease of native coronary artery without angina pectoris: Secondary | ICD-10-CM | POA: Diagnosis present

## 2022-05-02 DIAGNOSIS — Z66 Do not resuscitate: Secondary | ICD-10-CM | POA: Diagnosis present

## 2022-05-02 DIAGNOSIS — E119 Type 2 diabetes mellitus without complications: Secondary | ICD-10-CM | POA: Diagnosis present

## 2022-05-02 DIAGNOSIS — E878 Other disorders of electrolyte and fluid balance, not elsewhere classified: Secondary | ICD-10-CM | POA: Diagnosis present

## 2022-05-02 DIAGNOSIS — E785 Hyperlipidemia, unspecified: Secondary | ICD-10-CM | POA: Diagnosis present

## 2022-05-02 LAB — COMPREHENSIVE METABOLIC PANEL
ALT: 14 U/L (ref 0–44)
AST: 22 U/L (ref 15–41)
Albumin: 3.5 g/dL (ref 3.5–5.0)
Alkaline Phosphatase: 35 U/L — ABNORMAL LOW (ref 38–126)
Anion gap: 9 (ref 5–15)
BUN: 16 mg/dL (ref 8–23)
CO2: 21 mmol/L — ABNORMAL LOW (ref 22–32)
Calcium: 8.5 mg/dL — ABNORMAL LOW (ref 8.9–10.3)
Chloride: 96 mmol/L — ABNORMAL LOW (ref 98–111)
Creatinine, Ser: 0.54 mg/dL (ref 0.44–1.00)
GFR, Estimated: >60 mL/min (ref >60)
Glucose, Bld: 129 mg/dL — ABNORMAL HIGH (ref 70–99)
Potassium: 3.6 mmol/L (ref 3.5–5.1)
Sodium: 126 mmol/L — ABNORMAL LOW (ref 135–145)
Total Bilirubin: 1.2 mg/dL (ref 0.3–1.2)
Total Protein: 6.1 g/dL — ABNORMAL LOW (ref 6.5–8.1)

## 2022-05-02 LAB — CBC WITH DIFFERENTIAL/PLATELET
Abs Immature Granulocytes: 0.04 10*3/uL (ref 0.00–0.07)
Basophils Absolute: 0 10*3/uL (ref 0.0–0.1)
Basophils Relative: 0 %
Eosinophils Absolute: 0 10*3/uL (ref 0.0–0.5)
Eosinophils Relative: 0 %
HCT: 31.6 % — ABNORMAL LOW (ref 36.0–46.0)
Hemoglobin: 10.5 g/dL — ABNORMAL LOW (ref 12.0–15.0)
Immature Granulocytes: 0 %
Lymphocytes Relative: 7 %
Lymphs Abs: 0.7 10*3/uL (ref 0.7–4.0)
MCH: 30.6 pg (ref 26.0–34.0)
MCHC: 33.2 g/dL (ref 30.0–36.0)
MCV: 92.1 fL (ref 80.0–100.0)
Monocytes Absolute: 0.7 10*3/uL (ref 0.1–1.0)
Monocytes Relative: 7 %
Neutro Abs: 8.4 10*3/uL — ABNORMAL HIGH (ref 1.7–7.7)
Neutrophils Relative %: 86 %
Platelets: 267 10*3/uL (ref 150–400)
RBC: 3.43 MIL/uL — ABNORMAL LOW (ref 3.87–5.11)
RDW: 14.1 % (ref 11.5–15.5)
WBC: 9.9 10*3/uL (ref 4.0–10.5)
nRBC: 0 % (ref 0.0–0.2)

## 2022-05-02 LAB — BASIC METABOLIC PANEL
Anion gap: 10 (ref 5–15)
BUN: 16 mg/dL (ref 8–23)
CO2: 20 mmol/L — ABNORMAL LOW (ref 22–32)
Calcium: 8.2 mg/dL — ABNORMAL LOW (ref 8.9–10.3)
Chloride: 94 mmol/L — ABNORMAL LOW (ref 98–111)
Creatinine, Ser: 0.53 mg/dL (ref 0.44–1.00)
GFR, Estimated: >60 mL/min (ref >60)
Glucose, Bld: 151 mg/dL — ABNORMAL HIGH (ref 70–99)
Potassium: 3.5 mmol/L (ref 3.5–5.1)
Sodium: 124 mmol/L — ABNORMAL LOW (ref 135–145)

## 2022-05-02 LAB — MAGNESIUM: Magnesium: 1.3 mg/dL — ABNORMAL LOW (ref 1.7–2.4)

## 2022-05-02 LAB — TSH: TSH: 1.43 u[IU]/mL (ref 0.350–4.500)

## 2022-05-02 MED ORDER — ONDANSETRON HCL 4 MG PO TABS: 4.0000 mg | ORAL_TABLET | Freq: Four times a day (QID) | ORAL | Status: DC | PRN

## 2022-05-02 MED ORDER — MORPHINE SULFATE (PF) 2 MG/ML IV SOLN: 2.0000 mg | INTRAVENOUS | Status: DC | PRN

## 2022-05-02 MED ORDER — ACETAMINOPHEN 325 MG PO TABS
650.0000 mg | ORAL_TABLET | Freq: Four times a day (QID) | ORAL | Status: DC | PRN
  Administered 2022-05-03 (×2): 650 mg via ORAL
  Filled 2022-05-02 (×2): qty 2

## 2022-05-02 MED ORDER — MECLIZINE HCL 12.5 MG PO TABS: 25.0000 mg | ORAL_TABLET | Freq: Three times a day (TID) | ORAL | Status: DC | PRN

## 2022-05-02 MED ORDER — SODIUM CHLORIDE 0.9 % IV SOLN: INTRAVENOUS | Status: DC

## 2022-05-02 MED ORDER — DOCUSATE SODIUM 100 MG PO CAPS: 100.0000 mg | ORAL_CAPSULE | Freq: Every day | ORAL | Status: DC | PRN

## 2022-05-02 MED ORDER — ONDANSETRON HCL 4 MG/2ML IJ SOLN: 4.0000 mg | Freq: Four times a day (QID) | INTRAMUSCULAR | Status: DC | PRN

## 2022-05-02 MED ORDER — MAGNESIUM SULFATE 4 GM/100ML IV SOLN
4.0000 g | Freq: Once | INTRAVENOUS | Status: AC
  Administered 2022-05-02: 4 g via INTRAVENOUS
  Filled 2022-05-02: qty 100

## 2022-05-02 MED ORDER — OXYCODONE HCL 5 MG PO TABS: 5.0000 mg | ORAL_TABLET | ORAL | Status: DC | PRN

## 2022-05-02 MED ORDER — SIMVASTATIN 20 MG PO TABS
10.0000 mg | ORAL_TABLET | Freq: Every day | ORAL | Status: DC
  Administered 2022-05-02 – 2022-05-03 (×2): 10 mg via ORAL
  Filled 2022-05-02 (×2): qty 1

## 2022-05-02 MED ORDER — SODIUM CHLORIDE 1 G PO TABS
1.0000 g | ORAL_TABLET | Freq: Three times a day (TID) | ORAL | Status: DC
  Administered 2022-05-02 – 2022-05-04 (×7): 1 g via ORAL
  Filled 2022-05-02 (×15): qty 1

## 2022-05-02 MED ORDER — SODIUM CHLORIDE 0.9 % IV SOLN
1.0000 g | INTRAVENOUS | Status: DC
  Administered 2022-05-02 – 2022-05-03 (×2): 1 g via INTRAVENOUS
  Filled 2022-05-02 (×2): qty 10

## 2022-05-02 MED ORDER — FOLIC ACID 1 MG PO TABS
1.0000 mg | ORAL_TABLET | Freq: Every day | ORAL | Status: DC
  Administered 2022-05-02 – 2022-05-04 (×3): 1 mg via ORAL
  Filled 2022-05-02 (×3): qty 1

## 2022-05-02 MED ORDER — APIXABAN 5 MG PO TABS
5.0000 mg | ORAL_TABLET | Freq: Two times a day (BID) | ORAL | Status: DC
  Administered 2022-05-02 – 2022-05-04 (×5): 5 mg via ORAL
  Filled 2022-05-02 (×5): qty 1

## 2022-05-02 MED ORDER — ACETAMINOPHEN 650 MG RE SUPP: 650.0000 mg | Freq: Four times a day (QID) | RECTAL | Status: DC | PRN

## 2022-05-02 NOTE — Plan of Care (Signed)
  Problem: Acute Rehab PT Goals(only PT should resolve) Goal: Pt Will Go Supine/Side To Sit Outcome: Progressing Flowsheets (Taken 05/02/2022 1153) Pt will go Supine/Side to Sit:  Independently  with modified independence Goal: Patient Will Transfer Sit To/From Stand Outcome: Progressing Flowsheets (Taken 05/02/2022 1153) Patient will transfer sit to/from stand: with modified independence Goal: Pt Will Transfer Bed To Chair/Chair To Bed Outcome: Progressing Flowsheets (Taken 05/02/2022 1153) Pt will Transfer Bed to Chair/Chair to Bed: with modified independence Goal: Pt Will Ambulate Outcome: Progressing Flowsheets (Taken 05/02/2022 1153) Pt will Ambulate:  100 feet  with modified independence  with supervision  with rolling walker   11:54 AM, 05/02/22 Lonell Grandchild, MPT Physical Therapist with [facility] [phone] office [phone] mobile phone

## 2022-05-02 NOTE — Assessment & Plan Note (Signed)
-   Holding atenolol at this time due to bradycardia -Continue to monitor

## 2022-05-02 NOTE — Assessment & Plan Note (Signed)
-   Potassium none 121, previous lab work shows 134 -Family reports patient has been getting confused about meals, so there is likely a decrease in p.o. intake -1 L normal saline given in the ED, stat BMP ordered -We will continue normal saline 75 mill per hour, but will change fluids as indicated after BMP is resulted -Start salt tabs with meals -Continue to monitor

## 2022-05-02 NOTE — TOC Initial Note (Addendum)
Transition of Care [facility]) - Initial/Assessment Note    Patient Details  Name: [name] MRN: [id number] Date of Birth: [birth year]  [facility] CM/SW Contact:    Iona Beard, [place] Phone Number: 05/02/2022, 11:25 AM  Clinical Narrative:                 TOC updated that PT is recommending [facility] for pt at D/C. CSW met with pt and daughter in room to complete assessment. Pt lives alone and is mostly independent in completing ADLs. Pts son and daughter live close by and check on pt daily. Pts daughter provides transportation as needed. CSW inquired about interest in [facility]. Family states that pt had [facility] services with [facility] in the past. They are interested in more [facility] services such as PT/OT/Rn/Aide. CSW explained that a [facility] agency can be set up for those. CSW provided pts daughter with a list of [place] agencies. Once they have picked one, they will call CSW. TOC to follow.   Addendum 1:00pm: CSW updated by family that they would like to use [facility] for [facility] PT services. CSW spoke with Rolla Plate at [facility] who states that they are able to accept referral. They request that H&P, demographics, PT eval and orders be faxed to their office at [phone]. TOC to follow.   Expected Discharge Plan: [place] Barriers to Discharge: Continued Medical Work up   Patient Goals and CMS Choice Patient states their goals for this hospitalization and ongoing recovery are:: home with [facility] CMS Medicare.gov Compare Post Acute Care list provided to:: Patient Choice offered to / list presented to : Patient, Adult Children  Expected Discharge Plan and Services Expected Discharge Plan: [place] In-house Referral: Clinical Social Work   Post Acute Care Choice: [place] arrangements for the past 2 months: [place]                                      Prior Living Arrangements/Services Living arrangements for the past 2  months: Single Family Home Lives with:: Self Patient language and need for interpreter reviewed:: Yes Do you feel safe going back to the place where you live?: Yes      Need for Family Participation in Patient Care: Yes (Comment) Care giver support system in place?: Yes (comment)   Criminal Activity/Legal Involvement Pertinent to Current Situation/Hospitalization: No - Comment as needed  Activities of Daily Living Home Assistive Devices/Equipment: Cane (specify quad or straight), Walker (specify type) ADL Screening (condition at time of admission) Patient's cognitive ability adequate to safely complete daily activities?: Yes Is the patient deaf or have difficulty hearing?: No Does the patient have difficulty seeing, even when wearing glasses/contacts?: No Does the patient have difficulty concentrating, remembering, or making decisions?: Yes Patient able to express need for assistance with ADLs?: Yes Does the patient have difficulty dressing or bathing?: Yes Independently performs ADLs?: No Communication: Independent Is this a change from baseline?: Pre-admission baseline Dressing (OT): Needs assistance Is this a change from baseline?: Pre-admission baseline Grooming: Needs assistance Is this a change from baseline?: Pre-admission baseline Feeding: Independent Bathing: Needs assistance Is this a change from baseline?: Pre-admission baseline Toileting: Needs assistance In/Out Bed: Needs assistance Walks in Home: Needs assistance Is this a change from baseline?: Pre-admission baseline Does the patient have difficulty walking or climbing stairs?: No  Weakness of Legs: Both Weakness of Arms/Hands: Both  Permission Sought/Granted                  Emotional Assessment Appearance:: Appears stated age Attitude/Demeanor/Rapport: Engaged Affect (typically observed): Accepting Orientation: : Oriented to Self, Oriented to Place, Oriented to  Time, Oriented to Situation Alcohol /  Substance Use: Not Applicable Psych Involvement: No (comment)  Admission diagnosis:  Hyponatremia [E87.1] Urinary tract infection without hematuria, site unspecified [N39.0] Altered mental status, unspecified altered mental status type [S47.15] Acute metabolic encephalopathy [A06.38] Patient Active Problem List   Diagnosis Date Noted   Hyponatremia 05/02/2022   Paroxysmal atrial fibrillation ([place]) 05/02/2022   UTI (urinary tract infection) 68/54/8830   Acute metabolic encephalopathy 14/15/9733   Coronary atherosclerosis of native coronary artery 10/13/2012   Essential hypertension, benign 09/12/2011   PSVT (paroxysmal supraventricular tachycardia) ([place]) 08/19/2009   PCP:  Manon Hilding, MD Pharmacy:   [id number] [place] [id number]: [phone]: [phone] [id number]: [phone]: [phone]    Social Determinants of Health (SDOH) Interventions    Readmission Risk Interventions     No data to display

## 2022-05-02 NOTE — Assessment & Plan Note (Signed)
-   Holding atenolol as patient was bradycardic down to 50 in the ER -If heart rate starts to increase will add atenolol back -Continue Eliquis -Monitor on telemetry

## 2022-05-02 NOTE — Assessment & Plan Note (Signed)
-   Multifactorial -Hyponatremia down to 121 likely contributing -UTI likely the main contributor -Treat per those assessments respectively

## 2022-05-02 NOTE — Progress Notes (Signed)
Patient admitted early this morning for altered mental status possibly from hyponatremia and UTI and has been started on IV fluids and antibiotics.  Patient seen and examined at bedside.  She is awake but still slow to respond and confused.  I have reviewed patient's medical records including this morning's H&P, vitals, labs and medications myself.  This morning, sodium is 126, magnesium is 1.3.  Continue monitoring sodium.  Replace magnesium.  Repeat a.m. labs.  Continue antibiotics.  Follow urine culture.  If mental status does not improve, might need further imaging of the brain.

## 2022-05-02 NOTE — H&P (Signed)
History and Physical    Patient: [name] UTM:[id number] DOB: 07/13/1927 DOA: 05/01/2022 DOS: the patient was seen and examined on 05/02/2022 PCP: Manon Hilding, MD  Patient coming from: Home  Chief Complaint:  Chief Complaint  Patient presents with   Altered Mental Status   HPI: [name] is a 88 y.o. female with medical history significant of atrial fibrillation, coronary artery disease, hyperlipidemia, diet-controlled diabetes mellitus presents to the ED with a chief complaint of altered mental status.  Daughter and daughter-in-law are at bedside and provides all the history.  Patient is pleasant, but does not recognize her family members at bedside, and does not answer when asked where we are or why she is here.  Family reports that they check on patient several times a day but she does live independently.  At 7 AM 1 family member went over to make sure she had breakfast and took her pills.  9:30 AM another family member spoke to her and she was at her baseline.  This started not being able to reach patient in the afternoon, and assumed that she had gone outside to sit in the sun.  When they still had not reached her 4:00 family member went over to check on her and found her sitting on the toilet.  They are unsure how many hours she was there.  She was too weak to stand up.  She was able to recognize her family member at that time, but had no idea why, or for how long she had been on the commode.  This is very similar to presentation she had 5 months ago to [facility] when she had a UTI, so they decided to bring her into the ER.  In hindsight they do notice that for the last 2 weeks she was little more confused than normal.  She cannot remember what she ate or if she ate a meal.  She was getting confused about her medications, and having trouble getting oriented after naps.  Family reports most the time she is very clear.  In the ER patient has become more confused.  Likely lack of sleep  has something to do with that.  She was not able to remember her birth year, but could remember her birth day and month.  They report that this is never happened to her before.  And when I examined her I asked her who the people were at bedside and she said her sister, which is incorrect and never could say their names or how they related to her.  This was upon waking her, which may have already said is a hard time for her to get oriented.  Family reports when they spoke with her yesterday and when they spoke with her in the morning she had no complaints.  To their knowledge she has had no fevers, chills, urinary frequency/urgency, or other symptoms.  Patient does not smoke, does not drink, does not use illicit drugs.  She is vaccinated for COVID.  Patient is DNR with gold form at bedside. Review of Systems: unable to review all systems due to the inability of the patient to answer questions. Past Medical History:  Diagnosis Date   Atrial fibrillation ([place])    Coronary atherosclerosis of native coronary artery    Nonobstructive   Dyslipidemia    PSVT (paroxysmal supraventricular tachycardia) (HCC)    TIA (transient ischemic attack)    Type 2 diabetes mellitus ([place])    Diet controlled  Past Surgical History:  Procedure Laterality Date   ABDOMINAL HYSTERECTOMY     CARPAL TUNNEL RELEASE     x's 2   CHOLECYSTECTOMY     REPLACEMENT TOTAL KNEE  2002 & 2012   SHOULDER SURGERY  2001   Social History:  reports that she has never smoked. She has never used smokeless tobacco. She reports that she does not drink alcohol and does not use drugs.  Allergies  Allergen Reactions   Toprol Xl [Metoprolol Succinate] Other (See Comments)    HAIR LOSS    Family History  Problem Relation Age of Onset   Coronary artery disease Mother     Prior to Admission medications   Medication Sig Start Date End Date Taking? Authorizing Provider  acetaminophen (TYLENOL) 500 MG tablet Take 1,000 mg by mouth 2  (two) times daily.   Yes [provider]  atenolol (TENORMIN) 50 MG tablet TAKE 1 TABLET BY MOUTH TWICE A DAY 08/07/21  Yes Satira Sark, MD  ELIQUIS 5 MG TABS tablet Take 1 tablet (5 mg total) by mouth 2 (two) times daily. 01/09/22  Yes Satira Sark, MD  meclizine (ANTIVERT) 25 MG tablet Take 1 tablet by mouth 3 (three) times daily as needed. 04/26/17  Yes [provider]  meloxicam (MOBIC) 7.5 MG tablet Take 7.5 mg by mouth every other day. 02/09/22  Yes [provider]  metFORMIN (GLUCOPHAGE-XR) 500 MG 24 hr tablet Take 2 tabs (1,000mg ) by mouth every morning & 1 tab (500mg ) every evening 03/24/13  Yes [provider]  Multiple Vitamin (MULTIVITAMIN) tablet Take 1 tablet by mouth daily.   Yes [provider]  multivitamin-lutein (OCUVITE-LUTEIN) CAPS capsule Take 1 capsule by mouth 2 (two) times daily.   Yes [provider]  nitroGLYCERIN (NITROSTAT) 0.4 MG SL tablet Place 1 tablet (0.4 mg total) under the tongue every 5 (five) minutes x 3 doses as needed for chest pain (if no relief after 3rd dose, proceed to the ED for an evaluation or call 911). 07/14/19 05/01/22 Yes Satira Sark, MD  simvastatin (ZOCOR) 10 MG tablet Take 10 mg by mouth at bedtime.   Yes [provider]  Calcium Carbonate-Vitamin D 600-400 MG-UNIT tablet Take 1 tablet by mouth daily. Patient not taking: Reported on 12/07/2021    [provider]  cefUROXime (CEFTIN) 500 MG tablet Take 500 mg by mouth 2 (two) times daily. Patient not taking: Reported on 03/07/2022 08/06/21   [provider]  docusate sodium (COLACE) 100 MG capsule Take 100 mg by mouth daily as needed.  Patient not taking: Reported on 12/07/2021    [provider]    Physical Exam: Vitals:   05/01/22 2210 05/01/22 2230 05/02/22 0000 05/02/22 0100  BP: (!) 150/71 (!) 150/81 139/86 131/65  Pulse: 94 80 67 86  Resp: (!) 22 (!) 22 (!) 24 16  Temp:      TempSrc:       SpO2: 94% 95% 93% 93%  Weight:      Height:       1.  General: Patient lying supine in bed, head of bed elevated, no acute distress   2. Psychiatric: Somnolent and oriented to self, mood and behavior normal for situation, pleasant and cooperative with exam   3. Neurologic: Speech and language are normal, face is symmetric, moves all 4 extremities voluntarily, altered from baseline without focal deficit   4. HEENMT:  Head is atraumatic, normocephalic, pupils reactive to light, neck is  supple, trachea is midline, mucous membranes are moist   5. Respiratory : Lungs are clear to auscultation bilaterally without wheezing, rhonchi, rales, no cyanosis, no increase in work of breathing or accessory muscle use   6. Cardiovascular : Heart rate normal, rhythm is regular, no murmurs, rubs or gallops, no peripheral edema, peripheral pulses palpated   7. Gastrointestinal:  Abdomen is soft, nondistended, no grimace to palpation, bowel sounds active, no masses or organomegaly palpated   8. Skin:  Skin is warm, dry and intact without rashes, acute lesions, or ulcers on limited exam   9.Musculoskeletal:  No acute deformities or trauma, no asymmetry in tone, no peripheral edema, peripheral pulses palpated, no tenderness to palpation in the extremities  Data Reviewed: In the ED 98.3, heart rate 50-94, respiratory rate 16-23, blood pressure 150/71-155/91, satting at 98% Very slight leukocytosis at 11.3, hemoglobin stable 11.1 Chemistry reveals a hyponatremia 121, decreased bicarb at 21 Troponins normal at 6 and 5 UA is indicative of UTI, urine culture pending CT head without contrast shows no acute findings COVID-negative Rocephin started in the ED, 1 L normal saline bolus given, and urine culture pending Admission requested for UTI with acute metabolic encephalopathy  Assessment and Plan: * Acute metabolic encephalopathy - Multifactorial -Hyponatremia down to 121 likely contributing -UTI  likely the main contributor -Treat per those assessments respectively   UTI (urinary tract infection) - UA indicative of UTI -Urine culture pending -No previous culture in our system -Continue Rocephin  Paroxysmal atrial fibrillation (HCC) - Holding atenolol as patient was bradycardic down to 50 in the ER -If heart rate starts to increase will add atenolol back -Continue Eliquis -Monitor on telemetry  Hyponatremia - Potassium none 121, previous lab work shows 134 -Family reports patient has been getting confused about meals, so there is likely a decrease in p.o. intake -1 L normal saline given in the ED, stat BMP ordered -We will continue normal saline 75 mill per hour, but will change fluids as indicated after BMP is resulted -Start salt tabs with meals -Continue to monitor  Essential hypertension, benign - Holding atenolol at this time due to bradycardia -Continue to monitor      Advance Care Planning:   Code Status: DNR   Consults: None  Family Communication: Daughter and daughter-in-law at bedside  Severity of Illness: The appropriate patient status for this patient is OBSERVATION. Observation status is judged to be reasonable and necessary in order to provide the required intensity of service to ensure the patient's safety. The patient's presenting symptoms, physical exam findings, and initial radiographic and laboratory data in the context of their medical condition is felt to place them at decreased risk for further clinical deterioration. Furthermore, it is anticipated that the patient will be medically stable for discharge from the hospital within 2 midnights of admission.   Author: Rolla Plate, DO 05/02/2022 2:01 AM  For on call review [email or web address].

## 2022-05-02 NOTE — Assessment & Plan Note (Signed)
-   UA indicative of UTI -Urine culture pending -No previous culture in our system -Continue Rocephin

## 2022-05-02 NOTE — Evaluation (Signed)
Physical Therapy Evaluation Patient Details Name: [name] MRN: [id number] DOB: May 22, 1927 Today's Date: 05/02/2022  History of Present Illness  [name] is a 88 y.o. female with medical history significant of atrial fibrillation, coronary artery disease, hyperlipidemia, diet-controlled diabetes mellitus presents to the ED with a chief complaint of altered mental status.  Daughter and daughter-in-law are at bedside and provides all the history.  Patient is pleasant, but does not recognize her family members at bedside, and does not answer when asked where we are or why she is here.  Family reports that they check on patient several times a day but she does live independently.  At 7 AM 1 family member went over to make sure she had breakfast and took her pills.  9:30 AM another family member spoke to her and she was at her baseline.  This started not being able to reach patient in the afternoon, and assumed that she had gone outside to sit in the sun.  When they still had not reached her 4:00 family member went over to check on her and found her sitting on the toilet.  They are unsure how many hours she was there.  She was too weak to stand up.  She was able to recognize her family member at that time, but had no idea why, or for how long she had been on the commode.  This is very similar to presentation she had 5 months ago to [facility] when she had a UTI, so they decided to bring her into the ER.  In hindsight they do notice that for the last 2 weeks she was little more confused than normal.  She cannot remember what she ate or if she ate a meal.  She was getting confused about her medications, and having trouble getting oriented after naps.  Family reports most the time she is very clear.  In the ER patient has become more confused.  Likely lack of sleep has something to do with that.  She was not able to remember her birth year, but could remember her birth day and month.  They report that this  is never happened to her before.  And when I examined her I asked her who the people were at bedside and she said her sister, which is incorrect and never could say their names or how they related to her.  This was upon waking her, which may have already said is a hard time for her to get oriented.  Family reports when they spoke with her yesterday and when they spoke with her in the morning she had no complaints.  To their knowledge she has had no fevers, chills, urinary frequency/urgency, or other symptoms.   Clinical Impression  Patient functioning near baseline for functional mobility and gait other than requiring increased time to sit up at bedside, has to lean on nearby objects for support during transfers without AD, safer using RW demonstrating good return for ambulation in room and hallway without loss of balance.  Patient tolerated sitting up in chair after therapy with her son present in room.  Patient will benefit from continued skilled physical therapy in hospital and recommended venue below to increase strength, balance, endurance for safe ADLs and gait.          Recommendations for follow up therapy are one component of a multi-disciplinary discharge planning process, led by the attending physician.  Recommendations may be updated based on patient status, additional functional criteria and  insurance authorization.  Follow Up Recommendations Home health PT    Assistance Recommended at Discharge Set up Supervision/Assistance  Patient can return home with the following  A little help with walking and/or transfers;A little help with bathing/dressing/bathroom;Help with stairs or ramp for entrance;Assistance with cooking/housework    Equipment Recommendations None recommended by PT  Recommendations for Other Services       Functional Status Assessment Patient has had a recent decline in their functional status and demonstrates the ability to make significant improvements in function in  a reasonable and predictable amount of time.     Precautions / Restrictions Precautions Precautions: Fall Restrictions Weight Bearing Restrictions: No      Mobility  Bed Mobility Overal bed mobility: Needs Assistance Bed Mobility: Supine to Sit     Supine to sit: Modified independent (Device/Increase time), Supervision     General bed mobility comments: increased time, labored movement    Transfers Overall transfer level: Needs assistance Equipment used: Rolling walker (2 wheels) Transfers: Sit to/from Stand, Bed to chair/wheelchair/BSC Sit to Stand: Supervision   Step pivot transfers: Supervision, Min guard       General transfer comment: slightly labored movement, increased time    Ambulation/Gait Ambulation/Gait assistance: Supervision, Min guard Gait Distance (Feet): 80 Feet Assistive device: Rolling walker (2 wheels) Gait Pattern/deviations: Decreased step length - right, Decreased step length - left, Decreased stride length Gait velocity: decreased     General Gait Details: slightly labored cadence without loss of balance with good return for ambulation in room and hallways without loss of balance  Stairs            Wheelchair Mobility    Modified Rankin (Stroke Patients Only)       Balance Overall balance assessment: Needs assistance Sitting-balance support: Feet supported, No upper extremity supported Sitting balance-Leahy Scale: Good Sitting balance - Comments: good seated at EOB   Standing balance support: During functional activity, No upper extremity supported Standing balance-Leahy Scale: Poor Standing balance comment: fair/poor without AD, fair/good using RW                             Pertinent Vitals/Pain Pain Assessment Pain Assessment: No/denies pain    Home Living Family/patient expects to be discharged to:: Private residence Living Arrangements: Alone Available Help at Discharge: Family;Available  PRN/intermittently Type of Home: House Home Access: Ramped entrance       Home Layout: One level Home Equipment: Conservation officer, nature (2 wheels);Cane - single point;Transport chair      Prior Function Prior Level of Function : Needs assist       Physical Assist : Mobility (physical);ADLs (physical) Mobility (physical): Bed mobility;Transfers;Gait;Stairs   Mobility Comments: household ambulator using RW ADLs Comments: assisted by family     Hand Dominance        Extremity/Trunk Assessment   Upper Extremity Assessment Upper Extremity Assessment: Overall WFL for tasks assessed    Lower Extremity Assessment Lower Extremity Assessment: Generalized weakness    Cervical / Trunk Assessment Cervical / Trunk Assessment: Normal  Communication   Communication: HOH  Cognition Arousal/Alertness: Awake/alert Behavior During Therapy: WFL for tasks assessed/performed Overall Cognitive Status: Within Functional Limits for tasks assessed                                          General Comments  Exercises     Assessment/Plan    PT Assessment Patient needs continued PT services  PT Problem List Decreased strength;Decreased activity tolerance;Decreased balance;Decreased mobility       PT Treatment Interventions DME instruction;Gait training;Stair training;Functional mobility training;Therapeutic activities;Therapeutic exercise;Balance training;Patient/family education    PT Goals (Current goals can be found in the Care Plan section)  Acute Rehab PT Goals Patient Stated Goal: return home with family to assist PT Goal Formulation: With patient/family Time For Goal Achievement: 05/04/22 Potential to Achieve Goals: Good    Frequency Min 3X/week     Co-evaluation               AM-PAC PT "6 Clicks" Mobility  Outcome Measure Help needed turning from your back to your side while in a flat bed without using bedrails?: None Help needed moving from  lying on your back to sitting on the side of a flat bed without using bedrails?: None Help needed moving to and from a bed to a chair (including a wheelchair)?: A Little Help needed standing up from a chair using your arms (e.g., wheelchair or bedside chair)?: A Little Help needed to walk in hospital room?: A Little Help needed climbing 3-5 steps with a railing? : A Little 6 Click Score: 20    End of Session   Activity Tolerance: Patient tolerated treatment well;Patient limited by fatigue Patient left: in chair;with call bell/phone within reach;with family/visitor present Nurse Communication: Mobility status PT Visit Diagnosis: Unsteadiness on feet (R26.81);Other abnormalities of gait and mobility (R26.89);Muscle weakness (generalized) (M62.81)    Time: 1031-5945 PT Time Calculation (min) (ACUTE ONLY): 23 min   Charges:   PT Evaluation $PT Eval Moderate Complexity: 1 Mod PT Treatments $Therapeutic Activity: 23-37 mins        11:51 AM, 05/02/22 Lonell Grandchild, MPT Physical Therapist with [facility] 336 [phone] office [phone] mobile phone

## 2022-05-03 ENCOUNTER — Inpatient Hospital Stay (HOSPITAL_COMMUNITY): Payer: Medicare Other

## 2022-05-03 DIAGNOSIS — I10 Essential (primary) hypertension: Secondary | ICD-10-CM | POA: Diagnosis not present

## 2022-05-03 DIAGNOSIS — E538 Deficiency of other specified B group vitamins: Secondary | ICD-10-CM

## 2022-05-03 DIAGNOSIS — G9341 Metabolic encephalopathy: Secondary | ICD-10-CM | POA: Diagnosis not present

## 2022-05-03 DIAGNOSIS — D638 Anemia in other chronic diseases classified elsewhere: Secondary | ICD-10-CM

## 2022-05-03 DIAGNOSIS — I480 Paroxysmal atrial fibrillation: Secondary | ICD-10-CM | POA: Diagnosis not present

## 2022-05-03 DIAGNOSIS — E871 Hypo-osmolality and hyponatremia: Secondary | ICD-10-CM | POA: Diagnosis not present

## 2022-05-03 LAB — VITAMIN B12: Vitamin B-12: 221 pg/mL (ref 180–914)

## 2022-05-03 LAB — BASIC METABOLIC PANEL
Anion gap: 8 (ref 5–15)
BUN: 12 mg/dL (ref 8–23)
CO2: 22 mmol/L (ref 22–32)
Calcium: 8.6 mg/dL — ABNORMAL LOW (ref 8.9–10.3)
Chloride: 102 mmol/L (ref 98–111)
Creatinine, Ser: 0.53 mg/dL (ref 0.44–1.00)
GFR, Estimated: >60 mL/min (ref >60)
Glucose, Bld: 153 mg/dL — ABNORMAL HIGH (ref 70–99)
Potassium: 3.8 mmol/L (ref 3.5–5.1)
Sodium: 132 mmol/L — ABNORMAL LOW (ref 135–145)

## 2022-05-03 LAB — AMMONIA: Ammonia: 20 umol/L (ref 9–35)

## 2022-05-03 LAB — MAGNESIUM: Magnesium: 1.9 mg/dL (ref 1.7–2.4)

## 2022-05-03 MED ORDER — TAMSULOSIN HCL 0.4 MG PO CAPS
0.4000 mg | ORAL_CAPSULE | Freq: Every day | ORAL | Status: DC
Start: 2022-05-03 — End: 2022-05-04
  Administered 2022-05-03: 0.4 mg via ORAL
  Filled 2022-05-03: qty 1

## 2022-05-03 MED ORDER — CYANOCOBALAMIN 1000 MCG/ML IJ SOLN
1000.0000 ug | Freq: Once | INTRAMUSCULAR | Status: AC
  Administered 2022-05-03: 1000 ug via INTRAMUSCULAR
  Filled 2022-05-03: qty 1

## 2022-05-03 MED ORDER — CHLORHEXIDINE GLUCONATE CLOTH 2 % EX PADS
6.0000 | MEDICATED_PAD | Freq: Every day | CUTANEOUS | Status: DC
  Administered 2022-05-03 – 2022-05-04 (×2): 6 via TOPICAL

## 2022-05-03 MED ORDER — ACETAMINOPHEN 500 MG PO TABS
1000.0000 mg | ORAL_TABLET | Freq: Two times a day (BID) | ORAL | Status: DC
  Administered 2022-05-03 – 2022-05-04 (×2): 1000 mg via ORAL
  Filled 2022-05-03 (×2): qty 2

## 2022-05-03 MED ORDER — ACETAMINOPHEN 500 MG PO TABS: 1000.0000 mg | ORAL_TABLET | Freq: Two times a day (BID) | ORAL | Status: DC

## 2022-05-03 NOTE — Progress Notes (Signed)
Patient was given tylenol 650mg  for shoulder pain, went to follow up and patient was sleeping. No major Changes during this shift.

## 2022-05-03 NOTE — Progress Notes (Signed)
Physical Therapy Treatment Patient Details Name: [name] MRN: [id number] DOB: 07/17/1927 Today's Date: 05/03/2022   History of Present Illness [name] is a 88 y.o. female with medical history significant of atrial fibrillation, coronary artery disease, hyperlipidemia, diet-controlled diabetes mellitus presents to the ED with a chief complaint of altered mental status.  Daughter and daughter-in-law are at bedside and provides all the history.  Patient is pleasant, but does not recognize her family members at bedside, and does not answer when asked where we are or why she is here.  Family reports that they check on patient several times a day but she does live independently.  At 7 AM 1 family member went over to make sure she had breakfast and took her pills.  9:30 AM another family member spoke to her and she was at her baseline.  This started not being able to reach patient in the afternoon, and assumed that she had gone outside to sit in the sun.  When they still had not reached her 4:00 family member went over to check on her and found her sitting on the toilet.  They are unsure how many hours she was there.  She was too weak to stand up.  She was able to recognize her family member at that time, but had no idea why, or for how long she had been on the commode.  This is very similar to presentation she had 5 months ago to [facility] when she had a UTI, so they decided to bring her into the ER.  In hindsight they do notice that for the last 2 weeks she was little more confused than normal.  She cannot remember what she ate or if she ate a meal.  She was getting confused about her medications, and having trouble getting oriented after naps.  Family reports most the time she is very clear.  In the ER patient has become more confused.  Likely lack of sleep has something to do with that.  She was not able to remember her birth year, but could remember her birth day and month.  They report that this  is never happened to her before.  And when I examined her I asked her who the people were at bedside and she said her sister, which is incorrect and never could say their names or how they related to her.  This was upon waking her, which may have already said is a hard time for her to get oriented.  Family reports when they spoke with her yesterday and when they spoke with her in the morning she had no complaints.  To their knowledge she has had no fevers, chills, urinary frequency/urgency, or other symptoms.    PT Comments    Patient sitting up in chair on therapist arrival. She is pleasant and agreeable to therapy. Patient states she needs to go to the bathroom. Therapist assists with putting recliner legs down. Patient performs sit to stand with SBA; takes extra time; has to push up with Ue's. Patient ambulates with RW and  therapist SBA to bathroom. She is able to toilet with SBA/Supervision.  After toileting, patient is able to ambulate x 100 ft in the hallway with RW and SBA; she does need verbal cues to stay in the walker frame and demonstrates decreased gait speed but no LOB or path deviation noted. Patient will benefit from continued therapy services during the remainder of her hospital stay and at the next recommended venue  of care to address deficits and promote return to optimal function.     Recommendations for follow up therapy are one component of a multi-disciplinary discharge planning process, led by the attending physician.  Recommendations may be updated based on patient status, additional functional criteria and insurance authorization.  Follow Up Recommendations  Home health PT     Assistance Recommended at Discharge Set up Supervision/Assistance  Patient can return home with the following A little help with walking and/or transfers;A little help with bathing/dressing/bathroom;Help with stairs or ramp for entrance;Assistance with cooking/housework   Equipment Recommendations   None recommended by PT    Recommendations for Other Services       Precautions / Restrictions Precautions Precautions: Fall Restrictions Weight Bearing Restrictions: No     Mobility  Bed Mobility                    Transfers Overall transfer level: Needs assistance Equipment used: Rolling walker (2 wheels) Transfers: Sit to/from Stand Sit to Stand: Supervision           General transfer comment: needed A to put recliner into seated position; slight inceased time for sit to stand    Ambulation/Gait Ambulation/Gait assistance: Supervision, Min guard Gait Distance (Feet): 100 Feet Assistive device: Rolling walker (2 wheels) Gait Pattern/deviations: Decreased step length - right, Decreased step length - left, Decreased stride length, Trunk flexed Gait velocity: decreased     General Gait Details: needs verbal cues to stay in walker frame; no LOB; slight decreased gait speed   Stairs             Wheelchair Mobility    Modified Rankin (Stroke Patients Only)       Balance Overall balance assessment: Needs assistance Sitting-balance support: Feet supported, No upper extremity supported Sitting balance-Leahy Scale: Good Sitting balance - Comments: good seated at edge of chair   Standing balance support: Bilateral upper extremity supported, Reliant on assistive device for balance   Standing balance comment: good with RW                            Cognition Arousal/Alertness: Awake/alert Behavior During Therapy: WFL for tasks assessed/performed Overall Cognitive Status: Within Functional Limits for tasks assessed                                          Exercises Other Exercises Other Exercises: seated LAQ's, heel/toe raises, marching x 10 reps each    General Comments        Pertinent Vitals/Pain Pain Assessment Pain Assessment: No/denies pain    Home Living                          Prior  Function            PT Goals (current goals can now be found in the care plan section) Acute Rehab PT Goals Patient Stated Goal: return home with family to assist PT Goal Formulation: With patient/family Time For Goal Achievement: 05/04/22 Potential to Achieve Goals: Good Progress towards PT goals: Progressing toward goals    Frequency    Min 3X/week      PT Plan      Co-evaluation              AM-PAC PT "6 Clicks" Mobility  Outcome Measure  Help needed turning from your back to your side while in a flat bed without using bedrails?: None Help needed moving from lying on your back to sitting on the side of a flat bed without using bedrails?: None Help needed moving to and from a bed to a chair (including a wheelchair)?: A Little Help needed standing up from a chair using your arms (e.g., wheelchair or bedside chair)?: A Little Help needed to walk in hospital room?: A Little Help needed climbing 3-5 steps with a railing? : A Lot 6 Click Score: 19    End of Session   Activity Tolerance: Patient tolerated treatment well;Patient limited by fatigue Patient left: in chair;with call bell/phone within reach   PT Visit Diagnosis: Unsteadiness on feet (R26.81);Other abnormalities of gait and mobility (R26.89);Muscle weakness (generalized) (M62.81)     Time: 9373-4287 PT Time Calculation (min) (ACUTE ONLY): 16 min  Charges:                        5:31 PM, 05/03/22 Stein Windhorst Small Koehn Salehi MPT [place] [facility] [place] [phone]

## 2022-05-03 NOTE — Progress Notes (Signed)
PROGRESS NOTE    [name]  OYD:[id number] DOB: [birth year] DOA: 05/01/2022 PCP: Manon Hilding, MD   Brief Narrative:   88 y.o. female with medical history significant of atrial fibrillation, coronary artery disease, hyperlipidemia, diet-controlled diabetes mellitus presented with altered mental status.  On presentation, she had mild leukocytosis with sodium of 121.  UA was suggestive of UTI.  CT of the head was negative for acute findings.  COVID testing was negative.  She was started on IV fluids and Rocephin.  Assessment & Plan:   Possible acute metabolic encephalopathy -Possibly from a combination of hyponatremia and UTI.  There might be a component of undiagnosed dementia -CT of the head was negative for acute findings. -Mental status improving.  No need for MRI of brain at this time.  Might need outpatient neurology evaluation and follow-up -Fall precautions. -Will need home health PT. -Continue empiric folic acid supplementation  UTI: Present on admission Urinary retention -Currently on Rocephin.  Urine cultures negative so far. -Currently has Foley catheter.  DC Foley catheter.  Start Flomax.  If goes into urinary retention again, we will have to replace Foley catheter and patient will need outpatient follow-up with urology  Hyponatremia -Presented with sodium of 121.  Possibly from poor oral intake.  Treated with IV fluids and salt tablets.  Sodium 132 today.  DC IV fluids.  Repeat a.m. labs.  Will possibly discontinue salt tablets soon as well.  Possible vitamin B12 deficiency -B12 level on the lower limit of normal.  Start supplementation and will need supplementation on discharge as well.  Chronic left shoulder pain -Left shoulder x-ray suggest advanced glenohumeral degenerative changes with no acute findings. -Son and daughter confirm that patient has had chronic shoulder pain for many years.  Resume Tylenol 1 g twice a day.   Paroxysmal A-fib -Atenolol on hold  because of bradycardia.  Heart rate improved.  Resume atenolol.  Continue Eliquis.  Outpatient follow-up with cardiology.  Essential hypertension -Atenolol on hold.  Resume atenolol.  Hypomagnesemia -Resolved  Anemia of chronic disease -Hemoglobin stable.  Monitor intermittently   DVT prophylaxis: Eliquis Code Status: DNR Family Communication: Son and daughter on phone Disposition Plan: Status is: Inpatient Remains inpatient appropriate because: Of need for IV antibiotics.    Consultants: None  Procedures: None  Antimicrobials: Rocephin from 05/01/2022 onwards   Subjective: Patient seen and examined at bedside.  More awake and answers some questions.  Still slow to respond.  Complains of left shoulder pain.  No overnight fever or vomiting reported.  Objective: Vitals:   05/02/22 1623 05/02/22 2103 05/03/22 0438 05/03/22 0500  BP: 140/64 (!) 152/79 125/68   Pulse: 80 83 (!) 107   Resp: 18 18 18    Temp: 98.6 F (37 C) 98.1 F (36.7 C) 98.1 F (36.7 C)   TempSrc: Oral Oral    SpO2: 99% 99% 98%   Weight:    62 kg  Height:        Intake/Output Summary (Last 24 hours) at 05/03/2022 1121 Last data filed at 05/03/2022 0900 Gross per 24 hour  Intake 2342.2 ml  Output 1950 ml  Net 392.2 ml   Filed Weights   05/01/22 1957 05/02/22 0808 05/03/22 0500  Weight: 63 kg 61.8 kg 62 kg    Examination:  General exam: Appears calm and comfortable.  Currently on room air.  Elderly female lying in bed. Respiratory system: Bilateral decreased breath sounds at bases Cardiovascular system: S1 & S2 heard, Rate controlled  Gastrointestinal system: Abdomen is nondistended, soft and nontender. Normal bowel sounds heard. Extremities: No cyanosis, clubbing, edema  Central nervous system: Awake, much more responsive and answers some questions appropriately.  No focal neurological deficits. Moving extremities Skin: No rashes, lesions or ulcers Psychiatry: Flat affect.  No signs of  agitation.    Data Reviewed: I have personally reviewed following labs and imaging studies  CBC: Recent Labs  Lab 05/01/22 2020 05/02/22 0339  WBC 11.3* 9.9  NEUTROABS 9.5* 8.4*  HGB 11.1* 10.5*  HCT 33.0* 31.6*  MCV 91.4 92.1  PLT 290 542   Basic Metabolic Panel: Recent Labs  Lab 05/01/22 2020 05/02/22 0129 05/02/22 0339 05/03/22 0447  NA 121* 124* 126* 132*  K 4.3 3.5 3.6 3.8  CL 93* 94* 96* 102  CO2 21* 20* 21* 22  GLUCOSE 135* 151* 129* 153*  BUN 19 16 16 12   CREATININE 0.53 0.53 0.54 0.53  CALCIUM 8.8* 8.2* 8.5* 8.6*  MG  --   --  1.3* 1.9   GFR: Estimated Creatinine Clearance: 36.3 mL/min (by C-G formula based on SCr of 0.53 mg/dL). Liver Function Tests: Recent Labs  Lab 05/01/22 2020 05/02/22 0339  AST 23 22  ALT 12 14  ALKPHOS 38 35*  BILITOT 1.2 1.2  PROT 6.9 6.1*  ALBUMIN 3.9 3.5   No results for input(s): "LIPASE", "AMYLASE" in the last 168 hours. Recent Labs  Lab 05/03/22 0447  AMMONIA 20   Coagulation Profile: No results for input(s): "INR", "PROTIME" in the last 168 hours. Cardiac Enzymes: No results for input(s): "CKTOTAL", "CKMB", "CKMBINDEX", "TROPONINI" in the last 168 hours. BNP (last 3 results) No results for input(s): "PROBNP" in the last 8760 hours. HbA1C: No results for input(s): "HGBA1C" in the last 72 hours. CBG: No results for input(s): "GLUCAP" in the last 168 hours. Lipid Profile: No results for input(s): "CHOL", "HDL", "LDLCALC", "TRIG", "CHOLHDL", "LDLDIRECT" in the last 72 hours. Thyroid Function Tests: Recent Labs    05/02/22 0339  TSH 1.430   Anemia Panel: Recent Labs    05/03/22 0447  VITAMINB12 221   Sepsis Labs: No results for input(s): "PROCALCITON", "LATICACIDVEN" in the last 168 hours.  Recent Results (from the past 240 hour(s))  SARS Coronavirus 2 by RT PCR (hospital order, performed in [facility] hospital lab) *cepheid single result test* Anterior Nasal Swab     Status: None   Collection  Time: 05/01/22  8:18 PM   Specimen: Anterior Nasal Swab  Result Value Ref Range Status   SARS Coronavirus 2 by RT PCR NEGATIVE NEGATIVE Final    Comment: (NOTE) SARS-CoV-2 target nucleic acids are NOT DETECTED.  The SARS-CoV-2 RNA is generally detectable in upper and lower respiratory specimens during the acute phase of infection. The lowest concentration of SARS-CoV-2 viral copies this assay can detect is 250 copies / mL. A negative result does not preclude SARS-CoV-2 infection and should not be used as the sole basis for treatment or other patient management decisions.  A negative result may occur with improper specimen collection / handling, submission of specimen other than nasopharyngeal swab, presence of viral mutation(s) within the areas targeted by this assay, and inadequate number of viral copies (<250 copies / mL). A negative result must be combined with clinical observations, patient history, and epidemiological information.  Fact Sheet for Patients:   https://www.patel.info/  Fact Sheet for Healthcare Providers: https://hall.com/  This test is not yet approved or  cleared by the [place] FDA and has been  authorized for detection and/or diagnosis of SARS-CoV-2 by FDA under an Emergency Use Authorization (EUA).  This EUA will remain in effect (meaning this test can be used) for the duration of the COVID-19 declaration under Section 564(b)(1) of the Act, 21 U.S.C. section 360bbb-3(b)(1), unless the authorization is terminated or revoked sooner.  Performed at [facility], [address] [place], [place] [address]          Radiology Studies: DG Shoulder Left  Result Date: 05/03/2022 CLINICAL DATA:  Left shoulder pain. EXAM: LEFT SHOULDER - 2+ VIEW COMPARISON:  None Available. FINDINGS: Advanced glenohumeral joint degenerative changes with joint space narrowing, osteophytic spurring subchondral cystic change. Moderate  AC joint degenerative changes are also noted. Narrowing of the humeroacromial space and soft tissue calcifications suggesting rotator cuff disease. IMPRESSION: 1. Advanced glenohumeral joint degenerative changes and moderate AC joint degenerative changes. 2. No acute bony findings. 3. Probable rotator cuff disease. Electronically Signed   By: Marijo Sanes M.D.   On: 05/03/2022 09:57   CT Head Wo Contrast  Result Date: 05/01/2022 CLINICAL DATA:  Altered mental status EXAM: CT HEAD WITHOUT CONTRAST TECHNIQUE: Contiguous axial images were obtained from the base of the skull through the vertex without intravenous contrast. RADIATION DOSE REDUCTION: This exam was performed according to the departmental dose-optimization program which includes automated exposure control, adjustment of the mA and/or kV according to patient size and/or use of iterative reconstruction technique. COMPARISON:  08/15/2021 FINDINGS: Brain: There is atrophy and chronic small vessel disease changes. No acute intracranial abnormality. Specifically, no hemorrhage, hydrocephalus, mass lesion, acute infarction, or significant intracranial injury. Vascular: No hyperdense vessel or unexpected calcification. Skull: No acute calvarial abnormality. Sinuses/Orbits: No acute finding Other: None IMPRESSION: Atrophy, chronic microvascular disease. No acute intracranial abnormality. Electronically Signed   By: Rolm Baptise M.D.   On: 05/01/2022 20:37        Scheduled Meds:  acetaminophen  1,000 mg Oral BID   apixaban  5 mg Oral BID   Chlorhexidine Gluconate Cloth  6 each Topical Daily   folic acid  1 mg Oral Daily   simvastatin  10 mg Oral QHS   sodium chloride  1 g Oral TID WC   Continuous Infusions:  cefTRIAXone (ROCEPHIN)  IV 1 g (05/02/22 2054)          Aline August, MD [facility] 05/03/2022, 11:21 AM

## 2022-05-04 DIAGNOSIS — G9341 Metabolic encephalopathy: Secondary | ICD-10-CM | POA: Diagnosis not present

## 2022-05-04 DIAGNOSIS — E538 Deficiency of other specified B group vitamins: Secondary | ICD-10-CM | POA: Diagnosis not present

## 2022-05-04 DIAGNOSIS — I10 Essential (primary) hypertension: Secondary | ICD-10-CM | POA: Diagnosis not present

## 2022-05-04 DIAGNOSIS — D638 Anemia in other chronic diseases classified elsewhere: Secondary | ICD-10-CM | POA: Diagnosis not present

## 2022-05-04 LAB — BASIC METABOLIC PANEL
Anion gap: 7 (ref 5–15)
BUN: 12 mg/dL (ref 8–23)
CO2: 22 mmol/L (ref 22–32)
Calcium: 8.8 mg/dL — ABNORMAL LOW (ref 8.9–10.3)
Chloride: 103 mmol/L (ref 98–111)
Creatinine, Ser: 0.49 mg/dL (ref 0.44–1.00)
GFR, Estimated: >60 mL/min (ref >60)
Glucose, Bld: 145 mg/dL — ABNORMAL HIGH (ref 70–99)
Potassium: 3.7 mmol/L (ref 3.5–5.1)
Sodium: 132 mmol/L — ABNORMAL LOW (ref 135–145)

## 2022-05-04 LAB — URINE CULTURE: Culture: >=100000 — AB

## 2022-05-04 LAB — MAGNESIUM: Magnesium: 1.7 mg/dL (ref 1.7–2.4)

## 2022-05-04 MED ORDER — FOLIC ACID 1 MG PO TABS: 1.0000 mg | ORAL_TABLET | Freq: Every day | ORAL | 0 refills | Status: DC

## 2022-05-04 MED ORDER — TAMSULOSIN HCL 0.4 MG PO CAPS: 0.4000 mg | ORAL_CAPSULE | Freq: Every day | ORAL | 0 refills | Status: DC

## 2022-05-04 MED ORDER — SODIUM CHLORIDE 1 G PO TABS: 1.0000 g | ORAL_TABLET | Freq: Three times a day (TID) | ORAL | 0 refills | Status: DC

## 2022-05-04 MED ORDER — CEPHALEXIN 500 MG PO CAPS: 500.0000 mg | ORAL_CAPSULE | Freq: Two times a day (BID) | ORAL | 0 refills | Status: AC

## 2022-05-04 MED ORDER — VITAMIN B-12 1000 MCG PO TABS: 1000.0000 ug | ORAL_TABLET | Freq: Every day | ORAL | 0 refills | Status: DC

## 2022-05-04 NOTE — Progress Notes (Signed)
Patient discharged home with medication instructions and follow up appointments, IV removed from LFA , Patient transferred by w/c to family personal vehicle ,

## 2022-05-04 NOTE — Discharge Summary (Signed)
Physician Discharge Summary  [name]:[id number] DOB: 10/14/1927 DOA: 05/01/2022  PCP: Manon Hilding, MD  Admit date: 05/01/2022 Discharge date: 05/04/2022  Admitted From: Home Disposition: Home  Recommendations for Outpatient Follow-up:  Follow up with PCP in 1 week with repeat CBC/BMP Recommend outpatient evaluation and follow-up by neurology Follow up in ED if symptoms worsen or new appear   Home Health: No Equipment/Devices: None  Discharge Condition: Stable CODE STATUS: Full Diet recommendation: Heart healthy  Brief/Interim Summary:  88 y.o. female with medical history significant of atrial fibrillation, coronary artery disease, hyperlipidemia, diet-controlled diabetes mellitus presented with altered mental status.  On presentation, she had mild leukocytosis with sodium of 121.  UA was suggestive of UTI.  CT of the head was negative for acute findings.  COVID testing was negative.  She was started on IV fluids and Rocephin.  During the hospitalization, her condition and mental status has much improved.  She has been started on vitamin B12 supplementation.  Urine culture grew Klebsiella pneumoniae.  Sodium level has also improved.  She has tolerated physical therapy well.  She will be discharged home today on oral Keflex for few more days.  Outpatient follow-up with PCP.  Might need neurology evaluation and follow-up.  Discharge Diagnoses:   Possible acute metabolic encephalopathy -Possibly from a combination of hyponatremia and UTI.  There might be a component of undiagnosed dementia -CT of the head was negative for acute findings. -Mental status improving.  No need for MRI of brain at this time.  Might need outpatient neurology evaluation and follow-up -Fall precautions. -Will need home health PT. -Continue empiric folic acid supplementation   UTI: Present on admission Urinary retention -Currently on Rocephin.  Urine cultures grew Klebsiella pneumonia.  Discharged  home on oral Keflex for 5 more days. -Foley catheter placed in the ED which has been subsequently discontinued.  She is urinating well.     Hyponatremia -Presented with sodium of 121.  Possibly from poor oral intake.  Treated with IV fluids and salt tablets.  Sodium 132 today.  DC'd IV fluids.  Will discharge home on oral salt tablets for a week.  Outpatient follow-up with PCP.    Possible vitamin B12 deficiency -B12 level on the lower limit of normal.  Started supplementation and will need supplementation on discharge as well.   Chronic left shoulder pain -Left shoulder x-ray suggest advanced glenohumeral degenerative changes with no acute findings. -Son and daughter confirm that patient has had chronic shoulder pain for many years.  Resumed Tylenol 1 g twice a day.     Paroxysmal A-fib -Atenolol to be resumed on discharge.  Continue Eliquis.  Outpatient follow-up with cardiology.   Essential hypertension -Resume atenolol.   Hypomagnesemia -Resolved   Anemia of chronic disease -Hemoglobin stable.  Monitor intermittently   Discharge Instructions  Discharge Instructions     Ambulatory referral to Neurology   Complete by: As directed    An appointment is requested in approximately: 2 weeks   Ambulatory referral to Physical Therapy   Complete by: As directed    [facility]/ Home/ Mobile   Diet general   Complete by: As directed    Increase activity slowly   Complete by: As directed       Allergies as of 05/04/2022       Reactions   Toprol Xl [metoprolol Succinate] Other (See Comments)   HAIR LOSS        Medication List     STOP  taking these medications    Calcium Carbonate-Vitamin D 600-400 MG-UNIT tablet   cefUROXime 500 MG tablet Commonly known as: CEFTIN   docusate sodium 100 MG capsule Commonly known as: COLACE       TAKE these medications    acetaminophen 500 MG tablet Commonly known as: TYLENOL Take 1,000 mg by mouth 2 (two) times  daily.   atenolol 50 MG tablet Commonly known as: TENORMIN TAKE 1 TABLET BY MOUTH TWICE A DAY   cephALEXin 500 MG capsule Commonly known as: KEFLEX Take 1 capsule (500 mg total) by mouth 2 (two) times daily for 5 days.   Eliquis 5 MG Tabs tablet Generic drug: apixaban Take 1 tablet (5 mg total) by mouth 2 (two) times daily.   folic acid 1 MG tablet Commonly known as: FOLVITE Take 1 tablet (1 mg total) by mouth daily.   meclizine 25 MG tablet Commonly known as: ANTIVERT Take 1 tablet by mouth 3 (three) times daily as needed.   meloxicam 7.5 MG tablet Commonly known as: MOBIC Take 7.5 mg by mouth every other day.   metFORMIN 500 MG 24 hr tablet Commonly known as: GLUCOPHAGE-XR Take 2 tabs (1,000mg ) by mouth every morning & 1 tab (500mg ) every evening   multivitamin tablet Take 1 tablet by mouth daily.   multivitamin-lutein Caps capsule Take 1 capsule by mouth 2 (two) times daily.   nitroGLYCERIN 0.4 MG SL tablet Commonly known as: NITROSTAT Place 1 tablet (0.4 mg total) under the tongue every 5 (five) minutes x 3 doses as needed for chest pain (if no relief after 3rd dose, proceed to the ED for an evaluation or call 911).   simvastatin 10 MG tablet Commonly known as: ZOCOR Take 10 mg by mouth at bedtime.   sodium chloride 1 g tablet Take 1 tablet (1 g total) by mouth 3 (three) times daily with meals.   tamsulosin 0.4 MG Caps capsule Commonly known as: FLOMAX Take 1 capsule (0.4 mg total) by mouth daily after supper.   vitamin B-12 1000 MCG tablet Commonly known as: CYANOCOBALAMIN Take 1 tablet (1,000 mcg total) by mouth daily.        Follow-up Information     Sasser, Silvestre Moment, MD. Schedule an appointment as soon as possible for a visit in 1 week(s).   Specialty: Family Medicine Why: with bmp Contact information: [place] [address] [phone]         Satira Sark, MD .   Specialty: Cardiology Contact information: [address] [place] [address] [phone]                Allergies  Allergen Reactions   Toprol Xl [Metoprolol Succinate] Other (See Comments)    HAIR LOSS    Consultations: None   Procedures/Studies: DG Shoulder Left  Result Date: 05/03/2022 CLINICAL DATA:  Left shoulder pain. EXAM: LEFT SHOULDER - 2+ VIEW COMPARISON:  None Available. FINDINGS: Advanced glenohumeral joint degenerative changes with joint space narrowing, osteophytic spurring subchondral cystic change. Moderate AC joint degenerative changes are also noted. Narrowing of the humeroacromial space and soft tissue calcifications suggesting rotator cuff disease. IMPRESSION: 1. Advanced glenohumeral joint degenerative changes and moderate AC joint degenerative changes. 2. No acute bony findings. 3. Probable rotator cuff disease. Electronically Signed   By: Marijo Sanes M.D.   On: 05/03/2022 09:57   CT Head Wo Contrast  Result Date: 05/01/2022 CLINICAL DATA:  Altered mental status EXAM: CT HEAD WITHOUT  CONTRAST TECHNIQUE: Contiguous axial images were obtained from the base of the skull through the vertex without intravenous contrast. RADIATION DOSE REDUCTION: This exam was performed according to the departmental dose-optimization program which includes automated exposure control, adjustment of the mA and/or kV according to patient size and/or use of iterative reconstruction technique. COMPARISON:  08/15/2021 FINDINGS: Brain: There is atrophy and chronic small vessel disease changes. No acute intracranial abnormality. Specifically, no hemorrhage, hydrocephalus, mass lesion, acute infarction, or significant intracranial injury. Vascular: No hyperdense vessel or unexpected calcification. Skull: No acute calvarial abnormality. Sinuses/Orbits: No acute finding Other: None IMPRESSION: Atrophy, chronic microvascular disease. No acute intracranial abnormality. Electronically Signed   By: Rolm Baptise M.D.   On: 05/01/2022 20:37       Subjective: Patient seen and examined at bedside.  Poor historian.  Son and daughter-in-law at bedside.  No overnight fever, vomiting, agitation reported.  Mental status almost close to baseline as per son.  Discharge Exam: Vitals:   05/03/22 2154 05/04/22 0500  BP: 112/74 119/65  Pulse: 86 (!) 106  Resp: 17 17  Temp: 98.1 F (36.7 C) 97.8 F (36.6 C)  SpO2: 96% 97%    General: Pt is alert, awake, not in acute distress.  Elderly female sitting on chair.  Currently on room air.  Still slow to respond but answers some questions appropriately.   Cardiovascular: Intermittent tachycardia present, S1/S2 + Respiratory: bilateral decreased breath sounds at bases Abdominal: Soft, NT, ND, bowel sounds + Extremities: no edema, no cyanosis    The results of significant diagnostics from this hospitalization (including imaging, microbiology, ancillary and laboratory) are listed below for reference.     Microbiology: Recent Results (from the past 240 hour(s))  SARS Coronavirus 2 by RT PCR (hospital order, performed in [facility] hospital lab) *cepheid single result test* Anterior Nasal Swab     Status: None   Collection Time: 05/01/22  8:18 PM   Specimen: Anterior Nasal Swab  Result Value Ref Range Status   SARS Coronavirus 2 by RT PCR NEGATIVE NEGATIVE Final    Comment: (NOTE) SARS-CoV-2 target nucleic acids are NOT DETECTED.  The SARS-CoV-2 RNA is generally detectable in upper and lower respiratory specimens during the acute phase of infection. The lowest concentration of SARS-CoV-2 viral copies this assay can detect is 250 copies / mL. A negative result does not preclude SARS-CoV-2 infection and should not be used as the sole basis for treatment or other patient management decisions.  A negative result may occur with improper specimen collection / handling, submission of specimen other than nasopharyngeal swab, presence of viral mutation(s) within the areas targeted by this  assay, and inadequate number of viral copies (<250 copies / mL). A negative result must be combined with clinical observations, patient history, and epidemiological information.  Fact Sheet for Patients:   https://www.patel.info/  Fact Sheet for Healthcare Providers: https://hall.com/  This test is not yet approved or  cleared by the [place] FDA and has been authorized for detection and/or diagnosis of SARS-CoV-2 by FDA under an Emergency Use Authorization (EUA).  This EUA will remain in effect (meaning this test can be used) for the duration of the COVID-19 declaration under Section 564(b)(1) of the Act, 21 U.S.C. section 360bbb-3(b)(1), unless the authorization is terminated or revoked sooner.  Performed at [facility], [address] [place], [place] [address]   Urine Culture     Status: Abnormal (Preliminary result)   Collection Time: 05/01/22 10:18 PM   Specimen: Urine,  Catheterized  Result Value Ref Range Status   Specimen Description   Final    URINE, CATHETERIZED Performed at [facility], [address] [place], [place] [address]    Special Requests   Final    NONE Performed at [facility], [address], [place], [place] [address]    Culture >=100,000 COLONIES/mL KLEBSIELLA PNEUMONIAE (A)  Final   Report Status PENDING  Incomplete   Organism ID, Bacteria KLEBSIELLA PNEUMONIAE (A)  Final      Susceptibility   Klebsiella pneumoniae - MIC*    AMPICILLIN RESISTANT Resistant     CEFAZOLIN <=4 SENSITIVE Sensitive     CEFEPIME <=0.12 SENSITIVE Sensitive     CEFTRIAXONE <=0.25 SENSITIVE Sensitive     CIPROFLOXACIN <=0.25 SENSITIVE Sensitive     GENTAMICIN <=1 SENSITIVE Sensitive     IMIPENEM <=0.25 SENSITIVE Sensitive     NITROFURANTOIN 32 SENSITIVE Sensitive     TRIMETH/SULFA <=20 SENSITIVE Sensitive     AMPICILLIN/SULBACTAM 4 SENSITIVE Sensitive     PIP/TAZO <=4 SENSITIVE Sensitive     * >=100,000 COLONIES/mL  KLEBSIELLA PNEUMONIAE     Labs: BNP (last 3 results) No results for input(s): "BNP" in the last 8760 hours. Basic Metabolic Panel: Recent Labs  Lab 05/01/22 2020 05/02/22 0129 05/02/22 0339 05/03/22 0447 05/04/22 0427  NA 121* 124* 126* 132* 132*  K 4.3 3.5 3.6 3.8 3.7  CL 93* 94* 96* 102 103  CO2 21* 20* 21* 22 22  GLUCOSE 135* 151* 129* 153* 145*  BUN 19 16 16 12 12   CREATININE 0.53 0.53 0.54 0.53 0.49  CALCIUM 8.8* 8.2* 8.5* 8.6* 8.8*  MG  --   --  1.3* 1.9 1.7   Liver Function Tests: Recent Labs  Lab 05/01/22 2020 05/02/22 0339  AST 23 22  ALT 12 14  ALKPHOS 38 35*  BILITOT 1.2 1.2  PROT 6.9 6.1*  ALBUMIN 3.9 3.5   No results for input(s): "LIPASE", "AMYLASE" in the last 168 hours. Recent Labs  Lab 05/03/22 0447  AMMONIA 20   CBC: Recent Labs  Lab 05/01/22 2020 05/02/22 0339  WBC 11.3* 9.9  NEUTROABS 9.5* 8.4*  HGB 11.1* 10.5*  HCT 33.0* 31.6*  MCV 91.4 92.1  PLT 290 267   Cardiac Enzymes: No results for input(s): "CKTOTAL", "CKMB", "CKMBINDEX", "TROPONINI" in the last 168 hours. BNP: Invalid input(s): "POCBNP" CBG: No results for input(s): "GLUCAP" in the last 168 hours. D-Dimer No results for input(s): "DDIMER" in the last 72 hours. Hgb A1c No results for input(s): "HGBA1C" in the last 72 hours. Lipid Profile No results for input(s): "CHOL", "HDL", "LDLCALC", "TRIG", "CHOLHDL", "LDLDIRECT" in the last 72 hours. Thyroid function studies Recent Labs    05/02/22 0339  TSH 1.430   Anemia work up Recent Labs    05/03/22 0447  VITAMINB12 221   Urinalysis    Component Value Date/Time   COLORURINE YELLOW 05/01/2022 2140   APPEARANCEUR HAZY (A) 05/01/2022 2140   LABSPEC 1.011 05/01/2022 2140   PHURINE 6.0 05/01/2022 2140   GLUCOSEU NEGATIVE 05/01/2022 2140   HGBUR NEGATIVE 05/01/2022 2140   BILIRUBINUR NEGATIVE 05/01/2022 2140   KETONESUR 20 (A) 05/01/2022 2140   PROTEINUR 100 (A) 05/01/2022 2140   NITRITE POSITIVE (A) 05/01/2022  2140   LEUKOCYTESUR MODERATE (A) 05/01/2022 2140   Sepsis Labs Recent Labs  Lab 05/01/22 2020 05/02/22 0339  WBC 11.3* 9.9   Microbiology Recent Results (from the past 240 hour(s))  SARS Coronavirus 2 by RT PCR (  hospital order, performed in [facility] hospital lab) *cepheid single result test* Anterior Nasal Swab     Status: None   Collection Time: 05/01/22  8:18 PM   Specimen: Anterior Nasal Swab  Result Value Ref Range Status   SARS Coronavirus 2 by RT PCR NEGATIVE NEGATIVE Final    Comment: (NOTE) SARS-CoV-2 target nucleic acids are NOT DETECTED.  The SARS-CoV-2 RNA is generally detectable in upper and lower respiratory specimens during the acute phase of infection. The lowest concentration of SARS-CoV-2 viral copies this assay can detect is 250 copies / mL. A negative result does not preclude SARS-CoV-2 infection and should not be used as the sole basis for treatment or other patient management decisions.  A negative result may occur with improper specimen collection / handling, submission of specimen other than nasopharyngeal swab, presence of viral mutation(s) within the areas targeted by this assay, and inadequate number of viral copies (<250 copies / mL). A negative result must be combined with clinical observations, patient history, and epidemiological information.  Fact Sheet for Patients:   https://www.patel.info/  Fact Sheet for Healthcare Providers: https://hall.com/  This test is not yet approved or  cleared by the [place] FDA and has been authorized for detection and/or diagnosis of SARS-CoV-2 by FDA under an Emergency Use Authorization (EUA).  This EUA will remain in effect (meaning this test can be used) for the duration of the COVID-19 declaration under Section 564(b)(1) of the Act, 21 U.S.C. section 360bbb-3(b)(1), unless the authorization is terminated or revoked sooner.  Performed at [facility], [address], [place], [place] [address]   Urine Culture     Status: Abnormal (Preliminary result)   Collection Time: 05/01/22 10:18 PM   Specimen: Urine, Catheterized  Result Value Ref Range Status   Specimen Description   Final    URINE, CATHETERIZED Performed at [facility], [address], [place], [place] [address]    Special Requests   Final    NONE Performed at [facility], [address], [place], [place] [address]    Culture >=100,000 COLONIES/mL KLEBSIELLA PNEUMONIAE (A)  Final   Report Status PENDING  Incomplete   Organism ID, Bacteria KLEBSIELLA PNEUMONIAE (A)  Final      Susceptibility   Klebsiella pneumoniae - MIC*    AMPICILLIN RESISTANT Resistant     CEFAZOLIN <=4 SENSITIVE Sensitive     CEFEPIME <=0.12 SENSITIVE Sensitive     CEFTRIAXONE <=0.25 SENSITIVE Sensitive     CIPROFLOXACIN <=0.25 SENSITIVE Sensitive     GENTAMICIN <=1 SENSITIVE Sensitive     IMIPENEM <=0.25 SENSITIVE Sensitive     NITROFURANTOIN 32 SENSITIVE Sensitive     TRIMETH/SULFA <=20 SENSITIVE Sensitive     AMPICILLIN/SULBACTAM 4 SENSITIVE Sensitive     PIP/TAZO <=4 SENSITIVE Sensitive     * >=100,000 COLONIES/mL KLEBSIELLA PNEUMONIAE     Time coordinating discharge: 35 minutes  SIGNED:   Aline August, MD  [facility] 05/04/2022, 11:49 AM

## 2022-05-10 DIAGNOSIS — D529 Folate deficiency anemia, unspecified: Secondary | ICD-10-CM | POA: Diagnosis not present

## 2022-05-10 DIAGNOSIS — Z6829 Body mass index (BMI) 29.0-29.9, adult: Secondary | ICD-10-CM | POA: Diagnosis not present

## 2022-05-10 DIAGNOSIS — D649 Anemia, unspecified: Secondary | ICD-10-CM | POA: Diagnosis not present

## 2022-05-10 DIAGNOSIS — N3090 Cystitis, unspecified without hematuria: Secondary | ICD-10-CM | POA: Diagnosis not present

## 2022-05-10 DIAGNOSIS — E871 Hypo-osmolality and hyponatremia: Secondary | ICD-10-CM | POA: Diagnosis not present

## 2022-05-10 DIAGNOSIS — D519 Vitamin B12 deficiency anemia, unspecified: Secondary | ICD-10-CM | POA: Diagnosis not present

## 2022-05-10 DIAGNOSIS — N183 Chronic kidney disease, stage 3 unspecified: Secondary | ICD-10-CM | POA: Diagnosis not present

## 2022-05-10 DIAGNOSIS — R339 Retention of urine, unspecified: Secondary | ICD-10-CM | POA: Diagnosis not present

## 2022-05-10 DIAGNOSIS — R410 Disorientation, unspecified: Secondary | ICD-10-CM | POA: Diagnosis not present

## 2022-05-16 DIAGNOSIS — R300 Dysuria: Secondary | ICD-10-CM | POA: Diagnosis not present

## 2022-06-03 DIAGNOSIS — N3090 Cystitis, unspecified without hematuria: Secondary | ICD-10-CM | POA: Diagnosis not present

## 2022-06-03 DIAGNOSIS — E871 Hypo-osmolality and hyponatremia: Secondary | ICD-10-CM | POA: Diagnosis not present

## 2022-06-03 DIAGNOSIS — R410 Disorientation, unspecified: Secondary | ICD-10-CM | POA: Diagnosis not present

## 2022-06-03 DIAGNOSIS — D649 Anemia, unspecified: Secondary | ICD-10-CM | POA: Diagnosis not present

## 2022-06-03 DIAGNOSIS — N183 Chronic kidney disease, stage 3 unspecified: Secondary | ICD-10-CM | POA: Diagnosis not present

## 2022-06-03 DIAGNOSIS — R339 Retention of urine, unspecified: Secondary | ICD-10-CM | POA: Diagnosis not present

## 2022-06-04 ENCOUNTER — Ambulatory Visit: Payer: Medicare Other | Admitting: Psychiatry

## 2022-06-11 ENCOUNTER — Other Ambulatory Visit: Payer: Self-pay | Admitting: *Deleted

## 2022-06-11 MED ORDER — ELIQUIS 5 MG PO TABS: 5.0000 mg | ORAL_TABLET | Freq: Two times a day (BID) | ORAL | 5 refills | Status: DC

## 2022-06-11 NOTE — Telephone Encounter (Signed)
Prescription refill request for Eliquis received. Indication: Atrial Fib Last office visit: 03/07/22  B Strader PA-C Scr:  0.49 on 05/04/22 Age: 88 Weight: 62.3kg  Based on above findings Eliquis 5mg  twice daily is the appropriate dose.  Refill approved.

## 2022-06-29 DIAGNOSIS — N3090 Cystitis, unspecified without hematuria: Secondary | ICD-10-CM | POA: Diagnosis not present

## 2022-06-29 DIAGNOSIS — R4182 Altered mental status, unspecified: Secondary | ICD-10-CM | POA: Diagnosis not present

## 2022-06-29 DIAGNOSIS — R410 Disorientation, unspecified: Secondary | ICD-10-CM | POA: Diagnosis not present

## 2022-06-29 DIAGNOSIS — Z6830 Body mass index (BMI) 30.0-30.9, adult: Secondary | ICD-10-CM | POA: Diagnosis not present

## 2022-06-29 DIAGNOSIS — I4891 Unspecified atrial fibrillation: Secondary | ICD-10-CM | POA: Diagnosis not present

## 2022-07-06 ENCOUNTER — Encounter (HOSPITAL_COMMUNITY): Payer: Self-pay | Admitting: *Deleted

## 2022-07-06 ENCOUNTER — Emergency Department (HOSPITAL_COMMUNITY): Payer: Medicare Other

## 2022-07-06 ENCOUNTER — Other Ambulatory Visit: Payer: Self-pay

## 2022-07-06 ENCOUNTER — Observation Stay (HOSPITAL_COMMUNITY)
Admission: EM | Admit: 2022-07-06 | Discharge: 2022-07-07 | Disposition: A | Discharge status: Home / Self Care | Payer: Medicare Other | Attending: Internal Medicine | Admitting: Internal Medicine

## 2022-07-06 DIAGNOSIS — R4182 Altered mental status, unspecified: Secondary | ICD-10-CM | POA: Diagnosis not present

## 2022-07-06 DIAGNOSIS — E119 Type 2 diabetes mellitus without complications: Secondary | ICD-10-CM

## 2022-07-06 DIAGNOSIS — E871 Hypo-osmolality and hyponatremia: Secondary | ICD-10-CM | POA: Insufficient documentation

## 2022-07-06 DIAGNOSIS — I10 Essential (primary) hypertension: Secondary | ICD-10-CM | POA: Diagnosis not present

## 2022-07-06 DIAGNOSIS — I4891 Unspecified atrial fibrillation: Secondary | ICD-10-CM | POA: Diagnosis present

## 2022-07-06 DIAGNOSIS — I2510 Atherosclerotic heart disease of native coronary artery without angina pectoris: Secondary | ICD-10-CM | POA: Diagnosis not present

## 2022-07-06 DIAGNOSIS — R410 Disorientation, unspecified: Secondary | ICD-10-CM | POA: Diagnosis not present

## 2022-07-06 DIAGNOSIS — Z79899 Other long term (current) drug therapy: Secondary | ICD-10-CM | POA: Diagnosis not present

## 2022-07-06 DIAGNOSIS — E785 Hyperlipidemia, unspecified: Secondary | ICD-10-CM | POA: Diagnosis present

## 2022-07-06 DIAGNOSIS — Z96653 Presence of artificial knee joint, bilateral: Secondary | ICD-10-CM | POA: Diagnosis not present

## 2022-07-06 DIAGNOSIS — Z7984 Long term (current) use of oral hypoglycemic drugs: Secondary | ICD-10-CM | POA: Insufficient documentation

## 2022-07-06 DIAGNOSIS — Z8679 Personal history of other diseases of the circulatory system: Secondary | ICD-10-CM | POA: Insufficient documentation

## 2022-07-06 DIAGNOSIS — G9341 Metabolic encephalopathy: Secondary | ICD-10-CM | POA: Diagnosis not present

## 2022-07-06 DIAGNOSIS — I482 Chronic atrial fibrillation, unspecified: Secondary | ICD-10-CM | POA: Diagnosis present

## 2022-07-06 LAB — CBC WITH DIFFERENTIAL/PLATELET
Abs Immature Granulocytes: 0.02 10*3/uL (ref 0.00–0.07)
Basophils Absolute: 0 10*3/uL (ref 0.0–0.1)
Basophils Relative: 1 %
Eosinophils Absolute: 0.1 10*3/uL (ref 0.0–0.5)
Eosinophils Relative: 1 %
HCT: 36.1 % (ref 36.0–46.0)
Hemoglobin: 11.5 g/dL — ABNORMAL LOW (ref 12.0–15.0)
Immature Granulocytes: 0 %
Lymphocytes Relative: 9 %
Lymphs Abs: 0.8 10*3/uL (ref 0.7–4.0)
MCH: 30.1 pg (ref 26.0–34.0)
MCHC: 31.9 g/dL (ref 30.0–36.0)
MCV: 94.5 fL (ref 80.0–100.0)
Monocytes Absolute: 0.7 10*3/uL (ref 0.1–1.0)
Monocytes Relative: 8 %
Neutro Abs: 6.7 10*3/uL (ref 1.7–7.7)
Neutrophils Relative %: 81 %
Platelets: 292 10*3/uL (ref 150–400)
RBC: 3.82 MIL/uL — ABNORMAL LOW (ref 3.87–5.11)
RDW: 14.1 % (ref 11.5–15.5)
WBC: 8.2 10*3/uL (ref 4.0–10.5)
nRBC: 0 % (ref 0.0–0.2)

## 2022-07-06 LAB — URINALYSIS, ROUTINE W REFLEX MICROSCOPIC
Bilirubin Urine: NEGATIVE
Glucose, UA: NEGATIVE mg/dL
Ketones, ur: NEGATIVE mg/dL
Nitrite: NEGATIVE
Protein, ur: NEGATIVE mg/dL
Specific Gravity, Urine: 1.008 (ref 1.005–1.030)
pH: 7 (ref 5.0–8.0)

## 2022-07-06 LAB — COMPREHENSIVE METABOLIC PANEL
ALT: 12 U/L (ref 0–44)
AST: 17 U/L (ref 15–41)
Albumin: 3.8 g/dL (ref 3.5–5.0)
Alkaline Phosphatase: 43 U/L (ref 38–126)
Anion gap: 7 (ref 5–15)
BUN: 17 mg/dL (ref 8–23)
CO2: 25 mmol/L (ref 22–32)
Calcium: 8.9 mg/dL (ref 8.9–10.3)
Chloride: 95 mmol/L — ABNORMAL LOW (ref 98–111)
Creatinine, Ser: 0.55 mg/dL (ref 0.44–1.00)
GFR, Estimated: >60 mL/min (ref >60)
Glucose, Bld: 118 mg/dL — ABNORMAL HIGH (ref 70–99)
Potassium: 4.4 mmol/L (ref 3.5–5.1)
Sodium: 127 mmol/L — ABNORMAL LOW (ref 135–145)
Total Bilirubin: 0.7 mg/dL (ref 0.3–1.2)
Total Protein: 6.4 g/dL — ABNORMAL LOW (ref 6.5–8.1)

## 2022-07-06 LAB — CBG MONITORING, ED: Glucose-Capillary: 115 mg/dL — ABNORMAL HIGH (ref 70–99)

## 2022-07-06 LAB — GLUCOSE, CAPILLARY: Glucose-Capillary: 146 mg/dL — ABNORMAL HIGH (ref 70–99)

## 2022-07-06 LAB — TROPONIN I (HIGH SENSITIVITY): Troponin I (High Sensitivity): 4 ng/L (ref <18)

## 2022-07-06 MED ORDER — SIMVASTATIN 20 MG PO TABS
10.0000 mg | ORAL_TABLET | Freq: Every day | ORAL | Status: DC
  Administered 2022-07-06: 10 mg via ORAL
  Filled 2022-07-06: qty 1

## 2022-07-06 MED ORDER — LACTATED RINGERS IV SOLN: INTRAVENOUS | Status: DC

## 2022-07-06 MED ORDER — INSULIN ASPART 100 UNIT/ML IJ SOLN: 0.0000 [IU] | Freq: Every day | INTRAMUSCULAR | Status: DC

## 2022-07-06 MED ORDER — ALBUTEROL SULFATE (2.5 MG/3ML) 0.083% IN NEBU: 2.5000 mg | INHALATION_SOLUTION | Freq: Four times a day (QID) | RESPIRATORY_TRACT | Status: DC | PRN

## 2022-07-06 MED ORDER — HYDRALAZINE HCL 20 MG/ML IJ SOLN: 10.0000 mg | Freq: Four times a day (QID) | INTRAMUSCULAR | Status: DC | PRN

## 2022-07-06 MED ORDER — SODIUM CHLORIDE 0.9 % IV SOLN: INTRAVENOUS | Status: DC

## 2022-07-06 MED ORDER — SODIUM CHLORIDE 0.9 % IV BOLUS
500.0000 mL | Freq: Once | INTRAVENOUS | Status: AC
  Administered 2022-07-06: 500 mL via INTRAVENOUS

## 2022-07-06 MED ORDER — ACETAMINOPHEN 650 MG RE SUPP: 650.0000 mg | Freq: Four times a day (QID) | RECTAL | Status: DC | PRN

## 2022-07-06 MED ORDER — ALBUTEROL SULFATE (2.5 MG/3ML) 0.083% IN NEBU: 2.5000 mg | INHALATION_SOLUTION | Freq: Four times a day (QID) | RESPIRATORY_TRACT | Status: DC

## 2022-07-06 MED ORDER — SENNA 8.6 MG PO TABS
1.0000 | ORAL_TABLET | Freq: Two times a day (BID) | ORAL | Status: DC
  Administered 2022-07-06 – 2022-07-07 (×2): 8.6 mg via ORAL
  Filled 2022-07-06 (×2): qty 1

## 2022-07-06 MED ORDER — ENOXAPARIN SODIUM 40 MG/0.4ML IJ SOSY
40.0000 mg | PREFILLED_SYRINGE | Freq: Every day | INTRAMUSCULAR | Status: DC
  Administered 2022-07-06: 40 mg via SUBCUTANEOUS
  Filled 2022-07-06: qty 0.4

## 2022-07-06 MED ORDER — INSULIN ASPART 100 UNIT/ML IJ SOLN
0.0000 [IU] | Freq: Three times a day (TID) | INTRAMUSCULAR | Status: DC
  Administered 2022-07-07: 1 [IU] via SUBCUTANEOUS

## 2022-07-06 MED ORDER — MELATONIN 3 MG PO TABS
3.0000 mg | ORAL_TABLET | Freq: Every day | ORAL | Status: DC
  Administered 2022-07-06: 3 mg via ORAL
  Filled 2022-07-06: qty 1

## 2022-07-06 MED ORDER — ATENOLOL 25 MG PO TABS
50.0000 mg | ORAL_TABLET | Freq: Two times a day (BID) | ORAL | Status: DC
  Administered 2022-07-06 – 2022-07-07 (×2): 50 mg via ORAL
  Filled 2022-07-06 (×2): qty 2

## 2022-07-06 MED ORDER — ACETAMINOPHEN 325 MG PO TABS
650.0000 mg | ORAL_TABLET | Freq: Four times a day (QID) | ORAL | Status: DC | PRN
  Filled 2022-07-06: qty 2

## 2022-07-06 MED ORDER — ZOLPIDEM TARTRATE 5 MG PO TABS
5.0000 mg | ORAL_TABLET | Freq: Every evening | ORAL | Status: DC | PRN
  Filled 2022-07-06: qty 1

## 2022-07-06 NOTE — Progress Notes (Signed)
Called ED for patient report, advised nurse not available at this time.

## 2022-07-06 NOTE — H&P (Signed)
[facility] History and Physical  [name] YYT:[id number] DOB: [birth year] DOA: 07/06/2022 PCP: Manon Hilding, MD  Admitted from: Home Chief Complaint: Worsening confusion  History of Present Illness: [name] is a 88 y.o. female with DM2, HLD, CAD, A-fib, PSVT Patient was brought to the ED from home today for worsening confusion. Last hospitalized 6/13-6/16/2023 for confusion secondary to UTI, hyponatremia and was discharged home on oral antibiotics and salt tablets.  Patient continued to have intermittent episodes of confusion. She recently completed another course of antibiotics for UTI despite which confusion did not improve.    Per family, patient was up early this morning and looked more confused and unable to verbalize and hence they brought to the ED  In the ED, patient was afebrile, heart rate ranging from 50s to 90s, blood pressure as high as 160s, breathing on room air Labs with sodium low at 1.7, potassium 4.4, renal function normal, WBC normal at 8.2, hemoglobin at 11.5.  Urinalysis pending. CT scan of head did not show any acute intracranial abnormality.  At the time of my evaluation, patient was alert, awake, oriented to place and person, lying down in bed.  Not in distress.  Not on supplemental oxygen.  Her daughter and daughter-in-law were at bedside.  It seems that they are providing excellent care to the patient to keep her quality of life at home.  Physically, patient is strong enough to get up and walk without assistance.  Mentally, they have been seen more frequent intermittent confusions for last several weeks. She lives at home alone but her family has been taking turns to take care of her.  Review of Systems:  All systems were reviewed and were negative unless otherwise mentioned in the HPI   Past medical history: Past Medical History:  Diagnosis Date   Atrial fibrillation ([place])    Coronary atherosclerosis of native coronary artery    Nonobstructive    Dyslipidemia    PSVT (paroxysmal supraventricular tachycardia) ([facility])    TIA (transient ischemic attack)    Type 2 diabetes mellitus ([place])    Diet controlled    Past surgical history: Past Surgical History:  Procedure Laterality Date   ABDOMINAL HYSTERECTOMY     CARPAL TUNNEL RELEASE     x's 2   CHOLECYSTECTOMY     REPLACEMENT TOTAL KNEE  2002 & 2012   SHOULDER SURGERY  2001    Social History:  reports that she has never smoked. She has never used smokeless tobacco. She reports that she does not drink alcohol and does not use drugs.  Allergies:  Allergies  Allergen Reactions   Toprol Xl [Metoprolol Succinate] Other (See Comments)    HAIR LOSS   Toprol xl [metoprolol succinate]   Family history:  Family History  Problem Relation Age of Onset   Coronary artery disease Mother      Home Meds: Prior to Admission medications   Medication Sig Start Date End Date Taking? Authorizing Provider  acetaminophen (TYLENOL) 500 MG tablet Take 1,000 mg by mouth 2 (two) times daily.   Yes [provider]  atenolol (TENORMIN) 50 MG tablet TAKE 1 TABLET BY MOUTH TWICE A DAY 08/07/21  Yes Satira Sark, MD  cephALEXin (KEFLEX) 250 MG capsule Take 250 mg by mouth 3 (three) times daily. 06/29/22  Yes [provider]  meclizine (ANTIVERT) 25 MG tablet Take 1 tablet by mouth 3 (three) times daily as needed. 04/26/17  Yes [provider]  meloxicam (  MOBIC) 7.5 MG tablet Take 7.5 mg by mouth every other day. 02/09/22  Yes [provider]  metFORMIN (GLUCOPHAGE-XR) 500 MG 24 hr tablet Take 2 tabs (1,000mg ) by mouth every morning & 1 tab (500mg ) every evening 03/24/13  Yes [provider]  Multiple Vitamin (MULTIVITAMIN) tablet Take 1 tablet by mouth daily.   Yes [provider]  multivitamin-lutein (OCUVITE-LUTEIN) CAPS capsule Take 1 capsule by mouth 2 (two) times daily.   Yes [provider]  simvastatin (ZOCOR) 10 MG tablet Take 10  mg by mouth at bedtime.   Yes [provider]  ELIQUIS 5 MG TABS tablet Take 1 tablet (5 mg total) by mouth 2 (two) times daily. Patient not taking: Reported on 07/06/2022 06/11/22   Satira Sark, MD  folic acid (FOLVITE) 1 MG tablet Take 1 tablet (1 mg total) by mouth daily. Patient not taking: Reported on 07/06/2022 05/04/22   Aline August, MD  nitroGLYCERIN (NITROSTAT) 0.4 MG SL tablet Place 1 tablet (0.4 mg total) under the tongue every 5 (five) minutes x 3 doses as needed for chest pain (if no relief after 3rd dose, proceed to the ED for an evaluation or call 911). 07/14/19 05/01/22  Satira Sark, MD  sodium chloride 1 g tablet Take 1 tablet (1 g total) by mouth 3 (three) times daily with meals. Patient not taking: Reported on 07/06/2022 05/04/22   Aline August, MD  tamsulosin (FLOMAX) 0.4 MG CAPS capsule Take 1 capsule (0.4 mg total) by mouth daily after supper. Patient not taking: Reported on 07/06/2022 05/04/22   Aline August, MD  vitamin B-12 (CYANOCOBALAMIN) 1000 MCG tablet Take 1 tablet (1,000 mcg total) by mouth daily. Patient not taking: Reported on 07/06/2022 05/04/22   Aline August, MD    Physical Exam: Vitals:   07/06/22 1600 07/06/22 1604 07/06/22 1800 07/06/22 1803  BP: (!) 163/96 (!) 163/96 (!) 158/82   Pulse: 94 95 72 82  Resp: 18 20 (!) 23 19  Temp:  98 F (36.7 C)    TempSrc:  Oral    SpO2: 96% 95% 91% 96%   Wt Readings from Last 3 Encounters:  05/04/22 62.8 kg  03/07/22 62.3 kg  12/07/21 62.7 kg   There is no height or weight on file to calculate BMI.  General exam: Pleasant, elderly Caucasian female.  Not in physical distress Skin: No rashes, lesions or ulcers. HEENT: Atraumatic, normocephalic, no obvious bleeding Lungs: Clear to auscultation bilaterally CVS: Regular rate and rhythm, no murmur GI/Abd: Nontender, nondistended, bowel sound present CNS: Alert, awake, oriented to place and person which is her baseline. Psychiatry: Mood  appropriate Extremities: No pedal edema, no calf tenderness     Consult Orders  (From admission, onward)           Start     Ordered   07/06/22 1550  Consult to hospitalist  Once       Provider:  (Not yet assigned)  Question Answer Comment  Place call to: [facility]   Reason for Consult Admit      07/06/22 1549            Labs on Admission:   CBC: Recent Labs  Lab 07/06/22 1208  WBC 8.2  NEUTROABS 6.7  HGB 11.5*  HCT 36.1  MCV 94.5  PLT 528    Basic Metabolic Panel: Recent Labs  Lab 07/06/22 1353  NA 127*  K 4.4  CL 95*  CO2 25  GLUCOSE 118*  BUN  17  CREATININE 0.55  CALCIUM 8.9    Liver Function Tests: Recent Labs  Lab 07/06/22 1353  AST 17  ALT 12  ALKPHOS 43  BILITOT 0.7  PROT 6.4*  ALBUMIN 3.8   No results for input(s): "LIPASE", "AMYLASE" in the last 168 hours. No results for input(s): "AMMONIA" in the last 168 hours.  Cardiac Enzymes: No results for input(s): "CKTOTAL", "CKMB", "CKMBINDEX", "TROPONINI" in the last 168 hours.  BNP (last 3 results) No results for input(s): "BNP" in the last 8760 hours.  ProBNP (last 3 results) No results for input(s): "PROBNP" in the last 8760 hours.  CBG: Recent Labs  Lab 07/06/22 1418  GLUCAP 115*    Lipase  No results found for: "LIPASE"   Urinalysis    Component Value Date/Time   COLORURINE STRAW (A) 07/06/2022 [place] 07/06/2022 1208   LABSPEC 1.008 07/06/2022 1208   PHURINE 7.0 07/06/2022 1208   GLUCOSEU NEGATIVE 07/06/2022 1208   HGBUR SMALL (A) 07/06/2022 1208   BILIRUBINUR NEGATIVE 07/06/2022 1208   KETONESUR NEGATIVE 07/06/2022 1208   PROTEINUR NEGATIVE 07/06/2022 1208   NITRITE NEGATIVE 07/06/2022 1208   LEUKOCYTESUR MODERATE (A) 07/06/2022 1208     Drugs of Abuse  No results found for: "LABOPIA", "COCAINSCRNUR", "LABBENZ", "AMPHETMU", "THCU", "LABBARB"    Radiological Exams on Admission: CT HEAD WO CONTRAST  Result Date:  07/06/2022 CLINICAL DATA:  Altered mental status. EXAM: CT HEAD WITHOUT CONTRAST TECHNIQUE: Contiguous axial images were obtained from the base of the skull through the vertex without intravenous contrast. RADIATION DOSE REDUCTION: This exam was performed according to the departmental dose-optimization program which includes automated exposure control, adjustment of the mA and/or kV according to patient size and/or use of iterative reconstruction technique. COMPARISON:  May 01, 2022. FINDINGS: Brain: Mild chronic ischemic white matter disease is noted. No mass effect or midline shift is noted. Ventricular size is within normal limits. There is no evidence of mass lesion, hemorrhage or acute infarction. Vascular: No hyperdense vessel or unexpected calcification. Skull: Normal. Negative for fracture or focal lesion. Sinuses/Orbits: No acute finding. Other: None. IMPRESSION: No acute intracranial abnormality seen. Electronically Signed   By: Marijo Conception M.D.   On: 07/06/2022 13:45     ------------------------------------------------------------------------------------------------------ Assessment/Plan: Principal Problem:   Acute metabolic encephalopathy  Acute progressive metabolic encephalopathy Per family, patient has progressively worsening intermittent confusion for last several weeks.  Slept few hours only last night and was more confused this morning and hence brought to the ED. Recently hospitalized 2 months ago for UTI, hyponatremia treated.  Again treated for UTI recently as an outpatient. No fever, WBC count normal.  Urinalysis showed clear straw-colored urine with moderate leukocytes and rare bacteria.  She completed the last dose of antibiotic this morning only at home.  I would not initiate another course of antibiotics at this time.   CT head unremarkable for acute abnormalities. Sodium level low but within baseline. I suspect her intermittent confusion is secondary to evolving  dementia, probably worsened in last 24 hours because of change in sleep cycle.   Family wants to observe her overnight.  I encouraged family to maintain her circadian rhythm at home.  Can use nonsedating hypnotics like melatonin as needed.  Chronic hyponatremia Sodium level has been consistently low for last 1 year ranging from 120-134.  It is 127 today.  Gentle IV hydration started Recent Labs  Lab 07/06/22 1353  NA 127*   Type 2 diabetes mellitus A1c  pending PTA on metformin 1000 mg a.m. and 500 mg p.m we will keep it on hold at this time. Start sliding scale insulin Accu-Cheks. No results found for: "HGBA1C" Recent Labs  Lab 07/06/22 1418  GLUCAP 115*   CAD, HLD PTA on Zocor 10 mg at bedtime.  Resume the same.  A-fib, PSVT PTA on atenolol 50 mg twice daily.  Resume the same.  Does not seem to be on anticoagulation which is appropriate given her age and risk.  Mobility Family does not think she will need PT evaluation.  Goals of care - -  Code Status: Prior DNR/DNI per daughters at bedside  Diet: Regular diet Diet Order     None      DVT prophylaxis: Lovenox subcu    Antimicrobials: None Fluid: NS at 50 mill per hour overnight Consultants: None Family Communication: Daughter and daughter-in-law at bedside Dispo: The patient is from: Home              Anticipated d/c is to: Home              Anticipated d/c date is: No acute event overnight and labs stable tomorrow, possible discharge to home tomorrow  ------------------------------------------------------------------------------------- Severity of Illness: The appropriate patient status for this patient is OBSERVATION. Observation status is judged to be reasonable and necessary in order to provide the required intensity of service to ensure the patient's safety. The patient's presenting symptoms, physical exam findings, and initial radiographic and laboratory data in the context of their medical condition is  felt to place them at decreased risk for further clinical deterioration. Furthermore, it is anticipated that the patient will be medically stable for discharge from the hospital within 2 midnights of admission.   Signed, Terrilee Croak, MD [facility] 07/06/2022

## 2022-07-06 NOTE — ED Notes (Signed)
Family reports pt went to bed at 2130 last night and was able to speak in complete sentances  Dr. Kathrynn Humble made aware and to go in to see pt

## 2022-07-06 NOTE — Progress Notes (Signed)
Second attempt to get pt report, nurse not available.

## 2022-07-06 NOTE — ED Triage Notes (Signed)
Pt brought in by [facility] for c/o increased confusion; pt recently diagnosed with UTI and finished her antibiotics today but family states pt is still confused   Pt is only alert to person  Daughter states pt was up walking all night long and at 0630 am pt was unable to form words  Pt went to bed last night at 2130 and was able to speak and form words

## 2022-07-06 NOTE — ED Notes (Signed)
Patient transported to CT 

## 2022-07-06 NOTE — ED Provider Notes (Signed)
[facility] EMERGENCY DEPARTMENT Provider Note   CSN: [id number] Arrival date & time: 07/06/22  1035     History {Add pertinent medical, surgical, social history, OB history to HPI:1} Chief Complaint  Patient presents with   Altered Mental Status    [name] is a 88 y.o. female.  HPI     88 year old female comes in with chief complaint of altered mental status.  Home Medications Prior to Admission medications   Medication Sig Start Date End Date Taking? Authorizing Provider  acetaminophen (TYLENOL) 500 MG tablet Take 1,000 mg by mouth 2 (two) times daily.   Yes [provider]  atenolol (TENORMIN) 50 MG tablet TAKE 1 TABLET BY MOUTH TWICE A DAY 08/07/21  Yes Satira Sark, MD  cephALEXin (KEFLEX) 250 MG capsule Take 250 mg by mouth 3 (three) times daily. 06/29/22  Yes [provider]  meclizine (ANTIVERT) 25 MG tablet Take 1 tablet by mouth 3 (three) times daily as needed. 04/26/17  Yes [provider]  meloxicam (MOBIC) 7.5 MG tablet Take 7.5 mg by mouth every other day. 02/09/22  Yes [provider]  metFORMIN (GLUCOPHAGE-XR) 500 MG 24 hr tablet Take 2 tabs (1,000mg ) by mouth every morning & 1 tab (500mg ) every evening 03/24/13  Yes [provider]  Multiple Vitamin (MULTIVITAMIN) tablet Take 1 tablet by mouth daily.   Yes [provider]  multivitamin-lutein (OCUVITE-LUTEIN) CAPS capsule Take 1 capsule by mouth 2 (two) times daily.   Yes [provider]  simvastatin (ZOCOR) 10 MG tablet Take 10 mg by mouth at bedtime.   Yes [provider]  ELIQUIS 5 MG TABS tablet Take 1 tablet (5 mg total) by mouth 2 (two) times daily. Patient not taking: Reported on 07/06/2022 06/11/22   Satira Sark, MD  folic acid (FOLVITE) 1 MG tablet Take 1 tablet (1 mg total) by mouth daily. Patient not taking: Reported on 07/06/2022 05/04/22   Aline August, MD  nitroGLYCERIN (NITROSTAT) 0.4 MG SL tablet Place 1 tablet (0.4  mg total) under the tongue every 5 (five) minutes x 3 doses as needed for chest pain (if no relief after 3rd dose, proceed to the ED for an evaluation or call 911). 07/14/19 05/01/22  Satira Sark, MD  sodium chloride 1 g tablet Take 1 tablet (1 g total) by mouth 3 (three) times daily with meals. Patient not taking: Reported on 07/06/2022 05/04/22   Aline August, MD  tamsulosin (FLOMAX) 0.4 MG CAPS capsule Take 1 capsule (0.4 mg total) by mouth daily after supper. Patient not taking: Reported on 07/06/2022 05/04/22   Aline August, MD  vitamin B-12 (CYANOCOBALAMIN) 1000 MCG tablet Take 1 tablet (1,000 mcg total) by mouth daily. Patient not taking: Reported on 07/06/2022 05/04/22   Aline August, MD      Allergies    Toprol xl [metoprolol succinate]    Review of Systems   Review of Systems  Physical Exam Updated Vital Signs BP (!) 163/96 (BP Location: Left Arm)   Pulse 95   Temp 98 F (36.7 C) (Oral)   Resp 20   SpO2 95%  Physical Exam  ED Results / Procedures / Treatments   Labs (all labs ordered are listed, but only abnormal results are displayed) Labs Reviewed  CBC WITH DIFFERENTIAL/PLATELET - Abnormal; Notable for the following components:      Result Value   RBC 3.82 (*)    Hemoglobin 11.5 (*)    All other components within normal  limits  COMPREHENSIVE METABOLIC PANEL - Abnormal; Notable for the following components:   Sodium 127 (*)    Chloride 95 (*)    Glucose, Bld 118 (*)    Total Protein 6.4 (*)    All other components within normal limits  CBG MONITORING, ED - Abnormal; Notable for the following components:   Glucose-Capillary 115 (*)    All other components within normal limits  URINALYSIS, ROUTINE W REFLEX MICROSCOPIC  TROPONIN I (HIGH SENSITIVITY)    EKG EKG Interpretation  Date/Time:  Friday July 06 2022 14:23:40 EDT Ventricular Rate:  113 PR Interval:    QRS Duration: 82 QT Interval:  342 QTC Calculation: 469 R Axis:   77 Text  Interpretation: Atrial fibrillation Minimal ST depression, diffuse leads No acute changes No significant change since last tracing Confirmed by Varney Biles ([id number]) on 07/06/2022 3:28:05 PM  Radiology CT HEAD WO CONTRAST  Result Date: 07/06/2022 CLINICAL DATA:  Altered mental status. EXAM: CT HEAD WITHOUT CONTRAST TECHNIQUE: Contiguous axial images were obtained from the base of the skull through the vertex without intravenous contrast. RADIATION DOSE REDUCTION: This exam was performed according to the departmental dose-optimization program which includes automated exposure control, adjustment of the mA and/or kV according to patient size and/or use of iterative reconstruction technique. COMPARISON:  May 01, 2022. FINDINGS: Brain: Mild chronic ischemic white matter disease is noted. No mass effect or midline shift is noted. Ventricular size is within normal limits. There is no evidence of mass lesion, hemorrhage or acute infarction. Vascular: No hyperdense vessel or unexpected calcification. Skull: Normal. Negative for fracture or focal lesion. Sinuses/Orbits: No acute finding. Other: None. IMPRESSION: No acute intracranial abnormality seen. Electronically Signed   By: Marijo Conception M.D.   On: 07/06/2022 13:45    Procedures Procedures  {Document cardiac monitor, telemetry assessment procedure when appropriate:1}  Medications Ordered in ED Medications  lactated ringers infusion ( Intravenous New Bag/Given 07/06/22 1409)  sodium chloride 0.9 % bolus 500 mL (0 mLs Intravenous Stopped 07/06/22 1403)    ED Course/ Medical Decision Making/ A&P                           Medical Decision Making Amount and/or Complexity of Data Reviewed Labs: ordered. Radiology: ordered.  Risk Prescription drug management. Decision regarding hospitalization.   ***  {Document critical care time when appropriate:1} {Document review of labs and clinical decision tools ie heart score, Chads2Vasc2 etc:1}   {Document your independent review of radiology images, and any outside records:1} {Document your discussion with family members, caretakers, and with consultants:1} {Document social determinants of health affecting pt's care:1} {Document your decision making why or why not admission, treatments were needed:1} Final Clinical Impression(s) / ED Diagnoses Final diagnoses:  Acute hyponatremia    Rx / DC Orders ED Discharge Orders     None

## 2022-07-07 DIAGNOSIS — E785 Hyperlipidemia, unspecified: Secondary | ICD-10-CM

## 2022-07-07 DIAGNOSIS — I4891 Unspecified atrial fibrillation: Secondary | ICD-10-CM

## 2022-07-07 DIAGNOSIS — I482 Chronic atrial fibrillation, unspecified: Secondary | ICD-10-CM | POA: Diagnosis present

## 2022-07-07 DIAGNOSIS — E119 Type 2 diabetes mellitus without complications: Secondary | ICD-10-CM

## 2022-07-07 DIAGNOSIS — G9341 Metabolic encephalopathy: Secondary | ICD-10-CM | POA: Diagnosis not present

## 2022-07-07 LAB — BASIC METABOLIC PANEL
Anion gap: 12 (ref 5–15)
BUN: 14 mg/dL (ref 8–23)
CO2: 22 mmol/L (ref 22–32)
Calcium: 9.5 mg/dL (ref 8.9–10.3)
Chloride: 96 mmol/L — ABNORMAL LOW (ref 98–111)
Creatinine, Ser: 0.51 mg/dL (ref 0.44–1.00)
GFR, Estimated: >60 mL/min (ref >60)
Glucose, Bld: 126 mg/dL — ABNORMAL HIGH (ref 70–99)
Potassium: 4.2 mmol/L (ref 3.5–5.1)
Sodium: 130 mmol/L — ABNORMAL LOW (ref 135–145)

## 2022-07-07 LAB — CBC
HCT: 37.7 % (ref 36.0–46.0)
Hemoglobin: 12.4 g/dL (ref 12.0–15.0)
MCH: 30.2 pg (ref 26.0–34.0)
MCHC: 32.9 g/dL (ref 30.0–36.0)
MCV: 92 fL (ref 80.0–100.0)
Platelets: 293 10*3/uL (ref 150–400)
RBC: 4.1 MIL/uL (ref 3.87–5.11)
RDW: 13.8 % (ref 11.5–15.5)
WBC: 7.9 10*3/uL (ref 4.0–10.5)
nRBC: 0 % (ref 0.0–0.2)

## 2022-07-07 LAB — HEMOGLOBIN A1C
Hgb A1c MFr Bld: 6.2 % — ABNORMAL HIGH (ref 4.8–5.6)
Mean Plasma Glucose: 131.24 mg/dL

## 2022-07-07 LAB — GLUCOSE, CAPILLARY
Glucose-Capillary: 129 mg/dL — ABNORMAL HIGH (ref 70–99)
Glucose-Capillary: 103 mg/dL — ABNORMAL HIGH (ref 70–99)

## 2022-07-07 NOTE — Assessment & Plan Note (Signed)
Continue reate control with atenolol, currently off anticoagulation due to fall risk. Plan to follow up as outpatient.

## 2022-07-07 NOTE — Assessment & Plan Note (Signed)
Continue with statin therapy.  ?

## 2022-07-07 NOTE — Progress Notes (Signed)
[facility] Discharge Note  Admit Date:  07/06/2022 Discharge date: 07/07/2022   [name] to be D/C'd Home per MD order.  AVS completed. Patient/caregiver able to verbalize understanding.  Discharge Medication: Allergies as of 07/07/2022       Reactions   Toprol Xl [metoprolol Succinate] Other (See Comments)   HAIR LOSS        Medication List     STOP taking these medications    cyanocobalamin 1000 MCG tablet Commonly known as: VITAMIN B12   Eliquis 5 MG Tabs tablet Generic drug: apixaban   folic acid 1 MG tablet Commonly known as: FOLVITE   multivitamin-lutein Caps capsule   sodium chloride 1 g tablet   tamsulosin 0.4 MG Caps capsule Commonly known as: FLOMAX       TAKE these medications    acetaminophen 500 MG tablet Commonly known as: TYLENOL Take 1,000 mg by mouth 2 (two) times daily.   atenolol 50 MG tablet Commonly known as: TENORMIN TAKE 1 TABLET BY MOUTH TWICE A DAY   cephALEXin 250 MG capsule Commonly known as: KEFLEX Take 250 mg by mouth 3 (three) times daily.   meclizine 25 MG tablet Commonly known as: ANTIVERT Take 1 tablet by mouth 3 (three) times daily as needed.   meloxicam 7.5 MG tablet Commonly known as: MOBIC Take 7.5 mg by mouth every other day.   metFORMIN 500 MG 24 hr tablet Commonly known as: GLUCOPHAGE-XR Take 2 tabs (1,000mg ) by mouth every morning & 1 tab (500mg ) every evening   multivitamin tablet Take 1 tablet by mouth daily.   nitroGLYCERIN 0.4 MG SL tablet Commonly known as: NITROSTAT Place 1 tablet (0.4 mg total) under the tongue every 5 (five) minutes x 3 doses as needed for chest pain (if no relief after 3rd dose, proceed to the ED for an evaluation or call 911).   simvastatin 10 MG tablet Commonly known as: ZOCOR Take 10 mg by mouth at bedtime.        Discharge Assessment: Vitals:   07/07/22 0810 07/07/22 1458  BP: (!) 162/93 (!) 159/89  Pulse: 88 85  Resp: 17 16  Temp: 98.8 F (37.1 C) 98.1 F (36.7  C)  SpO2: 99% 96%   Skin clean, dry and intact without evidence of skin break down, no evidence of skin tears noted. IV catheter discontinued intact. Site without signs and symptoms of complications - no redness or edema noted at insertion site, patient denies c/o pain - only slight tenderness at site.  Dressing with slight pressure applied.  D/c Instructions-Education: Discharge instructions given to patient/family with verbalized understanding. D/c education completed with patient/family including follow up instructions, medication list, d/c activities limitations if indicated, with other d/c instructions as indicated by MD - patient able to verbalize understanding, all questions fully answered. Patient instructed to return to ED, call 911, or call MD for any changes in condition.  Patient escorted via [place], and D/C home via private auto.  Tsosie Billing, LPN [id number] 0:16 PM

## 2022-07-07 NOTE — Assessment & Plan Note (Addendum)
Multifactorial encephalopathy including related to hyponatremia and recent urinary tract infection. Delirium  Patient was placed on supportive medical therapy with improvement and resolution of her symptoms.  At the time of her discharge she is back to her baseline. Electrolytes have corrected.  Patient will be discharge home with home health services.

## 2022-07-07 NOTE — Assessment & Plan Note (Signed)
Patient will continue metformin for glucose control as outpatient.

## 2022-07-07 NOTE — Discharge Summary (Signed)
Physician Discharge Summary   Patient: [name] MRN: [id number] DOB: Jul 08, 1927  Admit date:     07/06/2022  Discharge date: 07/07/22  Discharge Physician: [name] [name]   PCP: Manon Hilding, MD   Recommendations at discharge:    Encephalopathy has improved. Plan to follow up electrolytes in 7 days. Home health services.   Discharge Diagnoses: Principal Problem:   Acute metabolic encephalopathy  Resolved Problems:   * No resolved hospital problems. [facility] Course: [name] was admitted to the hospital with the working diagnosis of progressive encephalopathy.   88 yo female with the past medical history of T2DM, dyslipidemia, coronary artery disease, and atrial fibrillation who presented with worsening confusion. Recent hospitalization 06/13 to 05/04/22 due to urine infection complicated with metabolic encephalopathy. After her discharge continue to have intermittent confusion. On the day of hospitalization she had acute worsening, unable to talk, prompting her family to bring her to the hospital. On her initial physical examination HR 82, BP 163/96, RR 18 and 02 saturation 91% , lungs with no wheezing, heart with S1 and S2 present, irregularly irregular, abdomen with no distention and no lower extremity edema.   Na 127, K 4,4 CL 95, bicarbonate 25, glucose 118 bun 18 cr 0,55  Wbc 8,2 hgb 11,5 plt 292 Urine analysis SG 1,008, 0-5 wbc  Head CT with no acute changes.   EKG 113 bpm, normal axis, normal qtc, atrial fibrillation rhythm, no significant ST segment or T wave changes.       Assessment and Plan: * Acute metabolic encephalopathy Multifactorial encephalopathy including related to hyponatremia and recent urinary tract infection. Delirium  Patient was placed on supportive medical therapy with improvement and resolution of her symptoms.  At the time of her discharge she is back to her baseline. Electrolytes have corrected.  Patient will be discharge  home with home health services.   Atrial fibrillation ([place]) Continue reate control with atenolol, currently off anticoagulation due to fall risk. Plan to follow up as outpatient.   Dyslipidemia Continue with statin therapy.   Type 2 diabetes mellitus ([place]) Patient will continue metformin for glucose control as outpatient.          Consultants: none  Procedures performed: none   Disposition: Home Diet recommendation:  Regular diet DISCHARGE MEDICATION: Allergies as of 07/07/2022       Reactions   Toprol Xl [metoprolol Succinate] Other (See Comments)   HAIR LOSS        Medication List     STOP taking these medications    cyanocobalamin 1000 MCG tablet Commonly known as: VITAMIN B12   Eliquis 5 MG Tabs tablet Generic drug: apixaban   folic acid 1 MG tablet Commonly known as: FOLVITE   multivitamin-lutein Caps capsule   sodium chloride 1 g tablet   tamsulosin 0.4 MG Caps capsule Commonly known as: FLOMAX       TAKE these medications    acetaminophen 500 MG tablet Commonly known as: TYLENOL Take 1,000 mg by mouth 2 (two) times daily.   atenolol 50 MG tablet Commonly known as: TENORMIN TAKE 1 TABLET BY MOUTH TWICE A DAY   cephALEXin 250 MG capsule Commonly known as: KEFLEX Take 250 mg by mouth 3 (three) times daily.   meclizine 25 MG tablet Commonly known as: ANTIVERT Take 1 tablet by mouth 3 (three) times daily as needed.   meloxicam 7.5 MG tablet Commonly known as: MOBIC Take 7.5 mg by mouth every other day.  metFORMIN 500 MG 24 hr tablet Commonly known as: GLUCOPHAGE-XR Take 2 tabs (1,000mg ) by mouth every morning & 1 tab (500mg ) every evening   multivitamin tablet Take 1 tablet by mouth daily.   nitroGLYCERIN 0.4 MG SL tablet Commonly known as: NITROSTAT Place 1 tablet (0.4 mg total) under the tongue every 5 (five) minutes x 3 doses as needed for chest pain (if no relief after 3rd dose, proceed to the ED for an evaluation or call  911).   simvastatin 10 MG tablet Commonly known as: ZOCOR Take 10 mg by mouth at bedtime.        Discharge Exam: Filed Weights   07/06/22 1959  Weight: 63.3 kg   BP (!) 162/93 (BP Location: Left Arm)   Pulse 88   Temp 98.8 F (37.1 C)   Resp 17   Wt 63.3 kg   SpO2 99%   BMI 24.72 kg/m   Patient is back to her baseline, no further confusion.   Neurology awake and alert ENT with mild pallor Cardiovascular with S1 and S2 present and rhythmic Respiratory with no rales or wheezing Abdomen with no distention  No lower extremity edema   Condition at discharge: stable  The results of significant diagnostics from this hospitalization (including imaging, microbiology, ancillary and laboratory) are listed below for reference.   Imaging Studies: CT HEAD WO CONTRAST  Result Date: 07/06/2022 CLINICAL DATA:  Altered mental status. EXAM: CT HEAD WITHOUT CONTRAST TECHNIQUE: Contiguous axial images were obtained from the base of the skull through the vertex without intravenous contrast. RADIATION DOSE REDUCTION: This exam was performed according to the departmental dose-optimization program which includes automated exposure control, adjustment of the mA and/or kV according to patient size and/or use of iterative reconstruction technique. COMPARISON:  May 01, 2022. FINDINGS: Brain: Mild chronic ischemic white matter disease is noted. No mass effect or midline shift is noted. Ventricular size is within normal limits. There is no evidence of mass lesion, hemorrhage or acute infarction. Vascular: No hyperdense vessel or unexpected calcification. Skull: Normal. Negative for fracture or focal lesion. Sinuses/Orbits: No acute finding. Other: None. IMPRESSION: No acute intracranial abnormality seen. Electronically Signed   By: Marijo Conception M.D.   On: 07/06/2022 13:45    Microbiology: Results for orders placed or performed during the hospital encounter of 05/01/22  SARS Coronavirus 2 by RT PCR  (hospital order, performed in [facility] hospital lab) *cepheid single result test* Anterior Nasal Swab     Status: None   Collection Time: 05/01/22  8:18 PM   Specimen: Anterior Nasal Swab  Result Value Ref Range Status   SARS Coronavirus 2 by RT PCR NEGATIVE NEGATIVE Final    Comment: (NOTE) SARS-CoV-2 target nucleic acids are NOT DETECTED.  The SARS-CoV-2 RNA is generally detectable in upper and lower respiratory specimens during the acute phase of infection. The lowest concentration of SARS-CoV-2 viral copies this assay can detect is 250 copies / mL. A negative result does not preclude SARS-CoV-2 infection and should not be used as the sole basis for treatment or other patient management decisions.  A negative result may occur with improper specimen collection / handling, submission of specimen other than nasopharyngeal swab, presence of viral mutation(s) within the areas targeted by this assay, and inadequate number of viral copies (<250 copies / mL). A negative result must be combined with clinical observations, patient history, and epidemiological information.  Fact Sheet for Patients:   https://www.patel.info/  Fact Sheet for Healthcare Providers: https://hall.com/  This test is not yet approved or  cleared by the [place] and has been authorized for detection and/or diagnosis of SARS-CoV-2 by FDA under an Emergency Use Authorization (EUA).  This EUA will remain in effect (meaning this test can be used) for the duration of the COVID-19 declaration under Section 564(b)(1) of the Act, 21 U.S.C. section 360bbb-3(b)(1), unless the authorization is terminated or revoked sooner.  Performed at [facility], [address], [place], [place] [address]   Urine Culture     Status: Abnormal   Collection Time: 05/01/22 10:18 PM   Specimen: Urine, Catheterized  Result Value Ref Range Status   Specimen Description   Final    URINE,  CATHETERIZED Performed at [facility], [address], [place], [place] [address]    Special Requests   Final    NONE Performed at [facility], [address] [place], [place] [address]    Culture >=100,000 COLONIES/mL KLEBSIELLA PNEUMONIAE (A)  Final   Report Status 05/04/2022 FINAL  Final   Organism ID, Bacteria KLEBSIELLA PNEUMONIAE (A)  Final      Susceptibility   Klebsiella pneumoniae - MIC*    AMPICILLIN RESISTANT Resistant     CEFAZOLIN <=4 SENSITIVE Sensitive     CEFEPIME <=0.12 SENSITIVE Sensitive     CEFTRIAXONE <=0.25 SENSITIVE Sensitive     CIPROFLOXACIN <=0.25 SENSITIVE Sensitive     GENTAMICIN <=1 SENSITIVE Sensitive     IMIPENEM <=0.25 SENSITIVE Sensitive     NITROFURANTOIN 32 SENSITIVE Sensitive     TRIMETH/SULFA <=20 SENSITIVE Sensitive     AMPICILLIN/SULBACTAM 4 SENSITIVE Sensitive     PIP/TAZO <=4 SENSITIVE Sensitive     * >=100,000 COLONIES/mL KLEBSIELLA PNEUMONIAE    Labs: CBC: Recent Labs  Lab 07/06/22 1208 07/07/22 0433  WBC 8.2 7.9  NEUTROABS 6.7  --   HGB 11.5* 12.4  HCT 36.1 37.7  MCV 94.5 92.0  PLT 292 858   Basic Metabolic Panel: Recent Labs  Lab 07/06/22 1353 07/07/22 0433  NA 127* 130*  K 4.4 4.2  CL 95* 96*  CO2 25 22  GLUCOSE 118* 126*  BUN 17 14  CREATININE 0.55 0.51  CALCIUM 8.9 9.5   Liver Function Tests: Recent Labs  Lab 07/06/22 1353  AST 17  ALT 12  ALKPHOS 43  BILITOT 0.7  PROT 6.4*  ALBUMIN 3.8   CBG: Recent Labs  Lab 07/06/22 1418 07/06/22 2158 07/07/22 0805 07/07/22 1135  GLUCAP 115* 146* 129* 103*    Discharge time spent: greater than 30 minutes.  Signed: Tawni Millers, MD [facility] 07/07/2022

## 2022-07-07 NOTE — Hospital Course (Signed)
[name] was admitted to the hospital with the working diagnosis of progressive encephalopathy.   88 yo female with the past medical history of T2DM, dyslipidemia, coronary artery disease, and atrial fibrillation who presented with worsening confusion. Recent hospitalization 06/13 to 05/04/22 due to urine infection complicated with metabolic encephalopathy. After her discharge continue to have intermittent confusion. On the day of hospitalization she had acute worsening, unable to talk, prompting her family to bring her to the hospital. On her initial physical examination HR 82, BP 163/96, RR 18 and 02 saturation 91% , lungs with no wheezing, heart with S1 and S2 present, irregularly irregular, abdomen with no distention and no lower extremity edema.   Na 127, K 4,4 CL 95, bicarbonate 25, glucose 118 bun 18 cr 0,55  Wbc 8,2 hgb 11,5 plt 292 Urine analysis SG 1,008, 0-5 wbc  Head CT with no acute changes.   EKG 113 bpm, normal axis, normal qtc, atrial fibrillation rhythm, no significant ST segment or T wave changes.

## 2022-07-07 NOTE — Progress Notes (Signed)
Patient in need of [facility] RN. Multiple attempts made to contact both son and daughter. Patient referred to and accepted by Penni Homans with [phone].     Farhaan Mabee, Clydene Pugh, LCSW

## 2022-07-13 ENCOUNTER — Encounter: Payer: Self-pay | Admitting: *Deleted

## 2022-07-13 ENCOUNTER — Telehealth: Payer: Self-pay | Admitting: *Deleted

## 2022-07-13 NOTE — Patient Outreach (Signed)
  Care Coordination   Initial Visit Note   07/13/2022 Name: [name] MRN: [id number] DOB: 13-Sep-1927  [name] is a 88 y.o. year old female who sees Sasser, Silvestre Moment, MD for primary care. I  Spoke with daughter, [name], by telephone.  What matters to the patients health and wellness today?  Ongoing management of medical conditions   Goals Addressed             This Visit's Progress    Care Coordination Services (no follow-up required)       Care Coordination Interventions: Provided patient and/or caregiver with verbal information about [place] (community resource) Assessed social determinant of health barriers Encouraged daughter, [name], to reach out to PCP for referral if Care Coordination services are needed in the future        SDOH assessments and interventions completed:  Yes  SDOH Interventions Today    Flowsheet Row Most Recent Value  SDOH Interventions   Financial Strain Interventions Intervention Not Indicated  Housing Interventions Intervention Not Indicated  Transportation Interventions Intervention Not Indicated        Care Coordination Interventions Activated:  Yes  Care Coordination Interventions:  Yes, provided   Follow up plan: No further intervention required.   Encounter Outcome:  Pt. Visit Completed   Chong Sicilian, BSN, RN-BC RN Care Coordinator Direct Dial: [phone]

## 2022-07-21 DIAGNOSIS — Z20828 Contact with and (suspected) exposure to other viral communicable diseases: Secondary | ICD-10-CM | POA: Diagnosis not present

## 2022-07-25 DIAGNOSIS — D649 Anemia, unspecified: Secondary | ICD-10-CM | POA: Diagnosis not present

## 2022-07-25 DIAGNOSIS — E876 Hypokalemia: Secondary | ICD-10-CM | POA: Diagnosis not present

## 2022-07-25 DIAGNOSIS — E1165 Type 2 diabetes mellitus with hyperglycemia: Secondary | ICD-10-CM | POA: Diagnosis not present

## 2022-07-25 DIAGNOSIS — Z13220 Encounter for screening for lipoid disorders: Secondary | ICD-10-CM | POA: Diagnosis not present

## 2022-07-31 DIAGNOSIS — E1121 Type 2 diabetes mellitus with diabetic nephropathy: Secondary | ICD-10-CM | POA: Diagnosis not present

## 2022-07-31 DIAGNOSIS — Z23 Encounter for immunization: Secondary | ICD-10-CM | POA: Diagnosis not present

## 2022-07-31 DIAGNOSIS — I482 Chronic atrial fibrillation, unspecified: Secondary | ICD-10-CM | POA: Diagnosis not present

## 2022-07-31 DIAGNOSIS — R634 Abnormal weight loss: Secondary | ICD-10-CM | POA: Diagnosis not present

## 2022-07-31 DIAGNOSIS — E1140 Type 2 diabetes mellitus with diabetic neuropathy, unspecified: Secondary | ICD-10-CM | POA: Diagnosis not present

## 2022-07-31 DIAGNOSIS — R809 Proteinuria, unspecified: Secondary | ICD-10-CM | POA: Diagnosis not present

## 2022-07-31 DIAGNOSIS — I10 Essential (primary) hypertension: Secondary | ICD-10-CM | POA: Diagnosis not present

## 2022-07-31 DIAGNOSIS — E1165 Type 2 diabetes mellitus with hyperglycemia: Secondary | ICD-10-CM | POA: Diagnosis not present

## 2022-07-31 DIAGNOSIS — R0989 Other specified symptoms and signs involving the circulatory and respiratory systems: Secondary | ICD-10-CM | POA: Diagnosis not present

## 2022-07-31 DIAGNOSIS — E7801 Familial hypercholesterolemia: Secondary | ICD-10-CM | POA: Diagnosis not present

## 2022-08-18 ENCOUNTER — Emergency Department (HOSPITAL_COMMUNITY): Payer: Medicare Other

## 2022-08-18 ENCOUNTER — Encounter (HOSPITAL_COMMUNITY): Payer: Self-pay

## 2022-08-18 ENCOUNTER — Other Ambulatory Visit: Payer: Self-pay

## 2022-08-18 ENCOUNTER — Inpatient Hospital Stay (HOSPITAL_COMMUNITY)
Admission: EM | Admit: 2022-08-18 | Discharge: 2022-08-21 | DRG: 391 | Disposition: A | Discharge status: Skilled Nursing Facility | Payer: Medicare Other | Attending: Family Medicine | Admitting: Family Medicine

## 2022-08-18 DIAGNOSIS — R627 Adult failure to thrive: Secondary | ICD-10-CM | POA: Diagnosis present

## 2022-08-18 DIAGNOSIS — Z87440 Personal history of urinary (tract) infections: Secondary | ICD-10-CM

## 2022-08-18 DIAGNOSIS — Z888 Allergy status to other drugs, medicaments and biological substances status: Secondary | ICD-10-CM | POA: Diagnosis not present

## 2022-08-18 DIAGNOSIS — L89891 Pressure ulcer of other site, stage 1: Secondary | ICD-10-CM | POA: Diagnosis present

## 2022-08-18 DIAGNOSIS — I10 Essential (primary) hypertension: Secondary | ICD-10-CM | POA: Diagnosis present

## 2022-08-18 DIAGNOSIS — Z66 Do not resuscitate: Secondary | ICD-10-CM | POA: Diagnosis present

## 2022-08-18 DIAGNOSIS — R279 Unspecified lack of coordination: Secondary | ICD-10-CM | POA: Diagnosis present

## 2022-08-18 DIAGNOSIS — M6281 Muscle weakness (generalized): Secondary | ICD-10-CM | POA: Diagnosis present

## 2022-08-18 DIAGNOSIS — M542 Cervicalgia: Secondary | ICD-10-CM | POA: Diagnosis not present

## 2022-08-18 DIAGNOSIS — I2510 Atherosclerotic heart disease of native coronary artery without angina pectoris: Secondary | ICD-10-CM | POA: Diagnosis present

## 2022-08-18 DIAGNOSIS — M109 Gout, unspecified: Secondary | ICD-10-CM | POA: Diagnosis present

## 2022-08-18 DIAGNOSIS — G8929 Other chronic pain: Secondary | ICD-10-CM | POA: Diagnosis present

## 2022-08-18 DIAGNOSIS — Z79899 Other long term (current) drug therapy: Secondary | ICD-10-CM

## 2022-08-18 DIAGNOSIS — G9341 Metabolic encephalopathy: Secondary | ICD-10-CM | POA: Diagnosis present

## 2022-08-18 DIAGNOSIS — N183 Chronic kidney disease, stage 3 unspecified: Secondary | ICD-10-CM | POA: Diagnosis present

## 2022-08-18 DIAGNOSIS — Z6824 Body mass index (BMI) 24.0-24.9, adult: Secondary | ICD-10-CM

## 2022-08-18 DIAGNOSIS — E785 Hyperlipidemia, unspecified: Secondary | ICD-10-CM | POA: Diagnosis not present

## 2022-08-18 DIAGNOSIS — Z9181 History of falling: Secondary | ICD-10-CM

## 2022-08-18 DIAGNOSIS — K5732 Diverticulitis of large intestine without perforation or abscess without bleeding: Principal | ICD-10-CM | POA: Diagnosis present

## 2022-08-18 DIAGNOSIS — E871 Hypo-osmolality and hyponatremia: Secondary | ICD-10-CM | POA: Diagnosis not present

## 2022-08-18 DIAGNOSIS — R4189 Other symptoms and signs involving cognitive functions and awareness: Secondary | ICD-10-CM | POA: Diagnosis present

## 2022-08-18 DIAGNOSIS — R531 Weakness: Principal | ICD-10-CM

## 2022-08-18 DIAGNOSIS — R0682 Tachypnea, not elsewhere classified: Secondary | ICD-10-CM | POA: Diagnosis not present

## 2022-08-18 DIAGNOSIS — Z8673 Personal history of transient ischemic attack (TIA), and cerebral infarction without residual deficits: Secondary | ICD-10-CM | POA: Diagnosis not present

## 2022-08-18 DIAGNOSIS — F0390 Unspecified dementia without behavioral disturbance: Secondary | ICD-10-CM | POA: Diagnosis present

## 2022-08-18 DIAGNOSIS — Z7984 Long term (current) use of oral hypoglycemic drugs: Secondary | ICD-10-CM

## 2022-08-18 DIAGNOSIS — N3000 Acute cystitis without hematuria: Secondary | ICD-10-CM | POA: Diagnosis present

## 2022-08-18 DIAGNOSIS — E669 Obesity, unspecified: Secondary | ICD-10-CM | POA: Diagnosis present

## 2022-08-18 DIAGNOSIS — I700 Atherosclerosis of aorta: Secondary | ICD-10-CM | POA: Diagnosis present

## 2022-08-18 DIAGNOSIS — R2689 Other abnormalities of gait and mobility: Secondary | ICD-10-CM | POA: Diagnosis present

## 2022-08-18 DIAGNOSIS — E1122 Type 2 diabetes mellitus with diabetic chronic kidney disease: Secondary | ICD-10-CM | POA: Diagnosis present

## 2022-08-18 DIAGNOSIS — Z90710 Acquired absence of both cervix and uterus: Secondary | ICD-10-CM | POA: Diagnosis not present

## 2022-08-18 DIAGNOSIS — K5792 Diverticulitis of intestine, part unspecified, without perforation or abscess without bleeding: Secondary | ICD-10-CM | POA: Diagnosis not present

## 2022-08-18 DIAGNOSIS — E441 Mild protein-calorie malnutrition: Secondary | ICD-10-CM | POA: Diagnosis present

## 2022-08-18 DIAGNOSIS — Z8249 Family history of ischemic heart disease and other diseases of the circulatory system: Secondary | ICD-10-CM

## 2022-08-18 DIAGNOSIS — I129 Hypertensive chronic kidney disease with stage 1 through stage 4 chronic kidney disease, or unspecified chronic kidney disease: Secondary | ICD-10-CM | POA: Diagnosis present

## 2022-08-18 DIAGNOSIS — M5134 Other intervertebral disc degeneration, thoracic region: Secondary | ICD-10-CM | POA: Diagnosis present

## 2022-08-18 DIAGNOSIS — I4891 Unspecified atrial fibrillation: Secondary | ICD-10-CM | POA: Diagnosis present

## 2022-08-18 DIAGNOSIS — I480 Paroxysmal atrial fibrillation: Secondary | ICD-10-CM | POA: Diagnosis present

## 2022-08-18 DIAGNOSIS — Z7901 Long term (current) use of anticoagulants: Secondary | ICD-10-CM | POA: Diagnosis not present

## 2022-08-18 DIAGNOSIS — L8990 Pressure ulcer of unspecified site, unspecified stage: Secondary | ICD-10-CM | POA: Diagnosis present

## 2022-08-18 DIAGNOSIS — N2889 Other specified disorders of kidney and ureter: Secondary | ICD-10-CM | POA: Diagnosis not present

## 2022-08-18 DIAGNOSIS — Z9049 Acquired absence of other specified parts of digestive tract: Secondary | ICD-10-CM

## 2022-08-18 DIAGNOSIS — I482 Chronic atrial fibrillation, unspecified: Secondary | ICD-10-CM | POA: Diagnosis present

## 2022-08-18 LAB — URINALYSIS, ROUTINE W REFLEX MICROSCOPIC
Bacteria, UA: NONE SEEN
Bilirubin Urine: NEGATIVE
Glucose, UA: NEGATIVE mg/dL
Ketones, ur: 20 mg/dL — AB
Nitrite: NEGATIVE
Protein, ur: 30 mg/dL — AB
Specific Gravity, Urine: 1.01 (ref 1.005–1.030)
pH: 6 (ref 5.0–8.0)

## 2022-08-18 LAB — COMPREHENSIVE METABOLIC PANEL
ALT: 14 U/L (ref 0–44)
AST: 27 U/L (ref 15–41)
Albumin: 4.2 g/dL (ref 3.5–5.0)
Alkaline Phosphatase: 46 U/L (ref 38–126)
Anion gap: 14 (ref 5–15)
BUN: 15 mg/dL (ref 8–23)
CO2: 23 mmol/L (ref 22–32)
Calcium: 9.2 mg/dL (ref 8.9–10.3)
Chloride: 84 mmol/L — ABNORMAL LOW (ref 98–111)
Creatinine, Ser: 0.54 mg/dL (ref 0.44–1.00)
GFR, Estimated: >60 mL/min (ref >60)
Glucose, Bld: 121 mg/dL — ABNORMAL HIGH (ref 70–99)
Potassium: 4.2 mmol/L (ref 3.5–5.1)
Sodium: 121 mmol/L — ABNORMAL LOW (ref 135–145)
Total Bilirubin: 1.2 mg/dL (ref 0.3–1.2)
Total Protein: 8 g/dL (ref 6.5–8.1)

## 2022-08-18 LAB — CBC WITH DIFFERENTIAL/PLATELET
Abs Immature Granulocytes: 0.01 10*3/uL (ref 0.00–0.07)
Basophils Absolute: 0 10*3/uL (ref 0.0–0.1)
Basophils Relative: 0 %
Eosinophils Absolute: 0 10*3/uL (ref 0.0–0.5)
Eosinophils Relative: 0 %
HCT: 37.4 % (ref 36.0–46.0)
Hemoglobin: 12.7 g/dL (ref 12.0–15.0)
Immature Granulocytes: 0 %
Lymphocytes Relative: 11 %
Lymphs Abs: 0.6 10*3/uL — ABNORMAL LOW (ref 0.7–4.0)
MCH: 30.2 pg (ref 26.0–34.0)
MCHC: 34 g/dL (ref 30.0–36.0)
MCV: 88.8 fL (ref 80.0–100.0)
Monocytes Absolute: 0.6 10*3/uL (ref 0.1–1.0)
Monocytes Relative: 11 %
Neutro Abs: 3.9 10*3/uL (ref 1.7–7.7)
Neutrophils Relative %: 78 %
Platelets: 268 10*3/uL (ref 150–400)
RBC: 4.21 MIL/uL (ref 3.87–5.11)
RDW: 14.2 % (ref 11.5–15.5)
WBC: 5.1 10*3/uL (ref 4.0–10.5)
nRBC: 0 % (ref 0.0–0.2)

## 2022-08-18 LAB — MAGNESIUM: Magnesium: 1.3 mg/dL — ABNORMAL LOW (ref 1.7–2.4)

## 2022-08-18 LAB — TROPONIN I (HIGH SENSITIVITY)
Troponin I (High Sensitivity): 8 ng/L (ref <18)
Troponin I (High Sensitivity): 7 ng/L (ref <18)

## 2022-08-18 MED ORDER — PIPERACILLIN-TAZOBACTAM 3.375 G IVPB
3.3750 g | Freq: Three times a day (TID) | INTRAVENOUS | Status: DC
  Administered 2022-08-18 – 2022-08-21 (×8): 3.375 g via INTRAVENOUS
  Filled 2022-08-18 (×8): qty 50

## 2022-08-18 MED ORDER — IOHEXOL 300 MG/ML  SOLN
100.0000 mL | Freq: Once | INTRAMUSCULAR | Status: AC | PRN
  Administered 2022-08-18: 100 mL via INTRAVENOUS

## 2022-08-18 MED ORDER — MAGNESIUM SULFATE 2 GM/50ML IV SOLN
2.0000 g | Freq: Once | INTRAVENOUS | Status: AC
  Administered 2022-08-18: 2 g via INTRAVENOUS
  Filled 2022-08-18: qty 50

## 2022-08-18 NOTE — ED Notes (Signed)
Patient transported to CT 

## 2022-08-18 NOTE — ED Triage Notes (Signed)
BIB by EMS with complaints of generalized weakness for two days. Son reports that it started yesterday morning, she improved around bedtime and is the same today. States that she has Dementia with Antony Madura. Son reported to EMS that patient is not ambulatory like normal and normally walks around property. Patient is unable to answer any questions but denies any symptoms with no complaints.

## 2022-08-18 NOTE — ED Provider Notes (Signed)
[facility] EMERGENCY DEPARTMENT Provider Note   CSN: [id number] Arrival date & time: 08/18/22  1727     History  Chief Complaint  Patient presents with   Weakness    [name] is a 88 y.o. female with HTN, paroxysmal A-fib (currently off anticoagulation due to fall risk), T2DM, obesity, CKD stage III, CAD, history of PSVT, gout, OA presents with generalized weakness.   Patient's daughter at bedside, who provides history, reports increased confusion x 2 days. Had two episodes of bowel incontinence. Yesterday had nasal congestion/rhinorrhea but that was gone by this AM. Was difficult for patient to walk this AM and could not follow instructions this AM. Had a transient episode of dysphagia this AM but daughter notes she has swallowed normally since then. Worries she has another UTI as this has presented similarly in the past but seems worse now, as she was unable to walk even with her walker. Son reported to EMS that patient is not ambulatory like normal and normally walks around property. No f/c, no CP. Endorses no nausea or vomiting except did have one episode of vomiting yesterday that daughter thinks was spitting up after she ate too quickly. On my evaluation, patient answers some questions. Knows her name and her daughter at bedside. States she is in pain in her lower abdomen and is unable to urinate. Daughter reports she has been urinating without meaning to. Last time she urinated was 1700 PM. Patient is intermittently moaning and c/o abdominal pain which the daughter states is new.   Per chart review, patient was admitted from 8/18 to 07/07/2022 for acute metabolic encephalopathy possibly related to hyponatremia and recent UTI.     Weakness      Home Medications Prior to Admission medications   Medication Sig Start Date End Date Taking? Authorizing Provider  acetaminophen (TYLENOL) 500 MG tablet Take 1,000 mg by mouth 2 (two) times daily.    [provider]   atenolol (TENORMIN) 50 MG tablet TAKE 1 TABLET BY MOUTH TWICE A DAY 08/07/21   Satira Sark, MD  cephALEXin (KEFLEX) 250 MG capsule Take 250 mg by mouth 3 (three) times daily. 06/29/22   [provider]  meclizine (ANTIVERT) 25 MG tablet Take 1 tablet by mouth 3 (three) times daily as needed. 04/26/17   [provider]  meloxicam (MOBIC) 7.5 MG tablet Take 7.5 mg by mouth every other day. 02/09/22   [provider]  metFORMIN (GLUCOPHAGE-XR) 500 MG 24 hr tablet Take 2 tabs (1,000mg ) by mouth every morning & 1 tab (500mg ) every evening 03/24/13   [provider]  Multiple Vitamin (MULTIVITAMIN) tablet Take 1 tablet by mouth daily.    [provider]  nitroGLYCERIN (NITROSTAT) 0.4 MG SL tablet Place 1 tablet (0.4 mg total) under the tongue every 5 (five) minutes x 3 doses as needed for chest pain (if no relief after 3rd dose, proceed to the ED for an evaluation or call 911). 07/14/19 05/01/22  Satira Sark, MD  simvastatin (ZOCOR) 10 MG tablet Take 10 mg by mouth at bedtime.    [provider]      Allergies    Toprol xl [metoprolol succinate]    Review of Systems   Review of Systems  Neurological:  Positive for weakness.   Review of systems obtained jointly from both patient and daughter negative for f/c.  A 10 point review of systems was performed and is negative unless otherwise reported in HPI.  Physical Exam  Updated Vital Signs BP (!) 187/111   Pulse 79   Temp 98.5 F (36.9 C) (Oral)   Resp (!) 33   SpO2 97%  Physical Exam General: Normal appearing female, lying in bed.  HEENT: PERRLA, Sclera anicteric, MMM, trachea midline. Cardiology: RRR, no murmurs/rubs/gallops. BL radial and DP pulses equal bilaterally.  Resp: Normal respiratory rate and effort. CTAB, no wheezes, rhonchi, crackles.  Abd: Diffusely tender to palpation, grimacing with light palpation. Soft, non-distended. No rebound tenderness or guarding.  GU:  Deferred. MSK: No peripheral edema or signs of trauma. Extremities without deformity or TTP. No cyanosis or clubbing. Skin: warm, dry. No rashes or lesions. Back: No CVA tenderness Neuro: A&Ox2, following simple commands, CNs II-XII grossly intact. MAEs. Sensation grossly intact.  Psych: Normal mood and affect.   ED Results / Procedures / Treatments   Labs (all labs ordered are listed, but only abnormal results are displayed) Labs Reviewed  CBC WITH DIFFERENTIAL/PLATELET - Abnormal; Notable for the following components:      Result Value   Lymphs Abs 0.6 (*)    All other components within normal limits  COMPREHENSIVE METABOLIC PANEL - Abnormal; Notable for the following components:   Sodium 121 (*)    Chloride 84 (*)    Glucose, Bld 121 (*)    All other components within normal limits  MAGNESIUM - Abnormal; Notable for the following components:   Magnesium 1.3 (*)    All other components within normal limits  URINALYSIS, ROUTINE W REFLEX MICROSCOPIC - Abnormal; Notable for the following components:   Hgb urine dipstick SMALL (*)    Ketones, ur 20 (*)    Protein, ur 30 (*)    Leukocytes,Ua SMALL (*)    All other components within normal limits    EKG EKG Interpretation  Date/Time:  Saturday August 18 2022 18:15:47 EDT Ventricular Rate:  88 PR Interval:    QRS Duration: 89 QT Interval:  371 QTC Calculation: 449 R Axis:   108 Text Interpretation: Atrial fibrillation Right axis deviation Minimal ST depression, diffuse leads Confirmed by Cindee Lame [phone]) on 08/18/2022 8:10:28 PM  Radiology  CXR 08/18/22: Cardiomegaly with mild, diffuse bilateral interstitial pulmonary opacity, likely edema. No focal airspace opacity.  CT a/p w/ contrast: Possible mild sigmoid diverticulitis. Atelectasis or infiltrates in the left lower lobe. Aortic Atherosclerosis (ICD10-I70.0).   Procedures Procedures    Medications Ordered in ED Medications  magnesium sulfate IVPB 2 g  50 mL (0 g Intravenous Stopped 08/18/22 2239)  iohexol (OMNIPAQUE) 300 MG/ML solution 100 mL (100 mLs Intravenous Contrast Given 08/18/22 2121)    ED Course/ Medical Decision Making/ A&P                          Medical Decision Making Amount and/or Complexity of Data Reviewed Labs: ordered. Decision-making details documented in ED Course. Radiology: ordered. Decision-making details documented in ED Course.  Risk Prescription drug management. Decision regarding hospitalization.   Elderly patient w/ h/o dementia and confusion in s/o UTIs now acting not at her baseline. On my eval, patient c/o abdominal pain to palpation.   DDX for generalized weakness includes but is not limited to:  Infectious processes, severe metabolic derangements or electrolyte abnormalities. Also consider ischemia/ACS, heart failure, anemia, and intracranial/central processes but think these are unlikely given the history and physical exam. Patient with abdominal pain to palpation, consider possible UTI/pyelonephritis, appendicitis, SBO/ileus, cholecystitis, diverticulitis, or mesenteric ischemia. Given that patient cannot provide  reliable history or exam will obtain CT scan of abdomen for further evaluation. Patient claims she cannot urinate but daughter states patient is urinating well and actually frequently, low c/f urinary retention, but possibly this represents urinary hesitancy/frequency c/w UTI.  EKG demonstrates afib w/ no signs of ischemia.   I have personally reviewed and interpreted all labs and imaging.   Clinical Course as of 08/21/22 1237  Sat Aug 18, 2022  2008 DG Chest 2 View Cardiomegaly with mild, diffuse bilateral interstitial pulmonary opacity, likely edema. No focal airspace opacity. [HN]  2008 Patient satting 96% on RA [HN]  2008 Sodium(!): 121 BL 126-132 [HN]  2008 WBC: 5.1 No leukoctysosi [HN]  2008 Hemoglobin: 12.7 No anemia [HN]  2008 Magnesium(!): 1.3 Repleting IV [HN]  2008  Creatinine: 0.54 Normal [HN]  2152 DG Chest 2 View Cardiomegaly with mild, diffuse bilateral interstitial pulmonary opacity, likely edema. No focal airspace opacity. [HN]  2321 CT ABDOMEN PELVIS W CONTRAST Possible mild sigmoid diverticulitis. Atelectasis or infiltrates in the left lower lobe. Aortic Atherosclerosis (ICD10-I70.0). [HN]  2322 WBC: 5.1 No leukocytosis [HN]  2327 D/w surgery who recommends admission w/ hospitalist and zosyn, clear liquid diet.  [HN]    Clinical Course User Index [HN] Audley Hose, MD    Hyponatremia and hypomagnesemic w/ diverticulitis. Urine pending.  Dispo: admit to hospitalist w/ abx and surgery consulted.          Final Clinical Impression(s) / ED Diagnoses Final diagnoses:  Weakness  Hyponatremia  Diverticulitis of colon    Rx / DC Orders ED Discharge Orders     None        This note was created using dictation software, which may contain spelling or grammatical errors.    Audley Hose, MD 08/28/22 [phone]

## 2022-08-19 ENCOUNTER — Other Ambulatory Visit: Payer: Self-pay

## 2022-08-19 ENCOUNTER — Encounter (HOSPITAL_COMMUNITY): Payer: Self-pay | Admitting: Family Medicine

## 2022-08-19 DIAGNOSIS — G9341 Metabolic encephalopathy: Secondary | ICD-10-CM | POA: Diagnosis not present

## 2022-08-19 DIAGNOSIS — K5732 Diverticulitis of large intestine without perforation or abscess without bleeding: Secondary | ICD-10-CM | POA: Diagnosis not present

## 2022-08-19 LAB — CBC WITH DIFFERENTIAL/PLATELET
Abs Immature Granulocytes: 0.01 10*3/uL (ref 0.00–0.07)
Basophils Absolute: 0 10*3/uL (ref 0.0–0.1)
Basophils Relative: 0 %
Eosinophils Absolute: 0 10*3/uL (ref 0.0–0.5)
Eosinophils Relative: 0 %
HCT: 35 % — ABNORMAL LOW (ref 36.0–46.0)
Hemoglobin: 11.9 g/dL — ABNORMAL LOW (ref 12.0–15.0)
Immature Granulocytes: 0 %
Lymphocytes Relative: 6 %
Lymphs Abs: 0.3 10*3/uL — ABNORMAL LOW (ref 0.7–4.0)
MCH: 29.8 pg (ref 26.0–34.0)
MCHC: 34 g/dL (ref 30.0–36.0)
MCV: 87.5 fL (ref 80.0–100.0)
Monocytes Absolute: 0.7 10*3/uL (ref 0.1–1.0)
Monocytes Relative: 12 %
Neutro Abs: 4.4 10*3/uL (ref 1.7–7.7)
Neutrophils Relative %: 82 %
Platelets: 254 10*3/uL (ref 150–400)
RBC: 4 MIL/uL (ref 3.87–5.11)
RDW: 14.1 % (ref 11.5–15.5)
WBC: 5.4 10*3/uL (ref 4.0–10.5)
nRBC: 0 % (ref 0.0–0.2)

## 2022-08-19 LAB — BASIC METABOLIC PANEL
Anion gap: 11 (ref 5–15)
Anion gap: 11 (ref 5–15)
BUN: 13 mg/dL (ref 8–23)
BUN: 15 mg/dL (ref 8–23)
CO2: 21 mmol/L — ABNORMAL LOW (ref 22–32)
CO2: 20 mmol/L — ABNORMAL LOW (ref 22–32)
Calcium: 8.6 mg/dL — ABNORMAL LOW (ref 8.9–10.3)
Calcium: 8.6 mg/dL — ABNORMAL LOW (ref 8.9–10.3)
Chloride: 88 mmol/L — ABNORMAL LOW (ref 98–111)
Chloride: 90 mmol/L — ABNORMAL LOW (ref 98–111)
Creatinine, Ser: 0.78 mg/dL (ref 0.44–1.00)
Creatinine, Ser: 0.68 mg/dL (ref 0.44–1.00)
GFR, Estimated: >60 mL/min (ref >60)
GFR, Estimated: >60 mL/min (ref >60)
Glucose, Bld: 142 mg/dL — ABNORMAL HIGH (ref 70–99)
Glucose, Bld: 223 mg/dL — ABNORMAL HIGH (ref 70–99)
Potassium: 3.8 mmol/L (ref 3.5–5.1)
Potassium: 3.8 mmol/L (ref 3.5–5.1)
Sodium: 120 mmol/L — ABNORMAL LOW (ref 135–145)
Sodium: 121 mmol/L — ABNORMAL LOW (ref 135–145)

## 2022-08-19 LAB — GLUCOSE, CAPILLARY
Glucose-Capillary: 187 mg/dL — ABNORMAL HIGH (ref 70–99)
Glucose-Capillary: 140 mg/dL — ABNORMAL HIGH (ref 70–99)

## 2022-08-19 LAB — MAGNESIUM: Magnesium: 1.9 mg/dL (ref 1.7–2.4)

## 2022-08-19 MED ORDER — MORPHINE SULFATE (PF) 2 MG/ML IV SOLN: 0.5000 mg | INTRAVENOUS | Status: DC | PRN

## 2022-08-19 MED ORDER — INSULIN ASPART 100 UNIT/ML IJ SOLN
0.0000 [IU] | Freq: Three times a day (TID) | INTRAMUSCULAR | Status: DC
  Administered 2022-08-19: 2 [IU] via SUBCUTANEOUS
  Administered 2022-08-20: 1 [IU] via SUBCUTANEOUS
  Administered 2022-08-20 – 2022-08-21 (×3): 2 [IU] via SUBCUTANEOUS
  Administered 2022-08-21: 1 [IU] via SUBCUTANEOUS

## 2022-08-19 MED ORDER — HYDRALAZINE HCL 20 MG/ML IJ SOLN
10.0000 mg | Freq: Once | INTRAMUSCULAR | Status: AC
  Administered 2022-08-19: 10 mg via INTRAVENOUS
  Filled 2022-08-19: qty 1

## 2022-08-19 MED ORDER — ONDANSETRON HCL 4 MG PO TABS: 4.0000 mg | ORAL_TABLET | Freq: Four times a day (QID) | ORAL | Status: DC | PRN

## 2022-08-19 MED ORDER — MORPHINE SULFATE (PF) 4 MG/ML IV SOLN
4.0000 mg | Freq: Once | INTRAVENOUS | Status: AC
  Administered 2022-08-19: 4 mg via INTRAVENOUS
  Filled 2022-08-19: qty 1

## 2022-08-19 MED ORDER — ACETAMINOPHEN 650 MG RE SUPP: 650.0000 mg | Freq: Four times a day (QID) | RECTAL | Status: DC | PRN

## 2022-08-19 MED ORDER — ATENOLOL 25 MG PO TABS
50.0000 mg | ORAL_TABLET | Freq: Two times a day (BID) | ORAL | Status: DC
  Administered 2022-08-19 – 2022-08-21 (×5): 50 mg via ORAL
  Filled 2022-08-19 (×5): qty 2

## 2022-08-19 MED ORDER — MORPHINE SULFATE (PF) 2 MG/ML IV SOLN: 2.0000 mg | INTRAVENOUS | Status: DC | PRN

## 2022-08-19 MED ORDER — OXYCODONE HCL 5 MG PO TABS: 5.0000 mg | ORAL_TABLET | ORAL | Status: DC | PRN

## 2022-08-19 MED ORDER — ONDANSETRON HCL 4 MG/2ML IJ SOLN: 4.0000 mg | Freq: Four times a day (QID) | INTRAMUSCULAR | Status: DC | PRN

## 2022-08-19 MED ORDER — MAGNESIUM SULFATE 2 GM/50ML IV SOLN
2.0000 g | Freq: Once | INTRAVENOUS | Status: AC
  Administered 2022-08-19: 2 g via INTRAVENOUS
  Filled 2022-08-19: qty 50

## 2022-08-19 MED ORDER — HEPARIN SODIUM (PORCINE) 5000 UNIT/ML IJ SOLN
5000.0000 [IU] | Freq: Three times a day (TID) | INTRAMUSCULAR | Status: DC
  Administered 2022-08-19 – 2022-08-21 (×7): 5000 [IU] via SUBCUTANEOUS
  Filled 2022-08-19 (×7): qty 1

## 2022-08-19 MED ORDER — SIMVASTATIN 20 MG PO TABS
10.0000 mg | ORAL_TABLET | Freq: Every day | ORAL | Status: DC
  Administered 2022-08-19 – 2022-08-20 (×2): 10 mg via ORAL
  Filled 2022-08-19 (×2): qty 1

## 2022-08-19 MED ORDER — SODIUM CHLORIDE 0.9 % IV SOLN: INTRAVENOUS | Status: DC

## 2022-08-19 MED ORDER — ACETAMINOPHEN 325 MG PO TABS
650.0000 mg | ORAL_TABLET | Freq: Four times a day (QID) | ORAL | Status: DC | PRN
  Administered 2022-08-19: 650 mg via ORAL
  Filled 2022-08-19: qty 2

## 2022-08-19 NOTE — Assessment & Plan Note (Signed)
-   Multifactorial - Hyponatremia 121, appears highest in the recent future was 130, contributing - Hypomagnesemia contributing - UTI and diverticulitis contributing - Anticipate improvement with treatment of these underlying problems

## 2022-08-19 NOTE — Progress Notes (Signed)
ASSUMPTION OF CARE NOTE   08/19/2022 1:21 PM  [name] was seen and examined.  The H&P by the admitting provider, orders, imaging was reviewed.  Please see new orders.  Will continue to follow.   Vitals:   08/19/22 0147 08/19/22 0455  BP: 111/70 128/73  Pulse: 92 85  Resp: 20 15  Temp: 98.4 F (36.9 C) 98.1 F (36.7 C)  SpO2: 97% 97%    Results for orders placed or performed during the hospital encounter of 08/18/22  CBC with Differential  Result Value Ref Range   WBC 5.1 4.0 - 10.5 K/uL   RBC 4.21 3.87 - 5.11 MIL/uL   Hemoglobin 12.7 12.0 - 15.0 g/dL   HCT 37.4 36.0 - 46.0 %   MCV 88.8 80.0 - 100.0 fL   MCH 30.2 26.0 - 34.0 pg   MCHC 34.0 30.0 - 36.0 g/dL   RDW 14.2 11.5 - 15.5 %   Platelets 268 150 - 400 K/uL   nRBC 0.0 0.0 - 0.2 %   Neutrophils Relative % 78 %   Neutro Abs 3.9 1.7 - 7.7 K/uL   Lymphocytes Relative 11 %   Lymphs Abs 0.6 (L) 0.7 - 4.0 K/uL   Monocytes Relative 11 %   Monocytes Absolute 0.6 0.1 - 1.0 K/uL   Eosinophils Relative 0 %   Eosinophils Absolute 0.0 0.0 - 0.5 K/uL   Basophils Relative 0 %   Basophils Absolute 0.0 0.0 - 0.1 K/uL   Immature Granulocytes 0 %   Abs Immature Granulocytes 0.01 0.00 - 0.07 K/uL  Comprehensive metabolic panel  Result Value Ref Range   Sodium 121 (L) 135 - 145 mmol/L   Potassium 4.2 3.5 - 5.1 mmol/L   Chloride 84 (L) 98 - 111 mmol/L   CO2 23 22 - 32 mmol/L   Glucose, Bld 121 (H) 70 - 99 mg/dL   BUN 15 8 - 23 mg/dL   Creatinine, Ser 0.54 0.44 - 1.00 mg/dL   Calcium 9.2 8.9 - 10.3 mg/dL   Total Protein 8.0 6.5 - 8.1 g/dL   Albumin 4.2 3.5 - 5.0 g/dL   AST 27 15 - 41 U/L   ALT 14 0 - 44 U/L   Alkaline Phosphatase 46 38 - 126 U/L   Total Bilirubin 1.2 0.3 - 1.2 mg/dL   GFR, Estimated >60 >60 mL/min   Anion gap 14 5 - 15  Magnesium  Result Value Ref Range   Magnesium 1.3 (L) 1.7 - 2.4 mg/dL  Urinalysis, Routine w reflex microscopic Urine, In & Out Cath  Result Value Ref Range   Color, Urine YELLOW  YELLOW   APPearance CLEAR CLEAR   Specific Gravity, Urine 1.010 1.005 - 1.030   pH 6.0 5.0 - 8.0   Glucose, UA NEGATIVE NEGATIVE mg/dL   Hgb urine dipstick SMALL (A) NEGATIVE   Bilirubin Urine NEGATIVE NEGATIVE   Ketones, ur 20 (A) NEGATIVE mg/dL   Protein, ur 30 (A) NEGATIVE mg/dL   Nitrite NEGATIVE NEGATIVE   Leukocytes,Ua SMALL (A) NEGATIVE   RBC / HPF 0-5 0 - 5 RBC/hpf   WBC, UA 11-20 0 - 5 WBC/hpf   Bacteria, UA NONE SEEN NONE SEEN   Mucus PRESENT   Magnesium  Result Value Ref Range   Magnesium 1.9 1.7 - 2.4 mg/dL  CBC with Differential/Platelet  Result Value Ref Range   WBC 5.4 4.0 - 10.5 K/uL   RBC 4.00 3.87 - 5.11 MIL/uL   Hemoglobin 11.9 (L) 12.0 -  15.0 g/dL   HCT 35.0 (L) 36.0 - 46.0 %   MCV 87.5 80.0 - 100.0 fL   MCH 29.8 26.0 - 34.0 pg   MCHC 34.0 30.0 - 36.0 g/dL   RDW 14.1 11.5 - 15.5 %   Platelets 254 150 - 400 K/uL   nRBC 0.0 0.0 - 0.2 %   Neutrophils Relative % 82 %   Neutro Abs 4.4 1.7 - 7.7 K/uL   Lymphocytes Relative 6 %   Lymphs Abs 0.3 (L) 0.7 - 4.0 K/uL   Monocytes Relative 12 %   Monocytes Absolute 0.7 0.1 - 1.0 K/uL   Eosinophils Relative 0 %   Eosinophils Absolute 0.0 0.0 - 0.5 K/uL   Basophils Relative 0 %   Basophils Absolute 0.0 0.0 - 0.1 K/uL   Immature Granulocytes 0 %   Abs Immature Granulocytes 0.01 0.00 - 0.07 K/uL  Basic metabolic panel  Result Value Ref Range   Sodium 120 (L) 135 - 145 mmol/L   Potassium 3.8 3.5 - 5.1 mmol/L   Chloride 88 (L) 98 - 111 mmol/L   CO2 21 (L) 22 - 32 mmol/L   Glucose, Bld 142 (H) 70 - 99 mg/dL   BUN 13 8 - 23 mg/dL   Creatinine, Ser 0.78 0.44 - 1.00 mg/dL   Calcium 8.6 (L) 8.9 - 10.3 mg/dL   GFR, Estimated >60 >60 mL/min   Anion gap 11 5 - 15  Basic metabolic panel  Result Value Ref Range   Sodium 121 (L) 135 - 145 mmol/L   Potassium 3.8 3.5 - 5.1 mmol/L   Chloride 90 (L) 98 - 111 mmol/L   CO2 20 (L) 22 - 32 mmol/L   Glucose, Bld 223 (H) 70 - 99 mg/dL   BUN 15 8 - 23 mg/dL   Creatinine, Ser  0.68 0.44 - 1.00 mg/dL   Calcium 8.6 (L) 8.9 - 10.3 mg/dL   GFR, Estimated >60 >60 mL/min   Anion gap 11 5 - 15  Troponin I (High Sensitivity)  Result Value Ref Range   Troponin I (High Sensitivity) 8 <18 ng/L  Troponin I (High Sensitivity)  Result Value Ref Range   Troponin I (High Sensitivity) 7 <18 ng/L     C. Wynetta Emery, MD [facility]   08/18/2022  5:36 PM How to contact the [facility] Attending or Consulting provider [place] or covering provider during after hours [place], for this patient?  Check the care team in [facility] and look for a) attending/consulting [facility] provider listed and b) the [facility] team listed Log into [email or web address] and use [place]'s universal password to access. If you do not have the password, please contact the hospital operator. Locate the [facility] provider you are looking for under [facility] and page to a number that you can be directly reached. If you still have difficulty reaching the provider, please page the [facility] (Director on Call) for the Hospitalists listed on amion for assistance.

## 2022-08-19 NOTE — TOC Progression Note (Signed)
  Transition of Care [facility]) Screening Note   Patient Details  Name: [name] Date of Birth: 07-23-1927   Transition of Care [facility]) CM/SW Contact:    Shade Flood, LCSW Phone Number: 08/19/2022, 11:13 AM    Transition of Care Department [facility]) has reviewed patient and no TOC needs have been identified at this time. We will continue to monitor patient advancement through interdisciplinary progression rounds. If new patient transition needs arise, please place a TOC consult.

## 2022-08-19 NOTE — Assessment & Plan Note (Signed)
Continue atenolol. 

## 2022-08-19 NOTE — Plan of Care (Signed)
  Problem: Education: Goal: Knowledge of General Education information will improve Description Including pain rating scale, medication(s)/side effects and non-pharmacologic comfort measures Outcome: Progressing   Problem: Health Behavior/Discharge Planning: Goal: Ability to manage health-related needs will improve Outcome: Progressing   

## 2022-08-19 NOTE — Assessment & Plan Note (Signed)
-   Continue beta-blocker - Not on anticoagulation at home

## 2022-08-19 NOTE — Assessment & Plan Note (Signed)
-   Likely due to poor p.o. intake - Sodium at 121 from 130 baseline - treated with IV hydration - improved up to 130 with IV fluid hydration - this is back at her baseline

## 2022-08-19 NOTE — Assessment & Plan Note (Addendum)
-   4 g magnesium IV given prior to discharge

## 2022-08-19 NOTE — Assessment & Plan Note (Signed)
-   Patient has recurrent UTIs - Treated with Zosyn for diverticulitis which will also cover UTI - Urine culture pending, follow up C&S -- No growth to date - Likely the etiology acute metabolic encephalopathy along with diverticulitis

## 2022-08-19 NOTE — H&P (Signed)
History and Physical    Patient: [name] IEP:[id number] DOB: [birth year] DOA: 08/18/2022 DOS: the patient was seen and examined on 08/19/2022 PCP: Manon Hilding, MD Home Patient coming from: Home  Chief Complaint:  Chief Complaint  Patient presents with   Weakness   HPI: [name] is a 88 y.o. female with medical history significant of atrial fibrillation, coronary artery disease, hyperlipidemia, paroxysmal SVT, TIA, type 2 diabetes mellitus is diet controlled, presents the ED with a chief complaint of altered mental status.  Family reports the patient has dementia and she is headed steady decline over the last 2 years.  For the first t year, this decline was kind of gradual, but this last year it seems to be progressing more quickly.  They reported she started having more memory deficits.  She has waxing and waning confusion.  They have noticed that she is more confused and more lethargic when she has a UTI.  Since her last hospitalization her kids have started staying with her 24/7.  Therefore as of Friday she could not even do her own ADLs which is not normal for her.  She had bowel incontinence and urine incontinence on the floor which is not normal for her.  She could not ambulate with her walker as she does at baseline.  She had 1 dark stool that was liquid.  They did notice some either anal or perianal blood when they gave her a bath today.  She does have a history of hemorrhoids.  Patient just was not acting herself so they decided to bring her in.  At baseline she can recognize her children.  She can carry on a conversation about past events but not current events.  And she ambulates with her walker.  Patient has not complained of any fevers, abdominal pain, other pains.  She does have chronic neck pain on the left, but no new pains.  Patient does not smoke does not drink does not use illicit drugs.  Patient was vaccinated for COVID.  Patient is DNR.  History is provided by daughter  as patient is not responding to questions, not following commands at this time. Review of Systems: unable to review all systems due to the inability of the patient to answer questions. Past Medical History:  Diagnosis Date   Atrial fibrillation ([place])    Coronary atherosclerosis of native coronary artery    Nonobstructive   Dyslipidemia    PSVT (paroxysmal supraventricular tachycardia) ([place])    TIA (transient ischemic attack)    Type 2 diabetes mellitus ([place])    Diet controlled   Past Surgical History:  Procedure Laterality Date   ABDOMINAL HYSTERECTOMY     CARPAL TUNNEL RELEASE     x's 2   CHOLECYSTECTOMY     REPLACEMENT TOTAL KNEE  2002 & 2012   SHOULDER SURGERY  2001   Social History:  reports that she has never smoked. She has never used smokeless tobacco. She reports that she does not drink alcohol and does not use drugs.  Allergies  Allergen Reactions   Toprol Xl [Metoprolol Succinate] Other (See Comments)    HAIR LOSS    Family History  Problem Relation Age of Onset   Coronary artery disease Mother     Prior to Admission medications   Medication Sig Start Date End Date Taking? Authorizing Provider  acetaminophen (TYLENOL) 500 MG tablet Take 1,000 mg by mouth 2 (two) times daily.    [provider]  atenolol (TENORMIN)  50 MG tablet TAKE 1 TABLET BY MOUTH TWICE A DAY 08/07/21   Satira Sark, MD  cephALEXin (KEFLEX) 250 MG capsule Take 250 mg by mouth 3 (three) times daily. 06/29/22   [provider]  meclizine (ANTIVERT) 25 MG tablet Take 1 tablet by mouth 3 (three) times daily as needed. 04/26/17   [provider]  meloxicam (MOBIC) 7.5 MG tablet Take 7.5 mg by mouth every other day. 02/09/22   [provider]  metFORMIN (GLUCOPHAGE-XR) 500 MG 24 hr tablet Take 2 tabs (1,000mg ) by mouth every morning & 1 tab (500mg ) every evening 03/24/13   [provider]  Multiple Vitamin (MULTIVITAMIN) tablet Take 1 tablet by mouth daily.     [provider]  nitroGLYCERIN (NITROSTAT) 0.4 MG SL tablet Place 1 tablet (0.4 mg total) under the tongue every 5 (five) minutes x 3 doses as needed for chest pain (if no relief after 3rd dose, proceed to the ED for an evaluation or call 911). 07/14/19 05/01/22  Satira Sark, MD  simvastatin (ZOCOR) 10 MG tablet Take 10 mg by mouth at bedtime.    [provider]    Physical Exam: Vitals:   08/19/22 0105 08/19/22 0147 08/19/22 0147 08/19/22 0455  BP:   111/70 128/73  Pulse: (!) 120  92 85  Resp: 19  20 15   Temp:   98.4 F (36.9 C) 98.1 F (36.7 C)  TempSrc:   Oral Oral  SpO2: 97%  97% 97%  Weight:  61.8 kg    Height:  5\' 3"  (1.6 m)     1.  General: Patient lying supine in bed,  no acute distress   2. Psychiatric: Somnolent, nonverbal, not following commands   3. Neurologic: Not currently following commands, withdraws to painful stimuli, protecting airway, mumbles incoherently to sternal rub   4. HEENMT:  Head is atraumatic, normocephalic, pupils reactive to light, neck is supple, trachea is midline, mucous membranes are moist   5. Respiratory : Lungs are clear to auscultation bilaterally without wheezing, rhonchi, rales, no cyanosis, no increase in work of breathing or accessory muscle use   6. Cardiovascular : Heart rate normal, rhythm is regular, no murmurs, rubs or gallops, no peripheral edema, peripheral pulses palpated   7. Gastrointestinal:  Abdomen is soft, nondistended, tender to palpation in the lower right quadrant with voluntary guarding, bowel sounds active, no masses or organomegaly palpated   8. Skin:  Skin is warm, dry and intact without rashes, acute lesions, or ulcers on limited exam   9.Musculoskeletal:  No acute deformities or trauma, no asymmetry in tone, no peripheral edema, peripheral pulses palpated, no tenderness to palpation in the extremities  Data Reviewed: In the ED Temp 98.5, heart rate 79-97, respiratory rate 17-33,  blood pressure 140/80-187/111, satting 93% No leukocytosis with a white blood cell count of 5.1, hemoglobin 12.7 Chemistry reveals a hyponatremia hypomagnesemia UA is indicative of UTI, urine culture pending CT abdomen pelvis shows sigmoid diverticulitis, and atelectasis of the left lower lobe of the lung General surgery was consulted and recommends admission to hospitalist Admission requested for diverticulitis and acute metabolic encephalopathy  Assessment and Plan: * Acute metabolic encephalopathy - Multifactorial - Hyponatremia 121, appears highest in the recent future was 130, contributing - Hypomagnesemia contributing - UTI and diverticulitis contributing - Anticipate improvement with treatment of these underlying problems  Atrial fibrillation (HCC) - Continue beta-blocker - Not on anticoagulation at home  Dyslipidemia - Continue statin  Acute cystitis without  hematuria - Patient has recurrent UTIs - On Zosyn for diverticulitis which will also cover UTI - Urine culture pending - Likely the etiology behind acute metabolic encephalopathy  Hypomagnesemia - 2 g magnesium replaced in ED - Trend in the a.m.  Hyponatremia - Likely due to poor p.o. intake - Encourage p.o. intake when patient is more alert - Sodium at 121 from 130 - Continue IV hydration - Trend every 6 hours - Urine electrolytes - Continue to monitor  Essential hypertension, benign - Continue atenolol      Advance Care Planning:   Code Status: DNR   Consults: General surgery  Family Communication: Daughter at bedside  Severity of Illness: The appropriate patient status for this patient is INPATIENT. Inpatient status is judged to be reasonable and necessary in order to provide the required intensity of service to ensure the patient's safety. The patient's presenting symptoms, physical exam findings, and initial radiographic and laboratory data in the context of their chronic comorbidities is felt  to place them at high risk for further clinical deterioration. Furthermore, it is not anticipated that the patient will be medically stable for discharge from the hospital within 2 midnights of admission.   * I certify that at the point of admission it is my clinical judgment that the patient will require inpatient hospital care spanning beyond 2 midnights from the point of admission due to high intensity of service, high risk for further deterioration and high frequency of surveillance required.*  Author: Rolla Plate, DO 08/19/2022 5:17 AM  For on call review [email or web address].

## 2022-08-19 NOTE — Assessment & Plan Note (Signed)
Continue statin. 

## 2022-08-19 NOTE — Consult Note (Signed)
Reason for Consult: Sigmoid diverticulitis Referring Physician: Dr. [name] is an 88 y.o. female.  HPI: Patient is a 88 year old white female with a history of atrial fibrillation, coronary artery disease, paroxysmal SVT, type 2 diabetes, and dementia who was noticed by family members to be more confused and lethargic.  She did have a dark stool.  She was brought to the emergency room for further evaluation and treatment.  A CT scan of the abdomen pelvis revealed mild sigmoid diverticulitis without perforation or complication.  Patient was admitted to the hospital for failure to thrive and further management and treatment.  History was obtained from the chart and a family member at bedside.  The family member states that this is her first recent episode of being diagnosed with diverticulitis.  She does have a known history of diverticulosis.  Past Medical History:  Diagnosis Date   Atrial fibrillation ([place])    Coronary atherosclerosis of native coronary artery    Nonobstructive   Dyslipidemia    PSVT (paroxysmal supraventricular tachycardia) ([facility])    TIA (transient ischemic attack)    Type 2 diabetes mellitus ([place])    Diet controlled    Past Surgical History:  Procedure Laterality Date   ABDOMINAL HYSTERECTOMY     CARPAL TUNNEL RELEASE     x's 2   CHOLECYSTECTOMY     REPLACEMENT TOTAL KNEE  2002 & 2012   SHOULDER SURGERY  2001    Family History  Problem Relation Age of Onset   Coronary artery disease Mother     Social History:  reports that she has never smoked. She has never used smokeless tobacco. She reports that she does not drink alcohol and does not use drugs.  Allergies:  Allergies  Allergen Reactions   Toprol Xl [Metoprolol Succinate] Other (See Comments)    HAIR LOSS    Medications:  I have reviewed the patient's current medications.  Results for orders placed or performed during the hospital encounter of 08/18/22 (from the past 48 hour(s))   Urinalysis, Routine w reflex microscopic Urine, In & Out Cath     Status: Abnormal   Collection Time: 08/18/22  5:58 PM  Result Value Ref Range   Color, Urine YELLOW YELLOW   APPearance CLEAR CLEAR   Specific Gravity, Urine 1.010 1.005 - 1.030   pH 6.0 5.0 - 8.0   Glucose, UA NEGATIVE NEGATIVE mg/dL   Hgb urine dipstick SMALL (A) NEGATIVE   Bilirubin Urine NEGATIVE NEGATIVE   Ketones, ur 20 (A) NEGATIVE mg/dL   Protein, ur 30 (A) NEGATIVE mg/dL   Nitrite NEGATIVE NEGATIVE   Leukocytes,Ua SMALL (A) NEGATIVE   RBC / HPF 0-5 0 - 5 RBC/hpf   WBC, UA 11-20 0 - 5 WBC/hpf   Bacteria, UA NONE SEEN NONE SEEN   Mucus PRESENT     Comment: Performed at [facility], [address], [place], [place] [address]  CBC with Differential     Status: Abnormal   Collection Time: 08/18/22  6:25 PM  Result Value Ref Range   WBC 5.1 4.0 - 10.5 K/uL   RBC 4.21 3.87 - 5.11 MIL/uL   Hemoglobin 12.7 12.0 - 15.0 g/dL   HCT 37.4 36.0 - 46.0 %   MCV 88.8 80.0 - 100.0 fL   MCH 30.2 26.0 - 34.0 pg   MCHC 34.0 30.0 - 36.0 g/dL   RDW 14.2 11.5 - 15.5 %   Platelets 268 150 - 400 K/uL   nRBC 0.0  0.0 - 0.2 %   Neutrophils Relative % 78 %   Neutro Abs 3.9 1.7 - 7.7 K/uL   Lymphocytes Relative 11 %   Lymphs Abs 0.6 (L) 0.7 - 4.0 K/uL   Monocytes Relative 11 %   Monocytes Absolute 0.6 0.1 - 1.0 K/uL   Eosinophils Relative 0 %   Eosinophils Absolute 0.0 0.0 - 0.5 K/uL   Basophils Relative 0 %   Basophils Absolute 0.0 0.0 - 0.1 K/uL   Immature Granulocytes 0 %   Abs Immature Granulocytes 0.01 0.00 - 0.07 K/uL    Comment: Performed at [facility], [address] [place], [place] [address]  Comprehensive metabolic panel     Status: Abnormal   Collection Time: 08/18/22  6:25 PM  Result Value Ref Range   Sodium 121 (L) 135 - 145 mmol/L   Potassium 4.2 3.5 - 5.1 mmol/L   Chloride 84 (L) 98 - 111 mmol/L   CO2 23 22 - 32 mmol/L   Glucose, Bld 121 (H) 70 - 99 mg/dL    Comment: Glucose reference range applies  only to samples taken after fasting for at least 8 hours.   BUN 15 8 - 23 mg/dL   Creatinine, Ser 0.54 0.44 - 1.00 mg/dL   Calcium 9.2 8.9 - 10.3 mg/dL   Total Protein 8.0 6.5 - 8.1 g/dL   Albumin 4.2 3.5 - 5.0 g/dL   AST 27 15 - 41 U/L   ALT 14 0 - 44 U/L   Alkaline Phosphatase 46 38 - 126 U/L   Total Bilirubin 1.2 0.3 - 1.2 mg/dL   GFR, Estimated >60 >60 mL/min    Comment: (NOTE) Calculated using the CKD-EPI Creatinine Equation (2021)    Anion gap 14 5 - 15    Comment: Performed at [facility], [address], [place], [place] [address]  Magnesium     Status: Abnormal   Collection Time: 08/18/22  6:25 PM  Result Value Ref Range   Magnesium 1.3 (L) 1.7 - 2.4 mg/dL    Comment: Performed at [facility], [address], [place], [place] [address]  Troponin I (High Sensitivity)     Status: None   Collection Time: 08/18/22  6:25 PM  Result Value Ref Range   Troponin I (High Sensitivity) 8 <18 ng/L    Comment: (NOTE) Elevated high sensitivity troponin I (hsTnI) values and significant  changes across serial measurements may suggest ACS but many other  chronic and acute conditions are known to elevate hsTnI results.  Refer to the "Links" section for chest pain algorithms and additional  guidance. Performed at [facility], [address] [place], [place] [address]   Troponin I (High Sensitivity)     Status: None   Collection Time: 08/18/22 10:07 PM  Result Value Ref Range   Troponin I (High Sensitivity) 7 <18 ng/L    Comment: (NOTE) Elevated high sensitivity troponin I (hsTnI) values and significant  changes across serial measurements may suggest ACS but many other  chronic and acute conditions are known to elevate hsTnI results.  Refer to the "Links" section for chest pain algorithms and additional  guidance. Performed at [facility], [address] [place], [place] [address]   Magnesium     Status: None   Collection Time: 08/19/22  4:38 AM  Result Value Ref Range    Magnesium 1.9 1.7 - 2.4 mg/dL    Comment: Performed at [facility], [address], [place], [place] [address]  CBC with  Differential/Platelet     Status: Abnormal   Collection Time: 08/19/22  4:38 AM  Result Value Ref Range   WBC 5.4 4.0 - 10.5 K/uL   RBC 4.00 3.87 - 5.11 MIL/uL   Hemoglobin 11.9 (L) 12.0 - 15.0 g/dL   HCT 35.0 (L) 36.0 - 46.0 %   MCV 87.5 80.0 - 100.0 fL   MCH 29.8 26.0 - 34.0 pg   MCHC 34.0 30.0 - 36.0 g/dL   RDW 14.1 11.5 - 15.5 %   Platelets 254 150 - 400 K/uL   nRBC 0.0 0.0 - 0.2 %   Neutrophils Relative % 82 %   Neutro Abs 4.4 1.7 - 7.7 K/uL   Lymphocytes Relative 6 %   Lymphs Abs 0.3 (L) 0.7 - 4.0 K/uL   Monocytes Relative 12 %   Monocytes Absolute 0.7 0.1 - 1.0 K/uL   Eosinophils Relative 0 %   Eosinophils Absolute 0.0 0.0 - 0.5 K/uL   Basophils Relative 0 %   Basophils Absolute 0.0 0.0 - 0.1 K/uL   Immature Granulocytes 0 %   Abs Immature Granulocytes 0.01 0.00 - 0.07 K/uL    Comment: Performed at [facility], [address], [place], [place] [address]    CT ABDOMEN PELVIS W CONTRAST  Result Date: 08/18/2022 CLINICAL DATA:  Generalized weakness and abdominal pain EXAM: CT ABDOMEN AND PELVIS WITH CONTRAST TECHNIQUE: Multidetector CT imaging of the abdomen and pelvis was performed using the standard protocol following bolus administration of intravenous contrast. RADIATION DOSE REDUCTION: This exam was performed according to the departmental dose-optimization program which includes automated exposure control, adjustment of the mA and/or kV according to patient size and/or use of iterative reconstruction technique. CONTRAST:  177mL OMNIPAQUE IOHEXOL 300 MG/ML  SOLN COMPARISON:  CT abdomen and pelvis 05/24/2014 FINDINGS: Lower chest: Coronary artery and mitral annular calcification. Normal heart size. No pericardial effusion. Atelectasis or infiltrates in the left lower lobe. Hepatobiliary: Stable hemangiomas in the liver compared with 05/24/2014. No suspicious  focal lesion. Cholecystectomy. Dilation of the common bile duct is unchanged and likely due to reservoir effect. Pancreas: Unremarkable. No pancreatic ductal dilatation or surrounding inflammatory changes. Spleen: Normal in size without focal abnormality. Adrenals/Urinary Tract: Unchanged adrenal nodules bilaterally likely representing adenomas. No follow-up required. Low-attenuation lesions in the kidneys are statistically likely to represent cysts. No follow-up is required. Unremarkable bladder. Stomach/Bowel: Normal caliber large and small bowel. Colonic diverticulosis. Question mild wall thickening about the sigmoid colon. Small amount of adjacent fluid. Normal appendix. Small hiatal hernia. Stomach is unremarkable. Vascular/Lymphatic: Aortic atherosclerosis. No enlarged abdominal or pelvic lymph nodes. Reproductive: Status post hysterectomy. No adnexal masses. Other: Small amount of free fluid in the pelvis. No free intraperitoneal air. Musculoskeletal: Scoliosis and thoracolumbar spondylosis. No acute abnormality. IMPRESSION: Possible mild sigmoid diverticulitis. Atelectasis or infiltrates in the left lower lobe. Aortic Atherosclerosis (ICD10-I70.0). Electronically Signed   By: Placido Sou M.D.   On: 08/18/2022 22:05   DG Chest 2 View  Result Date: 08/18/2022 CLINICAL DATA:  Weakness, tachypnea EXAM: CHEST - 2 VIEW COMPARISON:  12/02/2021 FINDINGS: Cardiomegaly. Mild, diffuse bilateral interstitial pulmonary opacity. Disc degenerative disease of the thoracic spine. IMPRESSION: Cardiomegaly with mild, diffuse bilateral interstitial pulmonary opacity, likely edema. No focal airspace opacity. Electronically Signed   By: Delanna Ahmadi M.D.   On: 08/18/2022 18:44    ROS:  Review of systems not obtained due to patient factors.  Blood pressure 128/73, pulse 85, temperature 98.1 F (36.7 C), temperature source Oral, resp.  rate 15, height 5\' 3"  (1.6 m), weight 61.8 kg, SpO2 97 %. Physical Exam: Pleasant  white female no acute distress Head is normocephalic, atraumatic Abdomen is soft and flat.  Some fullness and tenderness to palpation noted in the left lower quadrant.  No rigidity is noted.  CT scan images personally reviewed  Assessment/Plan: Mild sigmoid diverticulitis.  No need for acute surgical intervention at the present time.  May advance to full liquid diet.  She obviously has multiple comorbidities that make any surgical intervention high risk.  I suspect her failure to thrive is multifactorial in nature.  This was discussed with the family member, who agrees.  Would suggest a total of 5 to 7-day course of antibiotics.  [name] 08/19/2022, 9:48 AM

## 2022-08-20 DIAGNOSIS — K5732 Diverticulitis of large intestine without perforation or abscess without bleeding: Secondary | ICD-10-CM

## 2022-08-20 DIAGNOSIS — E785 Hyperlipidemia, unspecified: Secondary | ICD-10-CM | POA: Diagnosis not present

## 2022-08-20 DIAGNOSIS — I10 Essential (primary) hypertension: Secondary | ICD-10-CM

## 2022-08-20 DIAGNOSIS — I4891 Unspecified atrial fibrillation: Secondary | ICD-10-CM

## 2022-08-20 DIAGNOSIS — G9341 Metabolic encephalopathy: Secondary | ICD-10-CM | POA: Diagnosis not present

## 2022-08-20 LAB — GLUCOSE, CAPILLARY
Glucose-Capillary: 160 mg/dL — ABNORMAL HIGH (ref 70–99)
Glucose-Capillary: 153 mg/dL — ABNORMAL HIGH (ref 70–99)
Glucose-Capillary: 142 mg/dL — ABNORMAL HIGH (ref 70–99)
Glucose-Capillary: 153 mg/dL — ABNORMAL HIGH (ref 70–99)

## 2022-08-20 LAB — BASIC METABOLIC PANEL
Anion gap: 8 (ref 5–15)
BUN: 12 mg/dL (ref 8–23)
CO2: 23 mmol/L (ref 22–32)
Calcium: 8.3 mg/dL — ABNORMAL LOW (ref 8.9–10.3)
Chloride: 92 mmol/L — ABNORMAL LOW (ref 98–111)
Creatinine, Ser: 0.5 mg/dL (ref 0.44–1.00)
GFR, Estimated: >60 mL/min (ref >60)
Glucose, Bld: 151 mg/dL — ABNORMAL HIGH (ref 70–99)
Potassium: 3.3 mmol/L — ABNORMAL LOW (ref 3.5–5.1)
Sodium: 123 mmol/L — ABNORMAL LOW (ref 135–145)

## 2022-08-20 MED ORDER — POLYETHYLENE GLYCOL 3350 17 G PO PACK
17.0000 g | PACK | Freq: Every day | ORAL | Status: DC
  Administered 2022-08-20: 17 g via ORAL
  Filled 2022-08-20 (×2): qty 1

## 2022-08-20 MED ORDER — POTASSIUM CHLORIDE 10 MEQ/100ML IV SOLN
10.0000 meq | INTRAVENOUS | Status: AC
  Administered 2022-08-20 (×3): 10 meq via INTRAVENOUS
  Filled 2022-08-20 (×3): qty 100

## 2022-08-20 NOTE — Progress Notes (Signed)
Subjective: Patient has no abdominal complaints.  She denies any nausea or vomiting.  Has not had a bowel movement yet.  Objective: Vital signs in last 24 hours: Temp:  [97.8 F (36.6 C)-98.7 F (37.1 C)] 97.8 F (36.6 C) (10/02 0528) Pulse Rate:  [80-94] 80 (10/02 0528) Resp:  [18-20] 20 (10/02 0528) BP: (110-166)/(69-106) 166/106 (10/02 0528) SpO2:  [97 %-98 %] 97 % (10/02 0528) Last BM Date : 08/18/22  Intake/Output from previous day: 10/01 0701 - 10/02 0700 In: 1537.5 [P.O.:960; I.V.:540.7; IV Piggyback:36.9] Out: 1600 [Urine:1600] Intake/Output this shift: Total I/O In: -  Out: 600 [Urine:600]  General appearance: alert, cooperative, and no distress GI: soft, non-tender; bowel sounds normal; no masses,  no organomegaly  Lab Results:  Recent Labs    08/18/22 1825 08/19/22 0438  WBC 5.1 5.4  HGB 12.7 11.9*  HCT 37.4 35.0*  PLT 268 254   BMET Recent Labs    08/19/22 1101 08/20/22 0646  NA 121* 123*  K 3.8 3.3*  CL 90* 92*  CO2 20* 23  GLUCOSE 223* 151*  BUN 15 12  CREATININE 0.68 0.50  CALCIUM 8.6* 8.3*   PT/INR No results for input(s): "LABPROT", "INR" in the last 72 hours.  Studies/Results: CT ABDOMEN PELVIS W CONTRAST  Result Date: 08/18/2022 CLINICAL DATA:  Generalized weakness and abdominal pain EXAM: CT ABDOMEN AND PELVIS WITH CONTRAST TECHNIQUE: Multidetector CT imaging of the abdomen and pelvis was performed using the standard protocol following bolus administration of intravenous contrast. RADIATION DOSE REDUCTION: This exam was performed according to the departmental dose-optimization program which includes automated exposure control, adjustment of the mA and/or kV according to patient size and/or use of iterative reconstruction technique. CONTRAST:  151mL OMNIPAQUE IOHEXOL 300 MG/ML  SOLN COMPARISON:  CT abdomen and pelvis 05/24/2014 FINDINGS: Lower chest: Coronary artery and mitral annular calcification. Normal heart size. No pericardial  effusion. Atelectasis or infiltrates in the left lower lobe. Hepatobiliary: Stable hemangiomas in the liver compared with 05/24/2014. No suspicious focal lesion. Cholecystectomy. Dilation of the common bile duct is unchanged and likely due to reservoir effect. Pancreas: Unremarkable. No pancreatic ductal dilatation or surrounding inflammatory changes. Spleen: Normal in size without focal abnormality. Adrenals/Urinary Tract: Unchanged adrenal nodules bilaterally likely representing adenomas. No follow-up required. Low-attenuation lesions in the kidneys are statistically likely to represent cysts. No follow-up is required. Unremarkable bladder. Stomach/Bowel: Normal caliber large and small bowel. Colonic diverticulosis. Question mild wall thickening about the sigmoid colon. Small amount of adjacent fluid. Normal appendix. Small hiatal hernia. Stomach is unremarkable. Vascular/Lymphatic: Aortic atherosclerosis. No enlarged abdominal or pelvic lymph nodes. Reproductive: Status post hysterectomy. No adnexal masses. Other: Small amount of free fluid in the pelvis. No free intraperitoneal air. Musculoskeletal: Scoliosis and thoracolumbar spondylosis. No acute abnormality. IMPRESSION: Possible mild sigmoid diverticulitis. Atelectasis or infiltrates in the left lower lobe. Aortic Atherosclerosis (ICD10-I70.0). Electronically Signed   By: Placido Sou M.D.   On: 08/18/2022 22:05   DG Chest 2 View  Result Date: 08/18/2022 CLINICAL DATA:  Weakness, tachypnea EXAM: CHEST - 2 VIEW COMPARISON:  12/02/2021 FINDINGS: Cardiomegaly. Mild, diffuse bilateral interstitial pulmonary opacity. Disc degenerative disease of the thoracic spine. IMPRESSION: Cardiomegaly with mild, diffuse bilateral interstitial pulmonary opacity, likely edema. No focal airspace opacity. Electronically Signed   By: Delanna Ahmadi M.D.   On: 08/18/2022 18:44    Anti-infectives: Anti-infectives (From admission, onward)    Start     Dose/Rate Route  Frequency Ordered Stop   08/18/22 2330  piperacillin-tazobactam (  ZOSYN) IVPB 3.375 g        3.375 g 12.5 mL/hr over 240 Minutes Intravenous Every 8 hours 08/18/22 2327         Assessment/Plan:  Impression: Mild sigmoid diverticulitis, clinically resolving.  No need for acute surgical intervention at this time.  Patient is already been advanced to a carb modified diet.  We will add MiraLAX to her bowel regimen.  At this point, no surgical follow-up needed.  LOS: 2 days    Aviva Signs 08/20/2022

## 2022-08-20 NOTE — Evaluation (Signed)
Clinical/Bedside Swallow Evaluation Patient Details  Name: [name] MRN: [id number] Date of Birth: 24-Jan-1927  Today's Date: 08/20/2022 Time: SLP Start Time (ACUTE ONLY): 84 SLP Stop Time (ACUTE ONLY): 2694 SLP Time Calculation (min) (ACUTE ONLY): 22 min  Past Medical History:  Past Medical History:  Diagnosis Date   Atrial fibrillation ([place])    Coronary atherosclerosis of native coronary artery    Nonobstructive   Dyslipidemia    PSVT (paroxysmal supraventricular tachycardia)    TIA (transient ischemic attack)    Type 2 diabetes mellitus ([place])    Diet controlled   Past Surgical History:  Past Surgical History:  Procedure Laterality Date   ABDOMINAL HYSTERECTOMY     CARPAL TUNNEL RELEASE     x's 2   CHOLECYSTECTOMY     REPLACEMENT TOTAL KNEE  2002 & 2012   SHOULDER SURGERY  2001   HPI:  Patient is a 88 year old white female with a history of atrial fibrillation, coronary artery disease, paroxysmal SVT, type 2 diabetes, and dementia who was noticed by family members to be more confused and lethargic.  She did have a dark stool.  She was brought to the emergency room for further evaluation and treatment.  A CT scan of the abdomen pelvis revealed mild sigmoid diverticulitis without perforation or complication.  Patient was admitted to the hospital for failure to thrive and further management and treatment. BSE requested.    Assessment / Plan / Recommendation  Clinical Impression  Clinical swallow evaluation completed at bedside. Pt alert, pleasant, and confused. Oral motor examination is WNL. Pt with sparse lower dentition. Pt with a strong, congested volitional cough. She exhibits no overt signs or symptoms of reduced airway protection and no reports of globus. Her PO intake was limited, however appears functional. She indicates that she did not eat very much of her lunch due to reduced appetite. Recommend continue diet as ordered of regular textures and thin liquids with  standard aspiration and reflux precautions. No further SLP services indicated at this time. SLP will sign off. Above discussed with RN. SLP Visit Diagnosis: Dysphagia, unspecified (R13.10)    Aspiration Risk  No limitations    Diet Recommendation Regular;Thin liquid   Liquid Administration via: Cup;Straw Medication Administration: Whole meds with liquid Supervision: Patient able to self feed;Intermittent supervision to cue for compensatory strategies Postural Changes: Seated upright at 90 degrees;Remain upright for at least 30 minutes after po intake    Other  Recommendations Oral Care Recommendations: Oral care BID Other Recommendations: Clarify dietary restrictions    Recommendations for follow up therapy are one component of a multi-disciplinary discharge planning process, led by the attending physician.  Recommendations may be updated based on patient status, additional functional criteria and insurance authorization.  Follow up Recommendations No SLP follow up      Assistance Recommended at Discharge None  Functional Status Assessment Patient has not had a recent decline in their functional status  Frequency and Duration            Prognosis        Swallow Study   General Date of Onset: 08/18/22 HPI: Patient is a 88 year old white female with a history of atrial fibrillation, coronary artery disease, paroxysmal SVT, type 2 diabetes, and dementia who was noticed by family members to be more confused and lethargic.  She did have a dark stool.  She was brought to the emergency room for further evaluation and treatment.  A CT scan of the abdomen pelvis revealed  mild sigmoid diverticulitis without perforation or complication.  Patient was admitted to the hospital for failure to thrive and further management and treatment. BSE requested. Type of Study: Bedside Swallow Evaluation Previous Swallow Assessment: N/A Diet Prior to this Study: Regular;Thin liquids Temperature Spikes  Noted: No Respiratory Status: Room air History of Recent Intubation: No Behavior/Cognition: Alert;Cooperative;Pleasant mood Oral Cavity Assessment: Within Functional Limits Oral Care Completed by SLP: Yes Oral Cavity - Dentition: Poor condition Vision: Functional for self-feeding Self-Feeding Abilities: Able to feed self Patient Positioning: Upright in bed Baseline Vocal Quality: Normal Volitional Cough: Strong;Congested Volitional Swallow: Able to elicit    Oral/Motor/Sensory Function Overall Oral Motor/Sensory Function: Within functional limits   Ice Chips Ice chips: Within functional limits Presentation: Spoon   Thin Liquid Thin Liquid: Within functional limits Presentation: Cup;Self Fed;Straw    Nectar Thick Nectar Thick Liquid: Not tested   Honey Thick Honey Thick Liquid: Not tested   Puree Puree: Within functional limits Presentation: Spoon   Solid     Solid: Within functional limits Presentation: Self Fed     Thank you,  Genene Churn, [place]  Jaycelyn Orrison 08/20/2022,4:09 PM

## 2022-08-20 NOTE — [place] FL2 (Signed)
[place] MEDICAID FL2 LEVEL OF CARE SCREENING TOOL     IDENTIFICATION  Patient Name: [name] Birthdate: 07-16-1927 Sex: female Admission Date (Current Location): 08/18/2022  [facility] and [place] Number:  [employer] and Address:  [facility] [address], [place]      Provider Number: [id number]  Attending Physician Name and Address:  Murlean Iba, MD  Relative Name and Phone Number:       Current Level of Care: Hospital Recommended Level of Care: [place] Prior Approval Number:    Date Approved/Denied:   PASRR Number: [id number]  Discharge Plan: SNF    Current Diagnoses: Patient Active Problem List   Diagnosis Date Noted   Diverticulitis of colon    Pressure injury of skin 08/18/2022   Atrial fibrillation ([place])    Dyslipidemia    Type 2 diabetes mellitus ([place])    Hypomagnesemia 05/03/2022   Anemia of chronic disease 05/03/2022   B12 deficiency 05/03/2022   Hyponatremia 05/02/2022   Paroxysmal atrial fibrillation ([place]) 05/02/2022   UTI (urinary tract infection) 17/49/4496   Acute metabolic encephalopathy 75/91/6384   Acute cystitis without hematuria 12/01/2021   Current use of long term anticoagulation 08/15/2021   Generalized weakness 08/15/2021   Hyperpotassemia 07/25/2017   Body mass index 29.0-29.9, adult 07/12/2017   Low back pain 07/12/2017   Cough 03/28/2017   Proteinuria 11/21/2016   Diarrhea 11/21/2016   Chronic kidney disease, stage III (moderate) ([place]) 11/19/2016   Benign paroxysmal positional vertigo 08/27/2016   Pure hypercholesterolemia 07/28/2014   Vascular insufficiency of intestine ([place]) 06/23/2014   Gouty arthropathy 06/11/2014   Obesity 07/24/2013   Osteoarthrosis 07/24/2013   Reflux esophagitis 07/15/2013   Coronary atherosclerosis of native coronary artery 10/13/2012   Essential hypertension, benign 09/12/2011   PSVT (paroxysmal supraventricular tachycardia)  08/19/2009    Orientation RESPIRATION BLADDER Height & Weight     Self  Normal External catheter Weight: 136 lb 3.9 oz (61.8 kg) Height:  5\' 3"  (160 cm)  BEHAVIORAL SYMPTOMS/MOOD NEUROLOGICAL BOWEL NUTRITION STATUS      Incontinent Diet (Carb modified. See d/c summary for updates.)  AMBULATORY STATUS COMMUNICATION OF NEEDS Skin   Extensive Assist Verbally Bruising                       Personal Care Assistance Level of Assistance  Bathing, Feeding, Dressing Bathing Assistance: Maximum assistance Feeding assistance: Limited assistance Dressing Assistance: Maximum assistance     Functional Limitations Info  Sight, Hearing, Speech Sight Info: Adequate Hearing Info: Impaired Speech Info: Adequate    SPECIAL CARE FACTORS FREQUENCY  PT (By licensed PT)     PT Frequency: 5x weekly              Contractures      Additional Factors Info  Code Status, Allergies Code Status Info: DNR Allergies Info: Toprol Xl (metoprolol Succinate)           Current Medications (08/20/2022):  This is the current hospital active medication list Current Facility-Administered Medications  Medication Dose Route Frequency Provider Last Rate Last Admin   0.9 %  sodium chloride infusion   Intravenous Continuous Zierle-Ghosh, Asia B, DO   Stopped at 08/19/22 1311   acetaminophen (TYLENOL) tablet 650 mg  650 mg Oral Q6H PRN Zierle-Ghosh, Asia B, DO   650 mg at 08/19/22 0849   Or   acetaminophen (TYLENOL) suppository 650 mg  650 mg Rectal Q6H  PRN Zierle-Ghosh, Asia B, DO       atenolol (TENORMIN) tablet 50 mg  50 mg Oral BID Zierle-Ghosh, Asia B, DO   50 mg at 08/20/22 1003   heparin injection 5,000 Units  5,000 Units Subcutaneous Q8H Zierle-Ghosh, Asia B, DO   5,000 Units at 08/20/22 0514   insulin aspart (novoLOG) injection 0-9 Units  0-9 Units Subcutaneous TID WC Johnson, Clanford L, MD   2 Units at 08/20/22 0819   morphine (PF) 2 MG/ML injection 0.5-1 mg  0.5-1 mg Intravenous Q4H PRN  Johnson, Clanford L, MD       ondansetron (ZOFRAN) tablet 4 mg  4 mg Oral Q6H PRN Zierle-Ghosh, Asia B, DO       Or   ondansetron (ZOFRAN) injection 4 mg  4 mg Intravenous Q6H PRN Zierle-Ghosh, Asia B, DO       oxyCODONE (Oxy IR/ROXICODONE) immediate release tablet 5 mg  5 mg Oral Q4H PRN Zierle-Ghosh, Asia B, DO       piperacillin-tazobactam (ZOSYN) IVPB 3.375 g  3.375 g Intravenous Q8H Zierle-Ghosh, Asia B, DO 12.5 mL/hr at 08/20/22 0822 3.375 g at 08/20/22 1950   polyethylene glycol (MIRALAX / GLYCOLAX) packet 17 g  17 g Oral Daily Aviva Signs, MD       potassium chloride 10 mEq in 100 mL IVPB  10 mEq Intravenous Q1 Hr x 3 Johnson, Clanford L, MD 100 mL/hr at 08/20/22 1000 10 mEq at 08/20/22 1000   simvastatin (ZOCOR) tablet 10 mg  10 mg Oral QHS Zierle-Ghosh, Asia B, DO   10 mg at 08/19/22 2129     Discharge Medications: Please see discharge summary for a list of discharge medications.  Relevant Imaging Results:  Relevant Lab Results:   Additional Information SSN: [id number].  [name], LCSW

## 2022-08-20 NOTE — Assessment & Plan Note (Signed)
--   continue skin care protocol  ?

## 2022-08-20 NOTE — TOC Initial Note (Signed)
Transition of Care [facility]) - Initial/Assessment Note    Patient Details  Name: [name] MRN: [id number] Date of Birth: [birth year]  [facility] CM/SW Contact:    [name], [place] Phone Number: 08/20/2022, 11:37 AM  Clinical Narrative: Pt admitted due to acute metabolic encephalopathy. Assessment completed with pt's daughter, [name] as pt oriented to self only per chart. [name] states that she and her siblings have been alternating staying with pt at pt's home. [name] indicates she is HCPOA. Discussed PT recommendation for SNF and [name] is agreeable, requesting [facility], [facility], or [facility]. TOC will follow up with bed offers when available.                   Expected Discharge Plan: Skilled Nursing Facility Barriers to Discharge: Continued Medical Work up   Patient Goals and CMS Choice Patient states their goals for this hospitalization and ongoing recovery are:: rehab   Choice offered to / list presented to : Adult Children  Expected Discharge Plan and Services Expected Discharge Plan: [place] In-house Referral: Clinical Social Work   Post Acute Care Choice: [place] Living arrangements for the past 2 months: [place]                                      Prior Living Arrangements/Services Living arrangements for the past 2 months: Single Family Home Lives with:: Adult Children Patient language and need for interpreter reviewed:: Yes        Need for Family Participation in Patient Care: Yes (Comment) Care giver support system in place?: Yes (comment)   Criminal Activity/Legal Involvement Pertinent to Current Situation/Hospitalization: No - Comment as needed  Activities of Daily Living Home Assistive Devices/Equipment: Dentures (specify type), Hearing aid, Shower chair without back, Walker (specify type), Bedside commode/3-in-1 ADL Screening (condition at time of admission) Patient's cognitive  ability adequate to safely complete daily activities?: No Is the patient deaf or have difficulty hearing?: Yes Does the patient have difficulty seeing, even when wearing glasses/contacts?: No Does the patient have difficulty concentrating, remembering, or making decisions?: Yes Patient able to express need for assistance with ADLs?: Yes Does the patient have difficulty dressing or bathing?: Yes Independently performs ADLs?: No Communication: Independent Dressing (OT): Needs assistance Is this a change from baseline?: Pre-admission baseline Grooming: Needs assistance Is this a change from baseline?: Pre-admission baseline Feeding: Needs assistance Is this a change from baseline?: Pre-admission baseline Bathing: Needs assistance Is this a change from baseline?: Pre-admission baseline Toileting: Needs assistance Is this a change from baseline?: Pre-admission baseline In/Out Bed: Needs assistance Is this a change from baseline?: Pre-admission baseline Walks in Home: Needs assistance Is this a change from baseline?: Pre-admission baseline Does the patient have difficulty walking or climbing stairs?: Yes Weakness of Legs: Both Weakness of Arms/Hands: Both  Permission Sought/Granted                  Emotional Assessment   Attitude/Demeanor/Rapport: Unable to Assess Affect (typically observed): Unable to Assess Orientation: : Oriented to Self Alcohol / Substance Use: Not Applicable Psych Involvement: No (comment)  Admission diagnosis:  Hyponatremia [E87.1] Weakness [R53.1] Diverticulitis of colon [G28.36] Acute metabolic encephalopathy [O29.47] Patient Active Problem List   Diagnosis Date Noted   Diverticulitis of colon    Pressure injury of skin 08/18/2022   Atrial fibrillation ([place])    Dyslipidemia  Type 2 diabetes mellitus (HCC)    Hypomagnesemia 05/03/2022   Anemia of chronic disease 05/03/2022   B12 deficiency 05/03/2022   Hyponatremia 05/02/2022   Paroxysmal  atrial fibrillation ([place]) 05/02/2022   UTI (urinary tract infection) 49/67/5916   Acute metabolic encephalopathy 38/46/6599   Acute cystitis without hematuria 12/01/2021   Current use of long term anticoagulation 08/15/2021   Generalized weakness 08/15/2021   Hyperpotassemia 07/25/2017   Body mass index 29.0-29.9, adult 07/12/2017   Low back pain 07/12/2017   Cough 03/28/2017   Proteinuria 11/21/2016   Diarrhea 11/21/2016   Chronic kidney disease, stage III (moderate) ([place]) 11/19/2016   Benign paroxysmal positional vertigo 08/27/2016   Pure hypercholesterolemia 07/28/2014   Vascular insufficiency of intestine ([place]) 06/23/2014   Gouty arthropathy 06/11/2014   Obesity 07/24/2013   Osteoarthrosis 07/24/2013   Reflux esophagitis 07/15/2013   Coronary atherosclerosis of native coronary artery 10/13/2012   Essential hypertension, benign 09/12/2011   PSVT (paroxysmal supraventricular tachycardia) 08/19/2009   PCP:  Manon Hilding, MD Pharmacy:   [id number] [place], [place] [address] [place] [address] Phone: [phone] Fax: [phone]  [place], [address] [place] [address] [place] [address] Phone: [phone] Fax: [phone]     Social Determinants of Health (SDOH) Interventions Housing Interventions: Intervention Not Indicated  Readmission Risk Interventions     No data to display

## 2022-08-20 NOTE — Hospital Course (Signed)
88 y.o. female with medical history significant of atrial fibrillation, coronary artery disease, hyperlipidemia, paroxysmal SVT, TIA, type 2 diabetes mellitus is diet controlled, presents the ED with a chief complaint of altered mental status.  Family reports the patient has dementia and she is headed steady decline over the last 2 years.  For the first t year, this decline was kind of gradual, but this last year it seems to be progressing more quickly.  They reported she started having more memory deficits.  She has waxing and waning confusion.  They have noticed that she is more confused and more lethargic when she has a UTI.  Since her last hospitalization her kids have started staying with her 24/7.  Therefore as of Friday she could not even do her own ADLs which is not normal for her.  She had bowel incontinence and urine incontinence on the floor which is not normal for her.  She could not ambulate with her walker as she does at baseline.  She had 1 dark stool that was liquid.  They did notice some either anal or perianal blood when they gave her a bath today.  She does have a history of hemorrhoids.  Patient just was not acting herself so they decided to bring her in.  At baseline she can recognize her children.  She can carry on a conversation about past events but not current events.  And she ambulates with her walker.  Patient has not complained of any fevers, abdominal pain, other pains.  She does have chronic neck pain on the left, but no new pains.   Patient does not smoke does not drink does not use illicit drugs.  Patient was vaccinated for COVID.  Patient is DNR.   History is provided by daughter as patient is not responding to questions, not following commands at this time.

## 2022-08-20 NOTE — Assessment & Plan Note (Addendum)
--   treated with IV antibiotics and will discharge to complete 5 days of oral augmentin -- florastor probiotics ordered x 2 weeks for GI protection

## 2022-08-20 NOTE — Plan of Care (Signed)
  Problem: Acute Rehab PT Goals(only PT should resolve) Goal: Pt Will Go Supine/Side To Sit Outcome: Progressing Flowsheets (Taken 08/20/2022 1416) Pt will go Supine/Side to Sit:  with min guard assist  with minimal assist Goal: Patient Will Transfer Sit To/From Stand Outcome: Progressing Flowsheets (Taken 08/20/2022 1416) Patient will transfer sit to/from stand: with min guard assist Goal: Pt Will Transfer Bed To Chair/Chair To Bed Outcome: Progressing Flowsheets (Taken 08/20/2022 1416) Pt will Transfer Bed to Chair/Chair to Bed: min guard assist Goal: Pt Will Ambulate Outcome: Progressing Flowsheets (Taken 08/20/2022 1416) Pt will Ambulate:  50 feet  with min guard assist  with supervision  with rolling walker   2:16 PM, 08/20/22 Lonell Grandchild, MPT Physical Therapist with [facility] [phone] office [phone] mobile phone

## 2022-08-20 NOTE — Progress Notes (Signed)
PROGRESS NOTE   [name]  XQJ:[id number] DOB: Oct 07, 1927 DOA: 08/18/2022 PCP: Manon Hilding, MD   Chief Complaint  Patient presents with   Weakness   Level of care: Med-Surg  Brief Admission History:  88 y.o. female with medical history significant of atrial fibrillation, coronary artery disease, hyperlipidemia, paroxysmal SVT, TIA, type 2 diabetes mellitus is diet controlled, presents the ED with a chief complaint of altered mental status.  Family reports the patient has dementia and she is headed steady decline over the last 2 years.  For the first t year, this decline was kind of gradual, but this last year it seems to be progressing more quickly.  They reported she started having more memory deficits.  She has waxing and waning confusion.  They have noticed that she is more confused and more lethargic when she has a UTI.  Since her last hospitalization her kids have started staying with her 24/7.  Therefore as of Friday she could not even do her own ADLs which is not normal for her.  She had bowel incontinence and urine incontinence on the floor which is not normal for her.  She could not ambulate with her walker as she does at baseline.  She had 1 dark stool that was liquid.  They did notice some either anal or perianal blood when they gave her a bath today.  She does have a history of hemorrhoids.  Patient just was not acting herself so they decided to bring her in.  At baseline she can recognize her children.  She can carry on a conversation about past events but not current events.  And she ambulates with her walker.  Patient has not complained of any fevers, abdominal pain, other pains.  She does have chronic neck pain on the left, but no new pains.   Patient does not smoke does not drink does not use illicit drugs.  Patient was vaccinated for COVID.  Patient is DNR.   History is provided by daughter as patient is not responding to questions, not following commands at this time.    Assessment and Plan: * Acute metabolic encephalopathy - Multifactorial - Hyponatremia 121, appears highest in the recent future was 130, now up to 123 - Hypomagnesemia being repleted  - UTI and diverticulitis contributing - Anticipate improvement with treatment of these underlying problems  Diverticulitis of colon -- continue IV antibiotics  Pressure injury of skin -- continue skin care protocol   Acute cystitis without hematuria - Patient has recurrent UTIs - On Zosyn for diverticulitis which will also cover UTI - Urine culture pending, follow up C&S - Likely the etiology acute metabolic encephalopathy along with diverticulitis  Dyslipidemia - Continue statin  Atrial fibrillation (HCC) - Continue beta-blocker - Not on anticoagulation at home  Hypomagnesemia - 2 g magnesium replaced in ED - Trend in the a.m.  Hyponatremia - Likely due to poor p.o. intake - Encourage p.o. intake when patient is more alert - Sodium at 121 from 130 - Continue IV hydration - improving slowly up to 123 - Continue to monitor  Essential hypertension, benign - Continue atenolol   DVT prophylaxis: [place] heparin  Code Status: DNR  Family Communication:  Disposition: Status is: Inpatient Remains inpatient appropriate because: IV antibiotics    Consultants:  Surgery  Procedures:   Antimicrobials:  Zosyn IV   Subjective: PT denies any specific abdominal pain complaints.  Objective: Vitals:   08/19/22 1444 08/19/22 2100 08/20/22 0528 08/20/22 1458  BP:  110/69 (!) 154/94 (!) 166/106 (!) 180/100  Pulse: 90 94 80 88  Resp: 19 18 20 18   Temp: 98.6 F (37 C) 98.7 F (37.1 C) 97.8 F (36.6 C) (!) 97.5 F (36.4 C)  TempSrc: Oral   Oral  SpO2: 97% 98% 97% 97%  Weight:      Height:        Intake/Output Summary (Last 24 hours) at 08/20/2022 1737 Last data filed at 08/20/2022 1700 Gross per 24 hour  Intake 3058.36 ml  Output 2850 ml  Net 208.36 ml   Filed Weights   08/19/22 0147   Weight: 61.8 kg   Examination:  General exam: elderly female, awake, more alert, Appears calm and comfortable  Respiratory system: Clear to auscultation. Respiratory effort normal. Cardiovascular system: normal S1 & S2 heard. No JVD, murmurs, rubs, gallops or clicks. No pedal edema. Gastrointestinal system: Abdomen is nondistended, soft and nontender. No organomegaly or masses felt. Normal bowel sounds heard. Central nervous system: Alert and oriented. No focal neurological deficits. Extremities: Symmetric 5 x 5 power. Skin: No rashes, lesions or ulcers. Psychiatry: Judgement and insight appear diminished. Mood & affect appropriate.   Data Reviewed: I have personally reviewed following labs and imaging studies  CBC: Recent Labs  Lab 08/18/22 1825 08/19/22 0438  WBC 5.1 5.4  NEUTROABS 3.9 4.4  HGB 12.7 11.9*  HCT 37.4 35.0*  MCV 88.8 87.5  PLT 268 638    Basic Metabolic Panel: Recent Labs  Lab 08/18/22 1825 08/19/22 0437 08/19/22 0438 08/19/22 1101 08/20/22 0646  NA 121* 120*  --  121* 123*  K 4.2 3.8  --  3.8 3.3*  CL 84* 88*  --  90* 92*  CO2 23 21*  --  20* 23  GLUCOSE 121* 142*  --  223* 151*  BUN 15 13  --  15 12  CREATININE 0.54 0.78  --  0.68 0.50  CALCIUM 9.2 8.6*  --  8.6* 8.3*  MG 1.3*  --  1.9  --   --     CBG: Recent Labs  Lab 08/19/22 1649 08/19/22 2102 08/20/22 0745 08/20/22 1110 08/20/22 1547  GLUCAP 187* 140* 160* 153* 142*    No results found for this or any previous visit (from the past 240 hour(s)).   Radiology Studies: CT ABDOMEN PELVIS W CONTRAST  Result Date: 08/18/2022 CLINICAL DATA:  Generalized weakness and abdominal pain EXAM: CT ABDOMEN AND PELVIS WITH CONTRAST TECHNIQUE: Multidetector CT imaging of the abdomen and pelvis was performed using the standard protocol following bolus administration of intravenous contrast. RADIATION DOSE REDUCTION: This exam was performed according to the departmental dose-optimization program  which includes automated exposure control, adjustment of the mA and/or kV according to patient size and/or use of iterative reconstruction technique. CONTRAST:  121mL OMNIPAQUE IOHEXOL 300 MG/ML  SOLN COMPARISON:  CT abdomen and pelvis 05/24/2014 FINDINGS: Lower chest: Coronary artery and mitral annular calcification. Normal heart size. No pericardial effusion. Atelectasis or infiltrates in the left lower lobe. Hepatobiliary: Stable hemangiomas in the liver compared with 05/24/2014. No suspicious focal lesion. Cholecystectomy. Dilation of the common bile duct is unchanged and likely due to reservoir effect. Pancreas: Unremarkable. No pancreatic ductal dilatation or surrounding inflammatory changes. Spleen: Normal in size without focal abnormality. Adrenals/Urinary Tract: Unchanged adrenal nodules bilaterally likely representing adenomas. No follow-up required. Low-attenuation lesions in the kidneys are statistically likely to represent cysts. No follow-up is required. Unremarkable bladder. Stomach/Bowel: Normal caliber large and small bowel. Colonic diverticulosis. Question mild  wall thickening about the sigmoid colon. Small amount of adjacent fluid. Normal appendix. Small hiatal hernia. Stomach is unremarkable. Vascular/Lymphatic: Aortic atherosclerosis. No enlarged abdominal or pelvic lymph nodes. Reproductive: Status post hysterectomy. No adnexal masses. Other: Small amount of free fluid in the pelvis. No free intraperitoneal air. Musculoskeletal: Scoliosis and thoracolumbar spondylosis. No acute abnormality. IMPRESSION: Possible mild sigmoid diverticulitis. Atelectasis or infiltrates in the left lower lobe. Aortic Atherosclerosis (ICD10-I70.0). Electronically Signed   By: Placido Sou M.D.   On: 08/18/2022 22:05   DG Chest 2 View  Result Date: 08/18/2022 CLINICAL DATA:  Weakness, tachypnea EXAM: CHEST - 2 VIEW COMPARISON:  12/02/2021 FINDINGS: Cardiomegaly. Mild, diffuse bilateral interstitial pulmonary  opacity. Disc degenerative disease of the thoracic spine. IMPRESSION: Cardiomegaly with mild, diffuse bilateral interstitial pulmonary opacity, likely edema. No focal airspace opacity. Electronically Signed   By: Delanna Ahmadi M.D.   On: 08/18/2022 18:44    Scheduled Meds:  atenolol  50 mg Oral BID   heparin  5,000 Units Subcutaneous Q8H   insulin aspart  0-9 Units Subcutaneous TID WC   polyethylene glycol  17 g Oral Daily   simvastatin  10 mg Oral QHS   Continuous Infusions:  sodium chloride 100 mL/hr at 08/20/22 1452   piperacillin-tazobactam (ZOSYN)  IV 3.375 g (08/20/22 1628)     LOS: 2 days   Time spent: 3 mins  Dallis Czaja Wynetta Emery, MD How to contact the [facility] Attending or Consulting provider [place] or covering provider during after hours [place], for this patient?  Check the care team in [facility] and look for a) attending/consulting TRH provider listed and b) the [facility] team listed Log into [email or web address] and use [place]'s universal password to access. If you do not have the password, please contact the hospital operator. Locate the [facility] provider you are looking for under [facility] and page to a number that you can be directly reached. If you still have difficulty reaching the provider, please page the [facility] (Director on Call) for the Hospitalists listed on amion for assistance.  08/20/2022, 5:37 PM

## 2022-08-20 NOTE — Progress Notes (Signed)
At start of shift, patient was alert and oriented. Currently I have to repeat what I am telling her in our conversation. She tells me she cannot afford the medication I am trying to give her. She can tell me her name, the month, and where she is but not why she is here.

## 2022-08-20 NOTE — Evaluation (Signed)
Physical Therapy Evaluation Patient Details Name: [name] MRN: [id number] DOB: Apr 24, 1927 Today's Date: 08/20/2022  History of Present Illness  [name] is a 88 y.o. female with medical history significant of atrial fibrillation, coronary artery disease, hyperlipidemia, paroxysmal SVT, TIA, type 2 diabetes mellitus is diet controlled, presents the ED with a chief complaint of altered mental status.  Family reports the patient has dementia and she is headed steady decline over the last 2 years.  For the first t year, this decline was kind of gradual, but this last year it seems to be progressing more quickly.  They reported she started having more memory deficits.  She has waxing and waning confusion.  They have noticed that she is more confused and more lethargic when she has a UTI.  Since her last hospitalization her kids have started staying with her 24/7.  Therefore as of Friday she could not even do her own ADLs which is not normal for her.  She had bowel incontinence and urine incontinence on the floor which is not normal for her.  She could not ambulate with her walker as she does at baseline.  She had 1 dark stool that was liquid.  They did notice some either anal or perianal blood when they gave her a bath today.  She does have a history of hemorrhoids.  Patient just was not acting herself so they decided to bring her in.  At baseline she can recognize her children.  She can carry on a conversation about past events but not current events.  And she ambulates with her walker.  Patient has not complained of any fevers, abdominal pain, other pains.  She does have chronic neck pain on the left, but no new pains.   Clinical Impression  Patient demonstrates labored movement for sitting up at bedside and unable to maintain sitting balance when attempting to put on socks.  Patient required repeated verbal/tactile cueing for proper hand placement during sit to stands, demonstrates slow labored  cadence and very unsteady when making turns with RW.  Patient tolerated sitting up in chair after therapy with her daughter present in room.  Patient will benefit from continued skilled physical therapy in hospital and recommended venue below to increase strength, balance, endurance for safe ADLs and gait.       Recommendations for follow up therapy are one component of a multi-disciplinary discharge planning process, led by the attending physician.  Recommendations may be updated based on patient status, additional functional criteria and insurance authorization.  Follow Up Recommendations Skilled nursing-short term rehab (<3 hours/day)      Assistance Recommended at Discharge Intermittent Supervision/Assistance  Patient can return home with the following  Assistance with cooking/housework;Help with stairs or ramp for entrance;A little help with walking and/or transfers;A lot of help with bathing/dressing/bathroom    Equipment Recommendations None recommended by PT  Recommendations for Other Services       Functional Status Assessment Patient has had a recent decline in their functional status and demonstrates the ability to make significant improvements in function in a reasonable and predictable amount of time.     Precautions / Restrictions Precautions Precautions: Fall Restrictions Weight Bearing Restrictions: No      Mobility  Bed Mobility Overal bed mobility: Needs Assistance Bed Mobility: Supine to Sit     Supine to sit: Min assist, Mod assist     General bed mobility comments: increased time, labored movement    Transfers Overall transfer level: Needs  assistance Equipment used: Rolling walker (2 wheels) Transfers: Sit to/from Stand, Bed to chair/wheelchair/BSC Sit to Stand: Min assist   Step pivot transfers: Min assist       General transfer comment: increased time, labored movement    Ambulation/Gait Ambulation/Gait assistance: Min assist Gait  Distance (Feet): 16 Feet Assistive device: Rolling walker (2 wheels) Gait Pattern/deviations: Decreased step length - right, Decreased step length - left, Decreased stride length Gait velocity: decreased     General Gait Details: slow labored cadence requiring increased time and repeated verbal cueing for safety when making turns, limited mostly due to fatigue and generalized weakness  Stairs            Wheelchair Mobility    Modified Rankin (Stroke Patients Only)       Balance Overall balance assessment: Needs assistance Sitting-balance support: Feet supported, No upper extremity supported Sitting balance-Leahy Scale: Fair Sitting balance - Comments: seated at EOB, posterior leaning when attempting to put on socks Postural control: Posterior lean Standing balance support: During functional activity, Reliant on assistive device for balance, Bilateral upper extremity supported Standing balance-Leahy Scale: Fair Standing balance comment: using RW                             Pertinent Vitals/Pain Pain Assessment Pain Assessment: No/denies pain    Home Living Family/patient expects to be discharged to:: Private residence Living Arrangements: Children Available Help at Discharge: Family;Available 24 hours/day Type of Home: House Home Access: Ramped entrance       Home Layout: One level Home Equipment: Conservation officer, nature (2 wheels);Cane - single point;Transport chair      Prior Function Prior Level of Function : Needs assist       Physical Assist : Mobility (physical);ADLs (physical) Mobility (physical): Bed mobility;Transfers;Gait;Stairs   Mobility Comments: household ambulator using RW ADLs Comments: assisted by family     Hand Dominance        Extremity/Trunk Assessment   Upper Extremity Assessment Upper Extremity Assessment: Generalized weakness    Lower Extremity Assessment Lower Extremity Assessment: Generalized weakness    Cervical /  Trunk Assessment Cervical / Trunk Assessment: Kyphotic  Communication   Communication: HOH  Cognition Arousal/Alertness: Awake/alert Behavior During Therapy: WFL for tasks assessed/performed Overall Cognitive Status: History of cognitive impairments - at baseline                                          General Comments      Exercises     Assessment/Plan    PT Assessment Patient needs continued PT services  PT Problem List Decreased strength;Decreased activity tolerance;Decreased balance;Decreased mobility       PT Treatment Interventions DME instruction;Gait training;Stair training;Functional mobility training;Therapeutic activities;Therapeutic exercise;Balance training;Patient/family education    PT Goals (Current goals can be found in the Care Plan section)  Acute Rehab PT Goals Patient Stated Goal: return home with family to assist PT Goal Formulation: With patient/family Time For Goal Achievement: 09/03/22 Potential to Achieve Goals: Good    Frequency Min 3X/week     Co-evaluation               AM-PAC PT "6 Clicks" Mobility  Outcome Measure Help needed turning from your back to your side while in a flat bed without using bedrails?: A Little Help needed moving from lying on your  back to sitting on the side of a flat bed without using bedrails?: A Little Help needed moving to and from a bed to a chair (including a wheelchair)?: A Little Help needed standing up from a chair using your arms (e.g., wheelchair or bedside chair)?: A Little Help needed to walk in hospital room?: A Lot Help needed climbing 3-5 steps with a railing? : A Lot 6 Click Score: 16    End of Session   Activity Tolerance: Patient tolerated treatment well;Patient limited by fatigue Patient left: in chair;with call bell/phone within reach;with family/visitor present;with chair alarm set Nurse Communication: Mobility status PT Visit Diagnosis: Unsteadiness on feet  (R26.81);Other abnormalities of gait and mobility (R26.89);Muscle weakness (generalized) (M62.81)    Time: 7482-7078 PT Time Calculation (min) (ACUTE ONLY): 31 min   Charges:   PT Evaluation $PT Eval Moderate Complexity: 1 Mod PT Treatments $Therapeutic Activity: 23-37 mins        2:14 PM, 08/20/22 Lonell Grandchild, MPT Physical Therapist with [facility] [phone] office [phone] mobile phone

## 2022-08-21 DIAGNOSIS — F03C0 Unspecified dementia, severe, without behavioral disturbance, psychotic disturbance, mood disturbance, and anxiety: Secondary | ICD-10-CM | POA: Diagnosis not present

## 2022-08-21 DIAGNOSIS — E119 Type 2 diabetes mellitus without complications: Secondary | ICD-10-CM | POA: Diagnosis not present

## 2022-08-21 DIAGNOSIS — M5134 Other intervertebral disc degeneration, thoracic region: Secondary | ICD-10-CM | POA: Diagnosis not present

## 2022-08-21 DIAGNOSIS — E785 Hyperlipidemia, unspecified: Secondary | ICD-10-CM | POA: Diagnosis not present

## 2022-08-21 DIAGNOSIS — G9341 Metabolic encephalopathy: Secondary | ICD-10-CM | POA: Diagnosis not present

## 2022-08-21 DIAGNOSIS — R1013 Epigastric pain: Secondary | ICD-10-CM | POA: Diagnosis not present

## 2022-08-21 DIAGNOSIS — R5381 Other malaise: Secondary | ICD-10-CM | POA: Diagnosis not present

## 2022-08-21 DIAGNOSIS — K5900 Constipation, unspecified: Secondary | ICD-10-CM | POA: Diagnosis not present

## 2022-08-21 DIAGNOSIS — E1122 Type 2 diabetes mellitus with diabetic chronic kidney disease: Secondary | ICD-10-CM | POA: Diagnosis not present

## 2022-08-21 DIAGNOSIS — E441 Mild protein-calorie malnutrition: Secondary | ICD-10-CM | POA: Diagnosis not present

## 2022-08-21 DIAGNOSIS — I10 Essential (primary) hypertension: Secondary | ICD-10-CM | POA: Diagnosis not present

## 2022-08-21 DIAGNOSIS — R279 Unspecified lack of coordination: Secondary | ICD-10-CM | POA: Diagnosis not present

## 2022-08-21 DIAGNOSIS — K5732 Diverticulitis of large intestine without perforation or abscess without bleeding: Secondary | ICD-10-CM | POA: Diagnosis not present

## 2022-08-21 DIAGNOSIS — E871 Hypo-osmolality and hyponatremia: Secondary | ICD-10-CM | POA: Diagnosis not present

## 2022-08-21 DIAGNOSIS — R2689 Other abnormalities of gait and mobility: Secondary | ICD-10-CM | POA: Diagnosis not present

## 2022-08-21 DIAGNOSIS — I4891 Unspecified atrial fibrillation: Secondary | ICD-10-CM | POA: Diagnosis not present

## 2022-08-21 DIAGNOSIS — M6281 Muscle weakness (generalized): Secondary | ICD-10-CM | POA: Diagnosis not present

## 2022-08-21 DIAGNOSIS — N3000 Acute cystitis without hematuria: Secondary | ICD-10-CM | POA: Diagnosis not present

## 2022-08-21 DIAGNOSIS — R109 Unspecified abdominal pain: Secondary | ICD-10-CM | POA: Diagnosis not present

## 2022-08-21 DIAGNOSIS — F09 Unspecified mental disorder due to known physiological condition: Secondary | ICD-10-CM | POA: Diagnosis not present

## 2022-08-21 DIAGNOSIS — R4189 Other symptoms and signs involving cognitive functions and awareness: Secondary | ICD-10-CM | POA: Diagnosis not present

## 2022-08-21 DIAGNOSIS — K5792 Diverticulitis of intestine, part unspecified, without perforation or abscess without bleeding: Secondary | ICD-10-CM | POA: Diagnosis not present

## 2022-08-21 DIAGNOSIS — Z7901 Long term (current) use of anticoagulants: Secondary | ICD-10-CM | POA: Diagnosis not present

## 2022-08-21 LAB — BASIC METABOLIC PANEL
Anion gap: 9 (ref 5–15)
BUN: 8 mg/dL (ref 8–23)
CO2: 22 mmol/L (ref 22–32)
Calcium: 8.4 mg/dL — ABNORMAL LOW (ref 8.9–10.3)
Chloride: 99 mmol/L (ref 98–111)
Creatinine, Ser: 0.5 mg/dL (ref 0.44–1.00)
GFR, Estimated: >60 mL/min (ref >60)
Glucose, Bld: 132 mg/dL — ABNORMAL HIGH (ref 70–99)
Potassium: 3.3 mmol/L — ABNORMAL LOW (ref 3.5–5.1)
Sodium: 130 mmol/L — ABNORMAL LOW (ref 135–145)

## 2022-08-21 LAB — GLUCOSE, CAPILLARY
Glucose-Capillary: 141 mg/dL — ABNORMAL HIGH (ref 70–99)
Glucose-Capillary: 191 mg/dL — ABNORMAL HIGH (ref 70–99)

## 2022-08-21 LAB — MAGNESIUM: Magnesium: 1.5 mg/dL — ABNORMAL LOW (ref 1.7–2.4)

## 2022-08-21 MED ORDER — SACCHAROMYCES BOULARDII 250 MG PO CAPS: 250.0000 mg | ORAL_CAPSULE | Freq: Two times a day (BID) | ORAL | 0 refills | Status: AC

## 2022-08-21 MED ORDER — AMOXICILLIN-POT CLAVULANATE 500-125 MG PO TABS: 1.0000 | ORAL_TABLET | Freq: Two times a day (BID) | ORAL | 0 refills | Status: AC

## 2022-08-21 MED ORDER — MAGNESIUM SULFATE 4 GM/100ML IV SOLN
4.0000 g | Freq: Once | INTRAVENOUS | Status: AC
  Administered 2022-08-21: 4 g via INTRAVENOUS
  Filled 2022-08-21: qty 100

## 2022-08-21 NOTE — Discharge Instructions (Signed)
  IMPORTANT INFORMATION: PAY CLOSE ATTENTION   PHYSICIAN DISCHARGE INSTRUCTIONS  Follow with Primary care provider  Sasser, Paul W, MD  and other consultants as instructed by your Hospitalist Physician  SEEK MEDICAL CARE OR RETURN TO EMERGENCY ROOM IF SYMPTOMS COME BACK, WORSEN OR NEW PROBLEM DEVELOPS   Please note: You were cared for by a hospitalist during your hospital stay. Every effort will be made to forward records to your primary care provider.  You can request that your primary care provider send for your hospital records if they have not received them.  Once you are discharged, your primary care physician will handle any further medical issues. Please note that NO REFILLS for any discharge medications will be authorized once you are discharged, as it is imperative that you return to your primary care physician (or establish a relationship with a primary care physician if you do not have one) for your post hospital discharge needs so that they can reassess your need for medications and monitor your lab values.  Please get a complete blood count and chemistry panel checked by your Primary MD at your next visit, and again as instructed by your Primary MD.  Get Medicines reviewed and adjusted: Please take all your medications with you for your next visit with your Primary MD  Laboratory/radiological data: Please request your Primary MD to go over all hospital tests and procedure/radiological results at the follow up, please ask your primary care provider to get all Hospital records sent to his/her office.  In some cases, they will be blood work, cultures and biopsy results pending at the time of your discharge. Please request that your primary care provider follow up on these results.  If you are diabetic, please bring your blood sugar readings with you to your follow up appointment with primary care.    Please call and make your follow up appointments as soon as possible.    Also Note  the following: If you experience worsening of your admission symptoms, develop shortness of breath, life threatening emergency, suicidal or homicidal thoughts you must seek medical attention immediately by calling 911 or calling your MD immediately  if symptoms less severe.  You must read complete instructions/literature along with all the possible adverse reactions/side effects for all the Medicines you take and that have been prescribed to you. Take any new Medicines after you have completely understood and accpet all the possible adverse reactions/side effects.   Do not drive when taking Pain medications or sleeping medications (Benzodiazepines)  Do not take more than prescribed Pain, Sleep and Anxiety Medications. It is not advisable to combine anxiety,sleep and pain medications without talking with your primary care practitioner  Special Instructions: If you have smoked or chewed Tobacco  in the last 2 yrs please stop smoking, stop any regular Alcohol  and or any Recreational drug use.  Wear Seat belts while driving.  Do not drive if taking any narcotic, mind altering or controlled substances or recreational drugs or alcohol.        

## 2022-08-21 NOTE — Progress Notes (Signed)
Repot called to [facility], I spoke with Margreta Journey nurse to give report. No further questions at this time. Pts daughter will be taking pt home once medications are done.

## 2022-08-21 NOTE — Care Management Important Message (Signed)
Important Message  Patient Details  Name: [name] MRN: [id number] Date of Birth: 06/22/1927   Medicare Important Message Given:  Yes     Tommy Medal 08/21/2022, 1:39 PM

## 2022-08-21 NOTE — Discharge Summary (Signed)
Physician Discharge Summary  [name] XBL:[id number] DOB: March 14, 1927 DOA: 08/18/2022  PCP: Manon Hilding, MD Cardiology:  Dennard Schaumann date: 08/18/2022 Discharge date: 08/21/2022  Admitted From:  Home  Disposition: SNF   Recommendations for Outpatient Follow-up:  BLEEDING PRECAUTIONS WHILE ON ANTICOAGULATION THERAPY FALL PRECAUTIONS STRONGLY RECOMMENDED Follow up with cardiology as scheduled Follow up with PCP as scheduled  Please monitor BMP/CBC to follow sodium level and hemoglobin in 2 weeks  Please follow up on the following pending results: final urine culture results   Discharge Condition: STABLE   CODE STATUS: DNR DIET: CARB MODIFIED, NO CONCENTRATED SWEETS   Brief Hospitalization Summary: Please see all hospital notes, images, labs for full details of the hospitalization. 88 y.o. female with medical history significant of atrial fibrillation, coronary artery disease, hyperlipidemia, paroxysmal SVT, TIA, type 2 diabetes mellitus is diet controlled, presents the ED with a chief complaint of altered mental status.  Family reports the patient has dementia and she is headed steady decline over the last 2 years.  For the first t year, this decline was kind of gradual, but this last year it seems to be progressing more quickly.  They reported she started having more memory deficits.  She has waxing and waning confusion.  They have noticed that she is more confused and more lethargic when she has a UTI.  Since her last hospitalization her kids have started staying with her 24/7.  Therefore as of Friday she could not even do her own ADLs which is not normal for her.  She had bowel incontinence and urine incontinence on the floor which is not normal for her.  She could not ambulate with her walker as she does at baseline.  She had 1 dark stool that was liquid.  They did notice some either anal or perianal blood when they gave her a bath today.  She does have a history of hemorrhoids.   Patient just was not acting herself so they decided to bring her in.  At baseline she can recognize her children.  She can carry on a conversation about past events but not current events.  And she ambulates with her walker.  Patient has not complained of any fevers, abdominal pain, other pains.  She does have chronic neck pain on the left, but no new pains.   Patient does not smoke does not drink does not use illicit drugs.  Patient was vaccinated for COVID.  Patient is DNR.   History is provided by daughter as patient is not responding to questions, not following commands at this time.   HOSPITAL COURSE BY PROBLEM   Assessment and Plan: * Acute metabolic encephalopathy - Multifactorial but resolved now, pt back to her baseline - Hyponatremia 121, appears highest in the recent future was 130, now up to 123 - Hypomagnesemia being repleted     Diverticulitis of colon -- treated with IV antibiotics and will discharge to complete 5 days of oral augmentin -- florastor probiotics ordered x 2 weeks for GI protection  Pressure injury of skin -- continue skin care protocol   Acute cystitis without hematuria - Patient has recurrent UTIs - Treated with Zosyn for diverticulitis which will also cover UTI - Urine culture pending, follow up C&S -- No growth to date - Likely the etiology acute metabolic encephalopathy along with diverticulitis  Dyslipidemia - Continue statin  Atrial fibrillation (HCC) - Continue beta-blocker - I found out today that during recent hospitalization at [facility] in Aug 2023  patient had been taken off apixaban due to high fall risk and age; however family started her back on it after she was discharged and she has continued to take it at home.  I discussed with daughter again today and she is adamant that she wants mother to continue taking apixaban despite the risk of bleeding and she is very HIGH RISK for bleeding complication especially also being on meloxicam however she  was clear that she wanted patient to absolutely continue apixaban.  Cardiology has her on the 5 mg dose and daughter says that she does not want to stop it unless cardiology tells her to stop it.  She verbalized understanding of the bleeding risks and potential complications.   Hypomagnesemia - 4 g magnesium IV given prior to discharge   Hyponatremia - Likely due to poor p.o. intake - Sodium at 121 from 130 baseline - treated with IV hydration - improved up to 130 with IV fluid hydration - this is back at her baseline  Essential hypertension, benign - Continue atenolol   Discharge Diagnoses:  Principal Problem:   Acute metabolic encephalopathy Active Problems:   Essential hypertension, benign   Hyponatremia   Hypomagnesemia   Atrial fibrillation (HCC)   Dyslipidemia   Acute cystitis without hematuria   Pressure injury of skin   Diverticulitis of colon   Discharge Instructions:  Allergies as of 08/21/2022       Reactions   Toprol Xl [metoprolol Succinate] Other (See Comments)   HAIR LOSS        Medication List     STOP taking these medications    cephALEXin 250 MG capsule Commonly known as: KEFLEX       TAKE these medications    acetaminophen 500 MG tablet Commonly known as: TYLENOL Take 1,000 mg by mouth 2 (two) times daily.   amoxicillin-clavulanate 500-125 MG tablet Commonly known as: Augmentin Take 1 tablet (500 mg total) by mouth 2 (two) times daily for 5 days.   apixaban 5 MG Tabs tablet Commonly known as: ELIQUIS Take 5 mg by mouth 2 (two) times daily.   atenolol 50 MG tablet Commonly known as: TENORMIN TAKE 1 TABLET BY MOUTH TWICE A DAY   meloxicam 7.5 MG tablet Commonly known as: MOBIC Take 7.5 mg by mouth every other day.   metFORMIN 500 MG 24 hr tablet Commonly known as: GLUCOPHAGE-XR Take 2 tabs (1,000mg ) by mouth every morning & 1 tab (500mg ) every evening   saccharomyces boulardii 250 MG capsule Commonly known as:  Florastor Take 1 capsule (250 mg total) by mouth 2 (two) times daily for 14 days.   simvastatin 10 MG tablet Commonly known as: ZOCOR Take 10 mg by mouth at bedtime.        Follow-up Information     Sasser, Silvestre Moment, MD. Schedule an appointment as soon as possible for a visit in 2 week(s).   Specialty: Family Medicine Why: Hospital Follow Up Contact information: [place] [address] [phone]         Satira Sark, MD Follow up on 09/26/2022.   Specialty: Cardiology Why: as scheduled Contact information: [address] [place] [address] [phone]                Allergies  Allergen Reactions   Toprol Xl [Metoprolol Succinate] Other (See Comments)    HAIR LOSS   Allergies as of 08/21/2022       Reactions   Toprol Xl [  metoprolol Succinate] Other (See Comments)   HAIR LOSS        Medication List     STOP taking these medications    cephALEXin 250 MG capsule Commonly known as: KEFLEX       TAKE these medications    acetaminophen 500 MG tablet Commonly known as: TYLENOL Take 1,000 mg by mouth 2 (two) times daily.   amoxicillin-clavulanate 500-125 MG tablet Commonly known as: Augmentin Take 1 tablet (500 mg total) by mouth 2 (two) times daily for 5 days.   apixaban 5 MG Tabs tablet Commonly known as: ELIQUIS Take 5 mg by mouth 2 (two) times daily.   atenolol 50 MG tablet Commonly known as: TENORMIN TAKE 1 TABLET BY MOUTH TWICE A DAY   meloxicam 7.5 MG tablet Commonly known as: MOBIC Take 7.5 mg by mouth every other day.   metFORMIN 500 MG 24 hr tablet Commonly known as: GLUCOPHAGE-XR Take 2 tabs (1,000mg ) by mouth every morning & 1 tab (500mg ) every evening   saccharomyces boulardii 250 MG capsule Commonly known as: Florastor Take 1 capsule (250 mg total) by mouth 2 (two) times daily for 14 days.   simvastatin 10 MG tablet Commonly known as: ZOCOR Take 10 mg by mouth at bedtime.         Procedures/Studies: CT ABDOMEN PELVIS W CONTRAST  Result Date: 08/18/2022 CLINICAL DATA:  Generalized weakness and abdominal pain EXAM: CT ABDOMEN AND PELVIS WITH CONTRAST TECHNIQUE: Multidetector CT imaging of the abdomen and pelvis was performed using the standard protocol following bolus administration of intravenous contrast. RADIATION DOSE REDUCTION: This exam was performed according to the departmental dose-optimization program which includes automated exposure control, adjustment of the mA and/or kV according to patient size and/or use of iterative reconstruction technique. CONTRAST:  154mL OMNIPAQUE IOHEXOL 300 MG/ML  SOLN COMPARISON:  CT abdomen and pelvis 05/24/2014 FINDINGS: Lower chest: Coronary artery and mitral annular calcification. Normal heart size. No pericardial effusion. Atelectasis or infiltrates in the left lower lobe. Hepatobiliary: Stable hemangiomas in the liver compared with 05/24/2014. No suspicious focal lesion. Cholecystectomy. Dilation of the common bile duct is unchanged and likely due to reservoir effect. Pancreas: Unremarkable. No pancreatic ductal dilatation or surrounding inflammatory changes. Spleen: Normal in size without focal abnormality. Adrenals/Urinary Tract: Unchanged adrenal nodules bilaterally likely representing adenomas. No follow-up required. Low-attenuation lesions in the kidneys are statistically likely to represent cysts. No follow-up is required. Unremarkable bladder. Stomach/Bowel: Normal caliber large and small bowel. Colonic diverticulosis. Question mild wall thickening about the sigmoid colon. Small amount of adjacent fluid. Normal appendix. Small hiatal hernia. Stomach is unremarkable. Vascular/Lymphatic: Aortic atherosclerosis. No enlarged abdominal or pelvic lymph nodes. Reproductive: Status post hysterectomy. No adnexal masses. Other: Small amount of free fluid in the pelvis. No free intraperitoneal air. Musculoskeletal: Scoliosis and thoracolumbar  spondylosis. No acute abnormality. IMPRESSION: Possible mild sigmoid diverticulitis. Atelectasis or infiltrates in the left lower lobe. Aortic Atherosclerosis (ICD10-I70.0). Electronically Signed   By: Placido Sou M.D.   On: 08/18/2022 22:05   DG Chest 2 View  Result Date: 08/18/2022 CLINICAL DATA:  Weakness, tachypnea EXAM: CHEST - 2 VIEW COMPARISON:  12/02/2021 FINDINGS: Cardiomegaly. Mild, diffuse bilateral interstitial pulmonary opacity. Disc degenerative disease of the thoracic spine. IMPRESSION: Cardiomegaly with mild, diffuse bilateral interstitial pulmonary opacity, likely edema. No focal airspace opacity. Electronically Signed   By: Delanna Ahmadi M.D.   On: 08/18/2022 18:44     Subjective: Pt more alert, less confusion, no complaints.   Discharge Exam: Vitals:  08/21/22 0528 08/21/22 0542  BP: (!) 157/92 (!) 145/93  Pulse: 79 81  Resp: 16   Temp: 98.2 F (36.8 C)   SpO2: 98%    Vitals:   08/20/22 1954 08/21/22 0255 08/21/22 0528 08/21/22 0542  BP: (!) 155/120 (!) 157/92 (!) 157/92 (!) 145/93  Pulse: 96 91 79 81  Resp: 19  16   Temp: 97.7 F (36.5 C)  98.2 F (36.8 C)   TempSrc: Oral  Oral   SpO2: 98%  98%   Weight:      Height:        General exam: elderly female, awake, more alert, Appears calm and comfortable  Respiratory system: Clear to auscultation. Respiratory effort normal. Cardiovascular system: normal S1 & S2 heard. No JVD, murmurs, rubs, gallops or clicks. No pedal edema. Gastrointestinal system: Abdomen is nondistended, soft and nontender. No organomegaly or masses felt. Normal bowel sounds heard. Central nervous system: Alert and oriented. No focal neurological deficits. Extremities: Symmetric 5 x 5 power. Skin: No rashes, lesions or ulcers. Psychiatry: Judgement and insight appear diminished. Mood & affect appropriate.   The results of significant diagnostics from this hospitalization (including imaging, microbiology, ancillary and laboratory) are  listed below for reference.     Microbiology: No results found for this or any previous visit (from the past 240 hour(s)).   Labs: BNP (last 3 results) No results for input(s): "BNP" in the last 8760 hours. Basic Metabolic Panel: Recent Labs  Lab 08/18/22 1825 08/19/22 0437 08/19/22 0438 08/19/22 1101 08/20/22 0646 08/21/22 0805  NA 121* 120*  --  121* 123* 130*  K 4.2 3.8  --  3.8 3.3* 3.3*  CL 84* 88*  --  90* 92* 99  CO2 23 21*  --  20* 23 22  GLUCOSE 121* 142*  --  223* 151* 132*  BUN 15 13  --  15 12 8   CREATININE 0.54 0.78  --  0.68 0.50 0.50  CALCIUM 9.2 8.6*  --  8.6* 8.3* 8.4*  MG 1.3*  --  1.9  --   --  1.5*   Liver Function Tests: Recent Labs  Lab 08/18/22 1825  AST 27  ALT 14  ALKPHOS 46  BILITOT 1.2  PROT 8.0  ALBUMIN 4.2   No results for input(s): "LIPASE", "AMYLASE" in the last 168 hours. No results for input(s): "AMMONIA" in the last 168 hours. CBC: Recent Labs  Lab 08/18/22 1825 08/19/22 0438  WBC 5.1 5.4  NEUTROABS 3.9 4.4  HGB 12.7 11.9*  HCT 37.4 35.0*  MCV 88.8 87.5  PLT 268 254   Cardiac Enzymes: No results for input(s): "CKTOTAL", "CKMB", "CKMBINDEX", "TROPONINI" in the last 168 hours. BNP: Invalid input(s): "POCBNP" CBG: Recent Labs  Lab 08/20/22 1110 08/20/22 1547 08/20/22 1956 08/21/22 0716 08/21/22 1100  GLUCAP 153* 142* 153* 141* 191*   D-Dimer No results for input(s): "DDIMER" in the last 72 hours. Hgb A1c No results for input(s): "HGBA1C" in the last 72 hours. Lipid Profile No results for input(s): "CHOL", "HDL", "LDLCALC", "TRIG", "CHOLHDL", "LDLDIRECT" in the last 72 hours. Thyroid function studies No results for input(s): "TSH", "T4TOTAL", "T3FREE", "THYROIDAB" in the last 72 hours.  Invalid input(s): "FREET3" Anemia work up No results for input(s): "VITAMINB12", "FOLATE", "FERRITIN", "TIBC", "IRON", "RETICCTPCT" in the last 72 hours. Urinalysis    Component Value Date/Time   COLORURINE YELLOW  08/18/2022 [place] 08/18/2022 1758   LABSPEC 1.010 08/18/2022 1758   PHURINE 6.0 08/18/2022 1758  GLUCOSEU NEGATIVE 08/18/2022 1758   HGBUR SMALL (A) 08/18/2022 1758   BILIRUBINUR NEGATIVE 08/18/2022 1758   KETONESUR 20 (A) 08/18/2022 1758   PROTEINUR 30 (A) 08/18/2022 1758   NITRITE NEGATIVE 08/18/2022 1758   LEUKOCYTESUR SMALL (A) 08/18/2022 1758   Sepsis Labs Recent Labs  Lab 08/18/22 1825 08/19/22 0438  WBC 5.1 5.4   Microbiology No results found for this or any previous visit (from the past 240 hour(s)).  Time coordinating discharge: 40 mins  SIGNED:  Irwin Brakeman, MD  [facility] 08/21/2022, 12:32 PM How to contact the [facility] Attending or Consulting provider [place] or covering provider during after hours [place], for this patient?  Check the care team in [facility] and look for a) attending/consulting [facility] provider listed and b) the [facility] team listed Log into [email or web address] and use [place]'s universal password to access. If you do not have the password, please contact the hospital operator. Locate the [facility] provider you are looking for under [facility] and page to a number that you can be directly reached. If you still have difficulty reaching the provider, please page the [facility] (Director on Call) for the Hospitalists listed on amion for assistance.

## 2022-08-22 LAB — URINE CULTURE: Culture: NO GROWTH

## 2022-08-23 DIAGNOSIS — E119 Type 2 diabetes mellitus without complications: Secondary | ICD-10-CM | POA: Diagnosis not present

## 2022-08-23 DIAGNOSIS — I4891 Unspecified atrial fibrillation: Secondary | ICD-10-CM | POA: Diagnosis not present

## 2022-08-23 DIAGNOSIS — E785 Hyperlipidemia, unspecified: Secondary | ICD-10-CM | POA: Diagnosis not present

## 2022-08-23 DIAGNOSIS — I10 Essential (primary) hypertension: Secondary | ICD-10-CM | POA: Diagnosis not present

## 2022-08-23 DIAGNOSIS — F03C0 Unspecified dementia, severe, without behavioral disturbance, psychotic disturbance, mood disturbance, and anxiety: Secondary | ICD-10-CM | POA: Diagnosis not present

## 2022-08-27 DIAGNOSIS — F03C0 Unspecified dementia, severe, without behavioral disturbance, psychotic disturbance, mood disturbance, and anxiety: Secondary | ICD-10-CM | POA: Diagnosis not present

## 2022-08-27 DIAGNOSIS — E119 Type 2 diabetes mellitus without complications: Secondary | ICD-10-CM | POA: Diagnosis not present

## 2022-08-27 DIAGNOSIS — I4891 Unspecified atrial fibrillation: Secondary | ICD-10-CM | POA: Diagnosis not present

## 2022-08-27 DIAGNOSIS — E785 Hyperlipidemia, unspecified: Secondary | ICD-10-CM | POA: Diagnosis not present

## 2022-08-27 DIAGNOSIS — I10 Essential (primary) hypertension: Secondary | ICD-10-CM | POA: Diagnosis not present

## 2022-08-28 DIAGNOSIS — K5792 Diverticulitis of intestine, part unspecified, without perforation or abscess without bleeding: Secondary | ICD-10-CM | POA: Diagnosis not present

## 2022-08-28 DIAGNOSIS — N3000 Acute cystitis without hematuria: Secondary | ICD-10-CM | POA: Diagnosis not present

## 2022-08-28 DIAGNOSIS — G9341 Metabolic encephalopathy: Secondary | ICD-10-CM | POA: Diagnosis not present

## 2022-08-28 DIAGNOSIS — R5381 Other malaise: Secondary | ICD-10-CM | POA: Diagnosis not present

## 2022-08-29 DIAGNOSIS — K5900 Constipation, unspecified: Secondary | ICD-10-CM | POA: Diagnosis not present

## 2022-08-29 DIAGNOSIS — R1013 Epigastric pain: Secondary | ICD-10-CM | POA: Diagnosis not present

## 2022-09-03 DIAGNOSIS — K5900 Constipation, unspecified: Secondary | ICD-10-CM | POA: Diagnosis not present

## 2022-09-03 DIAGNOSIS — R1013 Epigastric pain: Secondary | ICD-10-CM | POA: Diagnosis not present

## 2022-09-07 DIAGNOSIS — I10 Essential (primary) hypertension: Secondary | ICD-10-CM | POA: Diagnosis not present

## 2022-09-07 DIAGNOSIS — E119 Type 2 diabetes mellitus without complications: Secondary | ICD-10-CM | POA: Diagnosis not present

## 2022-09-07 DIAGNOSIS — E785 Hyperlipidemia, unspecified: Secondary | ICD-10-CM | POA: Diagnosis not present

## 2022-09-07 DIAGNOSIS — F03C0 Unspecified dementia, severe, without behavioral disturbance, psychotic disturbance, mood disturbance, and anxiety: Secondary | ICD-10-CM | POA: Diagnosis not present

## 2022-09-07 DIAGNOSIS — R5381 Other malaise: Secondary | ICD-10-CM | POA: Diagnosis not present

## 2022-09-17 DIAGNOSIS — Z6829 Body mass index (BMI) 29.0-29.9, adult: Secondary | ICD-10-CM | POA: Diagnosis not present

## 2022-09-17 DIAGNOSIS — E871 Hypo-osmolality and hyponatremia: Secondary | ICD-10-CM | POA: Diagnosis not present

## 2022-09-17 DIAGNOSIS — R410 Disorientation, unspecified: Secondary | ICD-10-CM | POA: Diagnosis not present

## 2022-09-17 DIAGNOSIS — Z23 Encounter for immunization: Secondary | ICD-10-CM | POA: Diagnosis not present

## 2022-09-17 DIAGNOSIS — Z9189 Other specified personal risk factors, not elsewhere classified: Secondary | ICD-10-CM | POA: Diagnosis not present

## 2022-09-17 DIAGNOSIS — Z9181 History of falling: Secondary | ICD-10-CM | POA: Diagnosis not present

## 2022-09-18 DIAGNOSIS — I4891 Unspecified atrial fibrillation: Secondary | ICD-10-CM | POA: Diagnosis not present

## 2022-09-18 DIAGNOSIS — E1165 Type 2 diabetes mellitus with hyperglycemia: Secondary | ICD-10-CM | POA: Diagnosis not present

## 2022-09-18 DIAGNOSIS — M1991 Primary osteoarthritis, unspecified site: Secondary | ICD-10-CM | POA: Diagnosis not present

## 2022-09-20 ENCOUNTER — Emergency Department (HOSPITAL_COMMUNITY): Payer: Medicare Other

## 2022-09-20 ENCOUNTER — Encounter (HOSPITAL_COMMUNITY): Payer: Self-pay | Admitting: Emergency Medicine

## 2022-09-20 ENCOUNTER — Inpatient Hospital Stay (HOSPITAL_COMMUNITY)
Admission: EM | Admit: 2022-09-20 | Discharge: 2022-09-24 | DRG: 177 | Disposition: A | Discharge status: Home Health | Payer: Medicare Other | Attending: Family Medicine | Admitting: Family Medicine

## 2022-09-20 DIAGNOSIS — E119 Type 2 diabetes mellitus without complications: Secondary | ICD-10-CM | POA: Diagnosis not present

## 2022-09-20 DIAGNOSIS — I130 Hypertensive heart and chronic kidney disease with heart failure and stage 1 through stage 4 chronic kidney disease, or unspecified chronic kidney disease: Secondary | ICD-10-CM | POA: Diagnosis not present

## 2022-09-20 DIAGNOSIS — Z79899 Other long term (current) drug therapy: Secondary | ICD-10-CM | POA: Diagnosis not present

## 2022-09-20 DIAGNOSIS — N183 Chronic kidney disease, stage 3 unspecified: Secondary | ICD-10-CM | POA: Diagnosis not present

## 2022-09-20 DIAGNOSIS — Z9049 Acquired absence of other specified parts of digestive tract: Secondary | ICD-10-CM | POA: Diagnosis not present

## 2022-09-20 DIAGNOSIS — E860 Dehydration: Secondary | ICD-10-CM | POA: Diagnosis not present

## 2022-09-20 DIAGNOSIS — Z8673 Personal history of transient ischemic attack (TIA), and cerebral infarction without residual deficits: Secondary | ICD-10-CM

## 2022-09-20 DIAGNOSIS — I2510 Atherosclerotic heart disease of native coronary artery without angina pectoris: Secondary | ICD-10-CM | POA: Diagnosis present

## 2022-09-20 DIAGNOSIS — Z794 Long term (current) use of insulin: Secondary | ICD-10-CM | POA: Diagnosis not present

## 2022-09-20 DIAGNOSIS — E1122 Type 2 diabetes mellitus with diabetic chronic kidney disease: Secondary | ICD-10-CM | POA: Diagnosis not present

## 2022-09-20 DIAGNOSIS — B961 Klebsiella pneumoniae [K. pneumoniae] as the cause of diseases classified elsewhere: Secondary | ICD-10-CM | POA: Diagnosis not present

## 2022-09-20 DIAGNOSIS — I482 Chronic atrial fibrillation, unspecified: Secondary | ICD-10-CM | POA: Diagnosis present

## 2022-09-20 DIAGNOSIS — I4891 Unspecified atrial fibrillation: Secondary | ICD-10-CM | POA: Diagnosis not present

## 2022-09-20 DIAGNOSIS — I5032 Chronic diastolic (congestive) heart failure: Secondary | ICD-10-CM | POA: Diagnosis present

## 2022-09-20 DIAGNOSIS — E785 Hyperlipidemia, unspecified: Secondary | ICD-10-CM | POA: Diagnosis present

## 2022-09-20 DIAGNOSIS — E871 Hypo-osmolality and hyponatremia: Secondary | ICD-10-CM | POA: Diagnosis present

## 2022-09-20 DIAGNOSIS — I480 Paroxysmal atrial fibrillation: Secondary | ICD-10-CM | POA: Diagnosis present

## 2022-09-20 DIAGNOSIS — G9341 Metabolic encephalopathy: Secondary | ICD-10-CM | POA: Diagnosis not present

## 2022-09-20 DIAGNOSIS — J9601 Acute respiratory failure with hypoxia: Secondary | ICD-10-CM | POA: Diagnosis present

## 2022-09-20 DIAGNOSIS — N3000 Acute cystitis without hematuria: Secondary | ICD-10-CM | POA: Diagnosis present

## 2022-09-20 DIAGNOSIS — Z7901 Long term (current) use of anticoagulants: Secondary | ICD-10-CM | POA: Diagnosis not present

## 2022-09-20 DIAGNOSIS — Z888 Allergy status to other drugs, medicaments and biological substances status: Secondary | ICD-10-CM

## 2022-09-20 DIAGNOSIS — U071 COVID-19: Secondary | ICD-10-CM | POA: Diagnosis not present

## 2022-09-20 DIAGNOSIS — R0689 Other abnormalities of breathing: Secondary | ICD-10-CM | POA: Diagnosis not present

## 2022-09-20 DIAGNOSIS — R531 Weakness: Secondary | ICD-10-CM

## 2022-09-20 DIAGNOSIS — F0390 Unspecified dementia without behavioral disturbance: Secondary | ICD-10-CM | POA: Diagnosis not present

## 2022-09-20 DIAGNOSIS — Z90710 Acquired absence of both cervix and uterus: Secondary | ICD-10-CM | POA: Diagnosis not present

## 2022-09-20 DIAGNOSIS — Z8249 Family history of ischemic heart disease and other diseases of the circulatory system: Secondary | ICD-10-CM | POA: Diagnosis not present

## 2022-09-20 DIAGNOSIS — Z7984 Long term (current) use of oral hypoglycemic drugs: Secondary | ICD-10-CM | POA: Diagnosis not present

## 2022-09-20 DIAGNOSIS — N390 Urinary tract infection, site not specified: Secondary | ICD-10-CM | POA: Diagnosis present

## 2022-09-20 DIAGNOSIS — R410 Disorientation, unspecified: Secondary | ICD-10-CM | POA: Diagnosis not present

## 2022-09-20 DIAGNOSIS — J9811 Atelectasis: Secondary | ICD-10-CM | POA: Diagnosis not present

## 2022-09-20 DIAGNOSIS — Z66 Do not resuscitate: Secondary | ICD-10-CM | POA: Diagnosis present

## 2022-09-20 DIAGNOSIS — R4182 Altered mental status, unspecified: Secondary | ICD-10-CM | POA: Diagnosis not present

## 2022-09-20 LAB — URINALYSIS, ROUTINE W REFLEX MICROSCOPIC
Bacteria, UA: NONE SEEN
Bilirubin Urine: NEGATIVE
Glucose, UA: NEGATIVE mg/dL
Ketones, ur: NEGATIVE mg/dL
Nitrite: NEGATIVE
Protein, ur: >=300 mg/dL — AB
RBC / HPF: >50 RBC/hpf — ABNORMAL HIGH (ref 0–5)
Specific Gravity, Urine: 1.01 (ref 1.005–1.030)
WBC, UA: >50 WBC/hpf — ABNORMAL HIGH (ref 0–5)
pH: 6 (ref 5.0–8.0)

## 2022-09-20 LAB — COMPREHENSIVE METABOLIC PANEL
ALT: 10 U/L (ref 0–44)
AST: 20 U/L (ref 15–41)
Albumin: 3.3 g/dL — ABNORMAL LOW (ref 3.5–5.0)
Alkaline Phosphatase: 37 U/L — ABNORMAL LOW (ref 38–126)
Anion gap: 11 (ref 5–15)
BUN: 17 mg/dL (ref 8–23)
CO2: 23 mmol/L (ref 22–32)
Calcium: 8.8 mg/dL — ABNORMAL LOW (ref 8.9–10.3)
Chloride: 96 mmol/L — ABNORMAL LOW (ref 98–111)
Creatinine, Ser: 0.69 mg/dL (ref 0.44–1.00)
GFR, Estimated: >60 mL/min (ref >60)
Glucose, Bld: 145 mg/dL — ABNORMAL HIGH (ref 70–99)
Potassium: 4.2 mmol/L (ref 3.5–5.1)
Sodium: 130 mmol/L — ABNORMAL LOW (ref 135–145)
Total Bilirubin: 1.2 mg/dL (ref 0.3–1.2)
Total Protein: 6.5 g/dL (ref 6.5–8.1)

## 2022-09-20 LAB — CBC
HCT: 37.5 % (ref 36.0–46.0)
Hemoglobin: 12.3 g/dL (ref 12.0–15.0)
MCH: 30.5 pg (ref 26.0–34.0)
MCHC: 32.8 g/dL (ref 30.0–36.0)
MCV: 93.1 fL (ref 80.0–100.0)
Platelets: 286 10*3/uL (ref 150–400)
RBC: 4.03 MIL/uL (ref 3.87–5.11)
RDW: 15 % (ref 11.5–15.5)
WBC: 7.4 10*3/uL (ref 4.0–10.5)
nRBC: 0 % (ref 0.0–0.2)

## 2022-09-20 LAB — SARS CORONAVIRUS 2 BY RT PCR: SARS Coronavirus 2 by RT PCR: POSITIVE — AB

## 2022-09-20 LAB — GLUCOSE, CAPILLARY: Glucose-Capillary: 140 mg/dL — ABNORMAL HIGH (ref 70–99)

## 2022-09-20 LAB — CBG MONITORING, ED: Glucose-Capillary: 173 mg/dL — ABNORMAL HIGH (ref 70–99)

## 2022-09-20 MED ORDER — SODIUM CHLORIDE 0.9 % IV SOLN: INTRAVENOUS | Status: DC

## 2022-09-20 MED ORDER — POLYETHYLENE GLYCOL 3350 17 G PO PACK: 17.0000 g | PACK | Freq: Every day | ORAL | Status: DC | PRN

## 2022-09-20 MED ORDER — SODIUM CHLORIDE 0.9 % IV SOLN
1.0000 g | INTRAVENOUS | Status: DC
  Administered 2022-09-21: 1 g via INTRAVENOUS
  Filled 2022-09-20: qty 10

## 2022-09-20 MED ORDER — INSULIN ASPART 100 UNIT/ML IJ SOLN: 0.0000 [IU] | Freq: Every day | INTRAMUSCULAR | Status: DC

## 2022-09-20 MED ORDER — SODIUM CHLORIDE 0.9 % IV SOLN
1.0000 g | Freq: Once | INTRAVENOUS | Status: AC
  Administered 2022-09-20: 1 g via INTRAVENOUS
  Filled 2022-09-20: qty 10

## 2022-09-20 MED ORDER — MOLNUPIRAVIR EUA 200MG CAPSULE
4.0000 | ORAL_CAPSULE | Freq: Two times a day (BID) | ORAL | Status: DC
  Administered 2022-09-20 – 2022-09-24 (×8): 800 mg via ORAL
  Filled 2022-09-20: qty 4

## 2022-09-20 MED ORDER — ONDANSETRON HCL 4 MG PO TABS: 4.0000 mg | ORAL_TABLET | Freq: Four times a day (QID) | ORAL | Status: DC | PRN

## 2022-09-20 MED ORDER — ATENOLOL 25 MG PO TABS
50.0000 mg | ORAL_TABLET | Freq: Two times a day (BID) | ORAL | Status: DC
  Administered 2022-09-20 – 2022-09-23 (×6): 50 mg via ORAL
  Filled 2022-09-20 (×6): qty 2

## 2022-09-20 MED ORDER — APIXABAN 5 MG PO TABS
5.0000 mg | ORAL_TABLET | Freq: Two times a day (BID) | ORAL | Status: DC
  Administered 2022-09-20 – 2022-09-24 (×8): 5 mg via ORAL
  Filled 2022-09-20 (×8): qty 1

## 2022-09-20 MED ORDER — ONDANSETRON HCL 4 MG/2ML IJ SOLN: 4.0000 mg | Freq: Four times a day (QID) | INTRAMUSCULAR | Status: DC | PRN

## 2022-09-20 MED ORDER — INSULIN ASPART 100 UNIT/ML IJ SOLN
0.0000 [IU] | Freq: Three times a day (TID) | INTRAMUSCULAR | Status: DC
  Administered 2022-09-21 (×2): 2 [IU] via SUBCUTANEOUS
  Administered 2022-09-22: 3 [IU] via SUBCUTANEOUS
  Administered 2022-09-22: 2 [IU] via SUBCUTANEOUS
  Administered 2022-09-22: 3 [IU] via SUBCUTANEOUS
  Administered 2022-09-23: 2 [IU] via SUBCUTANEOUS
  Administered 2022-09-23: 3 [IU] via SUBCUTANEOUS
  Administered 2022-09-23 – 2022-09-24 (×3): 2 [IU] via SUBCUTANEOUS

## 2022-09-20 MED ORDER — ACETAMINOPHEN 325 MG PO TABS: 650.0000 mg | ORAL_TABLET | Freq: Four times a day (QID) | ORAL | Status: DC | PRN

## 2022-09-20 NOTE — ED Provider Notes (Signed)
[facility] EMERGENCY DEPARTMENT Provider Note   CSN: [id number] Arrival date & time: 09/20/22  1441     History  Chief Complaint  Patient presents with   Weakness   Altered Mental Status    [name] is a 88 y.o. female.   Weakness Altered Mental Status Associated symptoms: weakness      Patient has history of PSVT coronary artery disease paroxysmal A-fib anemia, diabetes, urinary tract infections, chronic kidney disease, diverticulitis who presents to the ED for evaluation of weakness and confusion.  Daughter states she did ordered noticing some symptoms about 4 days ago.  She was just a little bit confused and it was somewhat mild.  Over the last few days however has progressed.  She is also become increasingly weak.  Today she was unable to get out of bed.  Patient has had these type of symptoms before when she is having infections.  She was admitted to the hospital on September 30 with similar symptoms.  She has not had any vomiting or diarrhea.  No fevers or chills.  No cough.  No chest pain or shortness of breath.  Home Medications Prior to Admission medications   Medication Sig Start Date End Date Taking? Authorizing Provider  acetaminophen (TYLENOL) 500 MG tablet Take 1,000 mg by mouth 2 (two) times daily.    [provider]  apixaban (ELIQUIS) 5 MG TABS tablet Take 5 mg by mouth 2 (two) times daily.    [provider]  atenolol (TENORMIN) 50 MG tablet TAKE 1 TABLET BY MOUTH TWICE A DAY Patient taking differently: Take 50 mg by mouth 2 (two) times daily. 08/07/21   Satira Sark, MD  meloxicam (MOBIC) 7.5 MG tablet Take 7.5 mg by mouth every other day. 02/09/22   [provider]  metFORMIN (GLUCOPHAGE-XR) 500 MG 24 hr tablet Take 2 tabs (1,000mg ) by mouth every morning & 1 tab (500mg ) every evening 03/24/13   [provider]  simvastatin (ZOCOR) 10 MG tablet Take 10 mg by mouth at bedtime.    [provider]       Allergies    Toprol xl [metoprolol succinate]    Review of Systems   Review of Systems  Neurological:  Positive for weakness.    Physical Exam Updated Vital Signs BP 134/80   Pulse 87   Temp 98.7 F (37.1 C) (Oral)   Resp (!) 23   Wt 66 kg   SpO2 98%   BMI 25.77 kg/m  Physical Exam Vitals and nursing note reviewed.  Constitutional:      Comments: Elderly, frail  HENT:     Head: Normocephalic and atraumatic.     Right Ear: External ear normal.     Left Ear: External ear normal.  Eyes:     General: No scleral icterus.       Right eye: No discharge.        Left eye: No discharge.     Conjunctiva/sclera: Conjunctivae normal.  Neck:     Trachea: No tracheal deviation.  Cardiovascular:     Rate and Rhythm: Normal rate and regular rhythm.  Pulmonary:     Effort: Pulmonary effort is normal. No respiratory distress.     Breath sounds: Normal breath sounds. No stridor. No wheezing or rales.  Abdominal:     General: Bowel sounds are normal. There is no distension.     Palpations: Abdomen is soft.     Tenderness: There is no abdominal tenderness. There  is no guarding or rebound.  Musculoskeletal:        General: No tenderness or deformity.     Cervical back: Neck supple.  Skin:    General: Skin is warm and dry.     Findings: No rash.  Neurological:     General: No focal deficit present.     Mental Status: She is alert.     Cranial Nerves: No cranial nerve deficit (no facial droop, extraocular movements intact, no slurred speech).     Sensory: No sensory deficit.     Motor: Weakness present. No abnormal muscle tone or seizure activity.     Coordination: Coordination normal.     Comments: Patient confused about why she is here.  She forgot that her husband passed away.  She follows commands.  Able to lift both arms and legs off the bed,  Psychiatric:        Mood and Affect: Mood normal.     ED Results / Procedures / Treatments   Labs (all labs ordered are listed,  but only abnormal results are displayed) Labs Reviewed  SARS CORONAVIRUS 2 BY RT PCR - Abnormal; Notable for the following components:      Result Value   SARS Coronavirus 2 by RT PCR POSITIVE (*)    All other components within normal limits  URINALYSIS, ROUTINE W REFLEX MICROSCOPIC - Abnormal; Notable for the following components:   Color, Urine AMBER (*)    APPearance TURBID (*)    Hgb urine dipstick MODERATE (*)    Protein, ur >=300 (*)    Leukocytes,Ua LARGE (*)    RBC / HPF >50 (*)    WBC, UA >50 (*)    Non Squamous Epithelial 0-5 (*)    All other components within normal limits  COMPREHENSIVE METABOLIC PANEL - Abnormal; Notable for the following components:   Sodium 130 (*)    Chloride 96 (*)    Glucose, Bld 145 (*)    Calcium 8.8 (*)    Albumin 3.3 (*)    Alkaline Phosphatase 37 (*)    All other components within normal limits  CBG MONITORING, ED - Abnormal; Notable for the following components:   Glucose-Capillary 173 (*)    All other components within normal limits  URINE CULTURE  CBC    EKG EKG Interpretation  Date/Time:  Thursday September 20 2022 14:59:09 EDT Ventricular Rate:  89 PR Interval:    QRS Duration: 82 QT Interval:  358 QTC Calculation: 436 R Axis:   78 Text Interpretation: Atrial fibrillation Ventricular premature complex Borderline low voltage, extremity leads Minimal ST depression, anterolateral leads No significant change since last tracing Confirmed by Dorie Rank [phone]) on 09/20/2022 6:16:54 PM  Radiology DG Chest Portable 1 View  Result Date: 09/20/2022 CLINICAL DATA:  Weakness EXAM: PORTABLE CHEST 1 VIEW COMPARISON:  08/18/2022 FINDINGS: Stable cardiomegaly. Aortic atherosclerosis. Very low lung volumes. Mild bibasilar atelectasis. No pleural effusion or pneumothorax. IMPRESSION: Very low lung volumes with mild bibasilar atelectasis. Electronically Signed   By: Davina Poke D.O.   On: 09/20/2022 15:56   CT Head Wo Contrast  Result  Date: 09/20/2022 CLINICAL DATA:  Altered mental status. EXAM: CT HEAD WITHOUT CONTRAST TECHNIQUE: Contiguous axial images were obtained from the base of the skull through the vertex without intravenous contrast. RADIATION DOSE REDUCTION: This exam was performed according to the departmental dose-optimization program which includes automated exposure control, adjustment of the mA and/or kV according to patient size and/or use of iterative  reconstruction technique. COMPARISON:  07/06/2022 FINDINGS: Brain: Stable age related cerebral atrophy, ventriculomegaly and periventricular white matter disease. No extra-axial fluid collections are identified. No CT findings for acute hemispheric infarction or intracranial hemorrhage. No mass lesions. The brainstem and cerebellum are normal. Vascular: Stable advanced vascular calcifications but no aneurysm or hyperdense vessels. Skull: No skull fracture or bone lesions. Sinuses/Orbits: The paranasal sinuses and mastoid air cells are clear. The globes are intact. Other: No scalp lesions or scalp hematoma. IMPRESSION: 1. Stable age related cerebral atrophy, ventriculomegaly and periventricular white matter disease. 2. No acute intracranial findings or mass lesions. Electronically Signed   By: Marijo Sanes M.D.   On: 09/20/2022 15:45    Procedures Procedures    Medications Ordered in ED Medications  cefTRIAXone (ROCEPHIN) 1 g in sodium chloride 0.9 % 100 mL IVPB (1 g Intravenous New Bag/Given 09/20/22 1811)    ED Course/ Medical Decision Making/ A&P Clinical Course as of 09/20/22 1916  Thu Sep 20, 2022  1713 Urinalysis, Routine w reflex microscopic Urine, In & Out Cath(!) Urinalysis suggests infection [JK]  1713 CBC CBC normal [JK]  1816 Comprehensive metabolic panel(!) Hyponatremia noted.  Similar to previous values [JK]  1816 SARS Coronavirus 2 by RT PCR (hospital order, performed in [facility] hospital lab) *cepheid single result test* Anterior Nasal  Swab(!) Covid test is positive [JK]  1826 Chest x-ray and head CT without acute findings. [JK]  1916 Case discussed with Dr. Denton Brick regarding admission [JK]    Clinical Course User Index [JK] Dorie Rank, MD                           Medical Decision Making Problems Addressed: Acute cystitis without hematuria: acute illness or injury that poses a threat to life or bodily functions COVID-19 virus infection: acute illness or injury Hyponatremia: chronic illness or injury  Amount and/or Complexity of Data Reviewed Labs: ordered. Decision-making details documented in ED Course. Radiology: ordered and independent interpretation performed.  Risk Decision regarding hospitalization.   Presented to the ED for weakness, malaise.  ED work-up notable for urinalysis consistent with urinary tract infection.  Patient has had recurrent issues with this in the past.  Patient is also COVID-positive.  Fortunately no signs of pneumonia on x-ray.  She does not have an oxygen requirement.  No respiratory difficulty.  Patient noted to be hyponatremic but this is chronic and unchanged.  Suspect the covid infection and the UTI are contributing to her weakness and confusion.  Patient has been given IV antibiotics.  Patient is normally able to ambulate at home.  With her weakness and infection I will consult the medical service for admission.          Final Clinical Impression(s) / ED Diagnoses Final diagnoses:  Acute cystitis without hematuria  COVID-19 virus infection  Hyponatremia    Rx / DC Orders ED Discharge Orders     None         Dorie Rank, MD 09/20/22 1916

## 2022-09-20 NOTE — Assessment & Plan Note (Addendum)
Confusion.  Head CT shows stable chronic findings.  Encephalopathy likely secondary to UTI and COVID-19 infection.  She also appears dehydrated.

## 2022-09-20 NOTE — Assessment & Plan Note (Signed)
Rate controlled and on anticoagulation. -Resume atenolol and Eliquis

## 2022-09-20 NOTE — H&P (Signed)
History and Physical    [name] EUM:[id number] DOB: 10-14-1927 DOA: 09/20/2022  PCP: Manon Hilding, MD   Patient coming from: Home  I have personally briefly reviewed patient's old medical records in [place]  Chief Complaint: Weakness, confusion  HPI: [name] is a 88 y.o. female with medical history significant for diabetes mellitus, atrial fibrillation on chronic anticoagulation, hypertension, CKD 3, gout. Patient was brought to the ED from home by EMS with reports of weakness and confusion.  Patient's daughter- [name] is at bedside and provides the history.  Patient lives at home, but family is with patient's 24 hours a day.  With time of my evaluation, patient is awake oriented to person only, she is unable to answer questions or give details.   Patient's daughter reports that patient weakness started on Monday-4 days ago, she updated her COVID shot that day.  Since then she has progressively gotten more weak, that she was unable to get out of bed and confused.  She could not follow directions, and yesterday she was saying "gibberish".  She reports patient is a high fall risk.  She ambulates with a walker.  She has a mild chronic cough at night which is unchanged, no difficulty breathing.  No fever no chills no vomiting no loose stools.  Recent hospitalization 9/30 to 10/3 for altered mental status thought multifactorial in etiology -Per natremia, diverticulitis, cystitis.    ED Course: Tmax 98.7.  Heart rate 63-108.  Respiratory rate 17-24.  Blood pressure systolic 540-086.  O2 sat 95 to 100% on room air. Sodium 130.  UA with large leukocytes.  COVID test positive.  Chest x-ray low but shows bibasilar atelectasis.  EKG shows atrial fibrillation.  Head CT shows stable chronic findings. IV ceftriaxone started.  Hospitalist admit for altered mental status likely secondary to COVID and UTI.  Review of Systems: As per HPI all other systems reviewed and negative.  Past  Medical History:  Diagnosis Date   Atrial fibrillation ([place])    Coronary atherosclerosis of native coronary artery    Nonobstructive   Dyslipidemia    PSVT (paroxysmal supraventricular tachycardia)    TIA (transient ischemic attack)    Type 2 diabetes mellitus ([place])    Diet controlled    Past Surgical History:  Procedure Laterality Date   ABDOMINAL HYSTERECTOMY     CARPAL TUNNEL RELEASE     x's 2   CHOLECYSTECTOMY     REPLACEMENT TOTAL KNEE  2002 & 2012   SHOULDER SURGERY  2001     reports that she has never smoked. She has never used smokeless tobacco. She reports that she does not drink alcohol and does not use drugs.  Allergies  Allergen Reactions   Toprol Xl [Metoprolol Succinate] Other (See Comments)    HAIR LOSS    Family History  Problem Relation Age of Onset   Coronary artery disease Mother    Prior to Admission medications   Medication Sig Start Date End Date Taking? Authorizing Provider  acetaminophen (TYLENOL) 500 MG tablet Take 1,000 mg by mouth 2 (two) times daily.    [provider]  apixaban (ELIQUIS) 5 MG TABS tablet Take 5 mg by mouth 2 (two) times daily.    [provider]  atenolol (TENORMIN) 50 MG tablet TAKE 1 TABLET BY MOUTH TWICE A DAY Patient taking differently: Take 50 mg by mouth 2 (two) times daily. 08/07/21   Satira Sark, MD  meloxicam (MOBIC) 7.5 MG  tablet Take 7.5 mg by mouth every other day. 02/09/22   [provider]  metFORMIN (GLUCOPHAGE-XR) 500 MG 24 hr tablet Take 2 tabs (1,000mg ) by mouth every morning & 1 tab (500mg ) every evening 03/24/13   [provider]  simvastatin (ZOCOR) 10 MG tablet Take 10 mg by mouth at bedtime.    [provider]    Physical Exam: Vitals:   09/20/22 1500 09/20/22 1508 09/20/22 1530 09/20/22 1600  BP: (!) 143/73  127/71 134/80  Pulse: (!) 108  95 87  Resp: (!) 24 20 (!) 27 (!) 23  Temp:      TempSrc:      SpO2: 100%  95% 98%  Weight:         Constitutional: NAD, calm, comfortable Vitals:   09/20/22 1500 09/20/22 1508 09/20/22 1530 09/20/22 1600  BP: (!) 143/73  127/71 134/80  Pulse: (!) 108  95 87  Resp: (!) 24 20 (!) 27 (!) 23  Temp:      TempSrc:      SpO2: 100%  95% 98%  Weight:       Eyes: PERRL, lids and conjunctivae normal ENMT: Mucous membranes are moist. .  Neck: normal, supple, no masses, no thyromegaly Respiratory: clear to auscultation bilaterally, no wheezing, no crackles. Normal respiratory effort. No accessory muscle use.  Cardiovascular: Regular rate and rhythm, no murmurs / rubs / gallops. No extremity edema.  Lower extremities warm. Abdomen: no tenderness, no masses palpated. No hepatosplenomegaly. Bowel sounds positive.  Musculoskeletal: no clubbing / cyanosis. No joint deformity upper and lower extremities.  Skin: no rashes, lesions, ulcers. No induration Neurologic: No apparent cranial nerve abnormality moving extremities spontaneously. Psychiatric: Awake and alert oriented to person only..   Labs on Admission: I have personally reviewed following labs and imaging studies  CBC: Recent Labs  Lab 09/20/22 1452  WBC 7.4  HGB 12.3  HCT 37.5  MCV 93.1  PLT 244   Basic Metabolic Panel: Recent Labs  Lab 09/20/22 1723  NA 130*  K 4.2  CL 96*  CO2 23  GLUCOSE 145*  BUN 17  CREATININE 0.69  CALCIUM 8.8*   GFR: Estimated Creatinine Clearance: 40.1 mL/min (by C-G formula based on SCr of 0.69 mg/dL). Liver Function Tests: Recent Labs  Lab 09/20/22 1723  AST 20  ALT 10  ALKPHOS 37*  BILITOT 1.2  PROT 6.5  ALBUMIN 3.3*   CBG: Recent Labs  Lab 09/20/22 1510  GLUCAP 173*   Urine analysis:    Component Value Date/Time   COLORURINE AMBER (A) 09/20/2022 1452   APPEARANCEUR TURBID (A) 09/20/2022 1452   LABSPEC 1.010 09/20/2022 1452   PHURINE 6.0 09/20/2022 1452   GLUCOSEU NEGATIVE 09/20/2022 1452   HGBUR MODERATE (A) 09/20/2022 1452   BILIRUBINUR NEGATIVE 09/20/2022 1452    KETONESUR NEGATIVE 09/20/2022 1452   PROTEINUR >=300 (A) 09/20/2022 1452   NITRITE NEGATIVE 09/20/2022 1452   LEUKOCYTESUR LARGE (A) 09/20/2022 1452    Radiological Exams on Admission: DG Chest Portable 1 View  Result Date: 09/20/2022 CLINICAL DATA:  Weakness EXAM: PORTABLE CHEST 1 VIEW COMPARISON:  08/18/2022 FINDINGS: Stable cardiomegaly. Aortic atherosclerosis. Very low lung volumes. Mild bibasilar atelectasis. No pleural effusion or pneumothorax. IMPRESSION: Very low lung volumes with mild bibasilar atelectasis. Electronically Signed   By: Davina Poke D.O.   On: 09/20/2022 15:56   CT Head Wo Contrast  Result Date: 09/20/2022 CLINICAL DATA:  Altered mental status. EXAM: CT HEAD WITHOUT CONTRAST TECHNIQUE: Contiguous axial images  were obtained from the base of the skull through the vertex without intravenous contrast. RADIATION DOSE REDUCTION: This exam was performed according to the departmental dose-optimization program which includes automated exposure control, adjustment of the mA and/or kV according to patient size and/or use of iterative reconstruction technique. COMPARISON:  07/06/2022 FINDINGS: Brain: Stable age related cerebral atrophy, ventriculomegaly and periventricular white matter disease. No extra-axial fluid collections are identified. No CT findings for acute hemispheric infarction or intracranial hemorrhage. No mass lesions. The brainstem and cerebellum are normal. Vascular: Stable advanced vascular calcifications but no aneurysm or hyperdense vessels. Skull: No skull fracture or bone lesions. Sinuses/Orbits: The paranasal sinuses and mastoid air cells are clear. The globes are intact. Other: No scalp lesions or scalp hematoma. IMPRESSION: 1. Stable age related cerebral atrophy, ventriculomegaly and periventricular white matter disease. 2. No acute intracranial findings or mass lesions. Electronically Signed   By: Marijo Sanes M.D.   On: 09/20/2022 15:45    EKG: Independently  reviewed.  Atrial fibrillation rate 89, QTc 436.  No significant change from prior.  Assessment/Plan Principal Problem:   Acute metabolic encephalopathy Active Problems:   COVID-19 virus infection   UTI (urinary tract infection)   Atrial fibrillation (HCC)   Hyponatremia   Type 2 diabetes mellitus ([place])   Current use of long term anticoagulation   Generalized weakness   Assessment and Plan: * Acute metabolic encephalopathy Confusion.  Head CT shows stable chronic findings.  Encephalopathy likely secondary to UTI and COVID-19 infection.  She also appears dehydrated.  COVID-19 virus infection COVID-19 infection.  Up-to-date on all her COVID shots most recent COVID shot- 09/17/22.  No respiratory symptoms.  Chest x-ray showing bibasilar atelectasis.  On room air. - Molnupiravir, as she is on anticoagulation with Eliquis.    Atrial fibrillation (HCC) Rate controlled and on anticoagulation. -Resume atenolol and Eliquis  UTI (urinary tract infection) UA suggestive of UTI with positive leukocytes. Last urine cultures 6/23- pansensitive Klebsiella.  Rules out for sepsis.  Afebrile.  WBC 7.4. -IV ceftriaxone 1 g daily -Follow-up urine cultures  Hyponatremia Sodium 130.  Chronic hyponatremia over the past 2 months sodium has ranged from 120-130. -Hydrate N/s 75cc/hr x 1 day  Type 2 diabetes mellitus (HCC) Hold metformin - SSI- S  Generalized weakness Likely secondary to COVID infection and UTI.  Daughter reports of high fall risk. -PT eval prior to discharge   DVT prophylaxis: Eliquis Code Status: DNR-family patient's daughter [name] at bedside. Family Communication: Daughter [name], present at bedside and is patient's power of attorney.  Disposition Plan: ~ 2 days Consults called: None  Admission status: Inpt Medsurg I certify that at the point of admission it is my clinical judgment that the patient will require inpatient hospital care spanning beyond 2 midnights from the  point of admission due to high intensity of service, high risk for further deterioration and high frequency of surveillance required.     Author: Bethena Roys, MD 09/20/2022 10:13 PM  For on call review [email or web address].

## 2022-09-20 NOTE — Assessment & Plan Note (Addendum)
COVID-19 infection.  Up-to-date on all her COVID shots most recent COVID shot- 09/17/22.  No respiratory symptoms.  Chest x-ray showing bibasilar atelectasis.  On room air. - Molnupiravir, as she is on anticoagulation with Eliquis.

## 2022-09-20 NOTE — Assessment & Plan Note (Signed)
Likely secondary to COVID infection and UTI.  Daughter reports of high fall risk. -PT eval prior to discharge

## 2022-09-20 NOTE — ED Triage Notes (Signed)
Pt bib EMS from home with increased weakness and confusion per daughters for past 4 days.pt is A&O to self and DOB. Pt is usually alert and orient x 4 per daughter.

## 2022-09-20 NOTE — Assessment & Plan Note (Addendum)
Sodium 130.  Chronic hyponatremia over the past 2 months sodium has ranged from 120-130. -Hydrate N/s 75cc/hr x 1 day

## 2022-09-20 NOTE — Assessment & Plan Note (Signed)
Hold metformin - SSI- S

## 2022-09-20 NOTE — Assessment & Plan Note (Signed)
UA suggestive of UTI with positive leukocytes. Last urine cultures 6/23- pansensitive Klebsiella.  Rules out for sepsis.  Afebrile.  WBC 7.4. -IV ceftriaxone 1 g daily -Follow-up urine cultures

## 2022-09-21 ENCOUNTER — Other Ambulatory Visit: Payer: Self-pay

## 2022-09-21 DIAGNOSIS — G9341 Metabolic encephalopathy: Secondary | ICD-10-CM | POA: Diagnosis not present

## 2022-09-21 LAB — CBC WITH DIFFERENTIAL/PLATELET
Abs Immature Granulocytes: 0 10*3/uL (ref 0.00–0.07)
Band Neutrophils: 24 %
Basophils Absolute: 0 10*3/uL (ref 0.0–0.1)
Basophils Relative: 0 %
Eosinophils Absolute: 0.1 10*3/uL (ref 0.0–0.5)
Eosinophils Relative: 2 %
HCT: 33.1 % — ABNORMAL LOW (ref 36.0–46.0)
Hemoglobin: 11.1 g/dL — ABNORMAL LOW (ref 12.0–15.0)
Lymphocytes Relative: 15 %
Lymphs Abs: 0.7 10*3/uL (ref 0.7–4.0)
MCH: 30.5 pg (ref 26.0–34.0)
MCHC: 33.5 g/dL (ref 30.0–36.0)
MCV: 90.9 fL (ref 80.0–100.0)
Monocytes Absolute: 0.3 10*3/uL (ref 0.1–1.0)
Monocytes Relative: 6 %
Neutro Abs: 3.4 10*3/uL (ref 1.7–7.7)
Neutrophils Relative %: 53 %
Platelets: 266 10*3/uL (ref 150–400)
RBC: 3.64 MIL/uL — ABNORMAL LOW (ref 3.87–5.11)
RDW: 15.3 % (ref 11.5–15.5)
WBC Morphology: INCREASED
WBC: 4.4 10*3/uL (ref 4.0–10.5)
nRBC: 0 % (ref 0.0–0.2)

## 2022-09-21 LAB — MAGNESIUM: Magnesium: 1.3 mg/dL — ABNORMAL LOW (ref 1.7–2.4)

## 2022-09-21 LAB — COMPREHENSIVE METABOLIC PANEL
ALT: 9 U/L (ref 0–44)
AST: 14 U/L — ABNORMAL LOW (ref 15–41)
Albumin: 3.2 g/dL — ABNORMAL LOW (ref 3.5–5.0)
Alkaline Phosphatase: 40 U/L (ref 38–126)
Anion gap: 11 (ref 5–15)
BUN: 20 mg/dL (ref 8–23)
CO2: 21 mmol/L — ABNORMAL LOW (ref 22–32)
Calcium: 8.8 mg/dL — ABNORMAL LOW (ref 8.9–10.3)
Chloride: 97 mmol/L — ABNORMAL LOW (ref 98–111)
Creatinine, Ser: 0.73 mg/dL (ref 0.44–1.00)
GFR, Estimated: >60 mL/min (ref >60)
Glucose, Bld: 157 mg/dL — ABNORMAL HIGH (ref 70–99)
Potassium: 4.2 mmol/L (ref 3.5–5.1)
Sodium: 129 mmol/L — ABNORMAL LOW (ref 135–145)
Total Bilirubin: 1.3 mg/dL — ABNORMAL HIGH (ref 0.3–1.2)
Total Protein: 6.4 g/dL — ABNORMAL LOW (ref 6.5–8.1)

## 2022-09-21 LAB — GLUCOSE, CAPILLARY
Glucose-Capillary: 151 mg/dL — ABNORMAL HIGH (ref 70–99)
Glucose-Capillary: 126 mg/dL — ABNORMAL HIGH (ref 70–99)
Glucose-Capillary: 183 mg/dL — ABNORMAL HIGH (ref 70–99)
Glucose-Capillary: 160 mg/dL — ABNORMAL HIGH (ref 70–99)

## 2022-09-21 LAB — PHOSPHORUS: Phosphorus: 2.9 mg/dL (ref 2.5–4.6)

## 2022-09-21 LAB — FERRITIN: Ferritin: 82 ng/mL (ref 11–307)

## 2022-09-21 LAB — C-REACTIVE PROTEIN: CRP: 18.8 mg/dL — ABNORMAL HIGH (ref <1.0)

## 2022-09-21 LAB — D-DIMER, QUANTITATIVE: D-Dimer, Quant: 0.55 ug/mL-FEU — ABNORMAL HIGH (ref 0.00–0.50)

## 2022-09-21 MED ORDER — MAGNESIUM SULFATE 4 GM/100ML IV SOLN
4.0000 g | Freq: Once | INTRAVENOUS | Status: AC
  Administered 2022-09-21: 4 g via INTRAVENOUS
  Filled 2022-09-21: qty 100

## 2022-09-21 MED ORDER — SODIUM CHLORIDE 0.9 % IV SOLN: INTRAVENOUS | Status: AC

## 2022-09-21 NOTE — Progress Notes (Signed)
Orders obtained from Dr. Pierce Crane for telemetry due to irregular HR and increased respirations. Patient placed on telemetry [id number]-X10 and second verified by Nira Conn, LPN.

## 2022-09-21 NOTE — TOC Initial Note (Signed)
Transition of Care [facility]) - Initial/Assessment Note    Patient Details  Name: [name] MRN: [id number] Date of Birth: [birth year]  Transition of [facility]) CM/SW Contact:    Ihor Gully, LCSW Phone Number: 09/21/2022, 3:23 PM  Clinical Narrative:                 Patient from home alone. Admitted with UTI and COVID+. Daughter, [name], brings her meals and stays several nights a week with patient.  Patient recommended for SNF. Patient recently in SNF and was discharged on 10/19 after 18 days. Patient was in [place] rehab and family would like for her to return if possible. Discussed that patient would likely not accepted until patient was 11 day from COVID diagnosis.     Barriers to Discharge: Continued Medical Work up   Patient Goals and CMS Choice Patient states their goals for this hospitalization and ongoing recovery are:: unure      Expected Discharge Plan and Services         Living arrangements for the past 2 months: Single Family Home                                      Prior Living Arrangements/Services Living arrangements for the past 2 months: Single Family Home Lives with:: Self Patient language and need for interpreter reviewed:: Yes Do you feel safe going back to the place where you live?: Yes      Need for Family Participation in Patient Care: Yes (Comment) Care giver support system in place?: Yes (comment) Current home services: DME Criminal Activity/Legal Involvement Pertinent to Current Situation/Hospitalization: No - Comment as needed  Activities of Daily Living Home Assistive Devices/Equipment: Blood pressure cuff, Cane (specify quad or straight), CBG Meter, Dentures (specify type), Eyeglasses, Hearing aid, Hand-held shower hose, Raised toilet seat with rails, Scales, Shower chair with back, Environmental consultant (specify type), Wheelchair ADL Screening (condition at time of admission) Patient's cognitive ability adequate to safely complete daily  activities?: No Is the patient deaf or have difficulty hearing?: Yes Does the patient have difficulty seeing, even when wearing glasses/contacts?: No Does the patient have difficulty concentrating, remembering, or making decisions?: Yes Patient able to express need for assistance with ADLs?: No Does the patient have difficulty dressing or bathing?: Yes Independently performs ADLs?: No Communication: Independent Dressing (OT): Needs assistance Is this a change from baseline?: Change from baseline, expected to last <3days Grooming: Needs assistance Is this a change from baseline?: Change from baseline, expected to last <3 days Feeding: Independent Bathing: Needs assistance Is this a change from baseline?: Change from baseline, expected to last <3 days Toileting: Needs assistance Is this a change from baseline?: Change from baseline, expected to last <3 days In/Out Bed: Needs assistance Is this a change from baseline?: Change from baseline, expected to last <3 days Walks in Home: Needs assistance Is this a change from baseline?: Change from baseline, expected to last <3 days Does the patient have difficulty walking or climbing stairs?: Yes Weakness of Legs: Both Weakness of Arms/Hands: Both  Permission Sought/Granted Permission sought to share information with : Family Supports    Share Information with NAME: daughter, [name]           Emotional Assessment     Affect (typically observed): Appropriate Orientation: : Oriented to Place Alcohol / Substance Use: Not Applicable Psych Involvement: No (comment)  Admission diagnosis:  Hyponatremia [E87.1] Acute cystitis without hematuria [B15.17] Acute metabolic encephalopathy [O16.07] COVID-19 virus infection [U07.1] Patient Active Problem List   Diagnosis Date Noted   COVID-19 virus infection 09/20/2022   Diverticulitis of colon    Pressure injury of skin 08/18/2022   Atrial fibrillation ([place])    Dyslipidemia    Type 2  diabetes mellitus ([place])    Hypomagnesemia 05/03/2022   Anemia of chronic disease 05/03/2022   B12 deficiency 05/03/2022   Hyponatremia 05/02/2022   Paroxysmal atrial fibrillation ([place]) 05/02/2022   UTI (urinary tract infection) 37/08/6268   Acute metabolic encephalopathy 48/54/6270   Acute cystitis without hematuria 12/01/2021   Current use of long term anticoagulation 08/15/2021   Generalized weakness 08/15/2021   Hyperpotassemia 07/25/2017   Body mass index 29.0-29.9, adult 07/12/2017   Low back pain 07/12/2017   Cough 03/28/2017   Proteinuria 11/21/2016   Diarrhea 11/21/2016   Chronic kidney disease, stage III (moderate) ([place]) 11/19/2016   Benign paroxysmal positional vertigo 08/27/2016   Pure hypercholesterolemia 07/28/2014   Vascular insufficiency of intestine ([place]) 06/23/2014   Gouty arthropathy 06/11/2014   Obesity 07/24/2013   Osteoarthrosis 07/24/2013   Reflux esophagitis 07/15/2013   Coronary atherosclerosis of native coronary artery 10/13/2012   Essential hypertension, benign 09/12/2011   PSVT (paroxysmal supraventricular tachycardia) 08/19/2009   PCP:  Manon Hilding, MD Pharmacy:   [id number] [place], [place] [address] [place] [address] Phone: [phone] Fax: [phone]  [place], [place] [address] [place] [address] [place] [address] Phone: [phone] Fax: [phone]     Social Determinants of Health (SDOH) Interventions Housing Interventions: Intervention Not Indicated  Readmission Risk Interventions     No data to display

## 2022-09-21 NOTE — NC FL2 (Signed)
[place] MEDICAID FL2 LEVEL OF CARE SCREENING TOOL     IDENTIFICATION  Patient Name: [name] Birthdate: Apr 03, 1927 Sex: female Admission Date (Current Location): 09/20/2022  [facility] and [place] Number:      Facility and Address:  [facility] [address], [place]      Provider Number: [id number]  Attending Physician Name and Address:  Roxan Hockey, MD  Relative Name and Phone Number:  BREKYN, HUNTOON (Daughter) [phone]    Current Level of Care: Hospital Recommended Level of Care: [place] Prior Approval Number:    Date Approved/Denied:   PASRR Number: [id number]  Discharge Plan: SNF    Current Diagnoses: Patient Active Problem List   Diagnosis Date Noted   COVID-19 virus infection 09/20/2022   Diverticulitis of colon    Pressure injury of skin 08/18/2022   Atrial fibrillation ([place])    Dyslipidemia    Type 2 diabetes mellitus ([place])    Hypomagnesemia 05/03/2022   Anemia of chronic disease 05/03/2022   B12 deficiency 05/03/2022   Hyponatremia 05/02/2022   Paroxysmal atrial fibrillation ([place]) 05/02/2022   UTI (urinary tract infection) 27/01/5008   Acute metabolic encephalopathy 38/18/2993   Acute cystitis without hematuria 12/01/2021   Current use of long term anticoagulation 08/15/2021   Generalized weakness 08/15/2021   Hyperpotassemia 07/25/2017   Body mass index 29.0-29.9, adult 07/12/2017   Low back pain 07/12/2017   Cough 03/28/2017   Proteinuria 11/21/2016   Diarrhea 11/21/2016   Chronic kidney disease, stage III (moderate) ([place]) 11/19/2016   Benign paroxysmal positional vertigo 08/27/2016   Pure hypercholesterolemia 07/28/2014   Vascular insufficiency of intestine ([place]) 06/23/2014   Gouty arthropathy 06/11/2014   Obesity 07/24/2013   Osteoarthrosis 07/24/2013   Reflux esophagitis 07/15/2013   Coronary atherosclerosis of native coronary artery 10/13/2012   Essential hypertension, benign  09/12/2011   PSVT (paroxysmal supraventricular tachycardia) 08/19/2009    Orientation RESPIRATION BLADDER Height & Weight     Self  O2 (1.5L) Incontinent Weight: 135 lb 12.9 oz (61.6 kg) Height:  5' (152.4 cm)  BEHAVIORAL SYMPTOMS/MOOD NEUROLOGICAL BOWEL NUTRITION STATUS      Incontinent Diet (heart healthy/carb modified)  AMBULATORY STATUS COMMUNICATION OF NEEDS Skin   Extensive Assist Verbally Normal                       Personal Care Assistance Level of Assistance  Bathing, Feeding, Dressing Bathing Assistance: Maximum assistance Feeding assistance: Limited assistance Dressing Assistance: Maximum assistance     Functional Limitations Info  Sight, Hearing, Speech Sight Info: Adequate Hearing Info: Adequate Speech Info: Adequate    SPECIAL CARE FACTORS FREQUENCY  PT (By licensed PT)     PT Frequency: 5x/week              Contractures      Additional Factors Info  Code Status, Allergies Code Status Info: DNR Allergies Info: Toprol XI           Current Medications (09/21/2022):  This is the current hospital active medication list Current Facility-Administered Medications  Medication Dose Route Frequency Provider Last Rate Last Admin   0.9 %  sodium chloride infusion   Intravenous Continuous Emokpae, Ejiroghene E, MD 75 mL/hr at 09/21/22 1522 Infusion Verify at 09/21/22 1522   acetaminophen (TYLENOL) tablet 650 mg  650 mg Oral Q6H PRN Emokpae, Ejiroghene E, MD       apixaban (ELIQUIS) tablet 5 mg  5 mg Oral BID Emokpae, Ejiroghene E,  MD   5 mg at 09/21/22 0835   atenolol (TENORMIN) tablet 50 mg  50 mg Oral BID Emokpae, Ejiroghene E, MD   50 mg at 09/21/22 0835   cefTRIAXone (ROCEPHIN) 1 g in sodium chloride 0.9 % 100 mL IVPB  1 g Intravenous Q24H Emokpae, Ejiroghene E, MD       insulin aspart (novoLOG) injection 0-5 Units  0-5 Units Subcutaneous QHS Emokpae, Ejiroghene E, MD       insulin aspart (novoLOG) injection 0-9 Units  0-9 Units Subcutaneous TID WC  Emokpae, Ejiroghene E, MD   2 Units at 09/21/22 0835   molnupiravir EUA (LAGEVRIO) capsule 800 mg  4 capsule Oral BID Emokpae, Ejiroghene E, MD   800 mg at 09/21/22 0836   ondansetron (ZOFRAN) tablet 4 mg  4 mg Oral Q6H PRN Emokpae, Ejiroghene E, MD       Or   ondansetron (ZOFRAN) injection 4 mg  4 mg Intravenous Q6H PRN Emokpae, Ejiroghene E, MD       polyethylene glycol (MIRALAX / GLYCOLAX) packet 17 g  17 g Oral Daily PRN Emokpae, Ejiroghene E, MD         Discharge Medications: Please see discharge summary for a list of discharge medications.  Relevant Imaging Results:  Relevant Lab Results:   Additional Information SSN: [id number]. PATIENT TESTED COVID+ ON 09/20/22  Nelsie Domino, Clydene Pugh, LCSW

## 2022-09-21 NOTE — Progress Notes (Signed)
Patient HR is staying between 115-125 at rest, Dr. Josephine Cables notified through secure chat.

## 2022-09-21 NOTE — Progress Notes (Signed)
Patient HR continues to be elevated between 115-135, Dr. Josephine Cables notified through secure chat. Will continue to monitor.

## 2022-09-21 NOTE — Evaluation (Signed)
Physical Therapy Evaluation Patient Details Name: [name] MRN: [id number] DOB: [birth year] Today's Date: 09/21/2022  History of Present Illness  [name] is a 88 y.o. female with medical history significant for diabetes mellitus, atrial fibrillation on chronic anticoagulation, hypertension, CKD 3, gout.  Patient was brought to the ED from home by EMS with reports of weakness and confusion.  Patient's daughter- [name] is at bedside and provides the history.  Patient lives at home, but family is with patient's 24 hours a day.  With time of my evaluation, patient is awake oriented to person only, she is unable to answer questions or give details.    Patient's daughter reports that patient weakness started on Monday-4 days ago, she updated her COVID shot that day.  Since then she has progressively gotten more weak, that she was unable to get out of bed and confused.  She could not follow directions, and yesterday she was saying "gibberish".  She reports patient is a high fall risk.  She ambulates with a walker.  She has a mild chronic cough at night which is unchanged, no difficulty breathing.  No fever no chills no vomiting no loose stools.   Clinical Impression  Patient demonstrates slow labored movement for sitting up at bedside with limited use of BUE due to weakness, very unsteady on feet, at high risk for falls and limited to a few side steps requiring tactile cueing/Mod/max assist to advance BLE.  Patient tolerated sitting up in chair after therapy with her daughter present in room.  Patient will benefit from continued skilled physical therapy in hospital and recommended venue below to increase strength, balance, endurance for safe ADLs and gait.          Recommendations for follow up therapy are one component of a multi-disciplinary discharge planning process, led by the attending physician.  Recommendations may be updated based on patient status, additional functional criteria and insurance  authorization.  Follow Up Recommendations Skilled nursing-short term rehab (<3 hours/day) Can patient physically be transported by private vehicle: No    Assistance Recommended at Discharge Intermittent Supervision/Assistance  Patient can return home with the following  A lot of help with bathing/dressing/bathroom;A lot of help with walking and/or transfers;Help with stairs or ramp for entrance;Assistance with cooking/housework    Equipment Recommendations None recommended by PT  Recommendations for Other Services       Functional Status Assessment Patient has had a recent decline in their functional status and demonstrates the ability to make significant improvements in function in a reasonable and predictable amount of time.     Precautions / Restrictions Precautions Precautions: Fall Restrictions Weight Bearing Restrictions: No      Mobility  Bed Mobility Overal bed mobility: Needs Assistance Bed Mobility: Sit to Supine       Sit to supine: Mod assist, Max assist   General bed mobility comments: increased time, labored movement with limited use of BUE due to weakness    Transfers Overall transfer level: Needs assistance Equipment used: Rolling walker (2 wheels) Transfers: Sit to/from Stand, Bed to chair/wheelchair/BSC Sit to Stand: Mod assist   Step pivot transfers: Mod assist       General transfer comment: unsteady on feet with diffuclty maintaining standing balance due to weakness    Ambulation/Gait Ambulation/Gait assistance: Mod assist, Max assist Gait Distance (Feet): 4 Feet Assistive device: Rolling walker (2 wheels) Gait Pattern/deviations: Decreased step length - right, Decreased step length - left, Decreased stride length, Narrow base of  support Gait velocity: decreased     General Gait Details: limited to a few slow labored unsteady side steps requiring tactile cueing move BLE  Stairs            Wheelchair Mobility    Modified Rankin  (Stroke Patients Only)       Balance Overall balance assessment: Needs assistance Sitting-balance support: Feet supported, No upper extremity supported Sitting balance-Leahy Scale: Poor Sitting balance - Comments: fair/poor seated at EOB Postural control: Right lateral lean, Posterior lean Standing balance support: Reliant on assistive device for balance, During functional activity, Bilateral upper extremity supported Standing balance-Leahy Scale: Poor Standing balance comment: using RW                             Pertinent Vitals/Pain Pain Assessment Pain Assessment: No/denies pain    Home Living Family/patient expects to be discharged to:: Private residence Living Arrangements: Children Available Help at Discharge: Family;Available 24 hours/day Type of Home: House Home Access: Ramped entrance       Home Layout: One level Home Equipment: Conservation officer, nature (2 wheels);Cane - single point;Transport chair      Prior Function Prior Level of Function : Needs assist       Physical Assist : Mobility (physical);ADLs (physical) Mobility (physical): Bed mobility;Transfers;Gait;Stairs   Mobility Comments: household ambulator using RW ADLs Comments: assisted by family     Hand Dominance        Extremity/Trunk Assessment   Upper Extremity Assessment Upper Extremity Assessment: Generalized weakness    Lower Extremity Assessment Lower Extremity Assessment: Generalized weakness    Cervical / Trunk Assessment Cervical / Trunk Assessment: Kyphotic  Communication   Communication: HOH  Cognition Arousal/Alertness: Awake/alert, Lethargic Behavior During Therapy: WFL for tasks assessed/performed Overall Cognitive Status: Within Functional Limits for tasks assessed                                 General Comments: requires frequent repeated verbal/tactile cueing        General Comments      Exercises     Assessment/Plan    PT Assessment  Patient needs continued PT services  PT Problem List Decreased strength;Decreased activity tolerance;Decreased balance;Decreased mobility       PT Treatment Interventions DME instruction;Gait training;Stair training;Functional mobility training;Therapeutic activities;Therapeutic exercise;Patient/family education;Balance training    PT Goals (Current goals can be found in the Care Plan section)  Acute Rehab PT Goals Patient Stated Goal: return home after rehab PT Goal Formulation: With patient/family Time For Goal Achievement: 10/05/22 Potential to Achieve Goals: Good    Frequency Min 3X/week     Co-evaluation               AM-PAC PT "6 Clicks" Mobility  Outcome Measure Help needed turning from your back to your side while in a flat bed without using bedrails?: A Lot Help needed moving from lying on your back to sitting on the side of a flat bed without using bedrails?: A Lot Help needed moving to and from a bed to a chair (including a wheelchair)?: A Lot Help needed standing up from a chair using your arms (e.g., wheelchair or bedside chair)?: A Lot Help needed to walk in hospital room?: A Lot Help needed climbing 3-5 steps with a railing? : Total 6 Click Score: 11    End of Session   Activity Tolerance: Patient  tolerated treatment well;Patient limited by fatigue Patient left: in chair;with call bell/phone within reach;with chair alarm set;with family/visitor present Nurse Communication: Mobility status PT Visit Diagnosis: Unsteadiness on feet (R26.81);Other abnormalities of gait and mobility (R26.89);Muscle weakness (generalized) (M62.81)    Time: 1120-1150 PT Time Calculation (min) (ACUTE ONLY): 30 min   Charges:   PT Evaluation $PT Eval Moderate Complexity: 1 Mod PT Treatments $Therapeutic Activity: 23-37 mins        12:07 PM, 09/21/22 Lonell Grandchild, MPT Physical Therapist with [facility] [phone] office [phone] mobile phone

## 2022-09-21 NOTE — Progress Notes (Signed)
   09/21/22 0053  Assess: MEWS Score  Resp (!) 27  Assess: MEWS Score  MEWS Temp 0  MEWS Systolic 0  MEWS Pulse 0  MEWS RR 2  MEWS LOC 0  MEWS Score 2  MEWS Score Color Yellow  Assess: if the MEWS score is Yellow or Red  Were vital signs taken at a resting state? Yes  Focused Assessment No change from prior assessment  Does the patient meet 2 or more of the SIRS criteria? Yes  Does the patient have a confirmed or suspected source of infection? Yes  Provider and Rapid Response Notified? No  MEWS guidelines implemented *See Row Information* No, vital signs rechecked  Treat  MEWS Interventions Other (Comment) (elevated head of bed, rechecked RR)  Pain Scale 0-10  Pain Score 0  Take Vital Signs  Increase Vital Sign Frequency  Yellow: Q 2hr X 2 then Q 4hr X 2, if remains yellow, continue Q 4hrs  Escalate  MEWS: Escalate Yellow: discuss with charge nurse/RN and consider discussing with provider and RRT  Notify: Charge Nurse/RN  Name of Charge Nurse/RN Notified Kristi  Date Charge Nurse/RN Notified 09/21/22  Time Charge Nurse/RN Notified 0115  Assess: SIRS CRITERIA  SIRS Temperature  0  SIRS Pulse 0  SIRS Respirations  1  SIRS WBC 1  SIRS Score Sum  2

## 2022-09-21 NOTE — Progress Notes (Signed)
PROGRESS NOTE     [name], is a 88 y.o. female, DOB - 1927/07/13, [id number]  Admit date - 09/20/2022   Admitting Physician Bethena Roys, MD  Outpatient Primary MD for the patient is Sasser, Silvestre Moment, MD  LOS - 1  Chief Complaint  Patient presents with   Weakness   Altered Mental Status        Brief Narrative:  88 y.o. female with medical history significant for diabetes mellitus, atrial fibrillation on chronic anticoagulation, hypertension, CKD 3, gout admitted with acute respiratory concerns in setting of underlying advanced dementia with sundowning, UTI and superimposed COVID-19 respiratory infection    -Assessment and Plan: * Acute metabolic encephalopathy Confusion.  Head CT shows stable chronic findings.  Encephalopathy likely secondary to UTI and COVID-19 infection.  -Treat for dehydration, UTI COVID-19 infection  COVID-19 virus infection COVID-19 infection.  -Previously vaccinated against COVID. -Got a new COVID booster on 09/17/2022 before she found that she was already infected -- chest x-ray showing bibasilar atelectasis.  -  C/n Molnupiravir, as she is on anticoagulation with Eliquis.  Atrial fibrillation (HCC) Rate controlled and on anticoagulation. -Resume atenolol and Eliquis  UTI (urinary tract infection) -UA from 09/20/2022 with gram-negative rods final ID and sensitivity pending -IV ceftriaxone 1 g daily    Hyponatremia  Chronic hyponatremia over the past 2 months sodium has ranged from 120-130. -Gentle hydration orally and IV  Type 2 diabetes mellitus (HCC) Hold metformin - SSI- S  Generalized weakness Likely secondary to COVID infection and UTI.  Daughter reports of high fall risk. -PT eval approach recommends SNF rehab  Disposition/Need for in-Hospital Stay- patient unable to be discharged at this time due to GNR UTI,, COVID-19 infection and generalized weakness and fall risk--requiring IV antibiotics gentle hydration and may  require SNF rehab  Status is: Inpatient   Disposition: The patient is from: Home              Anticipated d/c is to: Home with HH Vs SNF              Anticipated d/c date is: 2 days              Patient currently is not medically stable to d/c. Barriers: Not Clinically Stable-   Code Status :  -  Code Status: DNR   Family Communication:    Discussed with patient's daughter at bedside  DVT Prophylaxis  :   - SCDs    apixaban (ELIQUIS) tablet 5 mg   Lab Results  Component Value Date   PLT 266 09/21/2022    Inpatient Medications  Scheduled Meds:  apixaban  5 mg Oral BID   atenolol  50 mg Oral BID   insulin aspart  0-5 Units Subcutaneous QHS   insulin aspart  0-9 Units Subcutaneous TID WC   molnupiravir EUA  4 capsule Oral BID   Continuous Infusions:  sodium chloride 75 mL/hr at 09/21/22 1522   cefTRIAXone (ROCEPHIN)  IV 1 g (09/21/22 1734)   PRN Meds:.acetaminophen, ondansetron **OR** ondansetron (ZOFRAN) IV, polyethylene glycol   Anti-infectives (From admission, onward)    Start     Dose/Rate Route Frequency Ordered Stop   09/21/22 1800  cefTRIAXone (ROCEPHIN) 1 g in sodium chloride 0.9 % 100 mL IVPB        1 g 200 mL/hr over 30 Minutes Intravenous Every 24 hours 09/20/22 2207     09/20/22 2300  molnupiravir EUA (LAGEVRIO) capsule 800 mg  4 capsule Oral 2 times daily 09/20/22 2207 09/25/22 2159   09/20/22 1715  cefTRIAXone (ROCEPHIN) 1 g in sodium chloride 0.9 % 100 mL IVPB        1 g 200 mL/hr over 30 Minutes Intravenous  Once 09/20/22 1713 09/20/22 2019         Subjective: [name] today has no fevers, no emesis,  No chest pain,   - Fatigue persist, appetite is poor -Daughter at bedside, questions answered   Objective: Vitals:   09/21/22 0515 09/21/22 0520 09/21/22 0834 09/21/22 1113  BP: (!) 142/73 136/78 128/62 116/70  Pulse: (!) 108 (!) 116 (!) 114 84  Resp: (!) 30 (!) 24  16  Temp: 98.4 F (36.9 C) 98.4 F (36.9 C)  97.8 F (36.6 C)   TempSrc: Oral Oral  Oral  SpO2: 98% 97%  99%  Weight:      Height:        Intake/Output Summary (Last 24 hours) at 09/21/2022 1827 Last data filed at 09/21/2022 1700 Gross per 24 hour  Intake 1770.75 ml  Output 300 ml  Net 1470.75 ml   Filed Weights   09/20/22 1448 09/20/22 2134  Weight: 66 kg 61.6 kg    Physical Exam  Gen:-Frail, elderly and chronically ill-appearing HEENT:- [place].AT, No sclera icterus Neck-Supple Neck,No JVD,.  Lungs-somewhat diminished breath sounds without wheezing  CV- S1, S2 normal, regular  Abd-  +ve B.Sounds, Abd Soft, No tenderness,    Extremity/Skin:- No  edema, pedal pulses present  Psych-affect is appropriate, oriented x3 Neuro-generalized weakness, no new focal deficits, no tremors  Data Reviewed: I have personally reviewed following labs and imaging studies  CBC: Recent Labs  Lab 09/20/22 1452 09/21/22 0431  WBC 7.4 4.4  NEUTROABS  --  3.4  HGB 12.3 11.1*  HCT 37.5 33.1*  MCV 93.1 90.9  PLT 286 537   Basic Metabolic Panel: Recent Labs  Lab 09/20/22 1723 09/21/22 0431  NA 130* 129*  K 4.2 4.2  CL 96* 97*  CO2 23 21*  GLUCOSE 145* 157*  BUN 17 20  CREATININE 0.69 0.73  CALCIUM 8.8* 8.8*  MG  --  1.3*  PHOS  --  2.9   GFR: Estimated Creatinine Clearance: 36 mL/min (by C-G formula based on SCr of 0.73 mg/dL). Liver Function Tests: Recent Labs  Lab 09/20/22 1723 09/21/22 0431  AST 20 14*  ALT 10 9  ALKPHOS 37* 40  BILITOT 1.2 1.3*  PROT 6.5 6.4*  ALBUMIN 3.3* 3.2*   Recent Results (from the past 240 hour(s))  Urine Culture     Status: Abnormal (Preliminary result)   Collection Time: 09/20/22  2:52 PM   Specimen: Urine, Clean Catch  Result Value Ref Range Status   Specimen Description   Final    URINE, CLEAN CATCH Performed at [facility], [address], [place], [place] [address]    Special Requests   Final    NONE Performed at [facility], [address], [place], [place] [address]    Culture (A)  Final     >=100,000 COLONIES/mL GRAM NEGATIVE RODS IDENTIFICATION AND SUSCEPTIBILITIES TO FOLLOW Performed at [facility], [place] [address] [place],  [address]    Report Status PENDING  Incomplete  SARS Coronavirus 2 by RT PCR (hospital order, performed in [facility] hospital lab) *cepheid single result test* Anterior Nasal Swab     Status: Abnormal   Collection Time: 09/20/22  3:40 PM   Specimen: Anterior  Nasal Swab  Result Value Ref Range Status   SARS Coronavirus 2 by RT PCR POSITIVE (A) NEGATIVE Final    Comment: (NOTE) SARS-CoV-2 target nucleic acids are DETECTED  SARS-CoV-2 RNA is generally detectable in upper respiratory specimens  during the acute phase of infection.  Positive results are indicative  of the presence of the identified virus, but do not rule out bacterial infection or co-infection with other pathogens not detected by the test.  Clinical correlation with patient history and  other diagnostic information is necessary to determine patient infection status.  The expected result is negative.  Fact Sheet for Patients:   https://www.patel.info/   Fact Sheet for Healthcare Providers:   https://hall.com/    This test is not yet approved or cleared by the [place] FDA and  has been authorized for detection and/or diagnosis of SARS-CoV-2 by FDA under an Emergency Use Authorization (EUA).  This EUA will remain in effect (meaning this test can be used) for the duration of  the COVID-19 declaration under Section 564(b)(1)  of the Act, 21 U.S.C. section 360-bbb-3(b)(1), unless the authorization is terminated or revoked sooner.   Performed at [facility], [address], [place], [place] [address]     Radiology Studies: DG Chest Portable 1 View  Result Date: 09/20/2022 CLINICAL DATA:  Weakness EXAM: PORTABLE CHEST 1 VIEW COMPARISON:  08/18/2022 FINDINGS: Stable cardiomegaly. Aortic atherosclerosis. Very low lung  volumes. Mild bibasilar atelectasis. No pleural effusion or pneumothorax. IMPRESSION: Very low lung volumes with mild bibasilar atelectasis. Electronically Signed   By: Davina Poke D.O.   On: 09/20/2022 15:56   CT Head Wo Contrast  Result Date: 09/20/2022 CLINICAL DATA:  Altered mental status. EXAM: CT HEAD WITHOUT CONTRAST TECHNIQUE: Contiguous axial images were obtained from the base of the skull through the vertex without intravenous contrast. RADIATION DOSE REDUCTION: This exam was performed according to the departmental dose-optimization program which includes automated exposure control, adjustment of the mA and/or kV according to patient size and/or use of iterative reconstruction technique. COMPARISON:  07/06/2022 FINDINGS: Brain: Stable age related cerebral atrophy, ventriculomegaly and periventricular white matter disease. No extra-axial fluid collections are identified. No CT findings for acute hemispheric infarction or intracranial hemorrhage. No mass lesions. The brainstem and cerebellum are normal. Vascular: Stable advanced vascular calcifications but no aneurysm or hyperdense vessels. Skull: No skull fracture or bone lesions. Sinuses/Orbits: The paranasal sinuses and mastoid air cells are clear. The globes are intact. Other: No scalp lesions or scalp hematoma. IMPRESSION: 1. Stable age related cerebral atrophy, ventriculomegaly and periventricular white matter disease. 2. No acute intracranial findings or mass lesions. Electronically Signed   By: Marijo Sanes M.D.   On: 09/20/2022 15:45     Scheduled Meds:  apixaban  5 mg Oral BID   atenolol  50 mg Oral BID   insulin aspart  0-5 Units Subcutaneous QHS   insulin aspart  0-9 Units Subcutaneous TID WC   molnupiravir EUA  4 capsule Oral BID   Continuous Infusions:  sodium chloride 75 mL/hr at 09/21/22 1522   cefTRIAXone (ROCEPHIN)  IV 1 g (09/21/22 1734)     LOS: 1 day    Roxan Hockey M.D on 09/21/2022 at 6:27 PM  Go to  [email or web address] - for contact info  [facility] - Office  [phone]  If 7PM-7AM, please contact night-coverage [email or web address] 09/21/2022, 6:27 PM

## 2022-09-21 NOTE — Progress Notes (Addendum)
   09/21/22 0123  Assess: MEWS Score  Temp 99.4 F (37.4 C)  BP 128/72  Pulse Rate 82  Resp (!) 28  Level of Consciousness Alert  SpO2 95 %  O2 Device Room Air  Assess: MEWS Score  MEWS Temp 0  MEWS Systolic 0  MEWS Pulse 0  MEWS RR 2  MEWS LOC 0  MEWS Score 2  MEWS Score Color Yellow  Assess: if the MEWS score is Yellow or Red  Were vital signs taken at a resting state? Yes  Focused Assessment No change from prior assessment  Does the patient meet 2 or more of the SIRS criteria? Yes  Does the patient have a confirmed or suspected source of infection? Yes  Provider and Rapid Response Notified? No  MEWS guidelines implemented *See Row Information* No, previously yellow, continue vital signs every 4 hours  Treat  MEWS Interventions Administered prn meds/treatments  Pain Scale 0-10  Pain Score 0  Take Vital Signs  Increase Vital Sign Frequency  Yellow: Q 2hr X 2 then Q 4hr X 2, if remains yellow, continue Q 4hrs  Escalate  MEWS: Escalate Yellow: discuss with charge nurse/RN and consider discussing with provider and RRT  Notify: Charge Nurse/RN  Name of Charge Nurse/RN Notified Christy, RN  Date Charge Nurse/RN Notified 09/21/22  Time Charge Nurse/RN Notified 0126  Assess: SIRS CRITERIA  SIRS Temperature  0  SIRS Pulse 0  SIRS Respirations  1  SIRS WBC 1  SIRS Score Sum  2   Secure chat sent to Dr. Pierce Crane to notify that patient's urine is noticeably more cloudy and for telemetry order due to increased respirations and history of Afib. Patient placed on 2L O2 via nasal canula. Will continue to monitor.

## 2022-09-21 NOTE — Progress Notes (Signed)
Patient admitted to floor, daughter at bedside. Daughter verbally expressed concern for patient falling. Education given to patient's daughter, fall mat in place at bedside, fall bracelet applied, bed alarms on and room close to nurse station. Will continue to monitor. Patient resting well at this time.

## 2022-09-21 NOTE — Plan of Care (Signed)
  Problem: Acute Rehab PT Goals(only PT should resolve) Goal: Pt Will Go Supine/Side To Sit Outcome: Progressing Flowsheets (Taken 09/21/2022 1208) Pt will go Supine/Side to Sit:  with minimal assist  with moderate assist Goal: Patient Will Transfer Sit To/From Stand Outcome: Progressing Flowsheets (Taken 09/21/2022 1208) Patient will transfer sit to/from stand:  with minimal assist  with moderate assist Goal: Pt Will Transfer Bed To Chair/Chair To Bed Outcome: Progressing Flowsheets (Taken 09/21/2022 1208) Pt will Transfer Bed to Chair/Chair to Bed:  with min assist  with mod assist Goal: Pt Will Ambulate Outcome: Progressing Flowsheets (Taken 09/21/2022 1208) Pt will Ambulate:  15 feet  with moderate assist  with rolling walker   12:09 PM, 09/21/22 Lonell Grandchild, MPT Physical Therapist with [facility] [phone] office [phone] mobile phone

## 2022-09-22 DIAGNOSIS — G9341 Metabolic encephalopathy: Secondary | ICD-10-CM | POA: Diagnosis not present

## 2022-09-22 LAB — CBC WITH DIFFERENTIAL/PLATELET
Abs Immature Granulocytes: 0.2 10*3/uL — ABNORMAL HIGH (ref 0.00–0.07)
Band Neutrophils: 11 %
Basophils Absolute: 0 10*3/uL (ref 0.0–0.1)
Basophils Relative: 0 %
Eosinophils Absolute: 0 10*3/uL (ref 0.0–0.5)
Eosinophils Relative: 0 %
HCT: 32.4 % — ABNORMAL LOW (ref 36.0–46.0)
Hemoglobin: 10.8 g/dL — ABNORMAL LOW (ref 12.0–15.0)
Lymphocytes Relative: 5 %
Lymphs Abs: 0.3 10*3/uL — ABNORMAL LOW (ref 0.7–4.0)
MCH: 30.3 pg (ref 26.0–34.0)
MCHC: 33.3 g/dL (ref 30.0–36.0)
MCV: 91 fL (ref 80.0–100.0)
Metamyelocytes Relative: 3 %
Monocytes Absolute: 0.5 10*3/uL (ref 0.1–1.0)
Monocytes Relative: 8 %
Neutro Abs: 5 10*3/uL (ref 1.7–7.7)
Neutrophils Relative %: 73 %
Platelets: 287 10*3/uL (ref 150–400)
RBC: 3.56 MIL/uL — ABNORMAL LOW (ref 3.87–5.11)
RDW: 14.9 % (ref 11.5–15.5)
WBC: 6 10*3/uL (ref 4.0–10.5)
nRBC: 0 % (ref 0.0–0.2)

## 2022-09-22 LAB — GLUCOSE, CAPILLARY
Glucose-Capillary: 199 mg/dL — ABNORMAL HIGH (ref 70–99)
Glucose-Capillary: 211 mg/dL — ABNORMAL HIGH (ref 70–99)
Glucose-Capillary: 172 mg/dL — ABNORMAL HIGH (ref 70–99)
Glucose-Capillary: 95 mg/dL (ref 70–99)

## 2022-09-22 LAB — COMPREHENSIVE METABOLIC PANEL
ALT: 11 U/L (ref 0–44)
AST: 16 U/L (ref 15–41)
Albumin: 3 g/dL — ABNORMAL LOW (ref 3.5–5.0)
Alkaline Phosphatase: 49 U/L (ref 38–126)
Anion gap: 11 (ref 5–15)
BUN: 24 mg/dL — ABNORMAL HIGH (ref 8–23)
CO2: 18 mmol/L — ABNORMAL LOW (ref 22–32)
Calcium: 8.6 mg/dL — ABNORMAL LOW (ref 8.9–10.3)
Chloride: 97 mmol/L — ABNORMAL LOW (ref 98–111)
Creatinine, Ser: 0.73 mg/dL (ref 0.44–1.00)
GFR, Estimated: >60 mL/min (ref >60)
Glucose, Bld: 214 mg/dL — ABNORMAL HIGH (ref 70–99)
Potassium: 4 mmol/L (ref 3.5–5.1)
Sodium: 126 mmol/L — ABNORMAL LOW (ref 135–145)
Total Bilirubin: 1.2 mg/dL (ref 0.3–1.2)
Total Protein: 6.5 g/dL (ref 6.5–8.1)

## 2022-09-22 LAB — URINE CULTURE: Culture: >=100000 — AB

## 2022-09-22 LAB — FERRITIN: Ferritin: 149 ng/mL (ref 11–307)

## 2022-09-22 LAB — C-REACTIVE PROTEIN: CRP: 24.3 mg/dL — ABNORMAL HIGH (ref <1.0)

## 2022-09-22 LAB — D-DIMER, QUANTITATIVE: D-Dimer, Quant: 1.64 ug/mL-FEU — ABNORMAL HIGH (ref 0.00–0.50)

## 2022-09-22 MED ORDER — CEFDINIR 300 MG PO CAPS
300.0000 mg | ORAL_CAPSULE | Freq: Two times a day (BID) | ORAL | Status: DC
  Administered 2022-09-22 – 2022-09-24 (×5): 300 mg via ORAL
  Filled 2022-09-22 (×5): qty 1

## 2022-09-22 MED ORDER — PREDNISONE 20 MG PO TABS
20.0000 mg | ORAL_TABLET | Freq: Every day | ORAL | Status: AC
  Administered 2022-09-22 – 2022-09-24 (×3): 20 mg via ORAL
  Filled 2022-09-22 (×3): qty 1

## 2022-09-22 MED ORDER — DILTIAZEM HCL 25 MG/5ML IV SOLN
5.0000 mg | Freq: Once | INTRAVENOUS | Status: AC
  Administered 2022-09-22: 5 mg via INTRAVENOUS
  Filled 2022-09-22: qty 5

## 2022-09-22 NOTE — Progress Notes (Signed)
Patient awake and alert with baseline confusion. Vital signs stable. Patient mobilized at highest level of mobility. Daughter in to visit and had several questions that were all answered. Safety maintained, call bell within reach, bed in lowest position, bed alarm on and functioning without any issues. Will continue to monitor and endorse.

## 2022-09-22 NOTE — Progress Notes (Signed)
   09/22/22 0021  Vitals  BP (!) 125/90  BP Location Left Arm  BP Method Automatic  Patient Position (if appropriate) Lying  Pulse Rate (!) 158  MEWS COLOR  MEWS Score Color Yellow  Oxygen Therapy  SpO2 97 %  O2 Device Nasal Cannula  O2 Flow Rate (L/min) 2 L/min  MEWS Score  MEWS Temp 0  MEWS Systolic 0  MEWS Pulse 3  MEWS RR 0  MEWS LOC 0  MEWS Score 3   Dr. Josephine Cables notified of HR. New order to increase NS ato 46ml/hr and obtain EKG

## 2022-09-22 NOTE — Progress Notes (Signed)
PROGRESS NOTE     [name], is a 88 y.o. female, DOB - [birth year], DEY:[id number]  Admit date - 09/20/2022   Admitting Physician Bethena Roys, MD  Outpatient Primary MD for the patient is Sasser, Silvestre Moment, MD  LOS - 2  Chief Complaint  Patient presents with   Weakness   Altered Mental Status        Brief Narrative:  88 y.o. female with medical history significant for diabetes mellitus, atrial fibrillation on chronic anticoagulation, hypertension, CKD 3, gout admitted with acute respiratory concerns in setting of underlying advanced dementia with sundowning, UTI and superimposed COVID-19 respiratory infection    -Assessment and Plan: 1)Acute metabolic encephalopathy Confusion.  Head CT shows stable chronic findings.  Encephalopathy likely secondary to UTI and COVID-19 infection.  -Treat for dehydration, UTI and COVID-19 infection 09/22/22 -Mentation improving overall with above treatment  COVID-19 virus infection with acute hypoxic respiratory failure COVID-19 infection.  -Previously vaccinated against COVID. -Got a new COVID booster on 09/17/2022 before she found that she was already infected -- chest x-ray showing bibasilar atelectasis.  -  C/n Molnupiravir, as she is on anticoagulation with Eliquis. 09/22/22 -Respiratory status improving with above measures -Weaned off oxygen on 09/22/2022  Paroxysmal atrial fibrillation (HCC) -Continue Eliquis for stroke prophylaxis -Continue atenolol for rate control  Klebsiella UTI (urinary tract infection) -Urine culture from 09/20/2022 with Klebsiella -Treated with IV ceftriaxone -Okay to transition to [facility] as of 09/22/2022   Hyponatremia  Chronic hyponatremia over the past 2 months sodium has ranged from 120-130. -Gentle hydration orally and IV  Type 2 diabetes mellitus (HCC) Hold metformin Use Novolog/Humalog Sliding scale insulin with Accu-Cheks/Fingersticks as ordered  Generalized weakness Likely secondary to  COVID infection and UTI.  Daughter reports of high fall risk. -PT eval appreciated, recommends SNF rehab  Disposition/Need for in-Hospital Stay- patient unable to be discharged at this time due to [place] UTI,, COVID-19 infection and generalized weakness and fall risk--- physical therapy recommends SNF rehab  Status is: Inpatient   Disposition: The patient is from: Home              Anticipated d/c is to: Home with HH Vs SNF              Anticipated d/c date is: 2 days              Patient currently is not medically stable to d/c. Barriers: Not Clinically Stable-   Code Status :  -  Code Status: DNR   Family Communication:    Discussed with patient's daughter at bedside  DVT Prophylaxis  :   - SCDs    apixaban (ELIQUIS) tablet 5 mg   Lab Results  Component Value Date   PLT 287 09/22/2022    Inpatient Medications  Scheduled Meds:  apixaban  5 mg Oral BID   atenolol  50 mg Oral BID   cefdinir  300 mg Oral Q12H   insulin aspart  0-5 Units Subcutaneous QHS   insulin aspart  0-9 Units Subcutaneous TID WC   molnupiravir EUA  4 capsule Oral BID   Continuous Infusions:  sodium chloride 75 mL/hr at 09/22/22 1707   PRN Meds:.acetaminophen, ondansetron **OR** ondansetron (ZOFRAN) IV, polyethylene glycol   Anti-infectives (From admission, onward)    Start     Dose/Rate Route Frequency Ordered Stop   09/22/22 1130  cefdinir (OMNICEF) capsule 300 mg        300 mg Oral Every 12 hours 09/22/22 1031  09/21/22 1800  cefTRIAXone (ROCEPHIN) 1 g in sodium chloride 0.9 % 100 mL IVPB  Status:  Discontinued        1 g 200 mL/hr over 30 Minutes Intravenous Every 24 hours 09/20/22 2207 09/22/22 1031   09/20/22 2300  molnupiravir EUA (LAGEVRIO) capsule 800 mg        4 capsule Oral 2 times daily 09/20/22 2207 09/25/22 2159   09/20/22 1715  cefTRIAXone (ROCEPHIN) 1 g in sodium chloride 0.9 % 100 mL IVPB        1 g 200 mL/hr over 30 Minutes Intravenous  Once 09/20/22 1713 09/20/22 2019          Subjective: Dyana Bouch today has no fevers, no emesis,  No chest pain,   - Patient status improving, weaned off oxygen -Cough is not as bad -Much less confused -Daughter at bedside, questions answered   Objective: Vitals:   09/22/22 0607 09/22/22 0820 09/22/22 1200 09/22/22 1449  BP: (!) 156/83 (!) 143/80 134/79 (!) 146/75  Pulse: (!) 119 (!) 111 (!) 105 94  Resp: 18 18 20 20   Temp: (!) 97.2 F (36.2 C) 97.9 F (36.6 C) 97.9 F (36.6 C) 98.2 F (36.8 C)  TempSrc:  Oral Oral Oral  SpO2: 97% 97% 97% 96%  Weight:      Height:        Intake/Output Summary (Last 24 hours) at 09/22/2022 1714 Last data filed at 09/22/2022 1707 Gross per 24 hour  Intake 1132.79 ml  Output --  Net 1132.79 ml   Filed Weights   09/20/22 1448 09/20/22 2134  Weight: 66 kg 61.6 kg    Physical Exam  Gen:-Frail, elderly and chronically ill-appearing HEENT:- [place].AT, No sclera icterus Neck-Supple Neck,No JVD,.  Lungs-improving air movement, no wheezing CV- S1, S2 normal, regular  Abd-  +ve B.Sounds, Abd Soft, No tenderness,    Extremity/Skin:- No  edema, pedal pulses present  Psych-affect is appropriate, oriented x3 Neuro-generalized weakness, no new focal deficits, no tremors  Data Reviewed: I have personally reviewed following labs and imaging studies  CBC: Recent Labs  Lab 09/20/22 1452 09/21/22 0431 09/22/22 0432  WBC 7.4 4.4 6.0  NEUTROABS  --  3.4 5.0  HGB 12.3 11.1* 10.8*  HCT 37.5 33.1* 32.4*  MCV 93.1 90.9 91.0  PLT 286 266 824   Basic Metabolic Panel: Recent Labs  Lab 09/20/22 1723 09/21/22 0431 09/22/22 0432  NA 130* 129* 126*  K 4.2 4.2 4.0  CL 96* 97* 97*  CO2 23 21* 18*  GLUCOSE 145* 157* 214*  BUN 17 20 24*  CREATININE 0.69 0.73 0.73  CALCIUM 8.8* 8.8* 8.6*  MG  --  1.3*  --   PHOS  --  2.9  --    GFR: Estimated Creatinine Clearance: 36 mL/min (by C-G formula based on SCr of 0.73 mg/dL). Liver Function Tests: Recent Labs  Lab 09/20/22 1723  09/21/22 0431 09/22/22 0432  AST 20 14* 16  ALT 10 9 11   ALKPHOS 37* 40 49  BILITOT 1.2 1.3* 1.2  PROT 6.5 6.4* 6.5  ALBUMIN 3.3* 3.2* 3.0*   Recent Results (from the past 240 hour(s))  Urine Culture     Status: Abnormal   Collection Time: 09/20/22  2:52 PM   Specimen: Urine, Clean Catch  Result Value Ref Range Status   Specimen Description   Final    URINE, CLEAN CATCH Performed at [facility], [address] [place], [place] [address]    Special Requests  Final    NONE Performed at [facility], [address], [place], [place] [address]    Culture >=100,000 COLONIES/mL KLEBSIELLA PNEUMONIAE (A)  Final   Report Status 09/22/2022 FINAL  Final   Organism ID, Bacteria KLEBSIELLA PNEUMONIAE (A)  Final      Susceptibility   Klebsiella pneumoniae - MIC*    AMPICILLIN >=32 RESISTANT Resistant     CEFAZOLIN <=4 SENSITIVE Sensitive     CEFEPIME <=0.12 SENSITIVE Sensitive     CEFTRIAXONE <=0.25 SENSITIVE Sensitive     CIPROFLOXACIN <=0.25 SENSITIVE Sensitive     GENTAMICIN <=1 SENSITIVE Sensitive     IMIPENEM <=0.25 SENSITIVE Sensitive     NITROFURANTOIN 64 INTERMEDIATE Intermediate     TRIMETH/SULFA <=20 SENSITIVE Sensitive     AMPICILLIN/SULBACTAM 4 SENSITIVE Sensitive     PIP/TAZO <=4 SENSITIVE Sensitive     * >=100,000 COLONIES/mL KLEBSIELLA PNEUMONIAE  SARS Coronavirus 2 by RT PCR (hospital order, performed in [place] hospital lab) *cepheid single result test* Anterior Nasal Swab     Status: Abnormal   Collection Time: 09/20/22  3:40 PM   Specimen: Anterior Nasal Swab  Result Value Ref Range Status   SARS Coronavirus 2 by RT PCR POSITIVE (A) NEGATIVE Final    Comment: (NOTE) SARS-CoV-2 target nucleic acids are DETECTED  SARS-CoV-2 RNA is generally detectable in upper respiratory specimens  during the acute phase of infection.  Positive results are indicative  of the presence of the identified virus, but do not rule out bacterial infection or co-infection with  other pathogens not detected by the test.  Clinical correlation with patient history and  other diagnostic information is necessary to determine patient infection status.  The expected result is negative.  Fact Sheet for Patients:   https://www.patel.info/   Fact Sheet for Healthcare Providers:   https://hall.com/    This test is not yet approved or cleared by the [place] FDA and  has been authorized for detection and/or diagnosis of SARS-CoV-2 by FDA under an Emergency Use Authorization (EUA).  This EUA will remain in effect (meaning this test can be used) for the duration of  the COVID-19 declaration under Section 564(b)(1)  of the Act, 21 U.S.C. section 360-bbb-3(b)(1), unless the authorization is terminated or revoked sooner.   Performed at [facility], [address], [place], [place] [address]     Radiology Studies: No results found.   Scheduled Meds:  apixaban  5 mg Oral BID   atenolol  50 mg Oral BID   cefdinir  300 mg Oral Q12H   insulin aspart  0-5 Units Subcutaneous QHS   insulin aspart  0-9 Units Subcutaneous TID WC   molnupiravir EUA  4 capsule Oral BID   Continuous Infusions:  sodium chloride 75 mL/hr at 09/22/22 1707     LOS: 2 days    Roxan Hockey M.D on 09/22/2022 at 5:14 PM  Go to [email or web address] - for contact info  [facility] - Office  [phone]  If 7PM-7AM, please contact night-coverage [email or web address] 09/22/2022, 5:14 PM

## 2022-09-23 DIAGNOSIS — G9341 Metabolic encephalopathy: Secondary | ICD-10-CM | POA: Diagnosis not present

## 2022-09-23 LAB — CBC WITH DIFFERENTIAL/PLATELET
Abs Immature Granulocytes: 0.05 10*3/uL (ref 0.00–0.07)
Basophils Absolute: 0 10*3/uL (ref 0.0–0.1)
Basophils Relative: 1 %
Eosinophils Absolute: 0 10*3/uL (ref 0.0–0.5)
Eosinophils Relative: 0 %
HCT: 33.7 % — ABNORMAL LOW (ref 36.0–46.0)
Hemoglobin: 11.1 g/dL — ABNORMAL LOW (ref 12.0–15.0)
Immature Granulocytes: 1 %
Lymphocytes Relative: 9 %
Lymphs Abs: 0.4 10*3/uL — ABNORMAL LOW (ref 0.7–4.0)
MCH: 30.2 pg (ref 26.0–34.0)
MCHC: 32.9 g/dL (ref 30.0–36.0)
MCV: 91.6 fL (ref 80.0–100.0)
Monocytes Absolute: 0.6 10*3/uL (ref 0.1–1.0)
Monocytes Relative: 12 %
Neutro Abs: 3.8 10*3/uL (ref 1.7–7.7)
Neutrophils Relative %: 77 %
Platelets: 283 10*3/uL (ref 150–400)
RBC: 3.68 MIL/uL — ABNORMAL LOW (ref 3.87–5.11)
RDW: 15.1 % (ref 11.5–15.5)
WBC: 4.9 10*3/uL (ref 4.0–10.5)
nRBC: 0 % (ref 0.0–0.2)

## 2022-09-23 LAB — GLUCOSE, CAPILLARY
Glucose-Capillary: 222 mg/dL — ABNORMAL HIGH (ref 70–99)
Glucose-Capillary: 171 mg/dL — ABNORMAL HIGH (ref 70–99)
Glucose-Capillary: 224 mg/dL — ABNORMAL HIGH (ref 70–99)

## 2022-09-23 LAB — COMPREHENSIVE METABOLIC PANEL
ALT: 38 U/L (ref 0–44)
AST: 76 U/L — ABNORMAL HIGH (ref 15–41)
Albumin: 2.9 g/dL — ABNORMAL LOW (ref 3.5–5.0)
Alkaline Phosphatase: 59 U/L (ref 38–126)
Anion gap: 8 (ref 5–15)
BUN: 20 mg/dL (ref 8–23)
CO2: 22 mmol/L (ref 22–32)
Calcium: 8.9 mg/dL (ref 8.9–10.3)
Chloride: 101 mmol/L (ref 98–111)
Creatinine, Ser: 0.6 mg/dL (ref 0.44–1.00)
GFR, Estimated: >60 mL/min (ref >60)
Glucose, Bld: 196 mg/dL — ABNORMAL HIGH (ref 70–99)
Potassium: 4.4 mmol/L (ref 3.5–5.1)
Sodium: 131 mmol/L — ABNORMAL LOW (ref 135–145)
Total Bilirubin: 0.8 mg/dL (ref 0.3–1.2)
Total Protein: 6.7 g/dL (ref 6.5–8.1)

## 2022-09-23 LAB — C-REACTIVE PROTEIN: CRP: 19.5 mg/dL — ABNORMAL HIGH (ref <1.0)

## 2022-09-23 LAB — D-DIMER, QUANTITATIVE: D-Dimer, Quant: 1.67 ug/mL-FEU — ABNORMAL HIGH (ref 0.00–0.50)

## 2022-09-23 LAB — FERRITIN: Ferritin: 238 ng/mL (ref 11–307)

## 2022-09-23 MED ORDER — ATENOLOL 25 MG PO TABS
75.0000 mg | ORAL_TABLET | Freq: Two times a day (BID) | ORAL | Status: DC
  Administered 2022-09-23 – 2022-09-24 (×2): 75 mg via ORAL
  Filled 2022-09-23 (×2): qty 3

## 2022-09-23 NOTE — Progress Notes (Signed)
Pt alert and oriented to self and place only. Pt up in recliner most of day. Ambulated fair with standby assist and FWW, good leg strength with standing and sitting but unsteady with walking. Pt has denied any c/o pain or SOB today. Ate > 75% of each meal, fed self after tray setup. Early this am, pt's urine was very cloudy, almost milky looking with lots of mucous noted. As day progressed, urine more yellow and clearer. Pt had episode of afib with RVR with heartrate in 150's but pt asymptomatic. MD Courage notified and orders received for increased dose of med at bedtime. [name] updated on pt condition, states understanding.

## 2022-09-23 NOTE — Progress Notes (Signed)
Pt rested well throughout night. No concerning issues at this time .

## 2022-09-23 NOTE — Progress Notes (Signed)
Received call from central telemetry stating pt's HR afib RVR 150's. Arrived to pt's room to find pt sitting up in chair, eating her supper meal. Tele monitor shows afib at 122/min. Pt denies any SOB or chest pain/pressure.  Chest clear to auscultation, no cough noted. Skin warm and dry. Pt alert, oriented to person and place. MD Courage notified of above info and current pt condition.

## 2022-09-23 NOTE — Progress Notes (Signed)
PROGRESS NOTE     [name], is a 88 y.o. female, DOB - [birth year], FGH:[id number]  Admit date - 09/20/2022   Admitting Physician Bethena Roys, MD  Outpatient Primary MD for the patient is Sasser, Silvestre Moment, MD  LOS - 3  Chief Complaint  Patient presents with   Weakness   Altered Mental Status        Brief Narrative:  88 y.o. female with medical history significant for diabetes mellitus, atrial fibrillation on chronic anticoagulation, hypertension, CKD 3, gout admitted with acute respiratory concerns in setting of underlying advanced dementia with sundowning, UTI and superimposed COVID-19 respiratory infection   -Assessment and Plan: 1)Acute metabolic encephalopathy Confusion.  Head CT shows stable chronic findings.  Encephalopathy likely secondary to UTI and COVID-19 infection.  -Treat for dehydration, UTI and COVID-19 infection 09/23/22 -Mentation has improved significantly with above treatment  COVID-19 virus infection with acute hypoxic respiratory failure COVID-19 infection.  -Previously vaccinated against COVID. -Got a new COVID booster on 09/17/2022 before she found that she was already infected -- chest x-ray showing bibasilar atelectasis.  -  C/n Molnupiravir, as she is on anticoagulation with Eliquis. 09/23/22 -Respiratory status improving with above measures -Weaned off oxygen on 09/22/2022  Paroxysmal atrial fibrillation (HCC) -Continue Eliquis for stroke prophylaxis -Continue atenolol for rate control  Klebsiella UTI (urinary tract infection) -Urine culture from 09/20/2022 with Klebsiella -Treated with IV ceftriaxone -Okay to complete Omnicef started on 09/22/2022   Hyponatremia  Chronic hyponatremia over the past 2 months sodium has ranged from 120-130. -Gentle hydration orally and IV  Type 2 diabetes mellitus (HCC) Hold metformin Use Novolog/Humalog Sliding scale insulin with Accu-Cheks/Fingersticks as ordered  Generalized weakness Likely  secondary to COVID infection and UTI.  Daughter reports of high fall risk. -PT eval appreciated, recommends SNF rehab  Disposition/Need for in-Hospital Stay- patient unable to be discharged at this time due to [place] UTI,, COVID-19 infection and generalized weakness and fall risk--- physical therapy recommends SNF rehab  Status is: Inpatient   Disposition: The patient is from: Home              Anticipated d/c is to: Home with HH Vs SNF              Anticipated d/c date is: 2 days              Patient currently is not medically stable to d/c. Barriers: Not Clinically Stable-   Code Status :  -  Code Status: DNR   Family Communication:    Discussed with patient's daughter at bedside  DVT Prophylaxis  :   - SCDs    apixaban (ELIQUIS) tablet 5 mg   Lab Results  Component Value Date   PLT 283 09/23/2022   Inpatient Medications  Scheduled Meds:  apixaban  5 mg Oral BID   atenolol  50 mg Oral BID   cefdinir  300 mg Oral Q12H   insulin aspart  0-5 Units Subcutaneous QHS   insulin aspart  0-9 Units Subcutaneous TID WC   molnupiravir EUA  4 capsule Oral BID   predniSONE  20 mg Oral Q breakfast   Continuous Infusions:  PRN Meds:.acetaminophen, ondansetron **OR** ondansetron (ZOFRAN) IV, polyethylene glycol   Anti-infectives (From admission, onward)    Start     Dose/Rate Route Frequency Ordered Stop   09/22/22 1130  cefdinir (OMNICEF) capsule 300 mg        300 mg Oral Every 12 hours 09/22/22 1031  09/21/22 1800  cefTRIAXone (ROCEPHIN) 1 g in sodium chloride 0.9 % 100 mL IVPB  Status:  Discontinued        1 g 200 mL/hr over 30 Minutes Intravenous Every 24 hours 09/20/22 2207 09/22/22 1031   09/20/22 2300  molnupiravir EUA (LAGEVRIO) capsule 800 mg        4 capsule Oral 2 times daily 09/20/22 2207 09/25/22 2159   09/20/22 1715  cefTRIAXone (ROCEPHIN) 1 g in sodium chloride 0.9 % 100 mL IVPB        1 g 200 mL/hr over 30 Minutes Intravenous  Once 09/20/22 1713 09/20/22 2019        Subjective: [name] today has no fevers, no emesis,  No chest pain,   - Cough and dyspnea improving --fatigue and generalized weakness persist -Ambulatory dysfunction concerns persist  Objective: Vitals:   09/22/22 1449 09/22/22 2016 09/23/22 0501 09/23/22 1001  BP: (!) 146/75 134/81 129/79 (!) 160/63  Pulse: 94 100 95 89  Resp: 20 20 20 18   Temp: 98.2 F (36.8 C) 98 F (36.7 C) 98.2 F (36.8 C)   TempSrc: Oral Oral Oral   SpO2: 96% 97% 99% 100%  Weight:      Height:        Intake/Output Summary (Last 24 hours) at 09/23/2022 1601 Last data filed at 09/23/2022 1300 Gross per 24 hour  Intake 2818.97 ml  Output 600 ml  Net 2218.97 ml   Filed Weights   09/20/22 1448 09/20/22 2134  Weight: 66 kg 61.6 kg   Physical Exam  Gen:-Frail, elderly and chronically ill-appearing HEENT:- [place].AT, No sclera icterus Neck-Supple Neck,No JVD,.  Lungs-improving air movement, no wheezing CV- S1, S2 normal, regular  Abd-  +ve B.Sounds, Abd Soft, No tenderness,    Extremity/Skin:- No  edema, pedal pulses present  Psych-affect is more appropriate, a lot more oriented, cooperative  Neuro-generalized weakness, no new focal deficits, no tremors  Data Reviewed: I have personally reviewed following labs and imaging studies  CBC: Recent Labs  Lab 09/20/22 1452 09/21/22 0431 09/22/22 0432 09/23/22 0415  WBC 7.4 4.4 6.0 4.9  NEUTROABS  --  3.4 5.0 3.8  HGB 12.3 11.1* 10.8* 11.1*  HCT 37.5 33.1* 32.4* 33.7*  MCV 93.1 90.9 91.0 91.6  PLT 286 266 287 659   Basic Metabolic Panel: Recent Labs  Lab 09/20/22 1723 09/21/22 0431 09/22/22 0432 09/23/22 0415  NA 130* 129* 126* 131*  K 4.2 4.2 4.0 4.4  CL 96* 97* 97* 101  CO2 23 21* 18* 22  GLUCOSE 145* 157* 214* 196*  BUN 17 20 24* 20  CREATININE 0.69 0.73 0.73 0.60  CALCIUM 8.8* 8.8* 8.6* 8.9  MG  --  1.3*  --   --   PHOS  --  2.9  --   --    GFR: Estimated Creatinine Clearance: 36 mL/min (by C-G formula based on SCr of 0.6  mg/dL). Liver Function Tests: Recent Labs  Lab 09/20/22 1723 09/21/22 0431 09/22/22 0432 09/23/22 0415  AST 20 14* 16 76*  ALT 10 9 11  38  ALKPHOS 37* 40 49 59  BILITOT 1.2 1.3* 1.2 0.8  PROT 6.5 6.4* 6.5 6.7  ALBUMIN 3.3* 3.2* 3.0* 2.9*   Recent Results (from the past 240 hour(s))  Urine Culture     Status: Abnormal   Collection Time: 09/20/22  2:52 PM   Specimen: Urine, Clean Catch  Result Value Ref Range Status   Specimen Description   Final  URINE, CLEAN CATCH Performed at [facility], [address] [place], [place] [address]    Special Requests   Final    NONE Performed at [facility], [address], [place], [place] [address]    Culture >=100,000 COLONIES/mL KLEBSIELLA PNEUMONIAE (A)  Final   Report Status 09/22/2022 FINAL  Final   Organism ID, Bacteria KLEBSIELLA PNEUMONIAE (A)  Final      Susceptibility   Klebsiella pneumoniae - MIC*    AMPICILLIN >=32 RESISTANT Resistant     CEFAZOLIN <=4 SENSITIVE Sensitive     CEFEPIME <=0.12 SENSITIVE Sensitive     CEFTRIAXONE <=0.25 SENSITIVE Sensitive     CIPROFLOXACIN <=0.25 SENSITIVE Sensitive     GENTAMICIN <=1 SENSITIVE Sensitive     IMIPENEM <=0.25 SENSITIVE Sensitive     NITROFURANTOIN 64 INTERMEDIATE Intermediate     TRIMETH/SULFA <=20 SENSITIVE Sensitive     AMPICILLIN/SULBACTAM 4 SENSITIVE Sensitive     PIP/TAZO <=4 SENSITIVE Sensitive     * >=100,000 COLONIES/mL KLEBSIELLA PNEUMONIAE  SARS Coronavirus 2 by RT PCR (hospital order, performed in [place] hospital lab) *cepheid single result test* Anterior Nasal Swab     Status: Abnormal   Collection Time: 09/20/22  3:40 PM   Specimen: Anterior Nasal Swab  Result Value Ref Range Status   SARS Coronavirus 2 by RT PCR POSITIVE (A) NEGATIVE Final    Comment: (NOTE) SARS-CoV-2 target nucleic acids are DETECTED  SARS-CoV-2 RNA is generally detectable in upper respiratory specimens  during the acute phase of infection.  Positive results are indicative  of  the presence of the identified virus, but do not rule out bacterial infection or co-infection with other pathogens not detected by the test.  Clinical correlation with patient history and  other diagnostic information is necessary to determine patient infection status.  The expected result is negative.  Fact Sheet for Patients:   https://www.patel.info/   Fact Sheet for Healthcare Providers:   https://hall.com/    This test is not yet approved or cleared by the [place] FDA and  has been authorized for detection and/or diagnosis of SARS-CoV-2 by FDA under an Emergency Use Authorization (EUA).  This EUA will remain in effect (meaning this test can be used) for the duration of  the COVID-19 declaration under Section 564(b)(1)  of the Act, 21 U.S.C. section 360-bbb-3(b)(1), unless the authorization is terminated or revoked sooner.   Performed at [facility], [address] [place],  [address]     Radiology Studies: No results found.   Scheduled Meds:  apixaban  5 mg Oral BID   atenolol  50 mg Oral BID   cefdinir  300 mg Oral Q12H   insulin aspart  0-5 Units Subcutaneous QHS   insulin aspart  0-9 Units Subcutaneous TID WC   molnupiravir EUA  4 capsule Oral BID   predniSONE  20 mg Oral Q breakfast   Continuous Infusions:    LOS: 3 days    Roxan Hockey M.D on 09/23/2022 at 4:01 PM  Go to [email or web address] - for contact info  [facility] - Office  [phone]  If 7PM-7AM, please contact night-coverage [email or web address] 09/23/2022, 4:01 PM

## 2022-09-24 DIAGNOSIS — I5032 Chronic diastolic (congestive) heart failure: Secondary | ICD-10-CM | POA: Diagnosis present

## 2022-09-24 DIAGNOSIS — G9341 Metabolic encephalopathy: Secondary | ICD-10-CM | POA: Diagnosis not present

## 2022-09-24 LAB — COMPREHENSIVE METABOLIC PANEL
ALT: 56 U/L — ABNORMAL HIGH (ref 0–44)
AST: 63 U/L — ABNORMAL HIGH (ref 15–41)
Albumin: 2.6 g/dL — ABNORMAL LOW (ref 3.5–5.0)
Alkaline Phosphatase: 53 U/L (ref 38–126)
Anion gap: 8 (ref 5–15)
BUN: 20 mg/dL (ref 8–23)
CO2: 22 mmol/L (ref 22–32)
Calcium: 8.9 mg/dL (ref 8.9–10.3)
Chloride: 105 mmol/L (ref 98–111)
Creatinine, Ser: 0.59 mg/dL (ref 0.44–1.00)
GFR, Estimated: >60 mL/min (ref >60)
Glucose, Bld: 175 mg/dL — ABNORMAL HIGH (ref 70–99)
Potassium: 3.8 mmol/L (ref 3.5–5.1)
Sodium: 135 mmol/L (ref 135–145)
Total Bilirubin: 0.8 mg/dL (ref 0.3–1.2)
Total Protein: 6 g/dL — ABNORMAL LOW (ref 6.5–8.1)

## 2022-09-24 LAB — GLUCOSE, CAPILLARY
Glucose-Capillary: 192 mg/dL — ABNORMAL HIGH (ref 70–99)
Glucose-Capillary: 234 mg/dL — ABNORMAL HIGH (ref 70–99)
Glucose-Capillary: 157 mg/dL — ABNORMAL HIGH (ref 70–99)
Glucose-Capillary: 175 mg/dL — ABNORMAL HIGH (ref 70–99)

## 2022-09-24 MED ORDER — MOLNUPIRAVIR EUA 200MG CAPSULE: 4.0000 | ORAL_CAPSULE | Freq: Two times a day (BID) | ORAL | 0 refills | Status: AC

## 2022-09-24 MED ORDER — ATENOLOL 100 MG PO TABS: 100.0000 mg | ORAL_TABLET | Freq: Two times a day (BID) | ORAL | 3 refills | Status: DC

## 2022-09-24 MED ORDER — PREDNISONE 20 MG PO TABS: 40.0000 mg | ORAL_TABLET | Freq: Every day | ORAL | 0 refills | Status: AC

## 2022-09-24 MED ORDER — CEPHALEXIN 500 MG PO CAPS: 500.0000 mg | ORAL_CAPSULE | Freq: Three times a day (TID) | ORAL | 0 refills | Status: AC

## 2022-09-24 NOTE — Progress Notes (Signed)
Physical Therapy Treatment Patient Details Name: [name] MRN: [id number] DOB: 08/06/1927 Today's Date: 09/24/2022   History of Present Illness EVANGELINA DELANCEY is a 88 y.o. female with medical history significant for diabetes mellitus, atrial fibrillation on chronic anticoagulation, hypertension, CKD 3, gout.  Patient was brought to the ED from home by EMS with reports of weakness and confusion.  Patient's daughter- [name] is at bedside and provides the history.  Patient lives at home, but family is with patient's 24 hours a day.  With time of my evaluation, patient is awake oriented to person only, she is unable to answer questions or give details.    Patient's daughter reports that patient weakness started on Monday-4 days ago, she updated her COVID shot that day.  Since then she has progressively gotten more weak, that she was unable to get out of bed and confused.  She could not follow directions, and yesterday she was saying "gibberish".  She reports patient is a high fall risk.  She ambulates with a walker.  She has a mild chronic cough at night which is unchanged, no difficulty breathing.  No fever no chills no vomiting no loose stools.    PT Comments    Patient demonstrates much improvement for bed mobility, transfers and walking in room requiring mostly Supervision/Min guard assist without loss of balance.  Patient limited mostly due to fatigue and tolerated staying up in chair after therapy with her daughter present at bedside.  Patient will benefit from continued skilled physical therapy in hospital and recommended venue below to increase strength, balance, endurance for safe ADLs and gait.     Recommendations for follow up therapy are one component of a multi-disciplinary discharge planning process, led by the attending physician.  Recommendations may be updated based on patient status, additional functional criteria and insurance authorization.  Follow Up Recommendations  Home health  PT Can patient physically be transported by private vehicle: Yes   Assistance Recommended at Discharge Set up Supervision/Assistance  Patient can return home with the following A little help with walking and/or transfers;A little help with bathing/dressing/bathroom;Help with stairs or ramp for entrance;Assistance with cooking/housework   Equipment Recommendations  None recommended by PT    Recommendations for Other Services       Precautions / Restrictions Precautions Precautions: Fall Restrictions Weight Bearing Restrictions: No     Mobility  Bed Mobility Overal bed mobility: Needs Assistance Bed Mobility: Supine to Sit, Sit to Supine     Supine to sit: Supervision Sit to supine: Min guard, Supervision   General bed mobility comments: had diffiuclty moving BLE onto bed during sit to supine requiring increased time    Transfers Overall transfer level: Needs assistance Equipment used: Rolling walker (2 wheels) Transfers: Sit to/from Stand, Bed to chair/wheelchair/BSC Sit to Stand: Min guard, Supervision   Step pivot transfers: Supervision, Min guard       General transfer comment: demonstrates increased BLE stregnth for sit to stands and transfers    Ambulation/Gait Ambulation/Gait assistance: Supervision, Min guard Gait Distance (Feet): 40 Feet Assistive device: Rolling walker (2 wheels) Gait Pattern/deviations: Decreased step length - right, Decreased step length - left, Decreased stride length, Trunk flexed Gait velocity: decreased     General Gait Details: demonstrates increased endurance/distance for taking steps in room without loss of balance   Stairs             Wheelchair Mobility    Modified Rankin (Stroke Patients Only)  Balance Overall balance assessment: Needs assistance Sitting-balance support: Feet supported, No upper extremity supported Sitting balance-Leahy Scale: Fair Sitting balance - Comments: fair/good seated at EOB    Standing balance support: During functional activity, Reliant on assistive device for balance, Bilateral upper extremity supported Standing balance-Leahy Scale: Fair Standing balance comment: using RW                            Cognition Arousal/Alertness: Awake/alert Behavior During Therapy: WFL for tasks assessed/performed Overall Cognitive Status: Within Functional Limits for tasks assessed                                          Exercises      General Comments        Pertinent Vitals/Pain Pain Assessment Pain Assessment: No/denies pain    Home Living                          Prior Function            PT Goals (current goals can now be found in the care plan section) Acute Rehab PT Goals Patient Stated Goal: return home with family to assist PT Goal Formulation: With patient/family Time For Goal Achievement: 09/27/22 Potential to Achieve Goals: Good Progress towards PT goals: Progressing toward goals    Frequency    Min 3X/week      PT Plan Discharge plan needs to be updated    Co-evaluation              AM-PAC PT "6 Clicks" Mobility   Outcome Measure  Help needed turning from your back to your side while in a flat bed without using bedrails?: None Help needed moving from lying on your back to sitting on the side of a flat bed without using bedrails?: A Little Help needed moving to and from a bed to a chair (including a wheelchair)?: A Little Help needed standing up from a chair using your arms (e.g., wheelchair or bedside chair)?: A Little Help needed to walk in hospital room?: A Little Help needed climbing 3-5 steps with a railing? : A Lot 6 Click Score: 18    End of Session   Activity Tolerance: Patient tolerated treatment well;Patient limited by fatigue Patient left: in chair;with call bell/phone within reach;with family/visitor present Nurse Communication: Mobility status PT Visit Diagnosis:  Unsteadiness on feet (R26.81);Other abnormalities of gait and mobility (R26.89);Muscle weakness (generalized) (M62.81)     Time: 6144-3154 PT Time Calculation (min) (ACUTE ONLY): 25 min  Charges:  $Gait Training: 8-22 mins $Therapeutic Activity: 8-22 mins                     2:14 PM, 09/24/22 Lonell Grandchild, MPT Physical Therapist with [facility] [phone] office [phone] mobile phone

## 2022-09-24 NOTE — Discharge Instructions (Signed)
1)Repeat CBC and CMP blood test within a week 2)Increase Atenolol to 100 mg twice daily to better control blood pressure and heart rate 3)Avoid ibuprofen/Advil/Aleve/Motrin/Goody Powders/Naproxen/BC powders/Meloxicam/Diclofenac/Indomethacin and other Nonsteroidal anti-inflammatory medications as these will make you more likely to bleed and can cause stomach ulcers, can also cause Kidney problems.

## 2022-09-24 NOTE — Discharge Summary (Signed)
[name], is a 88 y.o. female  DOB May 24, 1927  MRN [id number].  Admission date:  09/20/2022  Admitting Physician  Bethena Roys, MD  Discharge Date:  09/24/2022   Primary MD  Quintin Alto, Silvestre Moment, MD  Recommendations for primary care physician for things to follow:   1)Repeat CBC and CMP blood test within a week 2)Increase Atenolol to 100 mg twice daily to better control blood pressure and heart rate 3)Avoid ibuprofen/Advil/Aleve/Motrin/Goody Powders/Naproxen/BC powders/Meloxicam/Diclofenac/Indomethacin and other Nonsteroidal anti-inflammatory medications as these will make you more likely to bleed and can cause stomach ulcers, can also cause Kidney problems.   Admission Diagnosis  Hyponatremia [E87.1] Acute cystitis without hematuria [D32.67] Acute metabolic encephalopathy [T24.58] COVID-19 virus infection [U07.1]  Discharge Diagnosis  Hyponatremia [E87.1] Acute cystitis without hematuria [K99.83] Acute metabolic encephalopathy [J82.50] COVID-19 virus infection [U07.1]    Principal Problem:   Acute metabolic encephalopathy Active Problems:   Chronic diastolic CHF (congestive heart failure) ([place])   COVID-19 virus infection   UTI (urinary tract infection)   Atrial fibrillation (HCC)   Hyponatremia   Type 2 diabetes mellitus ([place])   Current use of long term anticoagulation   Generalized weakness      Past Medical History:  Diagnosis Date   Atrial fibrillation ([place])    Coronary atherosclerosis of native coronary artery    Nonobstructive   Dyslipidemia    PSVT (paroxysmal supraventricular tachycardia)    TIA (transient ischemic attack)    Type 2 diabetes mellitus ([place])    Diet controlled    Past Surgical History:  Procedure Laterality Date   ABDOMINAL HYSTERECTOMY     CARPAL TUNNEL RELEASE     x's 2   CHOLECYSTECTOMY     REPLACEMENT TOTAL KNEE  2002 & 2012   SHOULDER SURGERY  2001      HPI  from the history and physical done on the day of admission:   Chief Complaint: Weakness, confusion   HPI: [name] is a 88 y.o. female with medical history significant for diabetes mellitus, atrial fibrillation on chronic anticoagulation, hypertension, CKD 3, gout. Patient was brought to the ED from home by EMS with reports of weakness and confusion.  Patient's daughter- [name] is at bedside and provides the history.  Patient lives at home, but family is with patient's 24 hours a day.  With time of my evaluation, patient is awake oriented to person only, she is unable to answer questions or give details.   Patient's daughter reports that patient weakness started on Monday-4 days ago, she updated her COVID shot that day.  Since then she has progressively gotten more weak, that she was unable to get out of bed and confused.  She could not follow directions, and yesterday she was saying "gibberish".  She reports patient is a high fall risk.  She ambulates with a walker.  She has a mild chronic cough at night which is unchanged, no difficulty breathing.  No fever no chills no vomiting no loose stools.   Recent hospitalization 9/30  to 10/3 for altered mental status thought multifactorial in etiology -Per natremia, diverticulitis, cystitis.     ED Course: Tmax 98.7.  Heart rate 63-108.  Respiratory rate 17-24.  Blood pressure systolic 580-998.  O2 sat 95 to 100% on room air. Sodium 130.  UA with large leukocytes.  COVID test positive.  Chest x-ray low but shows bibasilar atelectasis.  EKG shows atrial fibrillation.  Head CT shows stable chronic findings. IV ceftriaxone started.  Hospitalist admit for altered mental status likely secondary to COVID and UTI.   Review of Systems: As per HPI all other systems reviewed and negative.   Hospital Course:     Brief Narrative:  88 y.o. female with medical history significant for diabetes mellitus, atrial fibrillation on chronic anticoagulation,  hypertension, CKD 3, gout admitted with acute respiratory concerns in setting of underlying advanced dementia with sundowning, UTI and superimposed COVID-19 respiratory infection    -Assessment and Plan: 1)Acute metabolic encephalopathy Confusion.  Head CT shows stable chronic findings.  Encephalopathy likely secondary to UTI and COVID-19 infection.  -Treat for dehydration, UTI and COVID-19 infection -Mentation has improved significantly with above treatment   COVID-19 virus infection with acute hypoxic respiratory failure COVID-19 infection.  -Previously vaccinated against COVID. -Got a new COVID booster on 09/17/2022 before she found that she was already infected -- chest x-ray showing bibasilar atelectasis.  -  C/n Molnupiravir, as she is on anticoagulation with Eliquis. -Respiratory status improving with above measures -Weaned off oxygen on 09/22/2022   Paroxysmal atrial fibrillation (HCC) -Continue Eliquis for stroke prophylaxis -Continue atenolol for rate control   Klebsiella UTI (urinary tract infection) -Urine culture from 09/20/2022 with Klebsiella -Treated with IV ceftriaxone -Okay to complete Omnicef started on 09/22/2022    Hyponatremia  Chronic hyponatremia over the past 2 months sodium has ranged from 120-130. -Gentle hydration orally and IV   Type 2 diabetes mellitus (HCC) Hold metformin Use Novolog/Humalog Sliding scale insulin with Accu-Cheks/Fingersticks as ordered   Generalized weakness Likely secondary to COVID infection and UTI.  Daughter reports of high fall risk. -PT eval appreciated, recommends SNF rehab     Disposition: The patient is from: Home              Anticipated d/c is to: Home with HH Vs SNF               Discharge Condition: ***  Follow UP   Follow-up Information     Sasser, Silvestre Moment, MD. Schedule an appointment as soon as possible for a visit.   Specialty: Family Medicine Why: Repeat CBC and CMP blood test in 1 week Contact  information: [place] [address] [phone]                  Consults obtained - ***  Diet and Activity recommendation:  As advised  Discharge Instructions    **** Discharge Instructions     Call MD for:  difficulty breathing, headache or visual disturbances   Complete by: As directed    Call MD for:  persistant dizziness or light-headedness   Complete by: As directed    Call MD for:  persistant nausea and vomiting   Complete by: As directed    Call MD for:  temperature >100.4   Complete by: As directed    Diet - low sodium heart healthy   Complete by: As directed    Diet Carb Modified   Complete by: As directed    Discharge instructions  Complete by: As directed    1)Repeat CBC and CMP blood test within a week 2)Increase Atenolol to 100 mg twice daily to better control blood pressure and heart rate 3)Avoid ibuprofen/Advil/Aleve/Motrin/Goody Powders/Naproxen/BC powders/Meloxicam/Diclofenac/Indomethacin and other Nonsteroidal anti-inflammatory medications as these will make you more likely to bleed and can cause stomach ulcers, can also cause Kidney problems.   Increase activity slowly   Complete by: As directed          Discharge Medications     Allergies as of 09/24/2022       Reactions   Toprol Xl [metoprolol Succinate] Other (See Comments)   HAIR LOSS        Medication List     TAKE these medications    acetaminophen 500 MG tablet Commonly known as: TYLENOL Take 1,000 mg by mouth 2 (two) times daily.   apixaban 5 MG Tabs tablet Commonly known as: ELIQUIS Take 5 mg by mouth 2 (two) times daily.   atenolol 100 MG tablet Commonly known as: TENORMIN Take 1 tablet (100 mg total) by mouth 2 (two) times daily. What changed:  medication strength how much to take   cephALEXin 500 MG capsule Commonly known as: Keflex Take 1 capsule (500 mg total) by mouth 3 (three) times daily for 5 days.   metFORMIN 500 MG 24 hr  tablet Commonly known as: GLUCOPHAGE-XR Take 2 tabs (1,000mg ) by mouth every morning & 1 tab (500mg ) every evening   molnupiravir EUA 200 mg Caps capsule Commonly known as: LAGEVRIO Take 4 capsules (800 mg total) by mouth 2 (two) times daily for 5 days.   multivitamin Liqd Take 5 mLs by mouth daily.   predniSONE 20 MG tablet Commonly known as: DELTASONE Take 2 tablets (40 mg total) by mouth daily with breakfast for 5 days.   simvastatin 10 MG tablet Commonly known as: ZOCOR Take 10 mg by mouth at bedtime.               Durable Medical Equipment  (From admission, onward)           Start     Ordered   09/24/22 1340  For home use only DME Hospital bed  Once       Question Answer Comment  Length of Need Lifetime   Patient has (list medical condition): Congestive heart failure   The above medical condition requires: Patient requires the ability to reposition frequently   Head must be elevated greater than: 30 degrees   Bed type Semi-electric   Support Surface: Gel Overlay      09/24/22 1339            Major procedures and Radiology Reports - PLEASE review detailed and final reports for all details, in brief -   ***  DG Chest Portable 1 View  Result Date: 09/20/2022 CLINICAL DATA:  Weakness EXAM: PORTABLE CHEST 1 VIEW COMPARISON:  08/18/2022 FINDINGS: Stable cardiomegaly. Aortic atherosclerosis. Very low lung volumes. Mild bibasilar atelectasis. No pleural effusion or pneumothorax. IMPRESSION: Very low lung volumes with mild bibasilar atelectasis. Electronically Signed   By: Davina Poke D.O.   On: 09/20/2022 15:56   CT Head Wo Contrast  Result Date: 09/20/2022 CLINICAL DATA:  Altered mental status. EXAM: CT HEAD WITHOUT CONTRAST TECHNIQUE: Contiguous axial images were obtained from the base of the skull through the vertex without intravenous contrast. RADIATION DOSE REDUCTION: This exam was performed according to the departmental dose-optimization program  which includes automated exposure control, adjustment of the mA and/or  kV according to patient size and/or use of iterative reconstruction technique. COMPARISON:  07/06/2022 FINDINGS: Brain: Stable age related cerebral atrophy, ventriculomegaly and periventricular white matter disease. No extra-axial fluid collections are identified. No CT findings for acute hemispheric infarction or intracranial hemorrhage. No mass lesions. The brainstem and cerebellum are normal. Vascular: Stable advanced vascular calcifications but no aneurysm or hyperdense vessels. Skull: No skull fracture or bone lesions. Sinuses/Orbits: The paranasal sinuses and mastoid air cells are clear. The globes are intact. Other: No scalp lesions or scalp hematoma. IMPRESSION: 1. Stable age related cerebral atrophy, ventriculomegaly and periventricular white matter disease. 2. No acute intracranial findings or mass lesions. Electronically Signed   By: Marijo Sanes M.D.   On: 09/20/2022 15:45    Micro Results   *** Recent Results (from the past 240 hour(s))  Urine Culture     Status: Abnormal   Collection Time: 09/20/22  2:52 PM   Specimen: Urine, Clean Catch  Result Value Ref Range Status   Specimen Description   Final    URINE, CLEAN CATCH Performed at [facility], [address] [place], [place] [address]    Special Requests   Final    NONE Performed at [facility], [address], [place], [place] [address]    Culture >=100,000 COLONIES/mL KLEBSIELLA PNEUMONIAE (A)  Final   Report Status 09/22/2022 FINAL  Final   Organism ID, Bacteria KLEBSIELLA PNEUMONIAE (A)  Final      Susceptibility   Klebsiella pneumoniae - MIC*    AMPICILLIN >=32 RESISTANT Resistant     CEFAZOLIN <=4 SENSITIVE Sensitive     CEFEPIME <=0.12 SENSITIVE Sensitive     CEFTRIAXONE <=0.25 SENSITIVE Sensitive     CIPROFLOXACIN <=0.25 SENSITIVE Sensitive     GENTAMICIN <=1 SENSITIVE Sensitive     IMIPENEM <=0.25 SENSITIVE Sensitive     NITROFURANTOIN 64  INTERMEDIATE Intermediate     TRIMETH/SULFA <=20 SENSITIVE Sensitive     AMPICILLIN/SULBACTAM 4 SENSITIVE Sensitive     PIP/TAZO <=4 SENSITIVE Sensitive     * >=100,000 COLONIES/mL KLEBSIELLA PNEUMONIAE  SARS Coronavirus 2 by RT PCR (hospital order, performed in [place] hospital lab) *cepheid single result test* Anterior Nasal Swab     Status: Abnormal   Collection Time: 09/20/22  3:40 PM   Specimen: Anterior Nasal Swab  Result Value Ref Range Status   SARS Coronavirus 2 by RT PCR POSITIVE (A) NEGATIVE Final    Comment: (NOTE) SARS-CoV-2 target nucleic acids are DETECTED  SARS-CoV-2 RNA is generally detectable in upper respiratory specimens  during the acute phase of infection.  Positive results are indicative  of the presence of the identified virus, but do not rule out bacterial infection or co-infection with other pathogens not detected by the test.  Clinical correlation with patient history and  other diagnostic information is necessary to determine patient infection status.  The expected result is negative.  Fact Sheet for Patients:   https://www.patel.info/   Fact Sheet for Healthcare Providers:   https://hall.com/    This test is not yet approved or cleared by the [place] FDA and  has been authorized for detection and/or diagnosis of SARS-CoV-2 by FDA under an Emergency Use Authorization (EUA).  This EUA will remain in effect (meaning this test can be used) for the duration of  the COVID-19 declaration under Section 564(b)(1)  of the Act, 21 U.S.C. section 360-bbb-3(b)(1), unless the authorization is terminated or revoked sooner.   Performed at [facility], [address],  [place], [place] [address]     Today   Subjective    [name] today has no ***          Patient has been seen and examined prior to discharge   Objective   Blood pressure (!) 156/56, pulse 87, temperature (!) 97.5 F (36.4 C),  temperature source Oral, resp. rate 20, height 5' (1.524 m), weight 61.6 kg, SpO2 98 %.   Intake/Output Summary (Last 24 hours) at 09/24/2022 1341 Last data filed at 09/24/2022 0500 Gross per 24 hour  Intake 480 ml  Output 1000 ml  Net -520 ml    Exam Gen:-Frail, elderly and chronically ill-appearing HEENT:- .AT, No sclera icterus Neck-Supple Neck,No JVD,.  Lungs-improving air movement, no wheezing CV- S1, S2 normal, regular  Abd-  +ve B.Sounds, Abd Soft, No tenderness,    Extremity/Skin:- No  edema, pedal pulses present  Psych-affect is more appropriate, a lot more oriented, cooperative  Neuro-generalized weakness, no new focal deficits, no tremors     Data Review   CBC w Diff:  Lab Results  Component Value Date   WBC 4.9 09/23/2022   HGB 11.1 (L) 09/23/2022   HCT 33.7 (L) 09/23/2022   PLT 283 09/23/2022   LYMPHOPCT 9 09/23/2022   BANDSPCT 11 09/22/2022   MONOPCT 12 09/23/2022   EOSPCT 0 09/23/2022   BASOPCT 1 09/23/2022    CMP:  Lab Results  Component Value Date   NA 135 09/24/2022   K 3.8 09/24/2022   CL 105 09/24/2022   CO2 22 09/24/2022   BUN 20 09/24/2022   CREATININE 0.59 09/24/2022   PROT 6.0 (L) 09/24/2022   ALBUMIN 2.6 (L) 09/24/2022   BILITOT 0.8 09/24/2022   ALKPHOS 53 09/24/2022   AST 63 (H) 09/24/2022   ALT 56 (H) 09/24/2022  .  Total Discharge time is about 33 minutes  Roxan Hockey M.D on 09/24/2022 at 1:41 PM  Go to [email or web address] -  for contact info  [facility] - Office  [phone]

## 2022-09-24 NOTE — Progress Notes (Signed)
Patient stable discharging home with daughter. Patient's IV and tele removed. Writer went over discharge paperwork with daughter and she verbalized understanding. NT dressed patient and transported patient via [place] to daughters car. DNR paper given to daughter also

## 2022-09-24 NOTE — Progress Notes (Cosign Needed)
Patient requires frequent re-positioning of the body in ways that cannot be achieved with an ordinary bed or wedge pillow to eliminate pain and the head of the bed needs to be evaluated more than 30 degrees most of the time 

## 2022-09-24 NOTE — Care Management Important Message (Signed)
Important Message  Patient Details  Name: [name] MRN: [id number] Date of Birth: 09-09-1927   Medicare Important Message Given:  Yes (spoke with daughter Shakyra Mattera at [phone] to review letter, no additional copy needed)     Tommy Medal 09/24/2022, 1:17 PM

## 2022-09-24 NOTE — TOC Transition Note (Signed)
Transition of Care [facility]) - CM/SW Discharge Note   Patient Details  Name: [name] MRN: [id number] Date of Birth: February 27, 1927  [facility] of [facility] CM/SW Contact:  Ihor Gully, LCSW Phone Number: 09/24/2022, 1:52 PM   Clinical Narrative:    Daughter, [name], plans on taking patient home. She requests a hospital bed and [facility] services. Companies discussed. Referrals made to [facility] and [facility].    Final next level of care: [place] Barriers to Discharge: No Barriers Identified   Patient Goals and CMS Choice Patient states their goals for this hospitalization and ongoing recovery are:: unure      Discharge Placement                       Discharge Plan and Services                DME Arranged: Hospital bed DME Agency: [facility] Date DME Agency Contacted: 09/24/22 Time DME Agency Contacted: [phone] Representative spoke with at DME Agency: Erasmo Downer HH Arranged: RN, Nurse's Aide, PT [facility] Agency: [place] Date [place]: 09/24/22 Time [place]: 1352 Representative spoke with at [place]: [place] Determinants of Health ([place]) Interventions Housing Interventions: Intervention Not Indicated   Readmission Risk Interventions     No data to display

## 2022-09-25 ENCOUNTER — Other Ambulatory Visit: Payer: Self-pay | Admitting: Cardiology

## 2022-09-26 ENCOUNTER — Ambulatory Visit: Payer: Medicare Other | Admitting: Cardiology

## 2022-09-26 ENCOUNTER — Other Ambulatory Visit: Payer: Self-pay

## 2022-09-26 NOTE — Patient Outreach (Signed)
[name] 04-18-1927 [id number]   [facility] [ACO] Patient: Medicare ACO REACH  Primary Care Provider: Manon Hilding, MD with [facility]  Telephone outreach  Call placed to phone number listed for patient.    Spoke with daughter, [name], HIPAA verified that [name] is the contact person, regarding the benefits of [facility] Care Management services. [name] states she is the daughter with the patient today.  Outreach call to [place] and explained reason for follow up call for post hospital needs.  Explained that [facility] is a covered benefit of insurance and partnered with PCP.  She endorses Dr. Quintin Alto as PCP.  Review information for [facility] Care Management for care coordination follow up and offered information to be provided with contact information.  Encouraged to use MyChart.  Explained that [facility] does not interfere with or replace any services arranged by the inpatient Transition of Care [TOC] team from hospital stay.    Patient/family [daughter [name]]  declined services with [facility] Care Management at this time but states she will follow up with PCP if needs arises for Care Coordination needs.  Natividad Brood, RN BSN [place]  [phone] business mobile phone Toll free office [phone]  *[place]  [phone] Fax number: [phone] [name]

## 2022-09-28 DIAGNOSIS — E860 Dehydration: Secondary | ICD-10-CM | POA: Diagnosis not present

## 2022-09-28 DIAGNOSIS — A414 Sepsis due to anaerobes: Secondary | ICD-10-CM | POA: Diagnosis not present

## 2022-09-28 DIAGNOSIS — E1122 Type 2 diabetes mellitus with diabetic chronic kidney disease: Secondary | ICD-10-CM | POA: Diagnosis not present

## 2022-09-28 DIAGNOSIS — F05 Delirium due to known physiological condition: Secondary | ICD-10-CM | POA: Diagnosis not present

## 2022-09-28 DIAGNOSIS — R32 Unspecified urinary incontinence: Secondary | ICD-10-CM | POA: Diagnosis not present

## 2022-09-28 DIAGNOSIS — M1030 Gout due to renal impairment, unspecified site: Secondary | ICD-10-CM | POA: Diagnosis not present

## 2022-09-28 DIAGNOSIS — N3000 Acute cystitis without hematuria: Secondary | ICD-10-CM | POA: Diagnosis not present

## 2022-09-28 DIAGNOSIS — J9601 Acute respiratory failure with hypoxia: Secondary | ICD-10-CM | POA: Diagnosis not present

## 2022-09-28 DIAGNOSIS — I2510 Atherosclerotic heart disease of native coronary artery without angina pectoris: Secondary | ICD-10-CM | POA: Diagnosis not present

## 2022-09-28 DIAGNOSIS — J9811 Atelectasis: Secondary | ICD-10-CM | POA: Diagnosis not present

## 2022-09-28 DIAGNOSIS — E871 Hypo-osmolality and hyponatremia: Secondary | ICD-10-CM | POA: Diagnosis not present

## 2022-09-28 DIAGNOSIS — N183 Chronic kidney disease, stage 3 unspecified: Secondary | ICD-10-CM | POA: Diagnosis not present

## 2022-09-28 DIAGNOSIS — I700 Atherosclerosis of aorta: Secondary | ICD-10-CM | POA: Diagnosis not present

## 2022-09-28 DIAGNOSIS — E782 Mixed hyperlipidemia: Secondary | ICD-10-CM | POA: Diagnosis not present

## 2022-09-28 DIAGNOSIS — I5032 Chronic diastolic (congestive) heart failure: Secondary | ICD-10-CM | POA: Diagnosis not present

## 2022-09-28 DIAGNOSIS — G319 Degenerative disease of nervous system, unspecified: Secondary | ICD-10-CM | POA: Diagnosis not present

## 2022-09-28 DIAGNOSIS — F02818 Dementia in other diseases classified elsewhere, unspecified severity, with other behavioral disturbance: Secondary | ICD-10-CM | POA: Diagnosis not present

## 2022-09-28 DIAGNOSIS — I471 Supraventricular tachycardia, unspecified: Secondary | ICD-10-CM | POA: Diagnosis not present

## 2022-09-28 DIAGNOSIS — G9341 Metabolic encephalopathy: Secondary | ICD-10-CM | POA: Diagnosis not present

## 2022-09-28 DIAGNOSIS — Z7952 Long term (current) use of systemic steroids: Secondary | ICD-10-CM | POA: Diagnosis not present

## 2022-09-28 DIAGNOSIS — I130 Hypertensive heart and chronic kidney disease with heart failure and stage 1 through stage 4 chronic kidney disease, or unspecified chronic kidney disease: Secondary | ICD-10-CM | POA: Diagnosis not present

## 2022-09-28 DIAGNOSIS — I480 Paroxysmal atrial fibrillation: Secondary | ICD-10-CM | POA: Diagnosis not present

## 2022-09-28 DIAGNOSIS — Z7901 Long term (current) use of anticoagulants: Secondary | ICD-10-CM | POA: Diagnosis not present

## 2022-09-28 DIAGNOSIS — U071 COVID-19: Secondary | ICD-10-CM | POA: Diagnosis not present

## 2022-09-28 DIAGNOSIS — D631 Anemia in chronic kidney disease: Secondary | ICD-10-CM | POA: Diagnosis not present

## 2022-10-01 DIAGNOSIS — Z9181 History of falling: Secondary | ICD-10-CM | POA: Diagnosis not present

## 2022-10-01 DIAGNOSIS — U071 COVID-19: Secondary | ICD-10-CM | POA: Diagnosis not present

## 2022-10-01 DIAGNOSIS — R410 Disorientation, unspecified: Secondary | ICD-10-CM | POA: Diagnosis not present

## 2022-10-01 DIAGNOSIS — J9601 Acute respiratory failure with hypoxia: Secondary | ICD-10-CM | POA: Diagnosis not present

## 2022-10-01 DIAGNOSIS — A414 Sepsis due to anaerobes: Secondary | ICD-10-CM | POA: Diagnosis not present

## 2022-10-01 DIAGNOSIS — E860 Dehydration: Secondary | ICD-10-CM | POA: Diagnosis not present

## 2022-10-01 DIAGNOSIS — Z9189 Other specified personal risk factors, not elsewhere classified: Secondary | ICD-10-CM | POA: Diagnosis not present

## 2022-10-01 DIAGNOSIS — N3000 Acute cystitis without hematuria: Secondary | ICD-10-CM | POA: Diagnosis not present

## 2022-10-01 DIAGNOSIS — G9341 Metabolic encephalopathy: Secondary | ICD-10-CM | POA: Diagnosis not present

## 2022-10-01 DIAGNOSIS — E871 Hypo-osmolality and hyponatremia: Secondary | ICD-10-CM | POA: Diagnosis not present

## 2022-10-03 ENCOUNTER — Other Ambulatory Visit: Payer: Self-pay

## 2022-10-03 ENCOUNTER — Emergency Department (HOSPITAL_COMMUNITY): Payer: Medicare Other

## 2022-10-03 ENCOUNTER — Inpatient Hospital Stay (HOSPITAL_COMMUNITY)
Admission: EM | Admit: 2022-10-03 | Discharge: 2022-10-08 | DRG: 871 | Disposition: A | Discharge status: Skilled Nursing Facility | Payer: Medicare Other | Attending: Family Medicine | Admitting: Family Medicine

## 2022-10-03 ENCOUNTER — Encounter (HOSPITAL_COMMUNITY): Payer: Self-pay | Admitting: Emergency Medicine

## 2022-10-03 DIAGNOSIS — R531 Weakness: Secondary | ICD-10-CM

## 2022-10-03 DIAGNOSIS — Z8616 Personal history of COVID-19: Secondary | ICD-10-CM | POA: Diagnosis not present

## 2022-10-03 DIAGNOSIS — Z9189 Other specified personal risk factors, not elsewhere classified: Secondary | ICD-10-CM | POA: Diagnosis not present

## 2022-10-03 DIAGNOSIS — G9341 Metabolic encephalopathy: Secondary | ICD-10-CM | POA: Diagnosis present

## 2022-10-03 DIAGNOSIS — Z8249 Family history of ischemic heart disease and other diseases of the circulatory system: Secondary | ICD-10-CM | POA: Diagnosis not present

## 2022-10-03 DIAGNOSIS — Z6826 Body mass index (BMI) 26.0-26.9, adult: Secondary | ICD-10-CM

## 2022-10-03 DIAGNOSIS — I482 Chronic atrial fibrillation, unspecified: Secondary | ICD-10-CM | POA: Diagnosis present

## 2022-10-03 DIAGNOSIS — I2510 Atherosclerotic heart disease of native coronary artery without angina pectoris: Secondary | ICD-10-CM | POA: Diagnosis present

## 2022-10-03 DIAGNOSIS — I700 Atherosclerosis of aorta: Secondary | ICD-10-CM | POA: Diagnosis not present

## 2022-10-03 DIAGNOSIS — Z7901 Long term (current) use of anticoagulants: Secondary | ICD-10-CM | POA: Diagnosis not present

## 2022-10-03 DIAGNOSIS — E871 Hypo-osmolality and hyponatremia: Secondary | ICD-10-CM | POA: Diagnosis present

## 2022-10-03 DIAGNOSIS — E119 Type 2 diabetes mellitus without complications: Secondary | ICD-10-CM

## 2022-10-03 DIAGNOSIS — U071 COVID-19: Secondary | ICD-10-CM | POA: Diagnosis not present

## 2022-10-03 DIAGNOSIS — I480 Paroxysmal atrial fibrillation: Secondary | ICD-10-CM | POA: Diagnosis not present

## 2022-10-03 DIAGNOSIS — R6520 Severe sepsis without septic shock: Secondary | ICD-10-CM | POA: Diagnosis present

## 2022-10-03 DIAGNOSIS — E785 Hyperlipidemia, unspecified: Secondary | ICD-10-CM | POA: Diagnosis present

## 2022-10-03 DIAGNOSIS — Z9049 Acquired absence of other specified parts of digestive tract: Secondary | ICD-10-CM

## 2022-10-03 DIAGNOSIS — R319 Hematuria, unspecified: Secondary | ICD-10-CM | POA: Diagnosis present

## 2022-10-03 DIAGNOSIS — A419 Sepsis, unspecified organism: Principal | ICD-10-CM | POA: Diagnosis present

## 2022-10-03 DIAGNOSIS — Z79899 Other long term (current) drug therapy: Secondary | ICD-10-CM

## 2022-10-03 DIAGNOSIS — Z90710 Acquired absence of both cervix and uterus: Secondary | ICD-10-CM

## 2022-10-03 DIAGNOSIS — Z87440 Personal history of urinary (tract) infections: Secondary | ICD-10-CM | POA: Diagnosis not present

## 2022-10-03 DIAGNOSIS — B961 Klebsiella pneumoniae [K. pneumoniae] as the cause of diseases classified elsewhere: Secondary | ICD-10-CM | POA: Diagnosis present

## 2022-10-03 DIAGNOSIS — E782 Mixed hyperlipidemia: Secondary | ICD-10-CM | POA: Insufficient documentation

## 2022-10-03 DIAGNOSIS — Z1613 Resistance to carbapenem: Secondary | ICD-10-CM | POA: Diagnosis present

## 2022-10-03 DIAGNOSIS — F03C0 Unspecified dementia, severe, without behavioral disturbance, psychotic disturbance, mood disturbance, and anxiety: Secondary | ICD-10-CM | POA: Diagnosis not present

## 2022-10-03 DIAGNOSIS — I10 Essential (primary) hypertension: Secondary | ICD-10-CM | POA: Diagnosis present

## 2022-10-03 DIAGNOSIS — I4891 Unspecified atrial fibrillation: Secondary | ICD-10-CM

## 2022-10-03 DIAGNOSIS — E46 Unspecified protein-calorie malnutrition: Secondary | ICD-10-CM | POA: Insufficient documentation

## 2022-10-03 DIAGNOSIS — W010XXA Fall on same level from slipping, tripping and stumbling without subsequent striking against object, initial encounter: Secondary | ICD-10-CM | POA: Diagnosis present

## 2022-10-03 DIAGNOSIS — T68XXXA Hypothermia, initial encounter: Secondary | ICD-10-CM | POA: Diagnosis not present

## 2022-10-03 DIAGNOSIS — M6281 Muscle weakness (generalized): Secondary | ICD-10-CM | POA: Diagnosis present

## 2022-10-03 DIAGNOSIS — E860 Dehydration: Secondary | ICD-10-CM | POA: Diagnosis not present

## 2022-10-03 DIAGNOSIS — R410 Disorientation, unspecified: Secondary | ICD-10-CM | POA: Diagnosis not present

## 2022-10-03 DIAGNOSIS — E1165 Type 2 diabetes mellitus with hyperglycemia: Secondary | ICD-10-CM | POA: Diagnosis present

## 2022-10-03 DIAGNOSIS — R748 Abnormal levels of other serum enzymes: Secondary | ICD-10-CM | POA: Insufficient documentation

## 2022-10-03 DIAGNOSIS — N390 Urinary tract infection, site not specified: Secondary | ICD-10-CM | POA: Diagnosis present

## 2022-10-03 DIAGNOSIS — Z7984 Long term (current) use of oral hypoglycemic drugs: Secondary | ICD-10-CM

## 2022-10-03 DIAGNOSIS — R41841 Cognitive communication deficit: Secondary | ICD-10-CM | POA: Diagnosis present

## 2022-10-03 DIAGNOSIS — R5381 Other malaise: Secondary | ICD-10-CM | POA: Diagnosis not present

## 2022-10-03 DIAGNOSIS — G9340 Encephalopathy, unspecified: Secondary | ICD-10-CM | POA: Diagnosis not present

## 2022-10-03 DIAGNOSIS — Z9181 History of falling: Secondary | ICD-10-CM | POA: Diagnosis not present

## 2022-10-03 DIAGNOSIS — E441 Mild protein-calorie malnutrition: Secondary | ICD-10-CM | POA: Diagnosis present

## 2022-10-03 DIAGNOSIS — Z888 Allergy status to other drugs, medicaments and biological substances status: Secondary | ICD-10-CM

## 2022-10-03 DIAGNOSIS — J9601 Acute respiratory failure with hypoxia: Secondary | ICD-10-CM | POA: Diagnosis not present

## 2022-10-03 DIAGNOSIS — R000 Tachycardia, unspecified: Secondary | ICD-10-CM | POA: Diagnosis not present

## 2022-10-03 DIAGNOSIS — Z66 Do not resuscitate: Secondary | ICD-10-CM | POA: Diagnosis present

## 2022-10-03 DIAGNOSIS — M5134 Other intervertebral disc degeneration, thoracic region: Secondary | ICD-10-CM | POA: Diagnosis present

## 2022-10-03 DIAGNOSIS — R600 Localized edema: Secondary | ICD-10-CM | POA: Diagnosis not present

## 2022-10-03 DIAGNOSIS — E8809 Other disorders of plasma-protein metabolism, not elsewhere classified: Secondary | ICD-10-CM | POA: Diagnosis present

## 2022-10-03 DIAGNOSIS — N3000 Acute cystitis without hematuria: Secondary | ICD-10-CM | POA: Diagnosis not present

## 2022-10-03 DIAGNOSIS — W19XXXA Unspecified fall, initial encounter: Secondary | ICD-10-CM | POA: Diagnosis not present

## 2022-10-03 DIAGNOSIS — R0689 Other abnormalities of breathing: Secondary | ICD-10-CM | POA: Diagnosis not present

## 2022-10-03 DIAGNOSIS — E1122 Type 2 diabetes mellitus with diabetic chronic kidney disease: Secondary | ICD-10-CM | POA: Diagnosis present

## 2022-10-03 DIAGNOSIS — Z8673 Personal history of transient ischemic attack (TIA), and cerebral infarction without residual deficits: Secondary | ICD-10-CM

## 2022-10-03 DIAGNOSIS — Y92009 Unspecified place in unspecified non-institutional (private) residence as the place of occurrence of the external cause: Secondary | ICD-10-CM | POA: Diagnosis not present

## 2022-10-03 DIAGNOSIS — A414 Sepsis due to anaerobes: Secondary | ICD-10-CM | POA: Diagnosis not present

## 2022-10-03 DIAGNOSIS — Z043 Encounter for examination and observation following other accident: Secondary | ICD-10-CM | POA: Diagnosis not present

## 2022-10-03 DIAGNOSIS — R2689 Other abnormalities of gait and mobility: Secondary | ICD-10-CM | POA: Diagnosis present

## 2022-10-03 DIAGNOSIS — I959 Hypotension, unspecified: Secondary | ICD-10-CM | POA: Diagnosis not present

## 2022-10-03 LAB — COMPREHENSIVE METABOLIC PANEL
ALT: 14 U/L (ref 0–44)
AST: 13 U/L — ABNORMAL LOW (ref 15–41)
Albumin: 3.2 g/dL — ABNORMAL LOW (ref 3.5–5.0)
Alkaline Phosphatase: 52 U/L (ref 38–126)
Anion gap: 11 (ref 5–15)
BUN: 21 mg/dL (ref 8–23)
CO2: 25 mmol/L (ref 22–32)
Calcium: 8.8 mg/dL — ABNORMAL LOW (ref 8.9–10.3)
Chloride: 96 mmol/L — ABNORMAL LOW (ref 98–111)
Creatinine, Ser: 0.85 mg/dL (ref 0.44–1.00)
GFR, Estimated: >60 mL/min (ref >60)
Glucose, Bld: 207 mg/dL — ABNORMAL HIGH (ref 70–99)
Potassium: 4.2 mmol/L (ref 3.5–5.1)
Sodium: 132 mmol/L — ABNORMAL LOW (ref 135–145)
Total Bilirubin: 0.9 mg/dL (ref 0.3–1.2)
Total Protein: 6.7 g/dL (ref 6.5–8.1)

## 2022-10-03 LAB — CBC WITH DIFFERENTIAL/PLATELET
Abs Immature Granulocytes: 0.14 10*3/uL — ABNORMAL HIGH (ref 0.00–0.07)
Basophils Absolute: 0 10*3/uL (ref 0.0–0.1)
Basophils Relative: 0 %
Eosinophils Absolute: 0 10*3/uL (ref 0.0–0.5)
Eosinophils Relative: 0 %
HCT: 32.8 % — ABNORMAL LOW (ref 36.0–46.0)
Hemoglobin: 10.8 g/dL — ABNORMAL LOW (ref 12.0–15.0)
Immature Granulocytes: 1 %
Lymphocytes Relative: 4 %
Lymphs Abs: 0.6 10*3/uL — ABNORMAL LOW (ref 0.7–4.0)
MCH: 30.4 pg (ref 26.0–34.0)
MCHC: 32.9 g/dL (ref 30.0–36.0)
MCV: 92.4 fL (ref 80.0–100.0)
Monocytes Absolute: 0.8 10*3/uL (ref 0.1–1.0)
Monocytes Relative: 5 %
Neutro Abs: 14 10*3/uL — ABNORMAL HIGH (ref 1.7–7.7)
Neutrophils Relative %: 90 %
Platelets: 407 10*3/uL — ABNORMAL HIGH (ref 150–400)
RBC: 3.55 MIL/uL — ABNORMAL LOW (ref 3.87–5.11)
RDW: 15.7 % — ABNORMAL HIGH (ref 11.5–15.5)
WBC: 15.7 10*3/uL — ABNORMAL HIGH (ref 4.0–10.5)
nRBC: 0 % (ref 0.0–0.2)

## 2022-10-03 LAB — RESP PANEL BY RT-PCR (FLU A&B, COVID) ARPGX2
Influenza A by PCR: NEGATIVE
Influenza B by PCR: NEGATIVE
SARS Coronavirus 2 by RT PCR: POSITIVE — AB

## 2022-10-03 LAB — URINALYSIS, ROUTINE W REFLEX MICROSCOPIC
Bilirubin Urine: NEGATIVE
Glucose, UA: NEGATIVE mg/dL
Ketones, ur: NEGATIVE mg/dL
Nitrite: NEGATIVE
Protein, ur: 100 mg/dL — AB
Specific Gravity, Urine: 1.013 (ref 1.005–1.030)
WBC, UA: >50 WBC/hpf — ABNORMAL HIGH (ref 0–5)
pH: 6 (ref 5.0–8.0)

## 2022-10-03 LAB — AMMONIA: Ammonia: 12 umol/L (ref 9–35)

## 2022-10-03 LAB — LIPASE, BLOOD: Lipase: 64 U/L — ABNORMAL HIGH (ref 11–51)

## 2022-10-03 LAB — BLOOD GAS, VENOUS
Acid-Base Excess: 3.9 mmol/L — ABNORMAL HIGH (ref 0.0–2.0)
Bicarbonate: 27.6 mmol/L (ref 20.0–28.0)
Drawn by: 7012
O2 Saturation: 68 %
Patient temperature: 37.2
pCO2, Ven: 37 mmHg — ABNORMAL LOW (ref 44–60)
pH, Ven: 7.48 — ABNORMAL HIGH (ref 7.25–7.43)
pO2, Ven: 43 mmHg (ref 32–45)

## 2022-10-03 LAB — TROPONIN I (HIGH SENSITIVITY)
Troponin I (High Sensitivity): 7 ng/L (ref <18)
Troponin I (High Sensitivity): 7 ng/L (ref <18)

## 2022-10-03 MED ORDER — LACTATED RINGERS IV BOLUS
1000.0000 mL | Freq: Once | INTRAVENOUS | Status: AC
  Administered 2022-10-03: 1000 mL via INTRAVENOUS

## 2022-10-03 MED ORDER — METOPROLOL TARTRATE 5 MG/5ML IV SOLN
5.0000 mg | Freq: Once | INTRAVENOUS | Status: AC
  Administered 2022-10-03: 5 mg via INTRAVENOUS
  Filled 2022-10-03: qty 5

## 2022-10-03 MED ORDER — SODIUM CHLORIDE 0.9 % IV SOLN
1.0000 g | Freq: Once | INTRAVENOUS | Status: AC
  Administered 2022-10-03: 1 g via INTRAVENOUS
  Filled 2022-10-03: qty 10

## 2022-10-03 NOTE — ED Notes (Signed)
X-ray at bedside

## 2022-10-03 NOTE — ED Notes (Signed)
Lab at bedside

## 2022-10-03 NOTE — H&P (Signed)
History and Physical    Patient: [name] RJJ:[id number] DOB: 12/30/1926 DOA: 10/03/2022 DOS: the patient was seen and examined on 10/04/2022 PCP: Manon Hilding, MD  Patient coming from: Home  Chief Complaint:  Chief Complaint  Patient presents with   Fall   HPI: [name] is a 88 y.o. female with medical history significant of hypertension, atrial fibrillation on Eliquis, T2DM, hyperlipidemia who presents emergency department via EMS due to fall at home.  Patient was somnolent at bedside, she was easily arousable, but was unable to provide a history.  History was obtained from ED physician and daughter at bedside.  Per report, patient has 3-day onset of confusion whereby she sometimes does not recognize the daughter [name] with her and POA), unable to carry on conversation as she usually does at baseline.  She was also reported to have had 3-day onset of discomfort on urination including burning sensation on urination.  Low-grade fever at home and increased heart rate within past 24 hours PTA.  Patient recently completed Keflex due to prior UTI, current symptoms started shortly after completing the antibiotics.   Patient wanted to go to the restroom at home, daughter assisted her while she was ambulating with a walker.  However, due to weakness and fatigue, she lost balance and fell, but daughter was able to support her and prevent her from hitting her head. Patient was recently admitted from 11/2 to 11/6 due to acute metabolic encephalopathy secondary to UTI and COVID-19 infection  ED Course:  In the emergency department, she was tachypneic, BP was 151/99, pulse 75, O2 sat was 96% on room air and temperature was 28F.  Work-up in the ED showed leukocytosis, normocytic anemia, ammonia 12, troponin x2 was flat at 7, BMP was normal except for sodium of 132, chloride 96 and blood glucose of 207, albumin 3.2, lipase 64.  Lactic acid 1.6.  Urinalysis showed large leukocytes with greater than  50 WBCs.  Influenza A, B-.  SARS coronavirus 2 was positive (this was positive on prior test done on 11/2 while she was admitted here). Left wrist x-ray showed no acute displaced fracture or dislocation CT head without contrast showed no acute intracranial findings Pelvic x-ray was negative for acute traumatic injury Chest x-ray showed low lung volumes with no active disease She was treated with IV ceftriaxone, IV Lopressor 5 mg x 2 was given due to A-fib with RVR.  IV hydration was provided.  Hospitalist was asked to admit patient for further evaluation and management.  Review of Systems: Review of systems as noted in the HPI. All other systems reviewed and are negative.   Past Medical History:  Diagnosis Date   Atrial fibrillation ([place])    Coronary atherosclerosis of native coronary artery    Nonobstructive   Dyslipidemia    PSVT (paroxysmal supraventricular tachycardia)    TIA (transient ischemic attack)    Type 2 diabetes mellitus ([place])    Diet controlled   Past Surgical History:  Procedure Laterality Date   ABDOMINAL HYSTERECTOMY     CARPAL TUNNEL RELEASE     x's 2   CHOLECYSTECTOMY     REPLACEMENT TOTAL KNEE  2002 & 2012   SHOULDER SURGERY  2001    Social History:  reports that she has never smoked. She has never used smokeless tobacco. She reports that she does not drink alcohol and does not use drugs.   Allergies  Allergen Reactions   Toprol Xl [Metoprolol Succinate] Other (See Comments)  HAIR LOSS    Family History  Problem Relation Age of Onset   Coronary artery disease Mother      Prior to Admission medications   Medication Sig Start Date End Date Taking? Authorizing Provider  acetaminophen (TYLENOL) 500 MG tablet Take 1,000 mg by mouth 2 (two) times daily.    [provider]  apixaban (ELIQUIS) 5 MG TABS tablet Take 5 mg by mouth 2 (two) times daily.    [provider]  atenolol (TENORMIN) 100 MG tablet Take 1 tablet (100 mg total) by  mouth 2 (two) times daily. 09/24/22   Roxan Hockey, MD  metFORMIN (GLUCOPHAGE-XR) 500 MG 24 hr tablet Take 2 tabs (1,023m) by mouth every morning & 1 tab (5046m every evening 03/24/13   [provider]  Multiple Vitamin (MULTIVITAMIN) LIQD Take 5 mLs by mouth daily.    [provider]  simvastatin (ZOCOR) 10 MG tablet Take 10 mg by mouth at bedtime.    [provider]    Physical Exam: BP (!) 142/62   Pulse 93   Temp 99 F (37.2 C) (Oral)   Resp (!) 30   Ht 5' (1.524 m)   Wt 61 kg   SpO2 95%   BMI 26.26 kg/m   General: [age].o. year-old female ill appearing, somnolent, though easily arousable and in no acute distress.   HEENT: NCAT, PERRL, dry mucous membrane Neck: Supple, trachea medial Cardiovascular: Irregular rate and rhythm with no rubs or gallops.  No thyromegaly or JVD noted.  No lower extremity edema. 2/4 pulses in all 4 extremities. Respiratory: Clear to auscultation with no wheezes or rales. Good inspiratory effort. Abdomen: Soft, nontender nondistended with normal bowel sounds x4 quadrants. Muskuloskeletal: No cyanosis, clubbing or edema noted bilaterally Neuro: CN II-XII intact, strength 5/5 x 4, sensation, reflexes intact Skin: No ulcerative lesions noted or rashes Psychiatry: Mood is appropriate for condition and setting          Labs on Admission:  Basic Metabolic Panel: Recent Labs  Lab 10/03/22 1934  NA 132*  K 4.2  CL 96*  CO2 25  GLUCOSE 207*  BUN 21  CREATININE 0.85  CALCIUM 8.8*   Liver Function Tests: Recent Labs  Lab 10/03/22 1934  AST 13*  ALT 14  ALKPHOS 52  BILITOT 0.9  PROT 6.7  ALBUMIN 3.2*   Recent Labs  Lab 10/03/22 1934  LIPASE 64*   Recent Labs  Lab 10/03/22 2121  AMMONIA 12   CBC: Recent Labs  Lab 10/03/22 1934  WBC 15.7*  NEUTROABS 14.0*  HGB 10.8*  HCT 32.8*  MCV 92.4  PLT 407*   Cardiac Enzymes: No results for input(s): "CKTOTAL", "CKMB", "CKMBINDEX", "TROPONINI" in the last  168 hours.  BNP (last 3 results) No results for input(s): "BNP" in the last 8760 hours.  ProBNP (last 3 results) No results for input(s): "PROBNP" in the last 8760 hours.  CBG: No results for input(s): "GLUCAP" in the last 168 hours.  Radiological Exams on Admission: DG Wrist Complete Left  Result Date: 10/03/2022 CLINICAL DATA:  fall EXAM: LEFT WRIST - COMPLETE 3+ VIEW COMPARISON:  None Available. FINDINGS: Diffusely decreased bone density. There is no evidence of fracture or dislocation. Chondrocalcinosis. Radiocarpal and ulnocarpal degenerative changes. Scaphoid trapezoid and trapezium joint degenerative changes. Subcutaneus soft tissue edema of the wrist. IMPRESSION: No acute displaced fracture or dislocation. Electronically Signed   By: MoIven Finn.D.   On: 10/03/2022 22:02   CT Head Wo  Contrast  Result Date: 10/03/2022 CLINICAL DATA:  Delirium, fall EXAM: CT HEAD WITHOUT CONTRAST TECHNIQUE: Contiguous axial images were obtained from the base of the skull through the vertex without intravenous contrast. RADIATION DOSE REDUCTION: This exam was performed according to the departmental dose-optimization program which includes automated exposure control, adjustment of the mA and/or kV according to patient size and/or use of iterative reconstruction technique. COMPARISON:  09/20/2022 FINDINGS: Brain: No evidence of acute infarction, hemorrhage, hydrocephalus, extra-axial collection or mass lesion/mass effect. Patchy low-density changes within the periventricular and subcortical white matter compatible with chronic microvascular ischemic change. Mild diffuse cerebral volume loss. Vascular: Atherosclerotic calcifications involving the large vessels of the skull base. No unexpected hyperdense vessel. Skull: Normal. Negative for fracture or focal lesion. Sinuses/Orbits: No acute finding. Other: Negative for scalp hematoma. IMPRESSION: 1. No acute intracranial findings. 2. Chronic microvascular  ischemic change and cerebral volume loss. Electronically Signed   By: Davina Poke D.O.   On: 10/03/2022 21:59   DG Pelvis 1-2 Views  Result Date: 10/03/2022 CLINICAL DATA:  fall EXAM: PELVIS - 1-2 VIEW COMPARISON:  X-ray pelvis 08/06/2021 FINDINGS: There is no evidence of pelvic fracture or diastasis. Dislocation of the hips. No pelvic bone lesions are seen. IMPRESSION: Negative for acute traumatic injury. Electronically Signed   By: Iven Finn M.D.   On: 10/03/2022 21:59   DG Chest Portable 1 View  Result Date: 10/03/2022 CLINICAL DATA:  fall EXAM: PORTABLE CHEST 1 VIEW COMPARISON:  Chest x-ray 09/20/2022 FINDINGS: The heart and mediastinal contours are unchanged. Aortic calcification. Low lung volumes. No focal consolidation. Chronic coarsened interstitial markings with no overt pulmonary edema. No pleural effusion. No pneumothorax. No acute osseous abnormality. Bilateral shoulder degenerative changes. IMPRESSION: 1. Low lung volumes with no active disease. 2.  Aortic Atherosclerosis (ICD10-I70.0). Electronically Signed   By: Iven Finn M.D.   On: 10/03/2022 21:58    EKG: I independently viewed the EKG done and my findings are as followed: A-fib with RVR with repolarization abnormality  Assessment/Plan Present on Admission:  Sepsis secondary to UTI ([place])  COVID-19 virus infection  Chronic atrial fibrillation with RVR ([place])  Principal Problem:   Sepsis secondary to UTI [facility]) Active Problems:   COVID-19 virus infection   Chronic atrial fibrillation with RVR ([place])   Generalized weakness   Fall at home, initial encounter   Type 2 diabetes mellitus with hyperglycemia ([facility])   Elevated lipase   Hypoalbuminemia due to protein-calorie malnutrition ([place])   Mixed hyperlipidemia  Sepsis secondary to UTI Patient met sepsis criteria due to being tachypneic and IV leukocytosis with a left shift, with source of infection being UTI Prior urine cultures on 09/20/2022 and 05/01/2022  were positive for Klebsiella pneumonia which was sensitive to ceftriaxone Patient was started on IV ceftriaxone, we shall continue same at this time Blood culture and urine culture pending Continue Tylenol as needed  COVID-19 virus infection Patient was already treated with molnupiravir during last admission She was previously vaccinated against COVID and she got a new COVID booster on 09/17/22 Continue supportive care  Fall at home due to generalized weakness This is possibly secondary to patient's COVID and UTI. Continue fall precaution Continue PT/OT eval and treat  Hypoalbuminemia possibly secondary to mild protein calorie malnutrition Albumin 3.2, protein supplement to be provided  Elevated lipase Lipase 64, she denies any abdominal pain, no history of pancreatitis Continue to monitor and treat accordingly  Chronic atrial fibrillation with RVR IV Lopressor 5 mg x 2 was  given in the ED with patient's HR pain better controlled Continue Eliquis and atenolol  T2DM with uncontrolled hyperglycemia CBG was 207, hemoglobin A1c was 6.2 (prediabetic range) about 3 months ago Continue ISS and hypoglycemia protocol Continue to hold metformin  DVT prophylaxis: Eliquis  Code Status: DNR  Consults: None  Family Communication: Daughter at bedside (all questions answered to satisfaction)  Severity of Illness: The appropriate patient status for this patient is INPATIENT. Inpatient status is judged to be reasonable and necessary in order to provide the required intensity of service to ensure the patient's safety. The patient's presenting symptoms, physical exam findings, and initial radiographic and laboratory data in the context of their chronic comorbidities is felt to place them at high risk for further clinical deterioration. Furthermore, it is not anticipated that the patient will be medically stable for discharge from the hospital within 2 midnights of admission.   * I certify that  at the point of admission it is my clinical judgment that the patient will require inpatient hospital care spanning beyond 2 midnights from the point of admission due to high intensity of service, high risk for further deterioration and high frequency of surveillance required.*  Author: Bernadette Hoit, DO 10/04/2022 3:08 AM  For on call review [email or web address].

## 2022-10-03 NOTE — ED Triage Notes (Signed)
Pt to ed via [facility] from home. Pt fell while walking with walker. Pt endorses generalized body aches. Bruising noted on left hand and right wrist. Pt is on eliquis.

## 2022-10-03 NOTE — ED Notes (Signed)
Patient transported to CT 

## 2022-10-03 NOTE — Sepsis Progress Note (Signed)
Following per sepsis protocol   

## 2022-10-03 NOTE — ED Provider Notes (Signed)
[facility] EMERGENCY DEPARTMENT Provider Note   CSN: [id number] Arrival date & time: 10/03/22  1934     History {Add pertinent medical, surgical, social history, OB history to HPI:1} Chief Complaint  Patient presents with   [name] is a 88 y.o. female.  Patient as above with significant medical history as below, including afib on doac/beta blocker, PSVT, hld, non obstructiver cad who presents to the ED with complaint of ams, elevated hr, dysuria. Pt accompanied by daughter who is caretaker/dpoa.  Reports over the past 3 days patient has had increased discomfort with urination, discolored urine, burning with urination, low-grade fever at home, elevated heart rate past 24 hours.  Recently completed Keflex from prior UTI, upon completion of antibiotic her symptoms started.  No nausea or vomiting.  Family reports increased fatigue, weakness over past 24 hours.  Compliant with home medications including DOAC     Past Medical History:  Diagnosis Date   Atrial fibrillation ([place])    Coronary atherosclerosis of native coronary artery    Nonobstructive   Dyslipidemia    PSVT (paroxysmal supraventricular tachycardia)    TIA (transient ischemic attack)    Type 2 diabetes mellitus ([place])    Diet controlled    Past Surgical History:  Procedure Laterality Date   ABDOMINAL HYSTERECTOMY     CARPAL TUNNEL RELEASE     x's 2   CHOLECYSTECTOMY     REPLACEMENT TOTAL KNEE  2002 & 2012   SHOULDER SURGERY  2001     The history is provided by the patient and a relative. No language interpreter was used.  Fall Pertinent negatives include no chest pain, no abdominal pain, no headaches and no shortness of breath.       Home Medications Prior to Admission medications   Medication Sig Start Date End Date Taking? Authorizing Provider  acetaminophen (TYLENOL) 500 MG tablet Take 1,000 mg by mouth 2 (two) times daily.    [provider]  apixaban (ELIQUIS) 5 MG TABS tablet  Take 5 mg by mouth 2 (two) times daily.    [provider]  atenolol (TENORMIN) 100 MG tablet Take 1 tablet (100 mg total) by mouth 2 (two) times daily. 09/24/22   Roxan Hockey, MD  metFORMIN (GLUCOPHAGE-XR) 500 MG 24 hr tablet Take 2 tabs (1,000mg ) by mouth every morning & 1 tab (500mg ) every evening 03/24/13   [provider]  Multiple Vitamin (MULTIVITAMIN) LIQD Take 5 mLs by mouth daily.    [provider]  simvastatin (ZOCOR) 10 MG tablet Take 10 mg by mouth at bedtime.    [provider]      Allergies    Toprol xl [metoprolol succinate]    Review of Systems   Review of Systems  Constitutional:  Positive for appetite change and fever. Negative for activity change.  HENT:  Negative for facial swelling and trouble swallowing.   Eyes:  Negative for discharge and redness.  Respiratory:  Negative for cough and shortness of breath.   Cardiovascular:  Negative for chest pain and palpitations.  Gastrointestinal:  Negative for abdominal pain and nausea.  Genitourinary:  Positive for dysuria and frequency. Negative for flank pain.  Musculoskeletal:  Positive for arthralgias. Negative for back pain and gait problem.  Skin:  Negative for pallor and rash.  Neurological:  Negative for syncope and headaches.    Physical Exam Updated Vital Signs BP 138/80   Pulse (!) 51   Temp 99 F (37.2  C) (Oral)   Resp (!) 31   Ht 5' (1.524 m)   Wt 61 kg   SpO2 95%   BMI 26.26 kg/m  Physical Exam Vitals and nursing note reviewed.  Constitutional:      General: She is not in acute distress.    Appearance: Normal appearance. She is not ill-appearing.  HENT:     Head: Normocephalic and atraumatic.     Right Ear: External ear normal.     Left Ear: External ear normal.     Nose: Nose normal.     Mouth/Throat:     Mouth: Mucous membranes are dry.  Eyes:     General: No scleral icterus.       Right eye: No discharge.        Left eye: No discharge.   Cardiovascular:     Rate and Rhythm: Tachycardia present. Rhythm irregular.     Pulses: Normal pulses.     Heart sounds: Normal heart sounds.  Pulmonary:     Effort: Pulmonary effort is normal. No respiratory distress.     Breath sounds: Normal breath sounds.  Abdominal:     General: Abdomen is flat.     Tenderness: There is no abdominal tenderness. There is no guarding or rebound.  Musculoskeletal:        General: Normal range of motion.     Cervical back: Normal range of motion.     Right lower leg: No edema.     Left lower leg: No edema.  Skin:    General: Skin is warm and dry.     Capillary Refill: Capillary refill takes less than 2 seconds.  Neurological:     Mental Status: She is alert. She is disoriented.     GCS: GCS eye subscore is 4. GCS verbal subscore is 5. GCS motor subscore is 6.     Cranial Nerves: Cranial nerves 2-12 are intact.     Sensory: Sensation is intact.     Motor: Motor function is intact.     Coordination: Coordination is intact.     Comments: Sleepy but arousable and will answer questions, Aox2 (unsure of where she is) typically is aox3  Gait not tested 2/2 pt safety   Psychiatric:        Mood and Affect: Mood normal.        Behavior: Behavior normal.     ED Results / Procedures / Treatments   Labs (all labs ordered are listed, but only abnormal results are displayed) Labs Reviewed  RESP PANEL BY RT-PCR (FLU A&B, COVID) ARPGX2 - Abnormal; Notable for the following components:      Result Value   SARS Coronavirus 2 by RT PCR POSITIVE (*)    All other components within normal limits  CBC WITH DIFFERENTIAL/PLATELET - Abnormal; Notable for the following components:   WBC 15.7 (*)    RBC 3.55 (*)    Hemoglobin 10.8 (*)    HCT 32.8 (*)    RDW 15.7 (*)    Platelets 407 (*)    Neutro Abs 14.0 (*)    Lymphs Abs 0.6 (*)    Abs Immature Granulocytes 0.14 (*)    All other components within normal limits  COMPREHENSIVE METABOLIC PANEL - Abnormal;  Notable for the following components:   Sodium 132 (*)    Chloride 96 (*)    Glucose, Bld 207 (*)    Calcium 8.8 (*)    Albumin 3.2 (*)    AST 13 (*)  All other components within normal limits  LIPASE, BLOOD - Abnormal; Notable for the following components:   Lipase 64 (*)    All other components within normal limits  URINALYSIS, ROUTINE W REFLEX MICROSCOPIC - Abnormal; Notable for the following components:   APPearance CLOUDY (*)    Hgb urine dipstick SMALL (*)    Protein, ur 100 (*)    Leukocytes,Ua LARGE (*)    WBC, UA >50 (*)    Bacteria, UA RARE (*)    All other components within normal limits  BLOOD GAS, VENOUS - Abnormal; Notable for the following components:   pH, Ven 7.48 (*)    pCO2, Ven 37 (*)    Acid-Base Excess 3.9 (*)    All other components within normal limits  AMMONIA  TROPONIN I (HIGH SENSITIVITY)  TROPONIN I (HIGH SENSITIVITY)    EKG None  Radiology DG Wrist Complete Left  Result Date: 10/03/2022 CLINICAL DATA:  fall EXAM: LEFT WRIST - COMPLETE 3+ VIEW COMPARISON:  None Available. FINDINGS: Diffusely decreased bone density. There is no evidence of fracture or dislocation. Chondrocalcinosis. Radiocarpal and ulnocarpal degenerative changes. Scaphoid trapezoid and trapezium joint degenerative changes. Subcutaneus soft tissue edema of the wrist. IMPRESSION: No acute displaced fracture or dislocation. Electronically Signed   By: Iven Finn M.D.   On: 10/03/2022 22:02   CT Head Wo Contrast  Result Date: 10/03/2022 CLINICAL DATA:  Delirium, fall EXAM: CT HEAD WITHOUT CONTRAST TECHNIQUE: Contiguous axial images were obtained from the base of the skull through the vertex without intravenous contrast. RADIATION DOSE REDUCTION: This exam was performed according to the departmental dose-optimization program which includes automated exposure control, adjustment of the mA and/or kV according to patient size and/or use of iterative reconstruction technique.  COMPARISON:  09/20/2022 FINDINGS: Brain: No evidence of acute infarction, hemorrhage, hydrocephalus, extra-axial collection or mass lesion/mass effect. Patchy low-density changes within the periventricular and subcortical white matter compatible with chronic microvascular ischemic change. Mild diffuse cerebral volume loss. Vascular: Atherosclerotic calcifications involving the large vessels of the skull base. No unexpected hyperdense vessel. Skull: Normal. Negative for fracture or focal lesion. Sinuses/Orbits: No acute finding. Other: Negative for scalp hematoma. IMPRESSION: 1. No acute intracranial findings. 2. Chronic microvascular ischemic change and cerebral volume loss. Electronically Signed   By: Davina Poke D.O.   On: 10/03/2022 21:59   DG Pelvis 1-2 Views  Result Date: 10/03/2022 CLINICAL DATA:  fall EXAM: PELVIS - 1-2 VIEW COMPARISON:  X-ray pelvis 08/06/2021 FINDINGS: There is no evidence of pelvic fracture or diastasis. Dislocation of the hips. No pelvic bone lesions are seen. IMPRESSION: Negative for acute traumatic injury. Electronically Signed   By: Iven Finn M.D.   On: 10/03/2022 21:59   DG Chest Portable 1 View  Result Date: 10/03/2022 CLINICAL DATA:  fall EXAM: PORTABLE CHEST 1 VIEW COMPARISON:  Chest x-ray 09/20/2022 FINDINGS: The heart and mediastinal contours are unchanged. Aortic calcification. Low lung volumes. No focal consolidation. Chronic coarsened interstitial markings with no overt pulmonary edema. No pleural effusion. No pneumothorax. No acute osseous abnormality. Bilateral shoulder degenerative changes. IMPRESSION: 1. Low lung volumes with no active disease. 2.  Aortic Atherosclerosis (ICD10-I70.0). Electronically Signed   By: Iven Finn M.D.   On: 10/03/2022 21:58    Procedures .Critical Care  Performed by: Jeanell Sparrow, DO Authorized by: Jeanell Sparrow, DO   Critical care provider statement:    Critical care time (minutes):  30   Critical care time  was exclusive of:  Separately billable procedures and treating other patients   Critical care was necessary to treat or prevent imminent or life-threatening deterioration of the following conditions:  Sepsis   Critical care was time spent personally by me on the following activities:  Development of treatment plan with patient or surrogate, discussions with consultants, evaluation of patient's response to treatment, examination of patient, ordering and review of laboratory studies, ordering and review of radiographic studies, ordering and performing treatments and interventions, pulse oximetry, re-evaluation of patient's condition, review of old charts and obtaining history from patient or surrogate   Care discussed with: admitting provider     {Document cardiac monitor, telemetry assessment procedure when appropriate:1}  Medications Ordered in ED Medications  metoprolol tartrate (LOPRESSOR) injection 5 mg (has no administration in time range)  metoprolol tartrate (LOPRESSOR) injection 5 mg (5 mg Intravenous Given 10/03/22 2202)  lactated ringers bolus 1,000 mL (0 mLs Intravenous Paused 10/03/22 2229)    ED Course/ Medical Decision Making/ A&P                           Medical Decision Making Amount and/or Complexity of Data Reviewed Labs: ordered. Radiology: ordered.  Risk Prescription drug management.   This patient presents to the ED with chief complaint(s) of ams, dysuria, low grade fever, elev hr with pertinent past medical history of recurrent uti which further complicates the presenting complaint. The complaint involves an extensive differential diagnosis and also carries with it a high risk of complications and morbidity.    The differential diagnosis includes but not limited to Differential diagnosis for adult fever includes but is not exclusive to community-acquired pneumonia, urinary tract infection, acute cholecystitis, viral syndrome, cellulitis, tick bourne disease,   decubitus ulcer, necrotizing fasciitis, meningitis, encephalitis, influenza, uti, etc.  . Serious etiologies were considered.   The initial plan is to screening labs/imaging   Additional history obtained: Additional history obtained from family Records reviewed previous admission documents and home medications, prior labs/cultures  Independent labs interpretation:  The following labs were independently interpreted:  COVID-positive, she was COVID-positive during prior admission Urinalysis consistent with infection, send culture WBC 15.7 Elev heart rate, elevated respiratory rate, leukocytosis, source of infection, meets criteria for sepsis  Independent visualization of imaging: - I independently visualized the following imaging with scope of interpretation limited to determining acute life threatening conditions related to emergency care: wrist xr L cxr, pelvis xr, CTH, which revealed no acute process  Cardiac monitoring was reviewed and interpreted by myself which shows afib rvr, now rate controlled after beta blocker  Treatment and Reassessment: Lopressor 5mg  x2 Rocephin LR bolus >> improved  Consultation: - Consulted or discussed management/test interpretation w/ external professional: na  Consideration for admission or further workup: Admission was considered    Pt with sepsis likely 2/2 UTI, she has AFIB with rvr, now rate controlled with beta blocker. Encephalopathy likely 2/2 sepsis. Recommend admission, pt /family agreeable.   Social Determinants of health: Social History   Tobacco Use   Smoking status: Never   Smokeless tobacco: Never  Vaping Use   Vaping Use: Never used  Substance Use Topics   Alcohol use: No    Alcohol/week: 0.0 standard drinks of alcohol   Drug use: No      {Document critical care time when appropriate:1} {Document review of labs and clinical decision tools ie heart score, Chads2Vasc2 etc:1}  {Document your independent review of  radiology images, and any outside records:1} {Document  your discussion with family members, caretakers, and with consultants:1} {Document social determinants of health affecting pt's care:1} {Document your decision making why or why not admission, treatments were needed:1} Final Clinical Impression(s) / ED Diagnoses Final diagnoses:  None    Rx / DC Orders ED Discharge Orders     None

## 2022-10-04 DIAGNOSIS — R748 Abnormal levels of other serum enzymes: Secondary | ICD-10-CM | POA: Insufficient documentation

## 2022-10-04 DIAGNOSIS — E782 Mixed hyperlipidemia: Secondary | ICD-10-CM | POA: Insufficient documentation

## 2022-10-04 DIAGNOSIS — N390 Urinary tract infection, site not specified: Secondary | ICD-10-CM | POA: Diagnosis not present

## 2022-10-04 DIAGNOSIS — A419 Sepsis, unspecified organism: Secondary | ICD-10-CM | POA: Diagnosis not present

## 2022-10-04 DIAGNOSIS — E1165 Type 2 diabetes mellitus with hyperglycemia: Secondary | ICD-10-CM | POA: Insufficient documentation

## 2022-10-04 DIAGNOSIS — W19XXXA Unspecified fall, initial encounter: Secondary | ICD-10-CM

## 2022-10-04 DIAGNOSIS — E8809 Other disorders of plasma-protein metabolism, not elsewhere classified: Secondary | ICD-10-CM | POA: Insufficient documentation

## 2022-10-04 LAB — COMPREHENSIVE METABOLIC PANEL
ALT: 14 U/L (ref 0–44)
AST: 14 U/L — ABNORMAL LOW (ref 15–41)
Albumin: 3.1 g/dL — ABNORMAL LOW (ref 3.5–5.0)
Alkaline Phosphatase: 50 U/L (ref 38–126)
Anion gap: 10 (ref 5–15)
BUN: 20 mg/dL (ref 8–23)
CO2: 25 mmol/L (ref 22–32)
Calcium: 8.6 mg/dL — ABNORMAL LOW (ref 8.9–10.3)
Chloride: 98 mmol/L (ref 98–111)
Creatinine, Ser: 0.82 mg/dL (ref 0.44–1.00)
GFR, Estimated: >60 mL/min (ref >60)
Glucose, Bld: 187 mg/dL — ABNORMAL HIGH (ref 70–99)
Potassium: 3.7 mmol/L (ref 3.5–5.1)
Sodium: 133 mmol/L — ABNORMAL LOW (ref 135–145)
Total Bilirubin: 1 mg/dL (ref 0.3–1.2)
Total Protein: 6.7 g/dL (ref 6.5–8.1)

## 2022-10-04 LAB — CBC
HCT: 30.4 % — ABNORMAL LOW (ref 36.0–46.0)
Hemoglobin: 9.7 g/dL — ABNORMAL LOW (ref 12.0–15.0)
MCH: 30.3 pg (ref 26.0–34.0)
MCHC: 31.9 g/dL (ref 30.0–36.0)
MCV: 95 fL (ref 80.0–100.0)
Platelets: 337 K/uL (ref 150–400)
RBC: 3.2 MIL/uL — ABNORMAL LOW (ref 3.87–5.11)
RDW: 15.7 % — ABNORMAL HIGH (ref 11.5–15.5)
WBC: 11.9 K/uL — ABNORMAL HIGH (ref 4.0–10.5)
nRBC: 0 % (ref 0.0–0.2)

## 2022-10-04 LAB — GLUCOSE, CAPILLARY
Glucose-Capillary: 175 mg/dL — ABNORMAL HIGH (ref 70–99)
Glucose-Capillary: 175 mg/dL — ABNORMAL HIGH (ref 70–99)
Glucose-Capillary: 230 mg/dL — ABNORMAL HIGH (ref 70–99)
Glucose-Capillary: 188 mg/dL — ABNORMAL HIGH (ref 70–99)

## 2022-10-04 LAB — MAGNESIUM: Magnesium: 1.2 mg/dL — ABNORMAL LOW (ref 1.7–2.4)

## 2022-10-04 LAB — LACTIC ACID, PLASMA: Lactic Acid, Venous: 1.6 mmol/L (ref 0.5–1.9)

## 2022-10-04 LAB — PHOSPHORUS: Phosphorus: 3.1 mg/dL (ref 2.5–4.6)

## 2022-10-04 MED ORDER — APIXABAN 5 MG PO TABS
5.0000 mg | ORAL_TABLET | Freq: Two times a day (BID) | ORAL | Status: DC
  Administered 2022-10-04 – 2022-10-08 (×10): 5 mg via ORAL
  Filled 2022-10-04 (×10): qty 1

## 2022-10-04 MED ORDER — INSULIN ASPART 100 UNIT/ML IJ SOLN
0.0000 [IU] | Freq: Three times a day (TID) | INTRAMUSCULAR | Status: DC
  Administered 2022-10-04: 3 [IU] via SUBCUTANEOUS
  Administered 2022-10-04 (×2): 2 [IU] via SUBCUTANEOUS
  Administered 2022-10-05: 1 [IU] via SUBCUTANEOUS
  Administered 2022-10-05: 2 [IU] via SUBCUTANEOUS
  Administered 2022-10-05: 5 [IU] via SUBCUTANEOUS
  Administered 2022-10-06: 3 [IU] via SUBCUTANEOUS
  Administered 2022-10-06 – 2022-10-07 (×3): 2 [IU] via SUBCUTANEOUS
  Administered 2022-10-07: 5 [IU] via SUBCUTANEOUS
  Administered 2022-10-07: 3 [IU] via SUBCUTANEOUS
  Administered 2022-10-08: 2 [IU] via SUBCUTANEOUS

## 2022-10-04 MED ORDER — ATENOLOL 25 MG PO TABS
100.0000 mg | ORAL_TABLET | Freq: Two times a day (BID) | ORAL | Status: DC
  Administered 2022-10-04 – 2022-10-08 (×10): 100 mg via ORAL
  Filled 2022-10-04 (×10): qty 4

## 2022-10-04 MED ORDER — SODIUM CHLORIDE 0.9 % IV SOLN: 1.0000 g | INTRAVENOUS | Status: DC

## 2022-10-04 MED ORDER — ACETAMINOPHEN 325 MG PO TABS
650.0000 mg | ORAL_TABLET | Freq: Four times a day (QID) | ORAL | Status: DC | PRN
  Administered 2022-10-05 – 2022-10-07 (×3): 650 mg via ORAL
  Filled 2022-10-04 (×3): qty 2

## 2022-10-04 MED ORDER — SODIUM CHLORIDE 0.9 % IV SOLN: INTRAVENOUS | Status: DC

## 2022-10-04 MED ORDER — LACTULOSE 10 GM/15ML PO SOLN
30.0000 g | Freq: Once | ORAL | Status: AC
  Administered 2022-10-04: 30 g via ORAL
  Filled 2022-10-04: qty 60

## 2022-10-04 MED ORDER — GLUCERNA SHAKE PO LIQD
237.0000 mL | Freq: Three times a day (TID) | ORAL | Status: DC
  Administered 2022-10-04 – 2022-10-07 (×10): 237 mL via ORAL
  Filled 2022-10-04 (×2): qty 237

## 2022-10-04 MED ORDER — SODIUM CHLORIDE 0.9 % IV SOLN
1.0000 g | INTRAVENOUS | Status: DC
  Administered 2022-10-04 – 2022-10-07 (×4): 1 g via INTRAVENOUS
  Filled 2022-10-04 (×4): qty 10

## 2022-10-04 MED ORDER — BISACODYL 10 MG RE SUPP
10.0000 mg | Freq: Once | RECTAL | Status: AC
  Administered 2022-10-04: 10 mg via RECTAL
  Filled 2022-10-04: qty 1

## 2022-10-04 MED ORDER — ONDANSETRON HCL 4 MG PO TABS: 4.0000 mg | ORAL_TABLET | Freq: Four times a day (QID) | ORAL | Status: DC | PRN

## 2022-10-04 MED ORDER — MAGNESIUM SULFATE 4 GM/100ML IV SOLN
4.0000 g | Freq: Once | INTRAVENOUS | Status: AC
  Administered 2022-10-04: 4 g via INTRAVENOUS
  Filled 2022-10-04: qty 100

## 2022-10-04 MED ORDER — SIMVASTATIN 20 MG PO TABS
10.0000 mg | ORAL_TABLET | Freq: Every day | ORAL | Status: DC
  Administered 2022-10-04 – 2022-10-07 (×5): 10 mg via ORAL
  Filled 2022-10-04 (×5): qty 1

## 2022-10-04 MED ORDER — ONDANSETRON HCL 4 MG/2ML IJ SOLN: 4.0000 mg | Freq: Four times a day (QID) | INTRAMUSCULAR | Status: DC | PRN

## 2022-10-04 MED ORDER — ACETAMINOPHEN 650 MG RE SUPP: 650.0000 mg | Freq: Four times a day (QID) | RECTAL | Status: DC | PRN

## 2022-10-04 NOTE — Evaluation (Signed)
Occupational Therapy Evaluation Patient Details Name: [name] MRN: [id number] DOB: [birth year] Today's Date: 10/04/2022   History of Present Illness [name] is a 88 y.o. female with medical history significant of hypertension, atrial fibrillation on Eliquis, T2DM, hyperlipidemia who presents emergency department via EMS due to fall at home.  Patient was somnolent at bedside, she was easily arousable, but was unable to provide a history.  History was obtained from ED physician and daughter at bedside.  Per report, patient has 3-day onset of confusion whereby she sometimes does not recognize the daughter [name] with her and POA), unable to carry on conversation as she usually does at baseline.  She was also reported to have had 3-day onset of discomfort on urination including burning sensation on urination.  Low-grade fever at home and increased heart rate within past 24 hours PTA.  Patient recently completed Keflex due to prior UTI, current symptoms started shortly after completing the antibiotics.    Patient wanted to go to the restroom at home, daughter assisted her while she was ambulating with a walker.  However, due to weakness and fatigue, she lost balance and fell, but daughter was able to support her and prevent her from hitting her head.  Patient was recently admitted from 11/2 to 11/6 due to acute metabolic encephalopathy secondary to UTI and COVID-19 infection   Clinical Impression   Pt agreeable to OT evaluation. Pt presented with flat affect and minimal engagement in conversation or report of history. Pt required tactile cuing to follow commands for bed mobility and transfers. Pt demonstrates limited B UE A/ROM with pain noted during P/ROM of shoulders. Mod to max A needed for bed mobility as well as transfer to chair using RW. Pt was unsteady in standing and needing much assist to pivot to the chair. No family present so much of history taken from document review. Pt left in the  chair with chair alarm set. Pt will benefit from continued OT in the hospital and recommended venue below to increase strength, balance, and endurance for safe ADL's.         Recommendations for follow up therapy are one component of a multi-disciplinary discharge planning process, led by the attending physician.  Recommendations may be updated based on patient status, additional functional criteria and insurance authorization.   Follow Up Recommendations  Skilled nursing-short term rehab (<3 hours/day)     Assistance Recommended at Discharge Frequent or constant Supervision/Assistance  Patient can return home with the following A lot of help with walking and/or transfers;A lot of help with bathing/dressing/bathroom;Assistance with cooking/housework;Assistance with feeding;Assist for transportation;Help with stairs or ramp for entrance;Direct supervision/assist for medications management    Functional Status Assessment  Patient has had a recent decline in their functional status and demonstrates the ability to make significant improvements in function in a reasonable and predictable amount of time.  Equipment Recommendations  None recommended by OT    Recommendations for Other Services       Precautions / Restrictions Precautions Precautions: Fall Restrictions Weight Bearing Restrictions: No      Mobility Bed Mobility Overal bed mobility: Needs Assistance Bed Mobility: Supine to Sit     Supine to sit: Mod assist, Max assist     General bed mobility comments: Much assist needed to move B LE to EOB and pull to sit.    Transfers Overall transfer level: Needs assistance Equipment used: Rolling walker (2 wheels) Transfers: Sit to/from Stand, Bed to chair/wheelchair/BSC Sit to Stand:  Mod assist     Step pivot transfers: Mod assist, Max assist     General transfer comment: Assist to boost from EOB. Much more assist needed for step to chair with pt struggle to motor plan  and seeming to possibly be resisting pivot to chair.      Balance Overall balance assessment: Needs assistance Sitting-balance support: Feet supported, No upper extremity supported Sitting balance-Leahy Scale: Fair Sitting balance - Comments: seated at EOB   Standing balance support: During functional activity, Reliant on assistive device for balance, Bilateral upper extremity supported Standing balance-Leahy Scale: Poor Standing balance comment: using RW                           ADL either performed or assessed with clinical judgement   ADL Overall ADL's : Needs assistance/impaired Eating/Feeding: Minimal assistance;Moderate assistance;Sitting   Grooming: Moderate assistance;Sitting   Upper Body Bathing: Moderate assistance;Maximal assistance;Sitting   Lower Body Bathing: Maximal assistance;Total assistance;Bed level   Upper Body Dressing : Moderate assistance;Sitting   Lower Body Dressing: Maximal assistance;Total assistance;Bed level   Toilet Transfer: Moderate assistance;Maximal assistance;Rolling walker (2 wheels);Stand-pivot Armed forces technical officer Details (indicate cue type and reason): Simulated via EOB to chair transfer with RW Toileting- Clothing Manipulation and Hygiene: Total assistance;Bed level               Vision Patient Visual Report:  (Pt unable to report vision history complete. Seemed to nod head stating she did not wear glassess.) Vision Assessment?:  (Cognitive status limiting assessment today. Observe mroe in functional context.)                Pertinent Vitals/Pain Pain Assessment Pain Assessment: Faces Faces Pain Scale: Hurts little more Pain Location: B shoulders with P/ROM. Pain Descriptors / Indicators: Grimacing Pain Intervention(s): Limited activity within patient's tolerance, Monitored during session, Repositioned     Hand Dominance Right   Extremity/Trunk Assessment Upper Extremity Assessment Upper Extremity Assessment:  RUE deficits/detail;LUE deficits/detail RUE Deficits / Details: Limited to ~50% A/ROM of shoulder flexion and 75% of P/ROM with pain noted during abduction. Generally weak as well. LUE Deficits / Details: Limited to ~50% A/ROM of shoulder flexion and 50% of P/ROM with pain noted during flexion. Generally weak as well.   Lower Extremity Assessment Lower Extremity Assessment: Defer to PT evaluation   Cervical / Trunk Assessment Cervical / Trunk Assessment: Kyphotic   Communication Communication Communication: HOH   Cognition Arousal/Alertness: Awake/alert Behavior During Therapy: Flat affect Overall Cognitive Status: No family/caregiver present to determine baseline cognitive functioning                                 General Comments: Pt only verbalizing a couple times during session. Genrally flat affect without response to questions majority of the time.                      Home Living Family/patient expects to be discharged to:: Private residence Living Arrangements: Children Available Help at Discharge: Family;Available 24 hours/day Type of Home: House Home Access: Ramped entrance     Home Layout: One level     Bathroom Shower/Tub: Tub/shower unit;Sponge bathes at baseline   Bathroom Toilet: Handicapped height Bathroom Accessibility: Yes How Accessible: Accessible via wheelchair Home Equipment: Conservation officer, nature (2 wheels);Cane - single point;Transport chair   Additional Comments: Per chart review. Minimal communication by pt.  Prior Functioning/Environment Prior Level of Function : Needs assist       Physical Assist : Mobility (physical);ADLs (physical) Mobility (physical): Bed mobility;Transfers;Gait;Stairs   Mobility Comments: household ambulator using RW ADLs Comments: assisted by family (per chart review; pt unable to provide history.)        OT Problem List: Decreased strength;Decreased range of motion;Decreased activity  tolerance;Impaired balance (sitting and/or standing);Decreased cognition      OT Treatment/Interventions: Self-care/ADL training;Therapeutic exercise;Therapeutic activities;Patient/family education;Balance training    OT Goals(Current goals can be found in the care plan section) Acute Rehab OT Goals Patient Stated Goal: none stated OT Goal Formulation: Patient unable to participate in goal setting Time For Goal Achievement: 10/18/22 Potential to Achieve Goals: Fair  OT Frequency: Min 2X/week                                   End of Session Equipment Utilized During Treatment: Rolling walker (2 wheels);Gait belt Nurse Communication: Mobility status  Activity Tolerance: Patient tolerated treatment well Patient left: in chair;with call bell/phone within reach;with chair alarm set  OT Visit Diagnosis: Unsteadiness on feet (R26.81);Other abnormalities of gait and mobility (R26.89);Muscle weakness (generalized) (M62.81);History of falling (Z91.81);Other symptoms and signs involving cognitive function                Time: 0825-0850 OT Time Calculation (min): 25 min Charges:  OT General Charges $OT Visit: 1 Visit OT Evaluation $OT Eval Low Complexity: 1 Low  Sharilynn Cassity OT, MOT  Larey Seat 10/04/2022, 9:47 AM

## 2022-10-04 NOTE — TOC Initial Note (Signed)
Transition of Care [facility]) - Initial/Assessment Note    Patient Details  Name: [name] MRN: [id number] Date of Birth: 1927/07/09  Transition of Care [facility]) CM/SW Contact:    Shade Flood, LCSW Phone Number: 10/04/2022, 2:04 PM  Clinical Narrative:                  Pt admitted from home. She was discharged from [facility] about a week ago and went home with [facility] at that time. PT recommending SNF rehab. Spoke with pt's daughter to review.   Daughter states they are agreeable. CMS website provider options reviewed along with CMS star ratings. Daughter requests referral to [facility], [place], and [place].  Referral process started. TOC will follow.  Expected Discharge Plan: Skilled Nursing Facility Barriers to Discharge: Continued Medical Work up   Patient Goals and CMS Choice Patient states their goals for this hospitalization and ongoing recovery are:: rehab CMS Medicare.gov Compare Post Acute Care list provided to:: Patient Represenative (must comment) Choice offered to / list presented to : Adult Children  Expected Discharge Plan and Services Expected Discharge Plan: [place] In-house Referral: Clinical Social Work   Post Acute Care Choice: [place] Living arrangements for the past 2 months: [place]                                      Prior Living Arrangements/Services Living arrangements for the past 2 months: Single Family Home Lives with:: Self Patient language and need for interpreter reviewed:: Yes Do you feel safe going back to the place where you live?: Yes      Need for Family Participation in Patient Care: Yes (Comment) Care giver support system in place?: Yes (comment) Current home services: DME, Homehealth aide, Home PT, Home RN Criminal Activity/Legal Involvement Pertinent to Current Situation/Hospitalization: No - Comment as needed  Activities of Daily Living      Permission Sought/Granted Permission  sought to share information with : Facility Art therapist granted to share information with : Yes, Verbal Permission Granted     Permission granted to share info w AGENCY: snfs        Emotional Assessment       Orientation: : Oriented to Self, Oriented to Place, Oriented to  Time, Oriented to Situation Alcohol / Substance Use: Not Applicable Psych Involvement: No (comment)  Admission diagnosis:  Encephalopathy [G93.40] Sepsis secondary to UTI ([place]) [A41.9, N39.0] Urinary tract infection with hematuria, site unspecified [N39.0, R31.9] Sepsis, due to unspecified organism, unspecified whether acute organ dysfunction present ([place]) [A41.9] COVID-19 [U07.1] Patient Active Problem List   Diagnosis Date Noted   Fall at home, initial encounter 10/04/2022   Type 2 diabetes mellitus with hyperglycemia ([place]) 10/04/2022   Elevated lipase 10/04/2022   Hypoalbuminemia due to protein-calorie malnutrition ([place]) 10/04/2022   Mixed hyperlipidemia 10/04/2022   Sepsis secondary to UTI ([place]) 10/03/2022   Chronic diastolic CHF (congestive heart failure) ([place]) 09/24/2022   COVID-19 virus infection 09/20/2022   Diverticulitis of colon    Pressure injury of skin 08/18/2022   Chronic atrial fibrillation with RVR ([place])    Dyslipidemia    Type 2 diabetes mellitus ([place])    Hypomagnesemia 05/03/2022   Anemia of chronic disease 05/03/2022   B12 deficiency 05/03/2022   Hyponatremia 05/02/2022   Paroxysmal atrial fibrillation ([place]) 05/02/2022   UTI (urinary tract infection) 05/02/2022   Acute  metabolic encephalopathy 15/17/6160   Acute cystitis without hematuria 12/01/2021   Current use of long term anticoagulation 08/15/2021   Generalized weakness 08/15/2021   Hyperpotassemia 07/25/2017   Body mass index 29.0-29.9, adult 07/12/2017   Low back pain 07/12/2017   Cough 03/28/2017   Proteinuria 11/21/2016   Diarrhea 11/21/2016   Chronic kidney disease, stage III (moderate) (HCC)  11/19/2016   Benign paroxysmal positional vertigo 08/27/2016   Pure hypercholesterolemia 07/28/2014   Vascular insufficiency of intestine ([place]) 06/23/2014   Gouty arthropathy 06/11/2014   Obesity 07/24/2013   Osteoarthrosis 07/24/2013   Reflux esophagitis 07/15/2013   Coronary atherosclerosis of native coronary artery 10/13/2012   Essential hypertension, benign 09/12/2011   PSVT (paroxysmal supraventricular tachycardia) 08/19/2009   PCP:  Manon Hilding, MD Pharmacy:   [id number] [place], [place] [address] [place] [address] Phone: [phone] Fax: [phone]  [place], [address] [place] [address] [place] [address] Phone: [phone] Fax: [phone]     Social Determinants of Health (SDOH) Interventions    Readmission Risk Interventions     No data to display

## 2022-10-04 NOTE — Plan of Care (Signed)
  Problem: Acute Rehab OT Goals (only OT should resolve) Goal: Pt. Will Perform Eating Flowsheets (Taken 10/04/2022 0950) Pt Will Perform Eating:  with modified independence  sitting Goal: Pt. Will Perform Grooming Flowsheets (Taken 10/04/2022 0950) Pt Will Perform Grooming:  with supervision  sitting Goal: Pt. Will Perform Lower Body Bathing Flowsheets (Taken 10/04/2022 0950) Pt Will Perform Lower Body Bathing:  with min assist  with adaptive equipment Goal: Pt. Will Perform Upper Body Dressing Flowsheets (Taken 10/04/2022 0950) Pt Will Perform Upper Body Dressing:  with min assist  sitting Goal: Pt. Will Perform Lower Body Dressing Flowsheets (Taken 10/04/2022 0950) Pt Will Perform Lower Body Dressing:  with min assist  with mod assist  sitting/lateral leans  with adaptive equipment Goal: Pt. Will Transfer To Toilet Flowsheets (Taken 10/04/2022 0950) Pt Will Transfer to Toilet:  with min guard assist  stand pivot transfer Goal: Pt. Will Perform Toileting-Clothing Manipulation Flowsheets (Taken 10/04/2022 0950) Pt Will Perform Toileting - Clothing Manipulation and hygiene:  with min assist  sit to/from stand  sitting/lateral leans Goal: Pt/Caregiver Will Perform Home Exercise Program Flowsheets (Taken 10/04/2022 0950) Pt/caregiver will Perform Home Exercise Program:  Increased ROM  Increased strength  Both right and left upper extremity  With Supervision  Miroslava Santellan OT, MOT

## 2022-10-04 NOTE — Progress Notes (Signed)
PROGRESS NOTE     [name], is a 88 y.o. female, DOB - Nov 08, 1927, HMC:[id number]  Admit date - 10/03/2022   Admitting Physician Bernadette Hoit, DO  Outpatient Primary MD for the patient is Sasser, Silvestre Moment, MD  LOS - 1  Chief Complaint  Patient presents with   Fall        Brief Narrative:  88 y.o. female with medical history significant of hypertension, atrial fibrillation on Eliquis, T2DM, hyperlipidemia admitted on 10/03/2022 with concerns for sepsis secondary to UTI with altered mentation/acute metabolic encephalopathy    -Assessment and Plan: Sepsis secondary to UTI--POA -History of recurrent Klebsiella UTIs -Continue IV Rocephin pending culture data   Recent COVID-19 virus infection -Initially diagnosed on 09/20/2022 Patient was already treated with molnupiravir during last admission She was previously vaccinated against COVID and she got a new COVID booster on 09/17/22 Continue supportive care   Fall at home due to generalized weakness This is possibly secondary to patient's COVID and UTI. Continue fall precaution Continue PT/OT eval and treat   -Paroxysmal atrial fibrillation -Continue Eliquis and atenolol   DM2-A1c 6.2 reflecting excellent diabetic control PTA -Hold metformin Use Novolog/Humalog Sliding scale insulin with Accu-Cheks/Fingersticks as ordered   Acute metabolic encephalopathy--- secondary to UTI/sepsis as above  Hypomagnesemia--- replaced   Disposition/Need for in-Hospital Stay- patient unable to be discharged at this time due to sepsis secondary to UTI with altered mentation requiring IV antibiotics and IV fluids, weakness with falls requiring PT eval   Status is: Inpatient   Disposition: The patient is from: Home              Anticipated d/c is to:  Home with HH Vs SNF Rehab              Anticipated d/c date is: 2 days              Patient currently is not medically stable to d/c. Barriers: Not Clinically Stable-   Code Status :  -   Code Status: DNR   Family Communication:   Discussed with daughter at bedside   DVT Prophylaxis  :   - SCDs   SCDs Start: 10/04/22 0123 apixaban (ELIQUIS) tablet 5 mg   Lab Results  Component Value Date   PLT 337 10/04/2022    Inpatient Medications  Scheduled Meds:  apixaban  5 mg Oral BID   atenolol  100 mg Oral BID   feeding supplement (GLUCERNA SHAKE)  237 mL Oral TID BM   insulin aspart  0-9 Units Subcutaneous TID WC   simvastatin  10 mg Oral QHS   Continuous Infusions:  sodium chloride     cefTRIAXone (ROCEPHIN)  IV 1 g (10/04/22 1132)   PRN Meds:.acetaminophen **OR** acetaminophen, ondansetron **OR** ondansetron (ZOFRAN) IV   Anti-infectives (From admission, onward)    Start     Dose/Rate Route Frequency Ordered Stop   10/04/22 1000  cefTRIAXone (ROCEPHIN) 1 g in sodium chloride 0.9 % 100 mL IVPB        1 g 200 mL/hr over 30 Minutes Intravenous Every 24 hours 10/04/22 0247     10/04/22 0300  cefTRIAXone (ROCEPHIN) 1 g in sodium chloride 0.9 % 100 mL IVPB  Status:  Discontinued        1 g 200 mL/hr over 30 Minutes Intravenous Every 24 hours 10/04/22 0246 10/04/22 0247   10/03/22 2345  cefTRIAXone (ROCEPHIN) 1 g in sodium chloride 0.9 % 100 mL IVPB  1 g 200 mL/hr over 30 Minutes Intravenous  Once 10/03/22 2337 10/04/22 0018         Subjective: [name] today has no fevers, no emesis,  No chest pain,   Patient daughter at bedside, questions answered Poor appetite persist -Generalized weakness and lethargy persist  Objective: Vitals:   10/04/22 0334 10/04/22 0358 10/04/22 0400 10/04/22 1612  BP: (!) 152/86   (!) 176/87  Pulse: (!) 123 98  94  Resp: 16   20  Temp: 98.5 F (36.9 C)     TempSrc: Oral     SpO2: 97%   95%  Weight:   60.4 kg   Height:   5' (1.524 m)     Intake/Output Summary (Last 24 hours) at 10/04/2022 1912 Last data filed at 10/04/2022 0400 Gross per 24 hour  Intake 98.97 ml  Output --  Net 98.97 ml   Filed Weights    10/03/22 1946 10/04/22 0400  Weight: 61 kg 60.4 kg    Physical Exam  Gen:-Sleepy on and off, chronically ill and frail appearing HEENT:- [place].AT, No sclera icterus Neck-Supple Neck,No JVD,.  Lungs-  CTAB , fair symmetrical air movement CV- S1, S2 normal, irregular  Abd-  +ve B.Sounds, Abd Soft, No tenderness,    Extremity/Skin:- No  edema, pedal pulses present  Psych-Sleepy on and off, forgetful, memory and cognitive deficits noted  neuro-generalized weakness, no new focal deficits, no tremors  Data Reviewed: I have personally reviewed following labs and imaging studies  CBC: Recent Labs  Lab 10/03/22 1934 10/04/22 0120  WBC 15.7* 11.9*  NEUTROABS 14.0*  --   HGB 10.8* 9.7*  HCT 32.8* 30.4*  MCV 92.4 95.0  PLT 407* 979   Basic Metabolic Panel: Recent Labs  Lab 10/03/22 1934 10/04/22 0120  NA 132* 133*  K 4.2 3.7  CL 96* 98  CO2 25 25  GLUCOSE 207* 187*  BUN 21 20  CREATININE 0.85 0.82  CALCIUM 8.8* 8.6*  MG  --  1.2*  PHOS  --  3.1   GFR: Estimated Creatinine Clearance: 34.8 mL/min (by C-G formula based on SCr of 0.82 mg/dL). Liver Function Tests: Recent Labs  Lab 10/03/22 1934 10/04/22 0120  AST 13* 14*  ALT 14 14  ALKPHOS 52 50  BILITOT 0.9 1.0  PROT 6.7 6.7  ALBUMIN 3.2* 3.1*   Recent Results (from the past 240 hour(s))  Resp Panel by RT-PCR (Flu A&B, Covid) Anterior Nasal Swab     Status: Abnormal   Collection Time: 10/03/22  7:34 PM   Specimen: Anterior Nasal Swab  Result Value Ref Range Status   SARS Coronavirus 2 by RT PCR POSITIVE (A) NEGATIVE Final    Comment: (NOTE) SARS-CoV-2 target nucleic acids are DETECTED.  The SARS-CoV-2 RNA is generally detectable in upper respiratory specimens during the acute phase of infection. Positive results are indicative of the presence of the identified virus, but do not rule out bacterial infection or co-infection with other pathogens not detected by the test. Clinical correlation with patient history  and other diagnostic information is necessary to determine patient infection status. The expected result is Negative.  Fact Sheet for Patients: EntrepreneurPulse.com.au  Fact Sheet for Healthcare Providers: IncredibleEmployment.be  This test is not yet approved or cleared by the [place] FDA and  has been authorized for detection and/or diagnosis of SARS-CoV-2 by FDA under an Emergency Use Authorization (EUA).  This EUA will remain in effect (meaning this test can be used) for the  duration of  the COVID-19 declaration under Section 564(b)(1) of the A ct, 21 U.S.C. section 360bbb-3(b)(1), unless the authorization is terminated or revoked sooner.     Influenza A by PCR NEGATIVE NEGATIVE Final   Influenza B by PCR NEGATIVE NEGATIVE Final    Comment: (NOTE) The Xpert Xpress SARS-CoV-2/FLU/RSV plus assay is intended as an aid in the diagnosis of influenza from Nasopharyngeal swab specimens and should not be used as a sole basis for treatment. Nasal washings and aspirates are unacceptable for Xpert Xpress SARS-CoV-2/FLU/RSV testing.  Fact Sheet for Patients: EntrepreneurPulse.com.au  Fact Sheet for Healthcare Providers: IncredibleEmployment.be  This test is not yet approved or cleared by the [place] FDA and has been authorized for detection and/or diagnosis of SARS-CoV-2 by FDA under an Emergency Use Authorization (EUA). This EUA will remain in effect (meaning this test can be used) for the duration of the COVID-19 declaration under Section 564(b)(1) of the Act, 21 U.S.C. section 360bbb-3(b)(1), unless the authorization is terminated or revoked.  Performed at [facility], [address], [place], [place] [address]   Culture, blood (Routine X 2) w Reflex to ID Panel     Status: None (Preliminary result)   Collection Time: 10/04/22  1:20 AM   Specimen: BLOOD RIGHT WRIST  Result Value Ref Range  Status   Specimen Description BLOOD RIGHT WRIST  Final   Special Requests   Final    BOTTLES DRAWN AEROBIC AND ANAEROBIC Blood Culture results may not be optimal due to an inadequate volume of blood received in culture bottles Performed at [facility], [address] [place], [place] [address]    Culture PENDING  Incomplete   Report Status PENDING  Incomplete  Culture, blood (Routine X 2) w Reflex to ID Panel     Status: None (Preliminary result)   Collection Time: 10/04/22  1:20 AM   Specimen: BLOOD RIGHT HAND  Result Value Ref Range Status   Specimen Description BLOOD RIGHT HAND  Final   Special Requests   Final    BOTTLES DRAWN AEROBIC AND ANAEROBIC Blood Culture results may not be optimal due to an inadequate volume of blood received in culture bottles Performed at [facility], [address], [place], [place] [address]    Culture PENDING  Incomplete   Report Status PENDING  Incomplete    Radiology Studies: DG Wrist Complete Left  Result Date: 10/03/2022 CLINICAL DATA:  fall EXAM: LEFT WRIST - COMPLETE 3+ VIEW COMPARISON:  None Available. FINDINGS: Diffusely decreased bone density. There is no evidence of fracture or dislocation. Chondrocalcinosis. Radiocarpal and ulnocarpal degenerative changes. Scaphoid trapezoid and trapezium joint degenerative changes. Subcutaneus soft tissue edema of the wrist. IMPRESSION: No acute displaced fracture or dislocation. Electronically Signed   By: Iven Finn M.D.   On: 10/03/2022 22:02   CT Head Wo Contrast  Result Date: 10/03/2022 CLINICAL DATA:  Delirium, fall EXAM: CT HEAD WITHOUT CONTRAST TECHNIQUE: Contiguous axial images were obtained from the base of the skull through the vertex without intravenous contrast. RADIATION DOSE REDUCTION: This exam was performed according to the departmental dose-optimization program which includes automated exposure control, adjustment of the mA and/or kV according to patient size and/or use of iterative  reconstruction technique. COMPARISON:  09/20/2022 FINDINGS: Brain: No evidence of acute infarction, hemorrhage, hydrocephalus, extra-axial collection or mass lesion/mass effect. Patchy low-density changes within the periventricular and subcortical white matter compatible with chronic microvascular ischemic change. Mild diffuse cerebral volume loss. Vascular: Atherosclerotic calcifications involving the large vessels  of the skull base. No unexpected hyperdense vessel. Skull: Normal. Negative for fracture or focal lesion. Sinuses/Orbits: No acute finding. Other: Negative for scalp hematoma. IMPRESSION: 1. No acute intracranial findings. 2. Chronic microvascular ischemic change and cerebral volume loss. Electronically Signed   By: Davina Poke D.O.   On: 10/03/2022 21:59   DG Pelvis 1-2 Views  Result Date: 10/03/2022 CLINICAL DATA:  fall EXAM: PELVIS - 1-2 VIEW COMPARISON:  X-ray pelvis 08/06/2021 FINDINGS: There is no evidence of pelvic fracture or diastasis. Dislocation of the hips. No pelvic bone lesions are seen. IMPRESSION: Negative for acute traumatic injury. Electronically Signed   By: Iven Finn M.D.   On: 10/03/2022 21:59   DG Chest Portable 1 View  Result Date: 10/03/2022 CLINICAL DATA:  fall EXAM: PORTABLE CHEST 1 VIEW COMPARISON:  Chest x-ray 09/20/2022 FINDINGS: The heart and mediastinal contours are unchanged. Aortic calcification. Low lung volumes. No focal consolidation. Chronic coarsened interstitial markings with no overt pulmonary edema. No pleural effusion. No pneumothorax. No acute osseous abnormality. Bilateral shoulder degenerative changes. IMPRESSION: 1. Low lung volumes with no active disease. 2.  Aortic Atherosclerosis (ICD10-I70.0). Electronically Signed   By: Iven Finn M.D.   On: 10/03/2022 21:58     Scheduled Meds:  apixaban  5 mg Oral BID   atenolol  100 mg Oral BID   feeding supplement (GLUCERNA SHAKE)  237 mL Oral TID BM   insulin aspart  0-9 Units  Subcutaneous TID WC   simvastatin  10 mg Oral QHS   Continuous Infusions:  sodium chloride     cefTRIAXone (ROCEPHIN)  IV 1 g (10/04/22 1132)    LOS: 1 day    Roxan Hockey M.D on 10/04/2022 at 7:12 PM  Go to [email or web address] - for contact info  [facility] - Office  [phone]  If 7PM-7AM, please contact night-coverage [email or web address] 10/04/2022, 7:12 PM

## 2022-10-04 NOTE — Plan of Care (Signed)
  Problem: Acute Rehab PT Goals(only PT should resolve) Goal: Patient Will Transfer Sit To/From Stand Outcome: Progressing Flowsheets (Taken 10/04/2022 1036) Patient will transfer sit to/from stand: with minimal assist Goal: Pt Will Transfer Bed To Chair/Chair To Bed Outcome: Progressing Flowsheets (Taken 10/04/2022 1036) Pt will Transfer Bed to Chair/Chair to Bed: with min assist Goal: Pt Will Ambulate Outcome: Progressing Flowsheets (Taken 10/04/2022 1036) Pt will Ambulate:  25 feet  with minimal assist  with rolling walker Goal: Pt/caregiver will Perform Home Exercise Program Outcome: Progressing Flowsheets (Taken 10/04/2022 1036) Pt/caregiver will Perform Home Exercise Program:  For increased strengthening  For improved balance  Independently  10:36 AM, 10/04/22 Mearl Latin PT, DPT Physical Therapist at [facility]

## 2022-10-04 NOTE — Progress Notes (Signed)
Patient arrived to floor, with daughter, alert to self.  Patient sacral and buttock area was WNL. Patient did have bruising to foot and to right upper arm.

## 2022-10-04 NOTE — Evaluation (Signed)
Physical Therapy Evaluation Patient Details Name: [name] MRN: [id number] DOB: 06-Sep-1927 Today's Date: 10/04/2022  History of Present Illness  [name] is a 88 y.o. female with medical history significant of hypertension, atrial fibrillation on Eliquis, T2DM, hyperlipidemia who presents emergency department via EMS due to fall at home.  Patient was somnolent at bedside, she was easily arousable, but was unable to provide a history.  History was obtained from ED physician and daughter at bedside.  Per report, patient has 3-day onset of confusion whereby she sometimes does not recognize the daughter [name] with her and POA), unable to carry on conversation as she usually does at baseline.  She was also reported to have had 3-day onset of discomfort on urination including burning sensation on urination.  Low-grade fever at home and increased heart rate within past 24 hours PTA.  Patient recently completed Keflex due to prior UTI, current symptoms started shortly after completing the antibiotics.    Patient wanted to go to the restroom at home, daughter assisted her while she was ambulating with a walker.  However, due to weakness and fatigue, she lost balance and fell, but daughter was able to support her and prevent her from hitting her head.  Patient was recently admitted from 11/2 to 11/6 due to acute metabolic encephalopathy secondary to UTI and COVID-19 infection   Clinical Impression  Patient limited for functional mobility as stated below secondary to BLE weakness, fatigue and poor standing balance.  Patient seated in chair at beginning of session and is lethargic but arouses with frequent cueing. Patient does not answer most questions asked and info below regarding PLOF/ home set up is taken from prior admission. She requires frequent cueing and mod/max assist to transfer to standing with RW. She demonstrates poor standing balance and tolerance and returns to sitting in chair at end of  session. Patient will benefit from continued physical therapy in hospital and recommended venue below to increase strength, balance, endurance for safe ADLs and gait.        Recommendations for follow up therapy are one component of a multi-disciplinary discharge planning process, led by the attending physician.  Recommendations may be updated based on patient status, additional functional criteria and insurance authorization.  Follow Up Recommendations Skilled nursing-short term rehab (<3 hours/day) Can patient physically be transported by private vehicle: No    Assistance Recommended at Discharge Frequent or constant Supervision/Assistance  Patient can return home with the following  A lot of help with walking and/or transfers;A lot of help with bathing/dressing/bathroom    Equipment Recommendations None recommended by PT  Recommendations for Other Services       Functional Status Assessment Patient has had a recent decline in their functional status and demonstrates the ability to make significant improvements in function in a reasonable and predictable amount of time.     Precautions / Restrictions Precautions Precautions: Fall Restrictions Weight Bearing Restrictions: No      Mobility  Bed Mobility               General bed mobility comments: seated in chair at beginning of session    Transfers Overall transfer level: Needs assistance Equipment used: Rolling walker (2 wheels) Transfers: Sit to/from Stand Sit to Stand: Mod assist, Max assist           General transfer comment: assist and cueing to transfer to standing with RW    Ambulation/Gait  Stairs            Wheelchair Mobility    Modified Rankin (Stroke Patients Only)       Balance     Sitting balance-Leahy Scale: Poor Sitting balance - Comments: seated in chair   Standing balance support: Reliant on assistive device for balance Standing balance-Leahy  Scale: Zero Standing balance comment: using RW                             Pertinent Vitals/Pain Pain Assessment Faces Pain Scale: Hurts little more Pain Location: shoulders, bilateral quad regions Pain Descriptors / Indicators: Grimacing, Guarding Pain Intervention(s): Limited activity within patient's tolerance, Monitored during session, Repositioned    Home Living Family/patient expects to be discharged to:: Private residence Living Arrangements: Children Available Help at Discharge: Family;Available 24 hours/day Type of Home: House Home Access: Ramped entrance       Home Layout: One level Home Equipment: Conservation officer, nature (2 wheels);Cane - single point;Transport chair Additional Comments: information from prior admission    Prior Function Prior Level of Function : Needs assist       Physical Assist : Mobility (physical);ADLs (physical) Mobility (physical): Bed mobility;Transfers;Gait;Stairs   Mobility Comments: household ambulator using RW ADLs Comments: assisted by family     Hand Dominance   Dominant Hand: Right    Extremity/Trunk Assessment   Upper Extremity Assessment Upper Extremity Assessment: Defer to OT evaluation RUE Deficits / Details: Limited to ~50% A/ROM of shoulder flexion and 75% of P/ROM with pain noted during abduction. Generally weak as well. LUE Deficits / Details: Limited to ~50% A/ROM of shoulder flexion and 50% of P/ROM with pain noted during flexion. Generally weak as well.    Lower Extremity Assessment Lower Extremity Assessment: Generalized weakness;Difficult to assess due to impaired cognition    Cervical / Trunk Assessment Cervical / Trunk Assessment: Kyphotic  Communication   Communication: HOH  Cognition Arousal/Alertness: Awake/alert, Lethargic Behavior During Therapy: Flat affect Overall Cognitive Status: No family/caregiver present to determine baseline cognitive functioning                                  General Comments: Pt only verbalizing a couple times during session. Genrally flat affect without response to questions majority of the time.        General Comments      Exercises     Assessment/Plan    PT Assessment Patient needs continued PT services  PT Problem List Decreased strength;Decreased activity tolerance;Decreased balance;Decreased mobility       PT Treatment Interventions DME instruction;Gait training;Stair training;Functional mobility training;Therapeutic activities;Therapeutic exercise;Patient/family education;Balance training    PT Goals (Current goals can be found in the Care Plan section)  Acute Rehab PT Goals Patient Stated Goal: return home PT Goal Formulation: With patient Time For Goal Achievement: 10/18/22 Potential to Achieve Goals: Good    Frequency Min 3X/week     Co-evaluation               AM-PAC PT "6 Clicks" Mobility  Outcome Measure Help needed turning from your back to your side while in a flat bed without using bedrails?: A Little Help needed moving from lying on your back to sitting on the side of a flat bed without using bedrails?: A Lot Help needed moving to and from a bed to a chair (including a wheelchair)?: A Lot Help  needed standing up from a chair using your arms (e.g., wheelchair or bedside chair)?: A Lot Help needed to walk in hospital room?: A Lot Help needed climbing 3-5 steps with a railing? : A Lot 6 Click Score: 13    End of Session Equipment Utilized During Treatment: Gait belt Activity Tolerance: Patient limited by fatigue Patient left: in chair;with call bell/phone within reach;with chair alarm set Nurse Communication: Mobility status PT Visit Diagnosis: Unsteadiness on feet (R26.81);Other abnormalities of gait and mobility (R26.89);Muscle weakness (generalized) (M62.81);History of falling (Z91.81)    Time: 1009-1020 PT Time Calculation (min) (ACUTE ONLY): 11 min   Charges:   PT Evaluation $PT  Eval Moderate Complexity: 1 Mod          10:35 AM, 10/04/22 Mearl Latin PT, DPT Physical Therapist at [facility]

## 2022-10-04 NOTE — ED Notes (Signed)
[name] requested an ice pack to be applied to pt's right arm from the blood draws. Ice pack applied

## 2022-10-04 NOTE — NC FL2 (Signed)
[place] MEDICAID FL2 LEVEL OF CARE SCREENING TOOL     IDENTIFICATION  Patient Name: [name] Birthdate: [birth year] Sex: female Admission Date (Current Location): 10/03/2022  [facility] and [place] Number:  [employer] and Address:  [facility] [address], [place]      Provider Number: [id number]  Attending Physician Name and Address:  Roxan Hockey, MD  Relative Name and Phone Number:       Current Level of Care: Hospital Recommended Level of Care: [place] Prior Approval Number:    Date Approved/Denied:   PASRR Number: [id number]  Discharge Plan: SNF    Current Diagnoses: Patient Active Problem List   Diagnosis Date Noted   Fall at home, initial encounter 10/04/2022   Type 2 diabetes mellitus with hyperglycemia ([place]) 10/04/2022   Elevated lipase 10/04/2022   Hypoalbuminemia due to protein-calorie malnutrition ([place]) 10/04/2022   Mixed hyperlipidemia 10/04/2022   Sepsis secondary to UTI ([place]) 10/03/2022   Chronic diastolic CHF (congestive heart failure) ([place]) 09/24/2022   COVID-19 virus infection 09/20/2022   Diverticulitis of colon    Pressure injury of skin 08/18/2022   Chronic atrial fibrillation with RVR ([place])    Dyslipidemia    Type 2 diabetes mellitus ([place])    Hypomagnesemia 05/03/2022   Anemia of chronic disease 05/03/2022   B12 deficiency 05/03/2022   Hyponatremia 05/02/2022   Paroxysmal atrial fibrillation ([place]) 05/02/2022   UTI (urinary tract infection) 41/66/0630   Acute metabolic encephalopathy 16/11/930   Acute cystitis without hematuria 12/01/2021   Current use of long term anticoagulation 08/15/2021   Generalized weakness 08/15/2021   Hyperpotassemia 07/25/2017   Body mass index 29.0-29.9, adult 07/12/2017   Low back pain 07/12/2017   Cough 03/28/2017   Proteinuria 11/21/2016   Diarrhea 11/21/2016   Chronic kidney disease, stage III (moderate) ([place]) 11/19/2016   Benign  paroxysmal positional vertigo 08/27/2016   Pure hypercholesterolemia 07/28/2014   Vascular insufficiency of intestine ([place]) 06/23/2014   Gouty arthropathy 06/11/2014   Obesity 07/24/2013   Osteoarthrosis 07/24/2013   Reflux esophagitis 07/15/2013   Coronary atherosclerosis of native coronary artery 10/13/2012   Essential hypertension, benign 09/12/2011   PSVT (paroxysmal supraventricular tachycardia) 08/19/2009    Orientation RESPIRATION BLADDER Height & Weight     Self, Time, Situation, Place  Normal Incontinent Weight: 133 lb 2.5 oz (60.4 kg) Height:  5' (152.4 cm)  BEHAVIORAL SYMPTOMS/MOOD NEUROLOGICAL BOWEL NUTRITION STATUS      Incontinent Diet (see dc summary)  AMBULATORY STATUS COMMUNICATION OF NEEDS Skin   Extensive Assist Verbally Normal                       Personal Care Assistance Level of Assistance    Bathing Assistance: Maximum assistance Feeding assistance: Limited assistance Dressing Assistance: Maximum assistance     Functional Limitations Info    Sight Info: Adequate Hearing Info: Adequate Speech Info: Adequate    SPECIAL CARE FACTORS FREQUENCY  PT (By licensed PT), OT (By licensed OT)     PT Frequency: 5x week OT Frequency: 3x week            Contractures Contractures Info: Not present    Additional Factors Info    Code Status Info: DNR Allergies Info: Toprol Xl           Current Medications (10/04/2022):  This is the current hospital active medication list Current Facility-Administered Medications  Medication Dose Route Frequency Provider Last Rate  Last Admin   acetaminophen (TYLENOL) tablet 650 mg  650 mg Oral Q6H PRN Adefeso, Oladapo, DO       Or   acetaminophen (TYLENOL) suppository 650 mg  650 mg Rectal Q6H PRN Adefeso, Oladapo, DO       apixaban (ELIQUIS) tablet 5 mg  5 mg Oral BID Adefeso, Oladapo, DO   5 mg at 10/04/22 1122   atenolol (TENORMIN) tablet 100 mg  100 mg Oral BID Adefeso, Oladapo, DO   100 mg at 10/04/22  1122   cefTRIAXone (ROCEPHIN) 1 g in sodium chloride 0.9 % 100 mL IVPB  1 g Intravenous Q24H Adefeso, Oladapo, DO 200 mL/hr at 10/04/22 1132 1 g at 10/04/22 1132   feeding supplement (GLUCERNA SHAKE) (GLUCERNA SHAKE) liquid 237 mL  237 mL Oral TID BM Adefeso, Oladapo, DO       insulin aspart (novoLOG) injection 0-9 Units  0-9 Units Subcutaneous TID WC Adefeso, Oladapo, DO   2 Units at 10/04/22 1154   ondansetron (ZOFRAN) tablet 4 mg  4 mg Oral Q6H PRN Adefeso, Oladapo, DO       Or   ondansetron (ZOFRAN) injection 4 mg  4 mg Intravenous Q6H PRN Adefeso, Oladapo, DO       simvastatin (ZOCOR) tablet 10 mg  10 mg Oral QHS Adefeso, Oladapo, DO   10 mg at 10/04/22 0149     Discharge Medications: Please see discharge summary for a list of discharge medications.  Relevant Imaging Results:  Relevant Lab Results:   Additional Information SSN: [id number] (Initial positive covid test on 09/20/22 so she is out of the 11 day window)  Shade Flood, LCSW

## 2022-10-04 NOTE — Sepsis Progress Note (Addendum)
Notified provider and bedside nurse of need to order blood cultures via secure chat.. Blood cultures were ordered, show preliminary results in lab at 0131. Antibiotics were not delayed, given prior to obtaining blood cultures

## 2022-10-05 DIAGNOSIS — A419 Sepsis, unspecified organism: Secondary | ICD-10-CM | POA: Diagnosis not present

## 2022-10-05 DIAGNOSIS — N390 Urinary tract infection, site not specified: Secondary | ICD-10-CM | POA: Diagnosis not present

## 2022-10-05 LAB — GLUCOSE, CAPILLARY
Glucose-Capillary: 176 mg/dL — ABNORMAL HIGH (ref 70–99)
Glucose-Capillary: 286 mg/dL — ABNORMAL HIGH (ref 70–99)
Glucose-Capillary: 140 mg/dL — ABNORMAL HIGH (ref 70–99)
Glucose-Capillary: 179 mg/dL — ABNORMAL HIGH (ref 70–99)

## 2022-10-05 MED ORDER — SODIUM CHLORIDE 0.9 % IV SOLN: INTRAVENOUS | Status: AC

## 2022-10-05 NOTE — Progress Notes (Signed)
Physical Therapy Treatment Patient Details Name: [name] MRN: [id number] DOB: [birth year] Today's Date: 10/05/2022   History of Present Illness [name] is a 88 y.o. female with medical history significant of hypertension, atrial fibrillation on Eliquis, T2DM, hyperlipidemia who presents emergency department via EMS due to fall at home.  Patient was somnolent at bedside, she was easily arousable, but was unable to provide a history.  History was obtained from ED physician and daughter at bedside.  Per report, patient has 3-day onset of confusion whereby she sometimes does not recognize the daughter [name] with her and POA), unable to carry on conversation as she usually does at baseline.  She was also reported to have had 3-day onset of discomfort on urination including burning sensation on urination.  Low-grade fever at home and increased heart rate within past 24 hours PTA.  Patient recently completed Keflex due to prior UTI, current symptoms started shortly after completing the antibiotics.    Patient wanted to go to the restroom at home, daughter assisted her while she was ambulating with a walker.  However, due to weakness and fatigue, she lost balance and fell, but daughter was able to support her and prevent her from hitting her head.  Patient was recently admitted from 11/2 to 11/6 due to acute metabolic encephalopathy secondary to UTI and COVID-19 infection    PT Comments    Patient with improved participation ability today. She requires increased time and assist to transition to seated EOB. She initially requires assist for seated balance but is able to maintain with cueing for UE use. She completes seated exercises with good mechanics. Patient requires mod assist and use of RW to transfer to standing and is limited to very small, slow steps at bedside to chair. Patient will benefit from continued skilled physical therapy in hospital and recommended venue below to increase strength,  balance, endurance for safe ADLs and gait.   Recommendations for follow up therapy are one component of a multi-disciplinary discharge planning process, led by the attending physician.  Recommendations may be updated based on patient status, additional functional criteria and insurance authorization.  Follow Up Recommendations  Skilled nursing-short term rehab (<3 hours/day) Can patient physically be transported by private vehicle: No   Assistance Recommended at Discharge Frequent or constant Supervision/Assistance  Patient can return home with the following A lot of help with walking and/or transfers;A lot of help with bathing/dressing/bathroom   Equipment Recommendations  None recommended by PT    Recommendations for Other Services       Precautions / Restrictions Precautions Precautions: Fall Restrictions Weight Bearing Restrictions: No     Mobility  Bed Mobility Overal bed mobility: Needs Assistance Bed Mobility: Supine to Sit     Supine to sit: Mod assist, Min assist     General bed mobility comments: requires increased time, cueing for sequencing and assist to pull to seated EOB    Transfers Overall transfer level: Needs assistance Equipment used: Rolling walker (2 wheels) Transfers: Sit to/from Stand Sit to Stand: Mod assist   Step pivot transfers: Mod assist       General transfer comment: assist and cueing to transfer to standing with RW    Ambulation/Gait Ambulation/Gait assistance: Mod assist Gait Distance (Feet): 2 Feet Assistive device: Rolling walker (2 wheels) Gait Pattern/deviations: Decreased step length - right, Decreased step length - left, Decreased stride length, Trunk flexed, Shuffle Gait velocity: decreased     General Gait Details: very slow, small steps  with RW   Stairs             Wheelchair Mobility    Modified Rankin (Stroke Patients Only)       Balance Overall balance assessment: Needs assistance Sitting-balance  support: Feet supported, No upper extremity supported Sitting balance-Leahy Scale: Fair Sitting balance - Comments: seated at EOB   Standing balance support: During functional activity, Reliant on assistive device for balance, Bilateral upper extremity supported Standing balance-Leahy Scale: Poor Standing balance comment: using RW                            Cognition Arousal/Alertness: Awake/alert Behavior During Therapy: WFL for tasks assessed/performed Overall Cognitive Status: Within Functional Limits for tasks assessed                                          Exercises General Exercises - Lower Extremity Ankle Circles/Pumps: AROM, Both, 10 reps, Seated Long Arc Quad: AROM, Both, 10 reps, Seated Hip Flexion/Marching: AROM, Both, 10 reps, Seated    General Comments        Pertinent Vitals/Pain Pain Assessment Pain Assessment: No/denies pain    Home Living                          Prior Function            PT Goals (current goals can now be found in the care plan section) Acute Rehab PT Goals Patient Stated Goal: return home PT Goal Formulation: With patient Time For Goal Achievement: 10/18/22 Potential to Achieve Goals: Good Progress towards PT goals: Progressing toward goals    Frequency    Min 3X/week      PT Plan Current plan remains appropriate    Co-evaluation PT/OT/SLP Co-Evaluation/Treatment: Yes Reason for Co-Treatment: To address functional/ADL transfers PT goals addressed during session: Mobility/safety with mobility;Balance;Proper use of DME;Strengthening/ROM        AM-PAC PT "6 Clicks" Mobility   Outcome Measure  Help needed turning from your back to your side while in a flat bed without using bedrails?: A Little Help needed moving from lying on your back to sitting on the side of a flat bed without using bedrails?: A Lot Help needed moving to and from a bed to a chair (including a wheelchair)?: A  Lot Help needed standing up from a chair using your arms (e.g., wheelchair or bedside chair)?: A Lot Help needed to walk in hospital room?: A Lot Help needed climbing 3-5 steps with a railing? : A Lot 6 Click Score: 13    End of Session Equipment Utilized During Treatment: Gait belt Activity Tolerance: Patient limited by fatigue Patient left: in chair;with call bell/phone within reach;with chair alarm set Nurse Communication: Mobility status PT Visit Diagnosis: Unsteadiness on feet (R26.81);Other abnormalities of gait and mobility (R26.89);Muscle weakness (generalized) (M62.81);History of falling (Z91.81)     Time: 1610-9604 PT Time Calculation (min) (ACUTE ONLY): 34 min  Charges:  $Therapeutic Activity: 8-22 mins                     9:55 AM, 10/05/22 Mearl Latin PT, DPT Physical Therapist at [facility]

## 2022-10-05 NOTE — Progress Notes (Signed)
PROGRESS NOTE     [name], is a 88 y.o. female, DOB - 1927/07/29, IOE:[id number]  Admit date - 10/03/2022   Admitting Physician Bernadette Hoit, DO  Outpatient Primary MD for the patient is Sasser, Silvestre Moment, MD  LOS - 2  Chief Complaint  Patient presents with   Fall        Brief Narrative:  88 y.o. female with medical history significant of hypertension, atrial fibrillation on Eliquis, T2DM, hyperlipidemia admitted on 10/03/2022 with concerns for sepsis secondary to UTI with altered mentation/acute metabolic encephalopathy    -Assessment and Plan: Sepsis secondary to Klebsiella UTI--POA -Prior History of recurrent Klebsiella UTIs -Continue IV Rocephin pending sensitivity report   Recent COVID-19 virus infection -Initially diagnosed on 09/20/2022 Patient was already treated with molnupiravir during last admission She was previously vaccinated against COVID and she got a new COVID booster on 09/17/22 Continue supportive care   Fall at home due to generalized weakness This is possibly secondary to patient's COVID and UTI. Continue fall precaution -PT eval appreciated recommends SNF rehab   -Paroxysmal atrial fibrillation -Continue Eliquis and atenolol   DM2-A1c 6.2 reflecting excellent diabetic control PTA -Hold metformin Use Novolog/Humalog Sliding scale insulin with Accu-Cheks/Fingersticks as ordered   Acute metabolic encephalopathy--- secondary to UTI/sepsis as above -Mentation improving slowly  Hypomagnesemia--- replaced   Disposition/Need for in-Hospital Stay- patient unable to be discharged at this time due to sepsis secondary to UTI with altered mentation requiring IV antibiotics and IV fluids, weakness with falls -physical therapist recommends SNF rehab  Status is: Inpatient   Disposition: The patient is from: Home              Anticipated d/c is to:  Home with HH Vs SNF Rehab              Anticipated d/c date is: 2 days              Patient currently is  not medically stable to d/c. Barriers: Not Clinically Stable-   Code Status :  -  Code Status: DNR   Family Communication:   Discussed with daughter at bedside   DVT Prophylaxis  :   - SCDs   SCDs Start: 10/04/22 0123 apixaban (ELIQUIS) tablet 5 mg   Lab Results  Component Value Date   PLT 337 10/04/2022    Inpatient Medications  Scheduled Meds:  apixaban  5 mg Oral BID   atenolol  100 mg Oral BID   feeding supplement (GLUCERNA SHAKE)  237 mL Oral TID BM   insulin aspart  0-9 Units Subcutaneous TID WC   simvastatin  10 mg Oral QHS   Continuous Infusions:  sodium chloride     cefTRIAXone (ROCEPHIN)  IV 1 g (10/05/22 0851)   PRN Meds:.acetaminophen **OR** acetaminophen, ondansetron **OR** ondansetron (ZOFRAN) IV   Anti-infectives (From admission, onward)    Start     Dose/Rate Route Frequency Ordered Stop   10/04/22 1000  cefTRIAXone (ROCEPHIN) 1 g in sodium chloride 0.9 % 100 mL IVPB        1 g 200 mL/hr over 30 Minutes Intravenous Every 24 hours 10/04/22 0247     10/04/22 0300  cefTRIAXone (ROCEPHIN) 1 g in sodium chloride 0.9 % 100 mL IVPB  Status:  Discontinued        1 g 200 mL/hr over 30 Minutes Intravenous Every 24 hours 10/04/22 0246 10/04/22 0247   10/03/22 2345  cefTRIAXone (ROCEPHIN) 1 g in sodium chloride 0.9 % 100  mL IVPB        1 g 200 mL/hr over 30 Minutes Intravenous  Once 10/03/22 2337 10/04/22 0018         Subjective: [name] today has no fevers, no emesis,  No chest pain,   Patient daughter at bedside, questions answered Oral intake is Not great  Objective: Vitals:   10/05/22 0603 10/05/22 1417 10/05/22 1646 10/05/22 1827  BP: (!) 151/79 (!) 158/85 125/66 135/69  Pulse: 92 93 93 87  Resp: 18 (!) 30 (!) 26 (!) 28  Temp: 97.8 F (36.6 C) (!) 100.6 F (38.1 C) 99.2 F (37.3 C) 98.2 F (36.8 C)  TempSrc:      SpO2: 97% 98% 96% 97%  Weight:      Height:        Intake/Output Summary (Last 24 hours) at 10/05/2022 2106 Last data filed  at 10/05/2022 1822 Gross per 24 hour  Intake 1068.78 ml  Output 550 ml  Net 518.78 ml   Filed Weights   10/03/22 1946 10/04/22 0400  Weight: 61 kg 60.4 kg    Physical Exam  Gen:-less sleepy, chronically ill and frail appearing HEENT:- [place].AT, No sclera icterus Neck-Supple Neck,No JVD,.  Lungs-  CTAB , fair symmetrical air movement CV- S1, S2 normal, irregular  Abd-  +ve B.Sounds, Abd Soft, No tenderness, no CVA tenderness Extremity/Skin:- No  edema, pedal pulses present  Psych-more awake, memory and cognitive deficits noted  neuro-generalized weakness, no new focal deficits, no tremors  Data Reviewed: I have personally reviewed following labs and imaging studies  CBC: Recent Labs  Lab 10/03/22 1934 10/04/22 0120  WBC 15.7* 11.9*  NEUTROABS 14.0*  --   HGB 10.8* 9.7*  HCT 32.8* 30.4*  MCV 92.4 95.0  PLT 407* 676   Basic Metabolic Panel: Recent Labs  Lab 10/03/22 1934 10/04/22 0120  NA 132* 133*  K 4.2 3.7  CL 96* 98  CO2 25 25  GLUCOSE 207* 187*  BUN 21 20  CREATININE 0.85 0.82  CALCIUM 8.8* 8.6*  MG  --  1.2*  PHOS  --  3.1   GFR: Estimated Creatinine Clearance: 34.8 mL/min (by C-G formula based on SCr of 0.82 mg/dL). Liver Function Tests: Recent Labs  Lab 10/03/22 1934 10/04/22 0120  AST 13* 14*  ALT 14 14  ALKPHOS 52 50  BILITOT 0.9 1.0  PROT 6.7 6.7  ALBUMIN 3.2* 3.1*   Recent Results (from the past 240 hour(s))  Resp Panel by RT-PCR (Flu A&B, Covid) Anterior Nasal Swab     Status: Abnormal   Collection Time: 10/03/22  7:34 PM   Specimen: Anterior Nasal Swab  Result Value Ref Range Status   SARS Coronavirus 2 by RT PCR POSITIVE (A) NEGATIVE Final    Comment: (NOTE) SARS-CoV-2 target nucleic acids are DETECTED.  The SARS-CoV-2 RNA is generally detectable in upper respiratory specimens during the acute phase of infection. Positive results are indicative of the presence of the identified virus, but do not rule out bacterial infection or  co-infection with other pathogens not detected by the test. Clinical correlation with patient history and other diagnostic information is necessary to determine patient infection status. The expected result is Negative.  Fact Sheet for Patients: EntrepreneurPulse.com.au  Fact Sheet for Healthcare Providers: IncredibleEmployment.be  This test is not yet approved or cleared by the [place] FDA and  has been authorized for detection and/or diagnosis of SARS-CoV-2 by FDA under an Emergency Use Authorization (EUA).  This EUA will  remain in effect (meaning this test can be used) for the duration of  the COVID-19 declaration under Section 564(b)(1) of the A ct, 21 U.S.C. section 360bbb-3(b)(1), unless the authorization is terminated or revoked sooner.     Influenza A by PCR NEGATIVE NEGATIVE Final   Influenza B by PCR NEGATIVE NEGATIVE Final    Comment: (NOTE) The Xpert Xpress SARS-CoV-2/FLU/RSV plus assay is intended as an aid in the diagnosis of influenza from Nasopharyngeal swab specimens and should not be used as a sole basis for treatment. Nasal washings and aspirates are unacceptable for Xpert Xpress SARS-CoV-2/FLU/RSV testing.  Fact Sheet for Patients: EntrepreneurPulse.com.au  Fact Sheet for Healthcare Providers: IncredibleEmployment.be  This test is not yet approved or cleared by the [place] FDA and has been authorized for detection and/or diagnosis of SARS-CoV-2 by FDA under an Emergency Use Authorization (EUA). This EUA will remain in effect (meaning this test can be used) for the duration of the COVID-19 declaration under Section 564(b)(1) of the Act, 21 U.S.C. section 360bbb-3(b)(1), unless the authorization is terminated or revoked.  Performed at [facility], [address], [place], [place] [address]   Urine Culture     Status: Abnormal (Preliminary result)   Collection Time:  10/03/22 10:13 PM   Specimen: Urine, Clean Catch  Result Value Ref Range Status   Specimen Description   Final    URINE, CLEAN CATCH Performed at [facility], [address], [place], [place] [address]    Special Requests   Final    NONE Performed at [facility], [address] [place], [place] [address]    Culture >=100,000 COLONIES/mL KLEBSIELLA PNEUMONIAE (A)  Final   Report Status PENDING  Incomplete  Culture, blood (Routine X 2) w Reflex to ID Panel     Status: None (Preliminary result)   Collection Time: 10/04/22  1:20 AM   Specimen: BLOOD RIGHT WRIST  Result Value Ref Range Status   Specimen Description BLOOD RIGHT WRIST  Final   Special Requests   Final    BOTTLES DRAWN AEROBIC AND ANAEROBIC Blood Culture results may not be optimal due to an inadequate volume of blood received in culture bottles   Culture   Final    NO GROWTH 1 DAY Performed at [facility], [address] [place], [place] [address]    Report Status PENDING  Incomplete  Culture, blood (Routine X 2) w Reflex to ID Panel     Status: None (Preliminary result)   Collection Time: 10/04/22  1:20 AM   Specimen: BLOOD RIGHT HAND  Result Value Ref Range Status   Specimen Description BLOOD RIGHT HAND  Final   Special Requests   Final    BOTTLES DRAWN AEROBIC AND ANAEROBIC Blood Culture results may not be optimal due to an inadequate volume of blood received in culture bottles   Culture   Final    NO GROWTH 1 DAY Performed at [facility], [address] [place], [place] [address]    Report Status PENDING  Incomplete    Radiology Studies: DG Wrist Complete Left  Result Date: 10/03/2022 CLINICAL DATA:  fall EXAM: LEFT WRIST - COMPLETE 3+ VIEW COMPARISON:  None Available. FINDINGS: Diffusely decreased bone density. There is no evidence of fracture or dislocation. Chondrocalcinosis. Radiocarpal and ulnocarpal degenerative changes. Scaphoid trapezoid and trapezium joint degenerative changes. Subcutaneus soft  tissue edema of the wrist. IMPRESSION: No acute displaced fracture or dislocation. Electronically Signed   By: Iven Finn M.D.   On:  10/03/2022 22:02   CT Head Wo Contrast  Result Date: 10/03/2022 CLINICAL DATA:  Delirium, fall EXAM: CT HEAD WITHOUT CONTRAST TECHNIQUE: Contiguous axial images were obtained from the base of the skull through the vertex without intravenous contrast. RADIATION DOSE REDUCTION: This exam was performed according to the departmental dose-optimization program which includes automated exposure control, adjustment of the mA and/or kV according to patient size and/or use of iterative reconstruction technique. COMPARISON:  09/20/2022 FINDINGS: Brain: No evidence of acute infarction, hemorrhage, hydrocephalus, extra-axial collection or mass lesion/mass effect. Patchy low-density changes within the periventricular and subcortical white matter compatible with chronic microvascular ischemic change. Mild diffuse cerebral volume loss. Vascular: Atherosclerotic calcifications involving the large vessels of the skull base. No unexpected hyperdense vessel. Skull: Normal. Negative for fracture or focal lesion. Sinuses/Orbits: No acute finding. Other: Negative for scalp hematoma. IMPRESSION: 1. No acute intracranial findings. 2. Chronic microvascular ischemic change and cerebral volume loss. Electronically Signed   By: Davina Poke D.O.   On: 10/03/2022 21:59   DG Pelvis 1-2 Views  Result Date: 10/03/2022 CLINICAL DATA:  fall EXAM: PELVIS - 1-2 VIEW COMPARISON:  X-ray pelvis 08/06/2021 FINDINGS: There is no evidence of pelvic fracture or diastasis. Dislocation of the hips. No pelvic bone lesions are seen. IMPRESSION: Negative for acute traumatic injury. Electronically Signed   By: Iven Finn M.D.   On: 10/03/2022 21:59   DG Chest Portable 1 View  Result Date: 10/03/2022 CLINICAL DATA:  fall EXAM: PORTABLE CHEST 1 VIEW COMPARISON:  Chest x-ray 09/20/2022 FINDINGS: The heart and  mediastinal contours are unchanged. Aortic calcification. Low lung volumes. No focal consolidation. Chronic coarsened interstitial markings with no overt pulmonary edema. No pleural effusion. No pneumothorax. No acute osseous abnormality. Bilateral shoulder degenerative changes. IMPRESSION: 1. Low lung volumes with no active disease. 2.  Aortic Atherosclerosis (ICD10-I70.0). Electronically Signed   By: Iven Finn M.D.   On: 10/03/2022 21:58     Scheduled Meds:  apixaban  5 mg Oral BID   atenolol  100 mg Oral BID   feeding supplement (GLUCERNA SHAKE)  237 mL Oral TID BM   insulin aspart  0-9 Units Subcutaneous TID WC   simvastatin  10 mg Oral QHS   Continuous Infusions:  sodium chloride     cefTRIAXone (ROCEPHIN)  IV 1 g (10/05/22 0851)    LOS: 2 days    Roxan Hockey M.D on 10/05/2022 at 9:06 PM  Go to [email or web address] - for contact info  [facility] - Office  [phone]  If 7PM-7AM, please contact night-coverage [email or web address] 10/05/2022, 9:06 PM

## 2022-10-05 NOTE — Consult Note (Signed)
   [facility] CM Inpatient Consult   10/05/2022  AVILENE MARRIN 02/12/1927 [id number]  Secretary Organization [ACO] Patient: Medicare [facility] Liaison remote coverage review for [facility]   Primary Care Provider:  Manon Hilding, MD with [facility]  Patient screened for less than 30 days readmission hospitalization with noted high risk score for unplanned readmission risk  and to assess for potential [facility] Management service needs for post hospital transition for care coordination.  Review of patient's electronic medical record reveals patient is currently being recommended for a skilled nursing level of care.  Previously, this Probation officer had reached out to daughter who declined [place] Management team for community follow up.   Plan:  Continue to follow progress and disposition to assess for post hospital community care coordination/management needs.  Referral request for community care coordination: if patient transitions to a [facility] affiliated facility.  Of note, [facility] Care Management/Population Health does not replace or interfere with any arrangements made by the Inpatient Transition of Care team.  For questions contact:   Natividad Brood, RN BSN [place]  [phone] business mobile phone Toll free office [phone]  *[place]  [phone] Fax number: [phone] [name]

## 2022-10-05 NOTE — TOC Progression Note (Signed)
Transition of Care [facility]) - Progression Note    Patient Details  Name: [name] MRN: [id number] Date of Birth: 04-06-1927  Transition of [facility]) CM/SW Contact  Shade Flood, LCSW Phone Number: 10/05/2022, 3:32 PM  Clinical Narrative:     TOC following. Spoke with daughter to present SNF bed offers. She and pt select [facility]. Woodroe Chen at the SNF. MD anticipating dc Monday at the earliest.   [facility] will follow.  Expected Discharge Plan: [place] Barriers to Discharge: Continued Medical Work up  Expected Discharge Plan and Services Expected Discharge Plan: [place] In-house Referral: Clinical Social Work   Post Acute Care Choice: [place] Living arrangements for the past 2 months: Single Family Home                                       Social Determinants of Health (SDOH) Interventions    Readmission Risk Interventions     No data to display

## 2022-10-05 NOTE — Progress Notes (Signed)
Occupational Therapy Treatment Patient Details Name: [name] MRN: [id number] DOB: Sep 24, 1927 Today's Date: 10/05/2022   History of present illness [name] is a 88 y.o. female with medical history significant of hypertension, atrial fibrillation on Eliquis, T2DM, hyperlipidemia who presents emergency department via EMS due to fall at home.  Patient was somnolent at bedside, she was easily arousable, but was unable to provide a history.  History was obtained from ED physician and daughter at bedside.  Per report, patient has 3-day onset of confusion whereby she sometimes does not recognize the daughter [name] with her and POA), unable to carry on conversation as she usually does at baseline.  She was also reported to have had 3-day onset of discomfort on urination including burning sensation on urination.  Low-grade fever at home and increased heart rate within past 24 hours PTA.  Patient recently completed Keflex due to prior UTI, current symptoms started shortly after completing the antibiotics.    Patient wanted to go to the restroom at home, daughter assisted her while she was ambulating with a walker.  However, due to weakness and fatigue, she lost balance and fell, but daughter was able to support her and prevent her from hitting her head.  Patient was recently admitted from 11/2 to 11/6 due to acute metabolic encephalopathy secondary to UTI and COVID-19 infection   OT comments  Pt agreeable to OT and PT co-treatment. Pt demonstrates improved arousal and affect. Pt able to engage in conversation and follow commands well despite not being oriented to place or year. Pt reports having arthritis issues at baseline in B UE. Pt still limited to ~50% A/ROM shoulder flexion which is reportedly typical for pt. Pt improved stand pivot transfer to mod A today. Pt was assisted with peri-care after have bowel/urination movement while standing. Pt left in chair with call bell within reach and chair alarm  set. Pt will benefit from continued OT in the hospital and recommended venue below to increase strength, balance, and endurance for safe ADL's.      Recommendations for follow up therapy are one component of a multi-disciplinary discharge planning process, led by the attending physician.  Recommendations may be updated based on patient status, additional functional criteria and insurance authorization.    Follow Up Recommendations  Skilled nursing-short term rehab (<3 hours/day)     Assistance Recommended at Discharge Frequent or constant Supervision/Assistance  Patient can return home with the following  A lot of help with walking and/or transfers;A lot of help with bathing/dressing/bathroom;Assistance with cooking/housework;Assistance with feeding;Assist for transportation;Help with stairs or ramp for entrance;Direct supervision/assist for medications management   Equipment Recommendations  None recommended by OT    Recommendations for Other Services      Precautions / Restrictions Precautions Precautions: Fall Restrictions Weight Bearing Restrictions: No       Mobility Bed Mobility Overal bed mobility: Needs Assistance Bed Mobility: Supine to Sit     Supine to sit: Mod assist, Min assist     General bed mobility comments: requires increased time, cueing for sequencing and assist to pull to seated EOB    Transfers Overall transfer level: Needs assistance Equipment used: Rolling walker (2 wheels) Transfers: Sit to/from Stand Sit to Stand: Mod assist     Step pivot transfers: Mod assist     General transfer comment: as per PT     Balance Overall balance assessment: Needs assistance Sitting-balance support: Feet supported, No upper extremity supported Sitting balance-Leahy Scale: Fair Sitting balance -  Comments: seated at EOB   Standing balance support: During functional activity, Reliant on assistive device for balance, Bilateral upper extremity  supported Standing balance-Leahy Scale: Poor Standing balance comment: using RW                           ADL either performed or assessed with clinical judgement   ADL Overall ADL's : Needs assistance/impaired             Lower Body Bathing: Moderate assistance;Maximal assistance;Sitting/lateral leans Lower Body Bathing Details (indicate cue type and reason): Pt able to wash with a wash cloth below the knee of L LE briefly before being assisted by this therapist. Upper Body Dressing : Minimal assistance;Sitting;Moderate assistance Upper Body Dressing Details (indicate cue type and reason): Pt able to don gown with therapist holding gown for pt to insert her arms. Completed seated in the chair. Lower Body Dressing: Maximal assistance;Sitting/lateral leans Lower Body Dressing Details (indicate cue type and reason): Pt attempted to doff a sock but was unable to do so. She reached down to her sock but could not pull it down. Completed seated in the recliner. Toilet Transfer: Moderate assistance;Rolling walker (2 wheels) Toilet Transfer Details (indicate cue type and reason): Simulated via EOB to chair transfer. Toileting- Clothing Manipulation and Hygiene: Total assistance;Sit to/from stand Toileting - Clothing Manipulation Details (indicate cue type and reason): Peri-care completed for pt while standing with PT.              Cognition Arousal/Alertness: Awake/alert Behavior During Therapy: WFL for tasks assessed/performed Overall Cognitive Status: Within Functional Limits for tasks assessed                                 General Comments: Pt still not oriented to place or year but able to follow commands and engage in conversation.        Exercises Exercises: General Upper Extremity General Exercises - Upper Extremity Shoulder Flexion: AROM, 5 reps, Both, Seated                 Pertinent Vitals/ Pain       Pain Assessment Pain Assessment:  No/denies pain                                                          Frequency  Min 2X/week        Progress Toward Goals  OT Goals(current goals can now be found in the care plan section)     Acute Rehab OT Goals Patient Stated Goal: none stated OT Goal Formulation: Patient unable to participate in goal setting Time For Goal Achievement: 10/18/22 Potential to Achieve Goals: Fair  Plan      Co-evaluation    PT/OT/SLP Co-Evaluation/Treatment: Yes Reason for Co-Treatment: To address functional/ADL transfers PT goals addressed during session: Mobility/safety with mobility;Balance;Proper use of DME;Strengthening/ROM OT goals addressed during session: ADL's and self-care                          End of Session Equipment Utilized During Treatment: Rolling walker (2 wheels);Gait belt  OT Visit Diagnosis: Unsteadiness on feet (R26.81);Other abnormalities of gait and mobility (R26.89);Muscle weakness (generalized) (  M62.81);History of falling (Z91.81);Other symptoms and signs involving cognitive function   Activity Tolerance Patient tolerated treatment well   Patient Left in chair;with call bell/phone within reach;with chair alarm set   Nurse Communication Mobility status;Other (comment) (Notified pt had been actively going to the bathroom.)        Time: [id number] OT Time Calculation (min): 35 min  Charges: OT General Charges $OT Visit: 1 Visit OT Treatments $Self Care/Home Management : 8-22 mins  [place] Reason OT, MOT  [name] 10/05/2022, 10:35 AM

## 2022-10-05 NOTE — Care Management Important Message (Deleted)
Important Message  Patient Details  Name: [name] MRN: [id number] Date of Birth: [birth year]   Medicare Important Message Given:  Yes     Tommy Medal 10/05/2022, 11:53 AM

## 2022-10-06 DIAGNOSIS — N390 Urinary tract infection, site not specified: Secondary | ICD-10-CM | POA: Diagnosis not present

## 2022-10-06 DIAGNOSIS — A419 Sepsis, unspecified organism: Secondary | ICD-10-CM | POA: Diagnosis not present

## 2022-10-06 LAB — GLUCOSE, CAPILLARY
Glucose-Capillary: 181 mg/dL — ABNORMAL HIGH (ref 70–99)
Glucose-Capillary: 208 mg/dL — ABNORMAL HIGH (ref 70–99)
Glucose-Capillary: 170 mg/dL — ABNORMAL HIGH (ref 70–99)
Glucose-Capillary: 146 mg/dL — ABNORMAL HIGH (ref 70–99)

## 2022-10-06 LAB — CBC
HCT: 27.8 % — ABNORMAL LOW (ref 36.0–46.0)
Hemoglobin: 9.2 g/dL — ABNORMAL LOW (ref 12.0–15.0)
MCH: 30.6 pg (ref 26.0–34.0)
MCHC: 33.1 g/dL (ref 30.0–36.0)
MCV: 92.4 fL (ref 80.0–100.0)
Platelets: 332 10*3/uL (ref 150–400)
RBC: 3.01 MIL/uL — ABNORMAL LOW (ref 3.87–5.11)
RDW: 15.4 % (ref 11.5–15.5)
WBC: 6.2 10*3/uL (ref 4.0–10.5)
nRBC: 0 % (ref 0.0–0.2)

## 2022-10-06 LAB — BASIC METABOLIC PANEL
Anion gap: 7 (ref 5–15)
BUN: 18 mg/dL (ref 8–23)
CO2: 23 mmol/L (ref 22–32)
Calcium: 8.1 mg/dL — ABNORMAL LOW (ref 8.9–10.3)
Chloride: 101 mmol/L (ref 98–111)
Creatinine, Ser: 0.8 mg/dL (ref 0.44–1.00)
GFR, Estimated: >60 mL/min (ref >60)
Glucose, Bld: 180 mg/dL — ABNORMAL HIGH (ref 70–99)
Potassium: 3.8 mmol/L (ref 3.5–5.1)
Sodium: 131 mmol/L — ABNORMAL LOW (ref 135–145)

## 2022-10-06 NOTE — Progress Notes (Signed)
Patient very pleasant, she slept throughout this shift. No complaints of pain or discomfort at this time.

## 2022-10-06 NOTE — Progress Notes (Signed)
PROGRESS NOTE     [name], is a 88 y.o. female, DOB - 1927-03-12, ION:[id number]  Admit date - 10/03/2022   Admitting Physician Bernadette Hoit, DO  Outpatient Primary MD for the patient is Sasser, Silvestre Moment, MD  LOS - 3  Chief Complaint  Patient presents with   Fall        Brief Narrative:  88 y.o. female with medical history significant of hypertension, atrial fibrillation on Eliquis, T2DM, hyperlipidemia admitted on 10/03/2022 with concerns for sepsis secondary to UTI with altered mentation/acute metabolic encephalopathy -Anticipate discharge to SNF on 10/08/2022   -Assessment and Plan: Sepsis secondary to Klebsiella UTI--POA -Prior History of recurrent Klebsiella UTIs -Continue IV Rocephin pending sensitivity report, should be able to transition to oral therapy once sensitivity report is available   Recent COVID-19 virus infection -Initially diagnosed on 09/20/2022 Patient was already treated with molnupiravir during last admission She was previously vaccinated against COVID and she got a new COVID booster on 09/17/22 Continue supportive care   Fall at home due to generalized weakness This is possibly secondary to patient's COVID and UTI. Continue fall precaution -PT eval appreciated recommends SNF rehab   -Paroxysmal atrial fibrillation -Continue Eliquis and atenolol   DM2-A1c 6.2 reflecting excellent diabetic control PTA -Hold metformin Use Novolog/Humalog Sliding scale insulin with Accu-Cheks/Fingersticks as ordered   Acute metabolic encephalopathy--- secondary to UTI/sepsis as above -Mentation improving slowly  Hypomagnesemia--- replaced   Disposition/Need for in-Hospital Stay- patient unable to be discharged at this time due to sepsis secondary to UTI with altered mentation requiring IV antibiotics and IV fluids, weakness with falls -physical therapist recommends SNF rehab --Anticipate discharge to SNF on 10/08/2022  Status is: Inpatient   Disposition:  The patient is from: Home              Anticipated d/c is to:  SNF Rehab              Anticipated d/c date is: 2 days              Patient currently is not medically stable to d/c. Barriers: Not Clinically Stable-   Code Status :  -  Code Status: DNR   Family Communication:   Discussed with daughter at bedside   DVT Prophylaxis  :   - SCDs   SCDs Start: 10/04/22 0123 apixaban (ELIQUIS) tablet 5 mg   Lab Results  Component Value Date   PLT 332 10/06/2022   Inpatient Medications  Scheduled Meds:  apixaban  5 mg Oral BID   atenolol  100 mg Oral BID   feeding supplement (GLUCERNA SHAKE)  237 mL Oral TID BM   insulin aspart  0-9 Units Subcutaneous TID WC   simvastatin  10 mg Oral QHS   Continuous Infusions:  cefTRIAXone (ROCEPHIN)  IV 1 g (10/06/22 0854)   PRN Meds:.acetaminophen **OR** acetaminophen, ondansetron **OR** ondansetron (ZOFRAN) IV   Anti-infectives (From admission, onward)    Start     Dose/Rate Route Frequency Ordered Stop   10/04/22 1000  cefTRIAXone (ROCEPHIN) 1 g in sodium chloride 0.9 % 100 mL IVPB        1 g 200 mL/hr over 30 Minutes Intravenous Every 24 hours 10/04/22 0247     10/04/22 0300  cefTRIAXone (ROCEPHIN) 1 g in sodium chloride 0.9 % 100 mL IVPB  Status:  Discontinued        1 g 200 mL/hr over 30 Minutes Intravenous Every 24 hours 10/04/22 0246 10/04/22 0247  10/03/22 2345  cefTRIAXone (ROCEPHIN) 1 g in sodium chloride 0.9 % 100 mL IVPB        1 g 200 mL/hr over 30 Minutes Intravenous  Once 10/03/22 2337 10/04/22 0018         Subjective: [name] today has no fevers, no emesis,  No chest pain,   Patient daughter at bedside, questions answered Oral intake is Not great -Mentation improving -Remains very weak with significant ambulatory dysfunction  Objective: Vitals:   10/06/22 0237 10/06/22 0237 10/06/22 0826 10/06/22 1348  BP: (!) 158/81 (!) 158/81 (!) 153/83 (!) 141/75  Pulse: 91 88 92 94  Resp: (!) 25 (!) 25  (!) 22  Temp: (!)  97.5 F (36.4 C) (!) 97.5 F (36.4 C)  (!) 97.4 F (36.3 C)  TempSrc: Oral Oral  Oral  SpO2: 96% 96%  99%  Weight:      Height:        Intake/Output Summary (Last 24 hours) at 10/06/2022 1956 Last data filed at 10/06/2022 1318 Gross per 24 hour  Intake 464.41 ml  Output 4 ml  Net 460.41 ml   Filed Weights   10/03/22 1946 10/04/22 0400  Weight: 61 kg 60.4 kg    Physical Exam  Gen:-less sleepy, chronically ill and frail appearing HEENT:- [place].AT, No sclera icterus Neck-Supple Neck,No JVD,.  Lungs-  CTAB , fair symmetrical air movement CV- S1, S2 normal, irregular  Abd-  +ve B.Sounds, Abd Soft, No tenderness, no CVA tenderness Extremity/Skin:- No  edema, pedal pulses present  Psych-more awake, memory and cognitive deficits noted  neuro-generalized weakness, no new focal deficits, no tremors  Data Reviewed: I have personally reviewed following labs and imaging studies  CBC: Recent Labs  Lab 10/03/22 1934 10/04/22 0120 10/06/22 0456  WBC 15.7* 11.9* 6.2  NEUTROABS 14.0*  --   --   HGB 10.8* 9.7* 9.2*  HCT 32.8* 30.4* 27.8*  MCV 92.4 95.0 92.4  PLT 407* 337 295   Basic Metabolic Panel: Recent Labs  Lab 10/03/22 1934 10/04/22 0120 10/06/22 0456  NA 132* 133* 131*  K 4.2 3.7 3.8  CL 96* 98 101  CO2 25 25 23   GLUCOSE 207* 187* 180*  BUN 21 20 18   CREATININE 0.85 0.82 0.80  CALCIUM 8.8* 8.6* 8.1*  MG  --  1.2*  --   PHOS  --  3.1  --    GFR: Estimated Creatinine Clearance: 35.7 mL/min (by C-G formula based on SCr of 0.8 mg/dL). Liver Function Tests: Recent Labs  Lab 10/03/22 1934 10/04/22 0120  AST 13* 14*  ALT 14 14  ALKPHOS 52 50  BILITOT 0.9 1.0  PROT 6.7 6.7  ALBUMIN 3.2* 3.1*   Recent Results (from the past 240 hour(s))  Resp Panel by RT-PCR (Flu A&B, Covid) Anterior Nasal Swab     Status: Abnormal   Collection Time: 10/03/22  7:34 PM   Specimen: Anterior Nasal Swab  Result Value Ref Range Status   SARS Coronavirus 2 by RT PCR POSITIVE (A)  NEGATIVE Final    Comment: (NOTE) SARS-CoV-2 target nucleic acids are DETECTED.  The SARS-CoV-2 RNA is generally detectable in upper respiratory specimens during the acute phase of infection. Positive results are indicative of the presence of the identified virus, but do not rule out bacterial infection or co-infection with other pathogens not detected by the test. Clinical correlation with patient history and other diagnostic information is necessary to determine patient infection status. The expected result is Negative.  Fact Sheet for Patients: EntrepreneurPulse.com.au  Fact Sheet for Healthcare Providers: IncredibleEmployment.be  This test is not yet approved or cleared by the [place] FDA and  has been authorized for detection and/or diagnosis of SARS-CoV-2 by FDA under an Emergency Use Authorization (EUA).  This EUA will remain in effect (meaning this test can be used) for the duration of  the COVID-19 declaration under Section 564(b)(1) of the A ct, 21 U.S.C. section 360bbb-3(b)(1), unless the authorization is terminated or revoked sooner.     Influenza A by PCR NEGATIVE NEGATIVE Final   Influenza B by PCR NEGATIVE NEGATIVE Final    Comment: (NOTE) The Xpert Xpress SARS-CoV-2/FLU/RSV plus assay is intended as an aid in the diagnosis of influenza from Nasopharyngeal swab specimens and should not be used as a sole basis for treatment. Nasal washings and aspirates are unacceptable for Xpert Xpress SARS-CoV-2/FLU/RSV testing.  Fact Sheet for Patients: EntrepreneurPulse.com.au  Fact Sheet for Healthcare Providers: IncredibleEmployment.be  This test is not yet approved or cleared by the [place] FDA and has been authorized for detection and/or diagnosis of SARS-CoV-2 by FDA under an Emergency Use Authorization (EUA). This EUA will remain in effect (meaning this test can be used) for the  duration of the COVID-19 declaration under Section 564(b)(1) of the Act, 21 U.S.C. section 360bbb-3(b)(1), unless the authorization is terminated or revoked.  Performed at [facility], [address], [place], [place] [address]   Urine Culture     Status: Abnormal (Preliminary result)   Collection Time: 10/03/22 10:13 PM   Specimen: Urine, Clean Catch  Result Value Ref Range Status   Specimen Description   Final    URINE, CLEAN CATCH Performed at [facility], [address], [place], [place] [address]    Special Requests   Final    NONE Performed at [facility], [address], [place], [place] [address]    Culture (A)  Final    >=100,000 COLONIES/mL KLEBSIELLA PNEUMONIAE CONFIRMATION OF SUSCEPTIBILITIES IN PROGRESS CULTURE REINCUBATED FOR BETTER GROWTH Performed at [facility] [address], [place], [place] [address]    Report Status PENDING  Incomplete  Culture, blood (Routine X 2) w Reflex to ID Panel     Status: None (Preliminary result)   Collection Time: 10/04/22  1:20 AM   Specimen: BLOOD RIGHT WRIST  Result Value Ref Range Status   Specimen Description BLOOD RIGHT WRIST  Final   Special Requests   Final    BOTTLES DRAWN AEROBIC AND ANAEROBIC Blood Culture results may not be optimal due to an inadequate volume of blood received in culture bottles   Culture   Final    NO GROWTH 2 DAYS Performed at [facility], [address] [place], [place] [address]    Report Status PENDING  Incomplete  Culture, blood (Routine X 2) w Reflex to ID Panel     Status: None (Preliminary result)   Collection Time: 10/04/22  1:20 AM   Specimen: BLOOD RIGHT HAND  Result Value Ref Range Status   Specimen Description BLOOD RIGHT HAND  Final   Special Requests   Final    BOTTLES DRAWN AEROBIC AND ANAEROBIC Blood Culture results may not be optimal due to an inadequate volume of blood received in culture bottles   Culture   Final    NO GROWTH 2 DAYS Performed at [facility], [address], [place], [place] [address]    Report Status PENDING  Incomplete    Radiology  Studies: No results found.   Scheduled Meds:  apixaban  5 mg Oral BID   atenolol  100 mg Oral BID   feeding supplement (GLUCERNA SHAKE)  237 mL Oral TID BM   insulin aspart  0-9 Units Subcutaneous TID WC   simvastatin  10 mg Oral QHS   Continuous Infusions:  cefTRIAXone (ROCEPHIN)  IV 1 g (10/06/22 0854)    LOS: 3 days    Roxan Hockey M.D on 10/06/2022 at 7:56 PM  Go to [email or web address] - for contact info  [facility] - Office  [phone]  If 7PM-7AM, please contact night-coverage [email or web address] 10/06/2022, 7:56 PM

## 2022-10-06 NOTE — TOC Progression Note (Signed)
Transition of Care [facility]) - Progression Note    Patient Details  Name: [name] MRN: [id number] Date of Birth: 06-25-1927  Transition of [facility] CM/SW Contact  Joanne Chars, LCSW Phone Number: 10/06/2022, 1:19 PM  Clinical Narrative:   Confirmed pt has 3 midnight stay, confirmed [facility] can take today.   1315: Per MD, pt will DC Monday.  [facility] informed.    Expected Discharge Plan: [place] Barriers to Discharge: Continued Medical Work up  Expected Discharge Plan and Services Expected Discharge Plan: [place] In-house Referral: Clinical Social Work   Post Acute Care Choice: [place] Living arrangements for the past 2 months: Single Family Home                                       Social Determinants of Health (SDOH) Interventions    Readmission Risk Interventions     No data to display

## 2022-10-07 DIAGNOSIS — E871 Hypo-osmolality and hyponatremia: Secondary | ICD-10-CM | POA: Diagnosis not present

## 2022-10-07 DIAGNOSIS — R410 Disorientation, unspecified: Secondary | ICD-10-CM | POA: Diagnosis not present

## 2022-10-07 DIAGNOSIS — N390 Urinary tract infection, site not specified: Secondary | ICD-10-CM | POA: Diagnosis not present

## 2022-10-07 DIAGNOSIS — A419 Sepsis, unspecified organism: Secondary | ICD-10-CM | POA: Diagnosis not present

## 2022-10-07 DIAGNOSIS — Z9189 Other specified personal risk factors, not elsewhere classified: Secondary | ICD-10-CM | POA: Diagnosis not present

## 2022-10-07 DIAGNOSIS — Z9181 History of falling: Secondary | ICD-10-CM | POA: Diagnosis not present

## 2022-10-07 LAB — CARBAPENEM RESISTANCE PANEL
Carba Resistance IMP Gene: NOT DETECTED
Carba Resistance KPC Gene: DETECTED — AB
Carba Resistance NDM Gene: NOT DETECTED
Carba Resistance OXA48 Gene: NOT DETECTED
Carba Resistance VIM Gene: NOT DETECTED

## 2022-10-07 LAB — URINE CULTURE: Culture: >=100000 — AB

## 2022-10-07 LAB — GLUCOSE, CAPILLARY
Glucose-Capillary: 182 mg/dL — ABNORMAL HIGH (ref 70–99)
Glucose-Capillary: 260 mg/dL — ABNORMAL HIGH (ref 70–99)
Glucose-Capillary: 202 mg/dL — ABNORMAL HIGH (ref 70–99)
Glucose-Capillary: 156 mg/dL — ABNORMAL HIGH (ref 70–99)

## 2022-10-07 MED ORDER — CIPROFLOXACIN HCL 250 MG PO TABS
500.0000 mg | ORAL_TABLET | Freq: Two times a day (BID) | ORAL | Status: DC
  Administered 2022-10-07 – 2022-10-08 (×2): 500 mg via ORAL
  Filled 2022-10-07 (×2): qty 2

## 2022-10-07 NOTE — Progress Notes (Signed)
Pt slept through the night, hypertension and tachycardia noted. 0/10 pain reported. +1 edema present in bilateral lower extremities.

## 2022-10-07 NOTE — Progress Notes (Addendum)
PROGRESS NOTE     [name], is a 88 y.o. female, DOB - 05-26-1927, SVX:[id number]  Admit date - 10/03/2022   Admitting Physician Bernadette Hoit, DO  Outpatient Primary MD for the patient is Sasser, Silvestre Moment, MD  LOS - 4  Chief Complaint  Patient presents with   Fall        Brief Narrative:  88 y.o. female with medical history significant of hypertension, atrial fibrillation on Eliquis, T2DM, hyperlipidemia admitted on 10/03/2022 with concerns for sepsis secondary to UTI with altered mentation/acute metabolic encephalopathy -Anticipate discharge to SNF on 10/08/2022   -Assessment and Plan: Sepsis secondary to Klebsiella UTI--POA -Prior History of recurrent Klebsiella UTIs WBC 15.7 >> 11.9 >> 6.2 -Continue IV Rocephin pending sensitivity report, should be able to transition to oral therapy once sensitivity report is available   Recent COVID-19 virus infection -Initially diagnosed on 09/20/2022 Patient was already treated with molnupiravir during last admission She was previously vaccinated against COVID and she got a new COVID booster on 09/17/22 Continue supportive care   Fall at home due to generalized weakness This is possibly secondary to patient's COVID and UTI. Continue fall precaution -Generalized weakness and ambulatory dysfunction/gait problems persist -PT eval appreciated recommends SNF rehab --Anticipate discharge to SNF on 10/08/2022   -Paroxysmal atrial fibrillation -Continue Eliquis and atenolol   DM2-A1c 6.2 reflecting excellent diabetic control PTA -Hold metformin Use Novolog/Humalog Sliding scale insulin with Accu-Cheks/Fingersticks as ordered   Acute metabolic encephalopathy--- secondary to UTI/sepsis as above -Mentation is pretty much back to baseline according to patient's daughter after treatment of UTI as above #1  Hypomagnesemia--- replaced   Disposition/Need for in-Hospital Stay- patient unable to be discharged at this time due to sepsis  secondary to UTI with altered mentation requiring IV antibiotics and IV fluids, weakness with falls -physical therapist recommends SNF rehab --Anticipate discharge to SNF on 10/08/2022  Status is: Inpatient   Disposition: The patient is from: Home              Anticipated d/c is to:  SNF Rehab              Anticipated d/c date is: 1 day              Patient currently is not medically stable to d/c. Barriers: Not Clinically Stable-   Code Status :  -  Code Status: DNR   Family Communication:   Discussed with daughter at bedside   DVT Prophylaxis  :   - SCDs   SCDs Start: 10/04/22 0123 apixaban (ELIQUIS) tablet 5 mg   Lab Results  Component Value Date   PLT 332 10/06/2022   Inpatient Medications  Scheduled Meds:  apixaban  5 mg Oral BID   atenolol  100 mg Oral BID   feeding supplement (GLUCERNA SHAKE)  237 mL Oral TID BM   insulin aspart  0-9 Units Subcutaneous TID WC   simvastatin  10 mg Oral QHS   Continuous Infusions:  cefTRIAXone (ROCEPHIN)  IV 1 g (10/07/22 0829)   PRN Meds:.acetaminophen **OR** acetaminophen, ondansetron **OR** ondansetron (ZOFRAN) IV   Anti-infectives (From admission, onward)    Start     Dose/Rate Route Frequency Ordered Stop   10/04/22 1000  cefTRIAXone (ROCEPHIN) 1 g in sodium chloride 0.9 % 100 mL IVPB        1 g 200 mL/hr over 30 Minutes Intravenous Every 24 hours 10/04/22 0247     10/04/22 0300  cefTRIAXone (ROCEPHIN) 1 g in sodium  chloride 0.9 % 100 mL IVPB  Status:  Discontinued        1 g 200 mL/hr over 30 Minutes Intravenous Every 24 hours 10/04/22 0246 10/04/22 0247   10/03/22 2345  cefTRIAXone (ROCEPHIN) 1 g in sodium chloride 0.9 % 100 mL IVPB        1 g 200 mL/hr over 30 Minutes Intravenous  Once 10/03/22 2337 10/04/22 0018         Subjective: [name] today has no fevers, no emesis,  No chest pain,   - Appetite improving slowly --Generalized weakness and ambulatory dysfunction/gait problems persist --Anticipate  discharge to SNF on 10/08/2022  Objective: Vitals:   10/06/22 0826 10/06/22 1348 10/06/22 2206 10/07/22 0305  BP: (!) 153/83 (!) 141/75 (!) 145/84 (!) 151/96  Pulse: 92 94 91 (!) 108  Resp:  (!) 22 18 19   Temp:  (!) 97.4 F (36.3 C) 98.2 F (36.8 C) 98 F (36.7 C)  TempSrc:  Oral    SpO2:  99% 97% 95%  Weight:      Height:        Intake/Output Summary (Last 24 hours) at 10/07/2022 1251 Last data filed at 10/07/2022 0500 Gross per 24 hour  Intake 240 ml  Output 1052 ml  Net -812 ml   Filed Weights   10/03/22 1946 10/04/22 0400  Weight: 61 kg 60.4 kg    Physical Exam  Gen:-Awake, alert, chronically ill and frail appearing HEENT:- [place].AT, No sclera icterus Neck-Supple Neck,No JVD,.  Lungs-  CTAB , fair symmetrical air movement CV- S1, S2 normal, irregular  Abd-  +ve B.Sounds, Abd Soft, No tenderness, no CVA tenderness Extremity/Skin:- No  edema, pedal pulses present  Psych-affect is appropriate, oriented x3,, baseline/underlying memory and cognitive deficits noted  neuro-generalized weakness, no new focal deficits, no tremors  Data Reviewed: I have personally reviewed following labs and imaging studies  CBC: Recent Labs  Lab 10/03/22 1934 10/04/22 0120 10/06/22 0456  WBC 15.7* 11.9* 6.2  NEUTROABS 14.0*  --   --   HGB 10.8* 9.7* 9.2*  HCT 32.8* 30.4* 27.8*  MCV 92.4 95.0 92.4  PLT 407* 337 096   Basic Metabolic Panel: Recent Labs  Lab 10/03/22 1934 10/04/22 0120 10/06/22 0456  NA 132* 133* 131*  K 4.2 3.7 3.8  CL 96* 98 101  CO2 25 25 23   GLUCOSE 207* 187* 180*  BUN 21 20 18   CREATININE 0.85 0.82 0.80  CALCIUM 8.8* 8.6* 8.1*  MG  --  1.2*  --   PHOS  --  3.1  --    GFR: Estimated Creatinine Clearance: 35.7 mL/min (by C-G formula based on SCr of 0.8 mg/dL). Liver Function Tests: Recent Labs  Lab 10/03/22 1934 10/04/22 0120  AST 13* 14*  ALT 14 14  ALKPHOS 52 50  BILITOT 0.9 1.0  PROT 6.7 6.7  ALBUMIN 3.2* 3.1*   Recent Results (from  the past 240 hour(s))  Resp Panel by RT-PCR (Flu A&B, Covid) Anterior Nasal Swab     Status: Abnormal   Collection Time: 10/03/22  7:34 PM   Specimen: Anterior Nasal Swab  Result Value Ref Range Status   SARS Coronavirus 2 by RT PCR POSITIVE (A) NEGATIVE Final    Comment: (NOTE) SARS-CoV-2 target nucleic acids are DETECTED.  The SARS-CoV-2 RNA is generally detectable in upper respiratory specimens during the acute phase of infection. Positive results are indicative of the presence of the identified virus, but do not rule out bacterial infection  or co-infection with other pathogens not detected by the test. Clinical correlation with patient history and other diagnostic information is necessary to determine patient infection status. The expected result is Negative.  Fact Sheet for Patients: EntrepreneurPulse.com.au  Fact Sheet for Healthcare Providers: IncredibleEmployment.be  This test is not yet approved or cleared by the [place] FDA and  has been authorized for detection and/or diagnosis of SARS-CoV-2 by FDA under an Emergency Use Authorization (EUA).  This EUA will remain in effect (meaning this test can be used) for the duration of  the COVID-19 declaration under Section 564(b)(1) of the A ct, 21 U.S.C. section 360bbb-3(b)(1), unless the authorization is terminated or revoked sooner.     Influenza A by PCR NEGATIVE NEGATIVE Final   Influenza B by PCR NEGATIVE NEGATIVE Final    Comment: (NOTE) The Xpert Xpress SARS-CoV-2/FLU/RSV plus assay is intended as an aid in the diagnosis of influenza from Nasopharyngeal swab specimens and should not be used as a sole basis for treatment. Nasal washings and aspirates are unacceptable for Xpert Xpress SARS-CoV-2/FLU/RSV testing.  Fact Sheet for Patients: EntrepreneurPulse.com.au  Fact Sheet for Healthcare Providers: IncredibleEmployment.be  This test is  not yet approved or cleared by the [place] FDA and has been authorized for detection and/or diagnosis of SARS-CoV-2 by FDA under an Emergency Use Authorization (EUA). This EUA will remain in effect (meaning this test can be used) for the duration of the COVID-19 declaration under Section 564(b)(1) of the Act, 21 U.S.C. section 360bbb-3(b)(1), unless the authorization is terminated or revoked.  Performed at [facility], [address] [place], [place] [address]   Urine Culture     Status: Abnormal (Preliminary result)   Collection Time: 10/03/22 10:13 PM   Specimen: Urine, Clean Catch  Result Value Ref Range Status   Specimen Description   Final    URINE, CLEAN CATCH Performed at [facility], [address] [place], [place] [address]    Special Requests   Final    NONE Performed at [facility], [address], [place], [place] [address]    Culture (A)  Final    >=100,000 COLONIES/mL KLEBSIELLA PNEUMONIAE CONFIRMATION OF SUSCEPTIBILITIES IN PROGRESS CULTURE REINCUBATED FOR BETTER GROWTH Performed at [facility] [address] [place], [place] [address]    Report Status PENDING  Incomplete  Culture, blood (Routine X 2) w Reflex to ID Panel     Status: None (Preliminary result)   Collection Time: 10/04/22  1:20 AM   Specimen: BLOOD RIGHT WRIST  Result Value Ref Range Status   Specimen Description BLOOD RIGHT WRIST  Final   Special Requests   Final    BOTTLES DRAWN AEROBIC AND ANAEROBIC Blood Culture results may not be optimal due to an inadequate volume of blood received in culture bottles   Culture   Final    NO GROWTH 3 DAYS Performed at [facility], [address], [place], [place] [address]    Report Status PENDING  Incomplete  Culture, blood (Routine X 2) w Reflex to ID Panel     Status: None (Preliminary result)   Collection Time: 10/04/22  1:20 AM   Specimen: BLOOD RIGHT HAND  Result Value Ref Range Status   Specimen Description BLOOD RIGHT HAND  Final    Special Requests   Final    BOTTLES DRAWN AEROBIC AND ANAEROBIC Blood Culture results may not be optimal due to an inadequate volume of blood received in culture bottles   Culture  Final    NO GROWTH 3 DAYS Performed at [facility], [address], [place],  [address]    Report Status PENDING  Incomplete    Radiology Studies: No results found.   Scheduled Meds:  apixaban  5 mg Oral BID   atenolol  100 mg Oral BID   feeding supplement (GLUCERNA SHAKE)  237 mL Oral TID BM   insulin aspart  0-9 Units Subcutaneous TID WC   simvastatin  10 mg Oral QHS   Continuous Infusions:  cefTRIAXone (ROCEPHIN)  IV 1 g (10/07/22 0829)    LOS: 4 days    Roxan Hockey M.D on 10/07/2022 at 12:51 PM  Go to [email or web address] - for contact info  [facility] - Office  [phone]  If 7PM-7AM, please contact night-coverage [email or web address] 10/07/2022, 12:51 PM

## 2022-10-08 DIAGNOSIS — N390 Urinary tract infection, site not specified: Secondary | ICD-10-CM | POA: Diagnosis not present

## 2022-10-08 DIAGNOSIS — N3000 Acute cystitis without hematuria: Secondary | ICD-10-CM | POA: Diagnosis not present

## 2022-10-08 DIAGNOSIS — E871 Hypo-osmolality and hyponatremia: Secondary | ICD-10-CM | POA: Diagnosis not present

## 2022-10-08 DIAGNOSIS — M5134 Other intervertebral disc degeneration, thoracic region: Secondary | ICD-10-CM | POA: Diagnosis not present

## 2022-10-08 DIAGNOSIS — R5381 Other malaise: Secondary | ICD-10-CM | POA: Diagnosis not present

## 2022-10-08 DIAGNOSIS — R2689 Other abnormalities of gait and mobility: Secondary | ICD-10-CM | POA: Diagnosis not present

## 2022-10-08 DIAGNOSIS — E860 Dehydration: Secondary | ICD-10-CM | POA: Diagnosis not present

## 2022-10-08 DIAGNOSIS — J9601 Acute respiratory failure with hypoxia: Secondary | ICD-10-CM | POA: Diagnosis not present

## 2022-10-08 DIAGNOSIS — E1122 Type 2 diabetes mellitus with diabetic chronic kidney disease: Secondary | ICD-10-CM | POA: Diagnosis not present

## 2022-10-08 DIAGNOSIS — F03C0 Unspecified dementia, severe, without behavioral disturbance, psychotic disturbance, mood disturbance, and anxiety: Secondary | ICD-10-CM | POA: Diagnosis not present

## 2022-10-08 DIAGNOSIS — I10 Essential (primary) hypertension: Secondary | ICD-10-CM | POA: Diagnosis not present

## 2022-10-08 DIAGNOSIS — E785 Hyperlipidemia, unspecified: Secondary | ICD-10-CM | POA: Diagnosis not present

## 2022-10-08 DIAGNOSIS — A414 Sepsis due to anaerobes: Secondary | ICD-10-CM | POA: Diagnosis not present

## 2022-10-08 DIAGNOSIS — Z7901 Long term (current) use of anticoagulants: Secondary | ICD-10-CM | POA: Diagnosis not present

## 2022-10-08 DIAGNOSIS — G9341 Metabolic encephalopathy: Secondary | ICD-10-CM | POA: Diagnosis not present

## 2022-10-08 DIAGNOSIS — U071 COVID-19: Secondary | ICD-10-CM | POA: Diagnosis not present

## 2022-10-08 DIAGNOSIS — R41841 Cognitive communication deficit: Secondary | ICD-10-CM | POA: Diagnosis not present

## 2022-10-08 DIAGNOSIS — J9811 Atelectasis: Secondary | ICD-10-CM | POA: Diagnosis not present

## 2022-10-08 DIAGNOSIS — I4891 Unspecified atrial fibrillation: Secondary | ICD-10-CM | POA: Diagnosis not present

## 2022-10-08 DIAGNOSIS — A419 Sepsis, unspecified organism: Secondary | ICD-10-CM | POA: Diagnosis not present

## 2022-10-08 DIAGNOSIS — E119 Type 2 diabetes mellitus without complications: Secondary | ICD-10-CM | POA: Diagnosis not present

## 2022-10-08 DIAGNOSIS — M6281 Muscle weakness (generalized): Secondary | ICD-10-CM | POA: Diagnosis not present

## 2022-10-08 DIAGNOSIS — E441 Mild protein-calorie malnutrition: Secondary | ICD-10-CM | POA: Diagnosis not present

## 2022-10-08 LAB — CBC
HCT: 27.8 % — ABNORMAL LOW (ref 36.0–46.0)
Hemoglobin: 9.2 g/dL — ABNORMAL LOW (ref 12.0–15.0)
MCH: 30.1 pg (ref 26.0–34.0)
MCHC: 33.1 g/dL (ref 30.0–36.0)
MCV: 90.8 fL (ref 80.0–100.0)
Platelets: 367 10*3/uL (ref 150–400)
RBC: 3.06 MIL/uL — ABNORMAL LOW (ref 3.87–5.11)
RDW: 15.3 % (ref 11.5–15.5)
WBC: 8.6 10*3/uL (ref 4.0–10.5)
nRBC: 0 % (ref 0.0–0.2)

## 2022-10-08 LAB — BASIC METABOLIC PANEL
Anion gap: 7 (ref 5–15)
BUN: 19 mg/dL (ref 8–23)
CO2: 23 mmol/L (ref 22–32)
Calcium: 8.2 mg/dL — ABNORMAL LOW (ref 8.9–10.3)
Chloride: 101 mmol/L (ref 98–111)
Creatinine, Ser: 0.83 mg/dL (ref 0.44–1.00)
GFR, Estimated: >60 mL/min (ref >60)
Glucose, Bld: 197 mg/dL — ABNORMAL HIGH (ref 70–99)
Potassium: 4.1 mmol/L (ref 3.5–5.1)
Sodium: 131 mmol/L — ABNORMAL LOW (ref 135–145)

## 2022-10-08 LAB — GLUCOSE, CAPILLARY
Glucose-Capillary: 187 mg/dL — ABNORMAL HIGH (ref 70–99)
Glucose-Capillary: 204 mg/dL — ABNORMAL HIGH (ref 70–99)

## 2022-10-08 MED ORDER — CIPROFLOXACIN HCL 500 MG PO TABS: 500.0000 mg | ORAL_TABLET | Freq: Two times a day (BID) | ORAL | 1 refills | Status: AC

## 2022-10-08 MED ORDER — ACETAMINOPHEN 325 MG PO TABS: 650.0000 mg | ORAL_TABLET | Freq: Four times a day (QID) | ORAL | 0 refills | Status: DC | PRN

## 2022-10-08 NOTE — Progress Notes (Signed)
Nsg Discharge Note  Admit Date:  10/03/2022 Discharge date: 10/08/2022   [name] to be D/C'd Rehab  per MD order.  AVS completed.  Copy for chart, and copy for patient signed, and dated. Patient/caregiver able to verbalize understanding.  Discharge Medication: Allergies as of 10/08/2022       Reactions   Toprol Xl [metoprolol Succinate] Other (See Comments)   HAIR LOSS        Medication List     STOP taking these medications    cephALEXin 500 MG capsule Commonly known as: KEFLEX       TAKE these medications    acetaminophen 325 MG tablet Commonly known as: TYLENOL Take 2 tablets (650 mg total) by mouth every 6 (six) hours as needed for mild pain, fever or headache (or Fever >/= 101). What changed:  medication strength how much to take when to take this reasons to take this   apixaban 5 MG Tabs tablet Commonly known as: ELIQUIS Take 5 mg by mouth 2 (two) times daily.   atenolol 100 MG tablet Commonly known as: TENORMIN Take 1 tablet (100 mg total) by mouth 2 (two) times daily.   ciprofloxacin 500 MG tablet Commonly known as: CIPRO Take 1 tablet (500 mg total) by mouth 2 (two) times daily for 5 days.   metFORMIN 500 MG 24 hr tablet Commonly known as: GLUCOPHAGE-XR Take 2 tabs (1,000mg ) by mouth every morning & 1 tab (500mg ) every evening   multivitamin Liqd Take 5 mLs by mouth daily.   simvastatin 10 MG tablet Commonly known as: ZOCOR Take 10 mg by mouth at bedtime.        Discharge Assessment: Vitals:   10/07/22 2051 10/08/22 0436  BP: (!) 167/88 (!) 157/91  Pulse: 91 (!) 108  Resp: 18 19  Temp: 98 F (36.7 C) 98.1 F (36.7 C)  SpO2: 97% 96%   Skin clean, dry and intact without evidence of skin break down, no evidence of skin tears noted. IV catheter discontinued intact. Site without signs and symptoms of complications - no redness or edema noted at insertion site, patient denies c/o pain - only slight tenderness at site.  Dressing  with slight pressure applied.  D/c Instructions-Education: Discharge instructions given to patient/family with verbalized understanding. D/c education completed with patient/family including follow up instructions, medication list, d/c activities limitations if indicated, with other d/c instructions as indicated by MD - patient able to verbalize understanding, all questions fully answered. Patient instructed to return to ED, call 911, or call MD for any changes in condition.  Patient escorted via [place], and D/C home via private auto.  Clovis Fredrickson, LPN [id number] 73:42 PM

## 2022-10-08 NOTE — TOC Transition Note (Signed)
Transition of Care [facility]) - CM/SW Discharge Note   Patient Details  Name: [name] MRN: [id number] Date of Birth: 1927/02/12  Transition of [facility] CM/SW Contact:  [name], [place] Phone Number: 10/08/2022, 11:51 AM   Clinical Narrative:  Pt d/c today to [facility]. [name] and facility aware and agreeable. Daughter requesting to transport herself. D/C summary sent to SNF. RN given number to call report.       Final next level of care: Skilled Nursing Facility Barriers to Discharge: Barriers Resolved   Patient Goals and CMS Choice Patient states their goals for this hospitalization and ongoing recovery are:: rehab CMS Medicare.gov Compare Post Acute Care list provided to:: Patient Represenative (must comment) Choice offered to / list presented to : Adult Children  Discharge Placement              Patient chooses bed at: [facility] Patient to be transferred to facility by: family Name of family member notified: [name]- daughter Patient and family notified of of transfer: 10/08/22  Discharge Plan and Services In-house Referral: Clinical Social Work   Post Acute Care Choice: [place]                               Social Determinants of Health (SDOH) Interventions     Readmission Risk Interventions     No data to display

## 2022-10-08 NOTE — Discharge Instructions (Signed)
Avoid ibuprofen/Advil/Aleve/Motrin/Goody Powders/Naproxen/BC powders/Meloxicam/Diclofenac/Indomethacin and other Nonsteroidal anti-inflammatory medications as these will make you more likely to bleed and can cause stomach ulcers, can also cause Kidney problems.

## 2022-10-08 NOTE — Care Management Important Message (Signed)
Important Message  Patient Details  Name: [name] MRN: [id number] Date of Birth: 1927/06/22   Medicare Important Message Given:  Other (see comment) (upable to reach by phone at [phone], [phone] ( daughter Orlando Penner) copy mailed to address on file)     Tommy Medal 10/08/2022, 11:18 AM

## 2022-10-08 NOTE — Discharge Summary (Signed)
[name], is a 88 y.o. female  DOB Nov 26, 1926  MRN [id number].  Admission date:  10/03/2022  Admitting Physician  Bernadette Hoit, DO  Discharge Date:  10/08/2022   Primary MD  Manon Hilding, MD  Recommendations for primary care physician for things to follow:   Avoid ibuprofen/Advil/Aleve/Motrin/Goody Powders/Naproxen/BC powders/Meloxicam/Diclofenac/Indomethacin and other Nonsteroidal anti-inflammatory medications as these will make you more likely to bleed and can cause stomach ulcers, can also cause Kidney problems.   Admission Diagnosis  Encephalopathy [G93.40] Sepsis secondary to UTI ([place]) [A41.9, N39.0] Urinary tract infection with hematuria, site unspecified [N39.0, R31.9] Sepsis, due to unspecified organism, unspecified whether acute organ dysfunction present ([place]) [A41.9] COVID-19 [U07.1]  Discharge Diagnosis  Encephalopathy [G93.40] Sepsis secondary to UTI ([place]) [A41.9, N39.0] Urinary tract infection with hematuria, site unspecified [N39.0, R31.9] Sepsis, due to unspecified organism, unspecified whether acute organ dysfunction present ([place]) [A41.9] COVID-19 [U07.1]    Principal Problem:   Sepsis secondary to UTI [facility]) Active Problems:   COVID-19 virus infection   Chronic atrial fibrillation with RVR ([place])   Generalized weakness   Fall at home, initial encounter   Type 2 diabetes mellitus with hyperglycemia ([place])   Elevated lipase   Hypoalbuminemia due to protein-calorie malnutrition ([place])   Mixed hyperlipidemia      Past Medical History:  Diagnosis Date   Atrial fibrillation ([place])    Coronary atherosclerosis of native coronary artery    Nonobstructive   Dyslipidemia    PSVT (paroxysmal supraventricular tachycardia)    TIA (transient ischemic attack)    Type 2 diabetes mellitus ([place])    Diet controlled    Past Surgical History:  Procedure Laterality Date   ABDOMINAL  HYSTERECTOMY     CARPAL TUNNEL RELEASE     x's 2   CHOLECYSTECTOMY     REPLACEMENT TOTAL KNEE  2002 & 2012   SHOULDER SURGERY  2001     HPI  from the history and physical done on the day of admission:     HPI: AZRIEL [name] is a 88 y.o. female with medical history significant of hypertension, atrial fibrillation on Eliquis, T2DM, hyperlipidemia who presents emergency department via EMS due to fall at home.  Patient was somnolent at bedside, she was easily arousable, but was unable to provide a history.  History was obtained from ED physician and daughter at bedside.  Per report, patient has 3-day onset of confusion whereby she sometimes does not recognize the daughter [name] with her and POA), unable to carry on conversation as she usually does at baseline.  She was also reported to have had 3-day onset of discomfort on urination including burning sensation on urination.  Low-grade fever at home and increased heart rate within past 24 hours PTA.  Patient recently completed Keflex due to prior UTI, current symptoms started shortly after completing the antibiotics.   Patient wanted to go to the restroom at home, daughter assisted her while she was ambulating with a walker.  However, due to weakness and fatigue, she  lost balance and fell, but daughter was able to support her and prevent her from hitting her head. Patient was recently admitted from 11/2 to 11/6 due to acute metabolic encephalopathy secondary to UTI and COVID-19 infection   ED Course:  In the emergency department, she was tachypneic, BP was 151/99, pulse 75, O2 sat was 96% on room air and temperature was 56F.  Work-up in the ED showed leukocytosis, normocytic anemia, ammonia 12, troponin x2 was flat at 7, BMP was normal except for sodium of 132, chloride 96 and blood glucose of 207, albumin 3.2, lipase 64.  Lactic acid 1.6.  Urinalysis showed large leukocytes with greater than 50 WBCs.  Influenza A, B-.  SARS coronavirus 2 was positive  (this was positive on prior test done on 11/2 while she was admitted here). Left wrist x-ray showed no acute displaced fracture or dislocation CT head without contrast showed no acute intracranial findings Pelvic x-ray was negative for acute traumatic injury Chest x-ray showed low lung volumes with no active disease She was treated with IV ceftriaxone, IV Lopressor 5 mg x 2 was given due to A-fib with RVR.  IV hydration was provided.  Hospitalist was asked to admit patient for further evaluation and management.   Review of Systems: Review of systems as noted in the HPI. All other systems reviewed and are negative   Hospital Course:   Brief Narrative:  88 y.o. female with medical history significant of hypertension, atrial fibrillation on Eliquis, T2DM, hyperlipidemia admitted on 10/03/2022 with concerns for sepsis secondary to Klebsiella UTI with altered mentation/acute metabolic encephalopathy - discharge to SNF on 10/08/2022   -Assessment and Plan: 1)Sepsis Secondary to Klebsiella UTI--POA -KLEBSIELLA PNEUMONIAE CONFIRMED CARBAPENEMASE RESISTANT ENTEROBACTERIACAE  -Prior History of Recurrent Klebsiella UTIs WBC 15.7 >> 11.9 >> 8.6 -Initially treated with IV Rocephin, transitioned to Cipro on 10/07/2022 based on sensitivity report -clinically much improved -Patient sadly has recurrent UTIs usually with Klebsiella,  this time around the Klebsiella organism is becoming more resistant unlike prior isolates   2)Recent COVID-19 virus infection -Initially diagnosed on 09/20/2022 Patient was already treated with molnupiravir during last admission She was previously vaccinated against COVID and she got a new COVID booster on 09/17/22 Continue supportive care   3)Fall at home due to generalized weakness This is possibly secondary to patient's COVID and UTI. Continue fall precaution -Generalized weakness and ambulatory dysfunction/gait problems persist -PT eval appreciated recommends SNF  rehab -Discharge to SNF on 10/08/2022   4)Paroxysmal atrial fibrillation -Continue Eliquis and atenolol   5)DM2-A1c 6.2 reflecting excellent diabetic control PTA -Resume  metformin   6)Acute metabolic encephalopathy--- secondary to UTI/sepsis as above -Now resolved -Mentation is pretty much back to baseline according to patient's daughter after treatment of UTI as above #1   7)Hypomagnesemia--- replaced  8) mild hyponatremia--avoid dehydration  Disposition: The patient is from: Home              Anticipated d/c is to:  SNF Rehab  Discharge Condition: stable  Follow UP   Contact information for after-discharge care     [facility] .   Service: Skilled Nursing Contact information: [address] [place] [phone]                     Diet and Activity recommendation:  As advised  Discharge Instructions    Discharge Instructions     Call MD for:  difficulty breathing, headache or visual disturbances   Complete by: As directed    Call MD for:  persistant dizziness or light-headedness   Complete by: As directed    Call MD for:  persistant nausea and vomiting   Complete by: As directed    Call MD for:  temperature >100.4   Complete by: As directed    Diet - low sodium heart healthy   Complete by: As directed    Diet Carb Modified   Complete by: As directed    Discharge instructions   Complete by: As directed    Avoid ibuprofen/Advil/Aleve/Motrin/Goody Powders/Naproxen/BC powders/Meloxicam/Diclofenac/Indomethacin and other Nonsteroidal anti-inflammatory medications as these will make you more likely to bleed and can cause stomach ulcers, can also cause Kidney problems.   Increase activity slowly   Complete by: As directed        Discharge Medications    Allergies as of 10/08/2022       Reactions   Toprol Xl [metoprolol Succinate] Other (See Comments)   HAIR  LOSS        Medication List     STOP taking these medications    cephALEXin 500 MG capsule Commonly known as: KEFLEX       TAKE these medications    acetaminophen 325 MG tablet Commonly known as: TYLENOL Take 2 tablets (650 mg total) by mouth every 6 (six) hours as needed for mild pain, fever or headache (or Fever >/= 101). What changed:  medication strength how much to take when to take this reasons to take this   apixaban 5 MG Tabs tablet Commonly known as: ELIQUIS Take 5 mg by mouth 2 (two) times daily.   atenolol 100 MG tablet Commonly known as: TENORMIN Take 1 tablet (100 mg total) by mouth 2 (two) times daily.   ciprofloxacin 500 MG tablet Commonly known as: CIPRO Take 1 tablet (500 mg total) by mouth 2 (two) times daily for 5 days.   metFORMIN 500 MG 24 hr tablet Commonly known as: GLUCOPHAGE-XR Take 2 tabs (1,000mg ) by mouth every morning & 1 tab (500mg ) every evening   multivitamin Liqd Take 5 mLs by mouth daily.   simvastatin 10 MG tablet Commonly known as: ZOCOR Take 10 mg by mouth at bedtime.       Major procedures and Radiology Reports - PLEASE review detailed and final reports for all details, in brief -   DG Wrist Complete Left  Result Date: 10/03/2022 CLINICAL DATA:  fall EXAM: LEFT WRIST - COMPLETE 3+ VIEW COMPARISON:  None Available. FINDINGS: Diffusely decreased bone density. There is no evidence of fracture or dislocation. Chondrocalcinosis. Radiocarpal and ulnocarpal degenerative changes. Scaphoid trapezoid and trapezium joint degenerative changes. Subcutaneus soft tissue edema of the wrist. IMPRESSION: No acute displaced fracture or dislocation. Electronically Signed   By: Iven Finn M.D.   On: 10/03/2022 22:02   CT Head Wo Contrast  Result Date: 10/03/2022 CLINICAL DATA:  Delirium, fall EXAM: CT HEAD WITHOUT CONTRAST TECHNIQUE: Contiguous axial images were obtained from the base of the skull through the vertex without  intravenous contrast. RADIATION DOSE REDUCTION: This exam was performed according to the departmental dose-optimization program which includes automated exposure control, adjustment of the mA and/or kV according to patient size and/or use of iterative reconstruction technique. COMPARISON:  09/20/2022 FINDINGS: Brain: No evidence of acute infarction, hemorrhage, hydrocephalus, extra-axial collection or mass lesion/mass effect. Patchy low-density changes within the periventricular and subcortical white matter compatible with chronic microvascular ischemic change. Mild diffuse cerebral  volume loss. Vascular: Atherosclerotic calcifications involving the large vessels of the skull base. No unexpected hyperdense vessel. Skull: Normal. Negative for fracture or focal lesion. Sinuses/Orbits: No acute finding. Other: Negative for scalp hematoma. IMPRESSION: 1. No acute intracranial findings. 2. Chronic microvascular ischemic change and cerebral volume loss. Electronically Signed   By: Davina Poke D.O.   On: 10/03/2022 21:59   DG Pelvis 1-2 Views  Result Date: 10/03/2022 CLINICAL DATA:  fall EXAM: PELVIS - 1-2 VIEW COMPARISON:  X-ray pelvis 08/06/2021 FINDINGS: There is no evidence of pelvic fracture or diastasis. Dislocation of the hips. No pelvic bone lesions are seen. IMPRESSION: Negative for acute traumatic injury. Electronically Signed   By: Iven Finn M.D.   On: 10/03/2022 21:59   DG Chest Portable 1 View  Result Date: 10/03/2022 CLINICAL DATA:  fall EXAM: PORTABLE CHEST 1 VIEW COMPARISON:  Chest x-ray 09/20/2022 FINDINGS: The heart and mediastinal contours are unchanged. Aortic calcification. Low lung volumes. No focal consolidation. Chronic coarsened interstitial markings with no overt pulmonary edema. No pleural effusion. No pneumothorax. No acute osseous abnormality. Bilateral shoulder degenerative changes. IMPRESSION: 1. Low lung volumes with no active disease. 2.  Aortic Atherosclerosis  (ICD10-I70.0). Electronically Signed   By: Iven Finn M.D.   On: 10/03/2022 21:58   DG Chest Portable 1 View  Result Date: 09/20/2022 CLINICAL DATA:  Weakness EXAM: PORTABLE CHEST 1 VIEW COMPARISON:  08/18/2022 FINDINGS: Stable cardiomegaly. Aortic atherosclerosis. Very low lung volumes. Mild bibasilar atelectasis. No pleural effusion or pneumothorax. IMPRESSION: Very low lung volumes with mild bibasilar atelectasis. Electronically Signed   By: Davina Poke D.O.   On: 09/20/2022 15:56   CT Head Wo Contrast  Result Date: 09/20/2022 CLINICAL DATA:  Altered mental status. EXAM: CT HEAD WITHOUT CONTRAST TECHNIQUE: Contiguous axial images were obtained from the base of the skull through the vertex without intravenous contrast. RADIATION DOSE REDUCTION: This exam was performed according to the departmental dose-optimization program which includes automated exposure control, adjustment of the mA and/or kV according to patient size and/or use of iterative reconstruction technique. COMPARISON:  07/06/2022 FINDINGS: Brain: Stable age related cerebral atrophy, ventriculomegaly and periventricular white matter disease. No extra-axial fluid collections are identified. No CT findings for acute hemispheric infarction or intracranial hemorrhage. No mass lesions. The brainstem and cerebellum are normal. Vascular: Stable advanced vascular calcifications but no aneurysm or hyperdense vessels. Skull: No skull fracture or bone lesions. Sinuses/Orbits: The paranasal sinuses and mastoid air cells are clear. The globes are intact. Other: No scalp lesions or scalp hematoma. IMPRESSION: 1. Stable age related cerebral atrophy, ventriculomegaly and periventricular white matter disease. 2. No acute intracranial findings or mass lesions. Electronically Signed   By: Marijo Sanes M.D.   On: 09/20/2022 15:45    Micro Results   Recent Results (from the past 240 hour(s))  Resp Panel by RT-PCR (Flu A&B, Covid) Anterior Nasal  Swab     Status: Abnormal   Collection Time: 10/03/22  7:34 PM   Specimen: Anterior Nasal Swab  Result Value Ref Range Status   SARS Coronavirus 2 by RT PCR POSITIVE (A) NEGATIVE Final    Comment: (NOTE) SARS-CoV-2 target nucleic acids are DETECTED.  The SARS-CoV-2 RNA is generally detectable in upper respiratory specimens during the acute phase of infection. Positive results are indicative of the presence of the identified virus, but do not rule out bacterial infection or co-infection with other pathogens not detected by the test. Clinical correlation with patient history and other diagnostic information is  necessary to determine patient infection status. The expected result is Negative.  Fact Sheet for Patients: EntrepreneurPulse.com.au  Fact Sheet for Healthcare Providers: IncredibleEmployment.be  This test is not yet approved or cleared by the [place] FDA and  has been authorized for detection and/or diagnosis of SARS-CoV-2 by FDA under an Emergency Use Authorization (EUA).  This EUA will remain in effect (meaning this test can be used) for the duration of  the COVID-19 declaration under Section 564(b)(1) of the A ct, 21 U.S.C. section 360bbb-3(b)(1), unless the authorization is terminated or revoked sooner.     Influenza A by PCR NEGATIVE NEGATIVE Final   Influenza B by PCR NEGATIVE NEGATIVE Final    Comment: (NOTE) The Xpert Xpress SARS-CoV-2/FLU/RSV plus assay is intended as an aid in the diagnosis of influenza from Nasopharyngeal swab specimens and should not be used as a sole basis for treatment. Nasal washings and aspirates are unacceptable for Xpert Xpress SARS-CoV-2/FLU/RSV testing.  Fact Sheet for Patients: EntrepreneurPulse.com.au  Fact Sheet for Healthcare Providers: IncredibleEmployment.be  This test is not yet approved or cleared by the [place] FDA and has been authorized  for detection and/or diagnosis of SARS-CoV-2 by FDA under an Emergency Use Authorization (EUA). This EUA will remain in effect (meaning this test can be used) for the duration of the COVID-19 declaration under Section 564(b)(1) of the Act, 21 U.S.C. section 360bbb-3(b)(1), unless the authorization is terminated or revoked.  Performed at [facility], [address], [place], [place] [address]   Urine Culture     Status: Abnormal   Collection Time: 10/03/22 10:13 PM   Specimen: Urine, Clean Catch  Result Value Ref Range Status   Specimen Description   Final    URINE, CLEAN CATCH Performed at [facility], [address] [place], [place] [address]    Special Requests   Final    NONE Performed at [facility], [address], [place], [place] [address]    Culture (A)  Final    >=100,000 COLONIES/mL KLEBSIELLA PNEUMONIAE CONFIRMED CARBAPENEMASE RESISTANT ENTEROBACTERIACAE    Report Status 10/07/2022 FINAL  Final   Organism ID, Bacteria KLEBSIELLA PNEUMONIAE (A)  Final      Susceptibility   Klebsiella pneumoniae - MIC*    AMPICILLIN >=32 RESISTANT Resistant     CEFAZOLIN >=64 RESISTANT Resistant     CEFEPIME >=32 RESISTANT Resistant     CEFTRIAXONE >=64 RESISTANT Resistant     CIPROFLOXACIN <=0.25 SENSITIVE Sensitive     GENTAMICIN <=1 SENSITIVE Sensitive     IMIPENEM >=16 RESISTANT Resistant     NITROFURANTOIN 32 SENSITIVE Sensitive     TRIMETH/SULFA <=20 SENSITIVE Sensitive     AMPICILLIN/SULBACTAM >=32 RESISTANT Resistant     PIP/TAZO >=128 RESISTANT Resistant     * >=100,000 COLONIES/mL KLEBSIELLA PNEUMONIAE  Carbapenem Resistance Panel     Status: Abnormal   Collection Time: 10/03/22 10:13 PM  Result Value Ref Range Status   Carba Resistance IMP Gene NOT DETECTED NOT DETECTED Final   Carba Resistance VIM Gene NOT DETECTED NOT DETECTED Final   Carba Resistance NDM Gene NOT DETECTED NOT DETECTED Final   Carba Resistance KPC Gene DETECTED (A) NOT DETECTED Final    Comment:  CRITICAL RESULT CALLED TO, READ BACK BY AND VERIFIED WITH: PHARMD HURTH [phone] AT 1304 BY EC    Carba Resistance OXA48 Gene NOT DETECTED NOT DETECTED Final    Comment: (NOTE) Cepheid Carba-R is an FDA-cleared nucleic acid amplification test  (NAAT)for the detection and differentiation  of genes encoding the  most prevalent carbapenemases in bacterial isolate samples. Carbapenemase gene identification and implementation of comprehensive  infection control measures are recommended by the CDC to prevent the  spread of the resistant organisms. Performed at [facility], [place] [address], [place], [place] [address]   Culture, blood (Routine X 2) w Reflex to ID Panel     Status: None (Preliminary result)   Collection Time: 10/04/22  1:20 AM   Specimen: BLOOD RIGHT WRIST  Result Value Ref Range Status   Specimen Description BLOOD RIGHT WRIST  Final   Special Requests   Final    BOTTLES DRAWN AEROBIC AND ANAEROBIC Blood Culture results may not be optimal due to an inadequate volume of blood received in culture bottles   Culture   Final    NO GROWTH 4 DAYS Performed at [facility], [address], [place], [place] [address]    Report Status PENDING  Incomplete  Culture, blood (Routine X 2) w Reflex to ID Panel     Status: None (Preliminary result)   Collection Time: 10/04/22  1:20 AM   Specimen: BLOOD RIGHT HAND  Result Value Ref Range Status   Specimen Description BLOOD RIGHT HAND  Final   Special Requests   Final    BOTTLES DRAWN AEROBIC AND ANAEROBIC Blood Culture results may not be optimal due to an inadequate volume of blood received in culture bottles   Culture   Final    NO GROWTH 4 DAYS Performed at [facility], [address], [place], [place] [address]    Report Status PENDING  Incomplete   Today   Subjective    Jaynee Duggin today has no new complaints  No fever  Or chills   No Nausea, Vomiting or Diarrhea        Patient has been seen and examined prior to  discharge   Objective   Blood pressure (!) 157/91, pulse (!) 108, temperature 98.1 F (36.7 C), resp. rate 19, height 5' (1.524 m), weight 60.4 kg, SpO2 96 %.   Intake/Output Summary (Last 24 hours) at 10/08/2022 1123 Last data filed at 10/07/2022 2212 Gross per 24 hour  Intake 180 ml  Output --  Net 180 ml   Exam Gen:-Awake, alert, chronically ill and frail appearing HEENT:- [place].AT, No sclera icterus Neck-Supple Neck,No JVD,.  Lungs-  CTAB , fair symmetrical air movement CV- S1, S2 normal, irregular  Abd-  +ve B.Sounds, Abd Soft, No tenderness, no CVA tenderness Extremity/Skin:- No  edema, pedal pulses present  Psych-affect is appropriate, oriented x3,, baseline/underlying memory and cognitive deficits noted  neuro-generalized weakness, no new focal deficits, no tremors   Data Review   CBC w Diff:  Lab Results  Component Value Date   WBC 8.6 10/08/2022   HGB 9.2 (L) 10/08/2022   HCT 27.8 (L) 10/08/2022   PLT 367 10/08/2022   LYMPHOPCT 4 10/03/2022   BANDSPCT 11 09/22/2022   MONOPCT 5 10/03/2022   EOSPCT 0 10/03/2022   BASOPCT 0 10/03/2022    CMP:  Lab Results  Component Value Date   NA 131 (L) 10/08/2022   K 4.1 10/08/2022   CL 101 10/08/2022   CO2 23 10/08/2022   BUN 19 10/08/2022   CREATININE 0.83 10/08/2022   PROT 6.7 10/04/2022   ALBUMIN 3.1 (L) 10/04/2022   BILITOT 1.0 10/04/2022   ALKPHOS 50 10/04/2022   AST 14 (L) 10/04/2022   ALT 14 10/04/2022   Total Discharge time is about  33 minutes  Roxan Hockey M.D on 10/08/2022 at 11:23 AM  Go to [email or web address] -  for contact info  [facility] - Office  [phone]

## 2022-10-08 NOTE — Progress Notes (Signed)
Report given to [facility] and rehab.

## 2022-10-09 DIAGNOSIS — R5381 Other malaise: Secondary | ICD-10-CM | POA: Diagnosis not present

## 2022-10-09 DIAGNOSIS — N390 Urinary tract infection, site not specified: Secondary | ICD-10-CM | POA: Diagnosis not present

## 2022-10-09 DIAGNOSIS — G9341 Metabolic encephalopathy: Secondary | ICD-10-CM | POA: Diagnosis not present

## 2022-10-09 LAB — CULTURE, BLOOD (ROUTINE X 2)
Culture: NO GROWTH
Culture: NO GROWTH

## 2022-10-18 ENCOUNTER — Other Ambulatory Visit: Payer: Self-pay | Admitting: *Deleted

## 2022-10-18 DIAGNOSIS — U071 COVID-19: Secondary | ICD-10-CM | POA: Diagnosis not present

## 2022-10-18 DIAGNOSIS — E860 Dehydration: Secondary | ICD-10-CM | POA: Diagnosis not present

## 2022-10-18 DIAGNOSIS — J9811 Atelectasis: Secondary | ICD-10-CM | POA: Diagnosis not present

## 2022-10-18 DIAGNOSIS — E871 Hypo-osmolality and hyponatremia: Secondary | ICD-10-CM | POA: Diagnosis not present

## 2022-10-18 DIAGNOSIS — G9341 Metabolic encephalopathy: Secondary | ICD-10-CM | POA: Diagnosis not present

## 2022-10-18 DIAGNOSIS — N3000 Acute cystitis without hematuria: Secondary | ICD-10-CM | POA: Diagnosis not present

## 2022-10-18 DIAGNOSIS — A414 Sepsis due to anaerobes: Secondary | ICD-10-CM | POA: Diagnosis not present

## 2022-10-18 DIAGNOSIS — J9601 Acute respiratory failure with hypoxia: Secondary | ICD-10-CM | POA: Diagnosis not present

## 2022-10-18 NOTE — Patient Outreach (Signed)
[name] resides in [facility]. Screening for potential [facility] care coordination services as benefit of insurance plan and PCP.   Update received from SNF social worker indicating [name] will return home on 10/22/22.   Will plan outreach to daughter to discuss [facility] care coordination services. Will also inquire what home health agency will be arranged.   Marthenia Rolling, MSN, RN,BSN [facility] Coordinator [phone] (Direct dial)

## 2022-10-22 DIAGNOSIS — E119 Type 2 diabetes mellitus without complications: Secondary | ICD-10-CM | POA: Diagnosis not present

## 2022-10-22 DIAGNOSIS — G9341 Metabolic encephalopathy: Secondary | ICD-10-CM | POA: Diagnosis not present

## 2022-10-22 DIAGNOSIS — I10 Essential (primary) hypertension: Secondary | ICD-10-CM | POA: Diagnosis not present

## 2022-10-22 DIAGNOSIS — R5381 Other malaise: Secondary | ICD-10-CM | POA: Diagnosis not present

## 2022-10-22 DIAGNOSIS — N390 Urinary tract infection, site not specified: Secondary | ICD-10-CM | POA: Diagnosis not present

## 2022-10-22 DIAGNOSIS — E785 Hyperlipidemia, unspecified: Secondary | ICD-10-CM | POA: Diagnosis not present

## 2022-10-22 DIAGNOSIS — I4891 Unspecified atrial fibrillation: Secondary | ICD-10-CM | POA: Diagnosis not present

## 2022-10-24 DIAGNOSIS — I4891 Unspecified atrial fibrillation: Secondary | ICD-10-CM | POA: Diagnosis not present

## 2022-10-24 DIAGNOSIS — E1165 Type 2 diabetes mellitus with hyperglycemia: Secondary | ICD-10-CM | POA: Diagnosis not present

## 2022-10-24 DIAGNOSIS — N390 Urinary tract infection, site not specified: Secondary | ICD-10-CM | POA: Diagnosis not present

## 2022-10-24 DIAGNOSIS — U071 COVID-19: Secondary | ICD-10-CM | POA: Diagnosis not present

## 2022-10-24 DIAGNOSIS — E871 Hypo-osmolality and hyponatremia: Secondary | ICD-10-CM | POA: Diagnosis not present

## 2022-10-24 DIAGNOSIS — I10 Essential (primary) hypertension: Secondary | ICD-10-CM | POA: Diagnosis not present

## 2022-10-24 DIAGNOSIS — G9341 Metabolic encephalopathy: Secondary | ICD-10-CM | POA: Diagnosis not present

## 2022-10-24 DIAGNOSIS — R410 Disorientation, unspecified: Secondary | ICD-10-CM | POA: Diagnosis not present

## 2022-11-07 ENCOUNTER — Ambulatory Visit: Payer: Medicare Other | Attending: Cardiology | Admitting: Cardiology

## 2022-11-07 ENCOUNTER — Encounter: Payer: Self-pay | Admitting: Cardiology

## 2022-11-07 VITALS — BP 120/80 | HR 93 | Ht 60.0 in | Wt 134.0 lb

## 2022-11-07 DIAGNOSIS — E782 Mixed hyperlipidemia: Secondary | ICD-10-CM | POA: Insufficient documentation

## 2022-11-07 DIAGNOSIS — I4821 Permanent atrial fibrillation: Secondary | ICD-10-CM | POA: Diagnosis not present

## 2022-11-07 MED ORDER — APIXABAN 2.5 MG PO TABS: 2.5000 mg | ORAL_TABLET | Freq: Two times a day (BID) | ORAL | 6 refills | Status: DC

## 2022-11-07 NOTE — Patient Instructions (Signed)
Medication Instructions:  Your physician has recommended you make the following change in your medication:  -Start Eliquis 2.5 mg tablets twice daily   Labwork: None  Testing/Procedures: None  Follow-Up: Follow up with Dr. Domenic Polite in 6 months.   Any Other Special Instructions Will Be Listed Below (If Applicable).     If you need a refill on your cardiac medications before your next appointment, please call your pharmacy.

## 2022-11-07 NOTE — Progress Notes (Signed)
Cardiology Office Note  Date: 11/07/2022   ID: [name], DOB 06-Dec-1926, MRN [id number]  PCP:  Manon Hilding, MD  Cardiologist:  Rozann Lesches, MD Electrophysiologist:  None   Chief Complaint  Patient presents with   Cardiac follow-up    History of Present Illness: [name] is a 88 y.o. female last seen in April by Ms. Strader PA-C, I reviewed the note.  She is here today with her daughter for a follow-up visit. She was recently hospitalized in November with encephalopathy in association with urosepsis and COVID-19.  From a cardiac perspective she does not report any chest discomfort, no sense of palpitations on current therapy including atenolol and Eliquis.  Her weight is right at 60 kg and we discussed going ahead and cutting Eliquis to 2.5 mg twice daily.  I did review her recent lab work as well.  Past Medical History:  Diagnosis Date   Atrial fibrillation ([place])    Coronary atherosclerosis of native coronary artery    Nonobstructive   Dyslipidemia    PSVT (paroxysmal supraventricular tachycardia)    TIA (transient ischemic attack)    Type 2 diabetes mellitus ([place])    Diet controlled    Past Surgical History:  Procedure Laterality Date   ABDOMINAL HYSTERECTOMY     CARPAL TUNNEL RELEASE     x's 2   CHOLECYSTECTOMY     REPLACEMENT TOTAL KNEE  2002 & 2012   SHOULDER SURGERY  2001    Current Outpatient Medications  Medication Sig Dispense Refill   acetaminophen (TYLENOL) 325 MG tablet Take 2 tablets (650 mg total) by mouth every 6 (six) hours as needed for mild pain, fever or headache (or Fever >/= 101). 100 tablet 0   apixaban (ELIQUIS) 2.5 MG TABS tablet Take 1 tablet (2.5 mg total) by mouth 2 (two) times daily. 60 tablet 6   atenolol (TENORMIN) 100 MG tablet Take 1 tablet (100 mg total) by mouth 2 (two) times daily. 180 tablet 3   metFORMIN (GLUCOPHAGE-XR) 500 MG 24 hr tablet Take 2 tabs (1,000mg ) by mouth every morning & 1 tab (500mg ) every evening      Multiple Vitamin (MULTIVITAMIN) LIQD Take 5 mLs by mouth daily.     simvastatin (ZOCOR) 10 MG tablet Take 10 mg by mouth at bedtime.     No current facility-administered medications for this visit.   Allergies:  Toprol xl [metoprolol succinate]   ROS: No syncope.  Physical Exam: VS:  BP 120/80 (BP Location: Left Arm, Patient Position: Sitting, Cuff Size: Normal)   Pulse 93   Ht 5' (1.524 m)   Wt 134 lb (60.8 kg)   SpO2 96%   BMI 26.17 kg/m , BMI Body mass index is 26.17 kg/m.  Wt Readings from Last 3 Encounters:  11/07/22 134 lb (60.8 kg)  10/04/22 133 lb 2.5 oz (60.4 kg)  09/20/22 135 lb 12.9 oz (61.6 kg)    General: Patient appears comfortable at rest. HEENT: Conjunctiva and lids normal. Neck: Supple, no elevated JVP or carotid bruits. Lungs: Clear to auscultation, nonlabored breathing at rest. Cardiac: Regular rate and rhythm, no S3 or significant systolic murmur, no pericardial rub. Extremities: No pitting edema.  ECG:  An ECG dated 10/03/2022 was personally reviewed today and demonstrated:  Atrial fibrillation with repolarization changes.  Recent Labwork: May 2023: Cholesterol 155, triglycerides 98, HDL 67, LDL 70 05/02/2022: TSH 1.430 10/04/2022: ALT 14; AST 14; Magnesium 1.2 10/08/2022: BUN 19; Creatinine, Ser 0.83; Hemoglobin 9.2;  Platelets 367; Potassium 4.1; Sodium 131   Other Studies Reviewed Today:  Echocardiogram 03/23/2020 [facility]): Summary   1. The left ventricle is normal in size with normal wall thickness.    2. The left ventricular systolic function is normal, LVEF is visually  estimated at 60-65%.    3. The left atrium is moderately dilated in size.    4. The aortic valve is trileaflet with mildly thickened leaflets with normal  excursion.   5. The right ventricle is normal in size, with normal systolic function.    6. The right atrium is mildly dilated  in size.   Assessment and Plan:  1.  Permanent atrial fibrillation with  CHA2DS2-VASc score of 7.  Plan to reduce Eliquis to 2.5 mg twice daily (weight right at 60 kg now) and otherwise continue atenolol which is providing good heart rate control at this point.  I reviewed her recent lab work.  2.  Mixed hyperlipidemia on Zocor.  She continues to follow with Dr. Quintin Alto.  LDL was 70 in May.  Medication Adjustments/Labs and Tests Ordered: Current medicines are reviewed at length with the patient today.  Concerns regarding medicines are outlined above.   Tests Ordered: No orders of the defined types were placed in this encounter.   Medication Changes: Meds ordered this encounter  Medications   apixaban (ELIQUIS) 2.5 MG TABS tablet    Sig: Take 1 tablet (2.5 mg total) by mouth 2 (two) times daily.    Dispense:  60 tablet    Refill:  6    Disposition:  Follow up  6 months.  Signed, Satira Sark, MD, [place] 11/07/2022 3:21 PM    [facility] at [facility] [address], [place], [place] [address] Phone: [phone]; Fax: [phone]

## 2022-11-16 DIAGNOSIS — R300 Dysuria: Secondary | ICD-10-CM | POA: Diagnosis not present

## 2022-11-23 ENCOUNTER — Inpatient Hospital Stay (HOSPITAL_COMMUNITY): Payer: Medicare Other

## 2022-11-23 ENCOUNTER — Encounter (HOSPITAL_COMMUNITY): Payer: Self-pay

## 2022-11-23 ENCOUNTER — Emergency Department (HOSPITAL_COMMUNITY): Payer: Medicare Other

## 2022-11-23 ENCOUNTER — Inpatient Hospital Stay: Payer: Self-pay

## 2022-11-23 ENCOUNTER — Other Ambulatory Visit: Payer: Self-pay

## 2022-11-23 ENCOUNTER — Inpatient Hospital Stay (HOSPITAL_COMMUNITY)
Admission: EM | Admit: 2022-11-23 | Discharge: 2022-11-26 | DRG: 871 | Disposition: A | Discharge status: Home Health | Payer: Medicare Other | Attending: Family Medicine | Admitting: Family Medicine

## 2022-11-23 DIAGNOSIS — G9341 Metabolic encephalopathy: Secondary | ICD-10-CM | POA: Diagnosis present

## 2022-11-23 DIAGNOSIS — R6520 Severe sepsis without septic shock: Secondary | ICD-10-CM

## 2022-11-23 DIAGNOSIS — R918 Other nonspecific abnormal finding of lung field: Secondary | ICD-10-CM | POA: Diagnosis not present

## 2022-11-23 DIAGNOSIS — G9340 Encephalopathy, unspecified: Secondary | ICD-10-CM | POA: Diagnosis not present

## 2022-11-23 DIAGNOSIS — D638 Anemia in other chronic diseases classified elsewhere: Secondary | ICD-10-CM | POA: Diagnosis present

## 2022-11-23 DIAGNOSIS — N2889 Other specified disorders of kidney and ureter: Secondary | ICD-10-CM | POA: Diagnosis not present

## 2022-11-23 DIAGNOSIS — E1122 Type 2 diabetes mellitus with diabetic chronic kidney disease: Secondary | ICD-10-CM | POA: Diagnosis present

## 2022-11-23 DIAGNOSIS — N1831 Chronic kidney disease, stage 3a: Secondary | ICD-10-CM | POA: Diagnosis present

## 2022-11-23 DIAGNOSIS — Z7984 Long term (current) use of oral hypoglycemic drugs: Secondary | ICD-10-CM

## 2022-11-23 DIAGNOSIS — Z8249 Family history of ischemic heart disease and other diseases of the circulatory system: Secondary | ICD-10-CM

## 2022-11-23 DIAGNOSIS — I2510 Atherosclerotic heart disease of native coronary artery without angina pectoris: Secondary | ICD-10-CM | POA: Diagnosis present

## 2022-11-23 DIAGNOSIS — Z90710 Acquired absence of both cervix and uterus: Secondary | ICD-10-CM

## 2022-11-23 DIAGNOSIS — Z8673 Personal history of transient ischemic attack (TIA), and cerebral infarction without residual deficits: Secondary | ICD-10-CM

## 2022-11-23 DIAGNOSIS — K769 Liver disease, unspecified: Secondary | ICD-10-CM | POA: Diagnosis not present

## 2022-11-23 DIAGNOSIS — Z1152 Encounter for screening for COVID-19: Secondary | ICD-10-CM | POA: Diagnosis not present

## 2022-11-23 DIAGNOSIS — N183 Chronic kidney disease, stage 3 unspecified: Secondary | ICD-10-CM | POA: Diagnosis not present

## 2022-11-23 DIAGNOSIS — E860 Dehydration: Secondary | ICD-10-CM | POA: Diagnosis present

## 2022-11-23 DIAGNOSIS — I4891 Unspecified atrial fibrillation: Secondary | ICD-10-CM | POA: Diagnosis not present

## 2022-11-23 DIAGNOSIS — D631 Anemia in chronic kidney disease: Secondary | ICD-10-CM | POA: Diagnosis present

## 2022-11-23 DIAGNOSIS — E785 Hyperlipidemia, unspecified: Secondary | ICD-10-CM | POA: Diagnosis present

## 2022-11-23 DIAGNOSIS — E7801 Familial hypercholesterolemia: Secondary | ICD-10-CM | POA: Diagnosis not present

## 2022-11-23 DIAGNOSIS — Z79899 Other long term (current) drug therapy: Secondary | ICD-10-CM

## 2022-11-23 DIAGNOSIS — J984 Other disorders of lung: Secondary | ICD-10-CM | POA: Diagnosis not present

## 2022-11-23 DIAGNOSIS — R000 Tachycardia, unspecified: Secondary | ICD-10-CM | POA: Diagnosis not present

## 2022-11-23 DIAGNOSIS — I480 Paroxysmal atrial fibrillation: Secondary | ICD-10-CM | POA: Diagnosis present

## 2022-11-23 DIAGNOSIS — A419 Sepsis, unspecified organism: Secondary | ICD-10-CM | POA: Diagnosis not present

## 2022-11-23 DIAGNOSIS — D649 Anemia, unspecified: Secondary | ICD-10-CM | POA: Diagnosis not present

## 2022-11-23 DIAGNOSIS — I959 Hypotension, unspecified: Secondary | ICD-10-CM | POA: Diagnosis not present

## 2022-11-23 DIAGNOSIS — N3000 Acute cystitis without hematuria: Secondary | ICD-10-CM | POA: Diagnosis present

## 2022-11-23 DIAGNOSIS — Z6829 Body mass index (BMI) 29.0-29.9, adult: Secondary | ICD-10-CM

## 2022-11-23 DIAGNOSIS — E1165 Type 2 diabetes mellitus with hyperglycemia: Secondary | ICD-10-CM | POA: Diagnosis not present

## 2022-11-23 DIAGNOSIS — I482 Chronic atrial fibrillation, unspecified: Secondary | ICD-10-CM | POA: Diagnosis present

## 2022-11-23 DIAGNOSIS — E872 Acidosis, unspecified: Secondary | ICD-10-CM | POA: Diagnosis present

## 2022-11-23 DIAGNOSIS — I517 Cardiomegaly: Secondary | ICD-10-CM | POA: Diagnosis not present

## 2022-11-23 DIAGNOSIS — I130 Hypertensive heart and chronic kidney disease with heart failure and stage 1 through stage 4 chronic kidney disease, or unspecified chronic kidney disease: Secondary | ICD-10-CM | POA: Diagnosis present

## 2022-11-23 DIAGNOSIS — I700 Atherosclerosis of aorta: Secondary | ICD-10-CM | POA: Diagnosis not present

## 2022-11-23 DIAGNOSIS — Z7901 Long term (current) use of anticoagulants: Secondary | ICD-10-CM

## 2022-11-23 DIAGNOSIS — I5032 Chronic diastolic (congestive) heart failure: Secondary | ICD-10-CM | POA: Diagnosis present

## 2022-11-23 DIAGNOSIS — I10 Essential (primary) hypertension: Secondary | ICD-10-CM | POA: Diagnosis present

## 2022-11-23 DIAGNOSIS — E7800 Pure hypercholesterolemia, unspecified: Secondary | ICD-10-CM | POA: Diagnosis not present

## 2022-11-23 DIAGNOSIS — N390 Urinary tract infection, site not specified: Secondary | ICD-10-CM | POA: Diagnosis present

## 2022-11-23 DIAGNOSIS — Z888 Allergy status to other drugs, medicaments and biological substances status: Secondary | ICD-10-CM

## 2022-11-23 DIAGNOSIS — Z452 Encounter for adjustment and management of vascular access device: Secondary | ICD-10-CM | POA: Diagnosis not present

## 2022-11-23 DIAGNOSIS — R531 Weakness: Secondary | ICD-10-CM

## 2022-11-23 DIAGNOSIS — F05 Delirium due to known physiological condition: Secondary | ICD-10-CM | POA: Diagnosis not present

## 2022-11-23 DIAGNOSIS — E876 Hypokalemia: Secondary | ICD-10-CM | POA: Diagnosis not present

## 2022-11-23 DIAGNOSIS — E119 Type 2 diabetes mellitus without complications: Secondary | ICD-10-CM

## 2022-11-23 LAB — CBC WITH DIFFERENTIAL/PLATELET
Abs Immature Granulocytes: 0.09 10*3/uL — ABNORMAL HIGH (ref 0.00–0.07)
Basophils Absolute: 0.1 10*3/uL (ref 0.0–0.1)
Basophils Relative: 0 %
Eosinophils Absolute: 0 10*3/uL (ref 0.0–0.5)
Eosinophils Relative: 0 %
HCT: 36.2 % (ref 36.0–46.0)
Hemoglobin: 11.6 g/dL — ABNORMAL LOW (ref 12.0–15.0)
Immature Granulocytes: 1 %
Lymphocytes Relative: 1 %
Lymphs Abs: 0.1 10*3/uL — ABNORMAL LOW (ref 0.7–4.0)
MCH: 29.7 pg (ref 26.0–34.0)
MCHC: 32 g/dL (ref 30.0–36.0)
MCV: 92.8 fL (ref 80.0–100.0)
Monocytes Absolute: 0.3 10*3/uL (ref 0.1–1.0)
Monocytes Relative: 2 %
Neutro Abs: 13.5 10*3/uL — ABNORMAL HIGH (ref 1.7–7.7)
Neutrophils Relative %: 96 %
Platelets: 313 10*3/uL (ref 150–400)
RBC: 3.9 MIL/uL (ref 3.87–5.11)
RDW: 16 % — ABNORMAL HIGH (ref 11.5–15.5)
WBC: 14 10*3/uL — ABNORMAL HIGH (ref 4.0–10.5)
nRBC: 0 % (ref 0.0–0.2)

## 2022-11-23 LAB — COMPREHENSIVE METABOLIC PANEL
ALT: 157 U/L — ABNORMAL HIGH (ref 0–44)
AST: 320 U/L — ABNORMAL HIGH (ref 15–41)
Albumin: 3.3 g/dL — ABNORMAL LOW (ref 3.5–5.0)
Alkaline Phosphatase: 54 U/L (ref 38–126)
Anion gap: 14 (ref 5–15)
BUN: 35 mg/dL — ABNORMAL HIGH (ref 8–23)
CO2: 19 mmol/L — ABNORMAL LOW (ref 22–32)
Calcium: 8.9 mg/dL (ref 8.9–10.3)
Chloride: 98 mmol/L (ref 98–111)
Creatinine, Ser: 1.22 mg/dL — ABNORMAL HIGH (ref 0.44–1.00)
GFR, Estimated: 41 mL/min — ABNORMAL LOW (ref >60)
Glucose, Bld: 195 mg/dL — ABNORMAL HIGH (ref 70–99)
Potassium: 4.4 mmol/L (ref 3.5–5.1)
Sodium: 131 mmol/L — ABNORMAL LOW (ref 135–145)
Total Bilirubin: 1.5 mg/dL — ABNORMAL HIGH (ref 0.3–1.2)
Total Protein: 6.9 g/dL (ref 6.5–8.1)

## 2022-11-23 LAB — URINALYSIS, ROUTINE W REFLEX MICROSCOPIC
Bacteria, UA: NONE SEEN
Bilirubin Urine: NEGATIVE
Glucose, UA: NEGATIVE mg/dL
Hgb urine dipstick: NEGATIVE
Ketones, ur: NEGATIVE mg/dL
Nitrite: NEGATIVE
Protein, ur: 30 mg/dL — AB
Specific Gravity, Urine: 1.01 (ref 1.005–1.030)
pH: 6 (ref 5.0–8.0)

## 2022-11-23 LAB — PROTIME-INR
INR: 1.2 (ref 0.8–1.2)
INR: 1.3 — ABNORMAL HIGH (ref 0.8–1.2)
Prothrombin Time: 15.3 seconds — ABNORMAL HIGH (ref 11.4–15.2)
Prothrombin Time: 16.3 seconds — ABNORMAL HIGH (ref 11.4–15.2)

## 2022-11-23 LAB — GLUCOSE, CAPILLARY
Glucose-Capillary: 141 mg/dL — ABNORMAL HIGH (ref 70–99)
Glucose-Capillary: 123 mg/dL — ABNORMAL HIGH (ref 70–99)
Glucose-Capillary: 145 mg/dL — ABNORMAL HIGH (ref 70–99)

## 2022-11-23 LAB — CBC
HCT: 36.6 % (ref 36.0–46.0)
Hemoglobin: 11.5 g/dL — ABNORMAL LOW (ref 12.0–15.0)
MCH: 29.9 pg (ref 26.0–34.0)
MCHC: 31.4 g/dL (ref 30.0–36.0)
MCV: 95.1 fL (ref 80.0–100.0)
Platelets: 268 10*3/uL (ref 150–400)
RBC: 3.85 MIL/uL — ABNORMAL LOW (ref 3.87–5.11)
RDW: 16.2 % — ABNORMAL HIGH (ref 11.5–15.5)
WBC: 11.5 10*3/uL — ABNORMAL HIGH (ref 4.0–10.5)
nRBC: 0 % (ref 0.0–0.2)

## 2022-11-23 LAB — APTT: aPTT: 31 seconds (ref 24–36)

## 2022-11-23 LAB — RESP PANEL BY RT-PCR (RSV, FLU A&B, COVID)  RVPGX2
Influenza A by PCR: NEGATIVE
Influenza B by PCR: NEGATIVE
Resp Syncytial Virus by PCR: NEGATIVE
SARS Coronavirus 2 by RT PCR: NEGATIVE

## 2022-11-23 LAB — LACTIC ACID, PLASMA
Lactic Acid, Venous: 5.2 mmol/L (ref 0.5–1.9)
Lactic Acid, Venous: 5.2 mmol/L (ref 0.5–1.9)
Lactic Acid, Venous: 2.9 mmol/L (ref 0.5–1.9)

## 2022-11-23 LAB — PHOSPHORUS: Phosphorus: 3.6 mg/dL (ref 2.5–4.6)

## 2022-11-23 LAB — MRSA NEXT GEN BY PCR, NASAL: MRSA by PCR Next Gen: NOT DETECTED

## 2022-11-23 LAB — CK: Total CK: 24 U/L — ABNORMAL LOW (ref 38–234)

## 2022-11-23 LAB — PROCALCITONIN: Procalcitonin: 61.33 ng/mL

## 2022-11-23 LAB — MAGNESIUM: Magnesium: 1 mg/dL — ABNORMAL LOW (ref 1.7–2.4)

## 2022-11-23 MED ORDER — SENNOSIDES-DOCUSATE SODIUM 8.6-50 MG PO TABS: 1.0000 | ORAL_TABLET | Freq: Every evening | ORAL | Status: DC | PRN

## 2022-11-23 MED ORDER — IPRATROPIUM BROMIDE 0.02 % IN SOLN: 0.5000 mg | Freq: Four times a day (QID) | RESPIRATORY_TRACT | Status: DC | PRN

## 2022-11-23 MED ORDER — LACTATED RINGERS IV BOLUS
1000.0000 mL | Freq: Once | INTRAVENOUS | Status: AC
  Administered 2022-11-23: 1000 mL via INTRAVENOUS

## 2022-11-23 MED ORDER — ONDANSETRON HCL 4 MG/2ML IJ SOLN: 4.0000 mg | Freq: Four times a day (QID) | INTRAMUSCULAR | Status: DC | PRN

## 2022-11-23 MED ORDER — BISACODYL 5 MG PO TBEC: 5.0000 mg | DELAYED_RELEASE_TABLET | Freq: Every day | ORAL | Status: DC | PRN

## 2022-11-23 MED ORDER — VANCOMYCIN HCL IN DEXTROSE 1-5 GM/200ML-% IV SOLN: 1000.0000 mg | Freq: Once | INTRAVENOUS | Status: DC

## 2022-11-23 MED ORDER — SODIUM CHLORIDE 0.9% FLUSH
3.0000 mL | Freq: Two times a day (BID) | INTRAVENOUS | Status: DC
  Administered 2022-11-23 – 2022-11-26 (×6): 3 mL via INTRAVENOUS

## 2022-11-23 MED ORDER — SODIUM CHLORIDE 0.9% FLUSH: 3.0000 mL | INTRAVENOUS | Status: DC | PRN

## 2022-11-23 MED ORDER — SODIUM CHLORIDE 0.9 % IV SOLN: 250.0000 mL | INTRAVENOUS | Status: DC | PRN

## 2022-11-23 MED ORDER — HYDRALAZINE HCL 20 MG/ML IJ SOLN: 10.0000 mg | INTRAMUSCULAR | Status: DC | PRN

## 2022-11-23 MED ORDER — HEPARIN SODIUM (PORCINE) 5000 UNIT/ML IJ SOLN: 5000.0000 [IU] | Freq: Three times a day (TID) | INTRAMUSCULAR | Status: DC

## 2022-11-23 MED ORDER — ORAL CARE MOUTH RINSE: 15.0000 mL | OROMUCOSAL | Status: DC | PRN

## 2022-11-23 MED ORDER — INSULIN ASPART 100 UNIT/ML IJ SOLN
0.0000 [IU] | Freq: Three times a day (TID) | INTRAMUSCULAR | Status: DC
  Administered 2022-11-24 – 2022-11-26 (×4): 1 [IU] via SUBCUTANEOUS

## 2022-11-23 MED ORDER — SODIUM CHLORIDE 0.9 % IV SOLN: 2.0000 g | Freq: Once | INTRAVENOUS | Status: DC

## 2022-11-23 MED ORDER — LACTATED RINGERS IV SOLN: INTRAVENOUS | Status: AC

## 2022-11-23 MED ORDER — LACTATED RINGERS IV BOLUS (SEPSIS)
1000.0000 mL | Freq: Once | INTRAVENOUS | Status: AC
  Administered 2022-11-23: 1000 mL via INTRAVENOUS

## 2022-11-23 MED ORDER — SODIUM CHLORIDE 0.9 % IV SOLN
2.0000 g | INTRAVENOUS | Status: DC
  Administered 2022-11-24 – 2022-11-25 (×2): 2 g via INTRAVENOUS
  Filled 2022-11-23 (×2): qty 12.5

## 2022-11-23 MED ORDER — FLEET ENEMA 7-19 GM/118ML RE ENEM: 1.0000 | ENEMA | Freq: Once | RECTAL | Status: DC | PRN

## 2022-11-23 MED ORDER — CHLORHEXIDINE GLUCONATE CLOTH 2 % EX PADS
6.0000 | MEDICATED_PAD | Freq: Every day | CUTANEOUS | Status: DC
  Administered 2022-11-23 – 2022-11-26 (×3): 6 via TOPICAL

## 2022-11-23 MED ORDER — MAGNESIUM SULFATE 2 GM/50ML IV SOLN
2.0000 g | Freq: Once | INTRAVENOUS | Status: AC
  Administered 2022-11-23: 2 g via INTRAVENOUS
  Filled 2022-11-23: qty 50

## 2022-11-23 MED ORDER — VANCOMYCIN HCL 1250 MG/250ML IV SOLN
1250.0000 mg | Freq: Once | INTRAVENOUS | Status: AC
  Administered 2022-11-23: 1250 mg via INTRAVENOUS
  Filled 2022-11-23: qty 250

## 2022-11-23 MED ORDER — HYDROMORPHONE HCL 1 MG/ML IJ SOLN: 0.5000 mg | INTRAMUSCULAR | Status: DC | PRN

## 2022-11-23 MED ORDER — ACETAMINOPHEN 650 MG RE SUPP: 650.0000 mg | Freq: Four times a day (QID) | RECTAL | Status: DC | PRN

## 2022-11-23 MED ORDER — ONDANSETRON HCL 4 MG PO TABS: 4.0000 mg | ORAL_TABLET | Freq: Four times a day (QID) | ORAL | Status: DC | PRN

## 2022-11-23 MED ORDER — ACETAMINOPHEN 650 MG RE SUPP
650.0000 mg | Freq: Once | RECTAL | Status: AC
  Administered 2022-11-23: 650 mg via RECTAL
  Filled 2022-11-23: qty 1

## 2022-11-23 MED ORDER — SODIUM CHLORIDE 0.9 % IV SOLN
1.0000 g | INTRAVENOUS | Status: DC
  Filled 2022-11-23: qty 10

## 2022-11-23 MED ORDER — IOHEXOL 300 MG/ML  SOLN
100.0000 mL | Freq: Once | INTRAMUSCULAR | Status: AC | PRN
  Administered 2022-11-23: 80 mL via INTRAVENOUS

## 2022-11-23 MED ORDER — LEVALBUTEROL HCL 0.63 MG/3ML IN NEBU: 0.6300 mg | INHALATION_SOLUTION | Freq: Four times a day (QID) | RESPIRATORY_TRACT | Status: DC | PRN

## 2022-11-23 MED ORDER — APIXABAN 2.5 MG PO TABS
2.5000 mg | ORAL_TABLET | Freq: Two times a day (BID) | ORAL | Status: DC
  Administered 2022-11-23 – 2022-11-26 (×7): 2.5 mg via ORAL
  Filled 2022-11-23 (×7): qty 1

## 2022-11-23 MED ORDER — METRONIDAZOLE 500 MG/100ML IV SOLN
500.0000 mg | Freq: Once | INTRAVENOUS | Status: AC
  Administered 2022-11-23: 500 mg via INTRAVENOUS
  Filled 2022-11-23: qty 100

## 2022-11-23 MED ORDER — SODIUM CHLORIDE 0.9% FLUSH
3.0000 mL | Freq: Two times a day (BID) | INTRAVENOUS | Status: DC
  Administered 2022-11-23 – 2022-11-25 (×5): 3 mL via INTRAVENOUS

## 2022-11-23 MED ORDER — ADULT MULTIVITAMIN LIQUID CH
15.0000 mL | Freq: Every day | ORAL | Status: DC
  Administered 2022-11-24 – 2022-11-26 (×3): 15 mL via ORAL
  Filled 2022-11-23 (×6): qty 15

## 2022-11-23 MED ORDER — DILTIAZEM HCL 30 MG PO TABS
30.0000 mg | ORAL_TABLET | Freq: Two times a day (BID) | ORAL | Status: DC
  Administered 2022-11-23 – 2022-11-24 (×3): 30 mg via ORAL
  Filled 2022-11-23 (×3): qty 1

## 2022-11-23 MED ORDER — ADULT MULTIVITAMIN LIQUID CH
5.0000 mL | Freq: Every day | ORAL | Status: DC
  Filled 2022-11-23 (×4): qty 15

## 2022-11-23 MED ORDER — TRAZODONE HCL 50 MG PO TABS: 25.0000 mg | ORAL_TABLET | Freq: Every evening | ORAL | Status: DC | PRN

## 2022-11-23 MED ORDER — ACETAMINOPHEN 325 MG PO TABS: 650.0000 mg | ORAL_TABLET | Freq: Four times a day (QID) | ORAL | Status: DC | PRN

## 2022-11-23 MED ORDER — OXYCODONE HCL 5 MG PO TABS: 5.0000 mg | ORAL_TABLET | ORAL | Status: DC | PRN

## 2022-11-23 MED ORDER — SODIUM CHLORIDE 0.9 % IV SOLN
2.0000 g | Freq: Once | INTRAVENOUS | Status: AC
  Administered 2022-11-23: 2 g via INTRAVENOUS
  Filled 2022-11-23: qty 12.5

## 2022-11-23 NOTE — ED Notes (Signed)
Dr Roger Shelter at bedside.

## 2022-11-23 NOTE — ED Provider Notes (Addendum)
[facility] EMERGENCY DEPARTMENT Provider Note   CSN: [id number] Arrival date & time: 11/23/22  0557     History  Chief Complaint  Patient presents with   Altered Mental Status    [name] is a 89 y.o. female.  Patient sent to the ED by EMS from home.  EMS were called by family that the patient was recently diagnosed with urinary tract infection.  She has been on an oral antibiotic.  Family reported that the patient had been lethargic yesterday, had essentially been sitting in a chair since 4 PM until EMS arrived on the scene.  At arrival, patient moaning, does not answer questions.       Home Medications Prior to Admission medications   Medication Sig Start Date End Date Taking? Authorizing Provider  acetaminophen (TYLENOL) 325 MG tablet Take 2 tablets (650 mg total) by mouth every 6 (six) hours as needed for mild pain, fever or headache (or Fever >/= 101). 10/08/22   Roxan Hockey, MD  apixaban (ELIQUIS) 2.5 MG TABS tablet Take 1 tablet (2.5 mg total) by mouth 2 (two) times daily. 11/07/22   Satira Sark, MD  atenolol (TENORMIN) 100 MG tablet Take 1 tablet (100 mg total) by mouth 2 (two) times daily. 09/24/22   Roxan Hockey, MD  metFORMIN (GLUCOPHAGE-XR) 500 MG 24 hr tablet Take 2 tabs (1,000mg ) by mouth every morning & 1 tab (500mg ) every evening 03/24/13   [provider]  Multiple Vitamin (MULTIVITAMIN) LIQD Take 5 mLs by mouth daily.    [provider]  simvastatin (ZOCOR) 10 MG tablet Take 10 mg by mouth at bedtime.    [provider]      Allergies    Toprol xl [metoprolol succinate]    Review of Systems   Review of Systems  Physical Exam Updated Vital Signs BP (!) 65/45   Pulse (!) 160   Temp (!) 100.4 F (38 C) (Rectal)   Resp (!) 29   Ht 5' (1.524 m)   Wt 60.8 kg   SpO2 97%   BMI 26.17 kg/m  Physical Exam Vitals and nursing note reviewed.  Constitutional:      Appearance: She is well-developed.  HENT:      Head: Normocephalic and atraumatic.     Mouth/Throat:     Mouth: Mucous membranes are moist.  Eyes:     General: Vision grossly intact. Gaze aligned appropriately.     Extraocular Movements: Extraocular movements intact.     Conjunctiva/sclera: Conjunctivae normal.  Cardiovascular:     Rate and Rhythm: Tachycardia present. Rhythm irregular.     Pulses: Normal pulses.     Heart sounds: Normal heart sounds, S1 normal and S2 normal. No murmur heard.    No friction rub. No gallop.  Pulmonary:     Effort: Pulmonary effort is normal. No respiratory distress.     Breath sounds: Normal breath sounds.  Abdominal:     General: Bowel sounds are normal.     Palpations: Abdomen is soft.     Tenderness: There is no abdominal tenderness. There is no guarding or rebound.     Hernia: No hernia is present.  Musculoskeletal:        General: No swelling.     Cervical back: Full passive range of motion without pain, normal range of motion and neck supple. No spinous process tenderness or muscular tenderness. Normal range of motion.     Right lower leg: No edema.  Left lower leg: No edema.  Skin:    General: Skin is warm and dry.     Capillary Refill: Capillary refill takes less than 2 seconds.     Findings: No ecchymosis, erythema, rash or wound.  Neurological:     General: No focal deficit present.     Mental Status: She is lethargic.     GCS: GCS verbal subscore is 5.     Cranial Nerves: Cranial nerves 2-12 are intact.     Motor: Motor function is intact.     ED Results / Procedures / Treatments   Labs (all labs ordered are listed, but only abnormal results are displayed) Labs Reviewed  LACTIC ACID, PLASMA - Abnormal; Notable for the following components:      Result Value   Lactic Acid, Venous 5.2 (*)    All other components within normal limits  COMPREHENSIVE METABOLIC PANEL - Abnormal; Notable for the following components:   Sodium 131 (*)    CO2 19 (*)    Glucose, Bld 195 (*)     BUN 35 (*)    Creatinine, Ser 1.22 (*)    Albumin 3.3 (*)    AST 320 (*)    ALT 157 (*)    Total Bilirubin 1.5 (*)    GFR, Estimated 41 (*)    All other components within normal limits  CBC WITH DIFFERENTIAL/PLATELET - Abnormal; Notable for the following components:   WBC 14.0 (*)    Hemoglobin 11.6 (*)    RDW 16.0 (*)    Neutro Abs 13.5 (*)    Lymphs Abs 0.1 (*)    Abs Immature Granulocytes 0.09 (*)    All other components within normal limits  PROTIME-INR - Abnormal; Notable for the following components:   Prothrombin Time 15.3 (*)    All other components within normal limits  URINALYSIS, ROUTINE W REFLEX MICROSCOPIC - Abnormal; Notable for the following components:   Protein, ur 30 (*)    Leukocytes,Ua TRACE (*)    All other components within normal limits  CK - Abnormal; Notable for the following components:   Total CK 24 (*)    All other components within normal limits  RESP PANEL BY RT-PCR (RSV, FLU A&B, COVID)  RVPGX2  CULTURE, BLOOD (ROUTINE X 2)  CULTURE, BLOOD (ROUTINE X 2)  URINE CULTURE  APTT  LACTIC ACID, PLASMA    EKG EKG Interpretation  Date/Time:  Friday November 23 2022 06:17:56 EST Ventricular Rate:  152 PR Interval:    QRS Duration: 88 QT Interval:  298 QTC Calculation: 474 R Axis:   63 Text Interpretation: Atrial fibrillation with rapid V-rate Low voltage, extremity leads Repolarization abnormality, prob rate related Confirmed by Orpah Greek [phone]) on 11/23/2022 6:31:40 AM  Radiology DG Chest Port 1 View  Result Date: 11/23/2022 CLINICAL DATA:  Questionable sepsis EXAM: PORTABLE CHEST 1 VIEW COMPARISON:  10/03/2022 FINDINGS: Retrocardiac opacity. Low volume chest. No edema, effusion, or pneumothorax. Normal heart size and mediastinal contours. IMPRESSION: Retrocardiac opacity suspicious for pneumonia Electronically Signed   By: Jorje Guild M.D.   On: 11/23/2022 06:52    Procedures Procedures  Angiocath insertion Performed by:  Orpah Greek  Consent: Verbal consent obtained. Risks and benefits: risks, benefits and alternatives were discussed Time out: Immediately prior to procedure a "time out" was called to verify the correct patient, procedure, equipment, support staff and site/side marked as required.  Preparation: Patient was prepped and draped in the usual sterile fashion.  Vein  Location: R antecub  Ultrasound Guided  Gauge: 20G  Normal blood return and flush without difficulty Patient tolerance: Patient tolerated the procedure well with no immediate complications.    Medications Ordered in ED Medications  lactated ringers infusion (has no administration in time range)  lactated ringers bolus 1,000 mL (0 mLs Intravenous Paused 11/23/22 0729)  metroNIDAZOLE (FLAGYL) IVPB 500 mg (0 mg Intravenous Paused 11/23/22 0729)  vancomycin (VANCOREADY) IVPB 1250 mg/250 mL (has no administration in time range)  ceFEPIme (MAXIPIME) 2 g in sodium chloride 0.9 % 100 mL IVPB (0 g Intravenous Stopped 11/23/22 0723)  acetaminophen (TYLENOL) suppository 650 mg (650 mg Rectal Given 11/23/22 0704)  lactated ringers bolus 1,000 mL (1,000 mLs Intravenous New Bag/Given 11/23/22 4270)    ED Course/ Medical Decision Making/ A&P                           Medical Decision Making Amount and/or Complexity of Data Reviewed Independent Historian: EMS External Data Reviewed: labs, radiology, ECG and notes. Labs: ordered. Decision-making details documented in ED Course. Radiology: ordered. ECG/medicine tests: ordered and independent interpretation performed. Decision-making details documented in ED Course.  Risk OTC drugs. Prescription drug management.   Patient presents to the emergency department for evaluation of altered mental status.  Patient reportedly diagnosed with urinary tract infection within the last week, is on oral antibiotics.  She has become more lethargic since yesterday.  At arrival she is moaning and does  not answer any questions.  She is protecting her airway.  She is found to be febrile at arrival.  Patient tachycardic, tachypneic and febrile, sepsis treatment for unknown source initiated.  Patient in atrial fibrillation.  Reviewing her records reveals that this is chronic for her.  Will treat fever and administer fluids, first lactic acid is pending.  She is not significantly hypotensive at this point.  Will watch closely.  Patient to be signed out to oncoming ER physician to follow results.  Addendum: Patient with leukocytosis, lactic acidosis.  Chest x-ray suspicious for pneumonia.  Broad-spectrum antibiotics already provided should cover.  She is, however, still tachycardic and blood pressure is now dropped to 82/53.  Additional IV fluids ordered to complete 30 mL per kilogram bolus.      CRITICAL CARE Performed by: Orpah Greek   Total critical care time: 30 minutes  Critical care time was exclusive of separately billable procedures and treating other patients.  Critical care was necessary to treat or prevent imminent or life-threatening deterioration.  Critical care was time spent personally by me on the following activities: development of treatment plan with patient and/or surrogate as well as nursing, discussions with consultants, evaluation of patient's response to treatment, examination of patient, obtaining history from patient or surrogate, ordering and performing treatments and interventions, ordering and review of laboratory studies, ordering and review of radiographic studies, pulse oximetry and re-evaluation of patient's condition.         Final Clinical Impression(s) / ED Diagnoses Final diagnoses:  Sepsis with encephalopathy without septic shock, due to unspecified organism [facility])    Rx / DC Orders ED Discharge Orders     None         Jacorey Donaway, Gwenyth Allegra, MD 11/23/22 6237    Orpah Greek, MD 11/23/22 6283    Orpah Greek, MD 11/23/22 [phone]

## 2022-11-23 NOTE — Assessment & Plan Note (Addendum)
-   Tachyarrhythmia A-fib overnight -Resuming home medication of atenolol -Briefly was started on amiodarone drip-will be discontinued -Started on Cardizem 30 mg twice a day daily--will be continued   - Continue home medication of apixaban -Monitoring closely

## 2022-11-23 NOTE — Assessment & Plan Note (Signed)
-  Follow-up urine cultures continue with current antibiotics cefepime -

## 2022-11-23 NOTE — Progress Notes (Signed)
Elink is following sepsis bundle. 

## 2022-11-23 NOTE — Assessment & Plan Note (Signed)
-  Holding her home medication of metformin -Checking CBG q. ACHS, coverage

## 2022-11-23 NOTE — Assessment & Plan Note (Signed)
-  Much improved mentation-mentation back to baseline - Likely due to sepsis  -Treating underlying causes

## 2022-11-23 NOTE — Assessment & Plan Note (Addendum)
-   Recent diagnosis of UTI, failed outpatient treatment -UA once again indicates possible UTI, will follow-up with cultures -Recommend laxative cefepime

## 2022-11-23 NOTE — ED Notes (Signed)
Pt has been changed and cleaned up due to BM. Pt not placed on a purewick  due to being raw and seem to be very painful to the touch for pt. Pt has a new brief placed. RN and MD made aware

## 2022-11-23 NOTE — ED Notes (Signed)
Pt transported to CT ?

## 2022-11-23 NOTE — Evaluation (Signed)
Physical Therapy Evaluation Patient Details Name: [name] MRN: [id number] DOB: [birth year] Today's Date: 11/23/2022  History of Present Illness  Per Dr Quintin Alto whem admitted on 1/5: "[name] is a 72 old female presenting from home chief complaint of being lethargic since yesterday.  Past medical history of P-A-fib (on apixaban),  HTN, HLD,, hyperlipidemia, DM II, ?  Systolic CHF. ..   Patient was recently diagnosed with urine tract infection has been on oral antibiotics.  Family reports patient has becoming more weak, lethargic-sitting in a chair since 4 PM yesterday with poor p.o. intake minimally interactive. "  Clinical Impression  PT has decreased activity tolerance with destating and noted A fib with sitting task.  Pt does not desire to go to a SNF but daughter is present and is not able to care for pt in this state.  If pt independence in self mobilization improves pt daughter desire is to take her mother home.  At this time therapist recommends SNF        Recommendations for follow up therapy are one component of a multi-disciplinary discharge planning process, led by the attending physician.  Recommendations may be updated based on patient status, additional functional criteria and insurance authorization.  Follow Up Recommendations Skilled nursing-short term rehab (<3 hours/day) Can patient physically be transported by private vehicle: No    Assistance Recommended at Discharge    Patient can return home with the following  A lot of help with walking and/or transfers;A lot of help with bathing/dressing/bathroom;Assistance with cooking/housework;Direct supervision/assist for medications management    Equipment Recommendations None recommended by PT  Recommendations for Other Services       Functional Status Assessment Patient has had a recent decline in their functional status and demonstrates the ability to make significant improvements in function in a reasonable and  predictable amount of time.     Precautions / Restrictions Precautions Precautions: Fall Restrictions Weight Bearing Restrictions: No      Mobility  Bed Mobility Overal bed mobility: Needs Assistance Bed Mobility: Rolling, Sidelying to Sit, Sit to Sidelying Rolling: Min assist Sidelying to sit: Mod assist     Sit to sidelying: Max assist      Transfers                   General transfer comment: pt destat and increased Afib therefore was not transferred to chair           Balance Overall balance assessment: Needs assistance Sitting-balance support: Bilateral upper extremity supported Sitting balance-Leahy Scale: Poor   Postural control: Posterior lean, Left lateral lean Standing balance support: Bilateral upper extremity supported                                 Pertinent Vitals/Pain Pain Assessment Facial Expression: Grimacing (coming from supine to sit)    Home Living Family/patient expects to be discharged to:: Skilled nursing facility Living Arrangements: Children                 Additional Comments: information from prior admission    Prior Function Prior Level of Function : Needs assist       Physical Assist : Mobility (physical);ADLs (physical) Mobility (physical): Bed mobility;Transfers;Gait;Stairs   Mobility Comments: household ambulator using RW ADLs Comments: assisted by family     Hand Dominance   Dominant Hand: Right    Extremity/Trunk Assessment  Lower Extremity Assessment Lower Extremity Assessment: Generalized weakness       Communication   Communication: [facility]        Exercises General Exercises - Lower Extremity Ankle Circles/Pumps: AAROM, Both, 10 reps Heel Slides: Both, 10 reps Hip ABduction/ADduction: Both, 10 reps   Assessment/Plan    PT Assessment Patient needs continued PT services  PT Problem List Decreased strength;Decreased activity tolerance;Decreased balance;Decreased  mobility;Pain       PT Treatment Interventions Functional mobility training;Therapeutic activities;Therapeutic exercise;Gait training    PT Goals (Current goals can be found in the Care Plan section)  Acute Rehab PT Goals PT Goal Formulation: With patient/family Time For Goal Achievement: 11/30/22 Potential to Achieve Goals: Fair    Frequency Min 3X/week        AM-PAC PT "6 Clicks" Mobility  Outcome Measure Help needed turning from your back to your side while in a flat bed without using bedrails?: A Little Help needed moving from lying on your back to sitting on the side of a flat bed without using bedrails?: A Lot Help needed moving to and from a bed to a chair (including a wheelchair)?: A Lot Help needed standing up from a chair using your arms (e.g., wheelchair or bedside chair)?: A Lot Help needed to walk in hospital room?: A Lot Help needed climbing 3-5 steps with a railing? : Total 6 Click Score: 12    End of Session Equipment Utilized During Treatment: Gait belt Activity Tolerance: Patient limited by lethargy;Patient limited by pain Patient left: in bed;with call bell/phone within reach;with family/visitor present Nurse Communication: Mobility status PT Visit Diagnosis: Muscle weakness (generalized) (M62.81)    Time: 1100-1136 PT Time Calculation (min) (ACUTE ONLY): 36 min   Charges:   PT Evaluation $PT Eval Moderate Complexity: [place], PT CLT [phone]  11/23/2022, 11:37 AM

## 2022-11-23 NOTE — ED Notes (Signed)
Plan of care discussed with patients family. Dr Roger Shelter wants to continue with 3rd LR bolus of 1000 ml. Discussion about pressors with family, no new orders received, Per Shahmehdi, bolus 3rd LR 1000 ml and hold on pressors at this time.

## 2022-11-23 NOTE — Plan of Care (Signed)
  Problem: Acute Rehab PT Goals(only PT should resolve) Goal: Pt Will Go Supine/Side To Sit Flowsheets (Taken 11/23/2022 1135) Pt will go Supine/Side to Sit: with minimal assist Goal: Pt Will Go Sit To Supine/Side Flowsheets (Taken 11/23/2022 1135) Pt will go Sit to Supine/Side: with minimal assist Goal: Patient Will Transfer Sit To/From Stand Flowsheets (Taken 11/23/2022 1135) Patient will transfer sit to/from stand: with moderate assist Goal: Pt Will Ambulate Flowsheets (Taken 11/23/2022 1135) Pt will Ambulate:  15 feet  with rolling walker  with moderate assist

## 2022-11-23 NOTE — Hospital Course (Signed)
[name] is a 27 old female presenting from home chief complaint of being lethargic since yesterday.  Past medical history of P-A-fib (on apixaban),  HTN, HLD,, hyperlipidemia, DM II, ?  Systolic CHF. ..  Patient was recently diagnosed with urine tract infection has been on oral antibiotics.  Family reports patient has becoming more weak, lethargic-sitting in a chair since 4 PM yesterday with poor p.o. intake minimally interactive.   ED course: Upon arrival patient was found lethargic with G CS verbal subscore 5 Blood pressure (!) 77/60, pulse (!) 124, temperature (!) 100.4 F (38 C), temperature source Rectal, resp. rate (!) 31, height 5' (1.524 m), weight 60.8 kg, SpO2 98 %. P as low as 65/45 Lactic acid 5.2, WBC of 14, creatinine 1.22, glucose of 195 UA: Trace of leukocyte esterase, negative for nitrites, UA WBC 21-50, Chest x-ray:Retrocardiac opacity suspicious for pneumonia   Patient was diagnosed with sepsis,-sepsis pathway was activated, initiated with IV fluid resuscitation, broad-spectrum antibiotics cefepime Blood and urine cultures were obtained Stop the source of sepsis is more urinary to pneumonia  Requested patient to be admitted for sepsis

## 2022-11-23 NOTE — Assessment & Plan Note (Signed)
-  Fall precautions PT OT -Underlying cause

## 2022-11-23 NOTE — Assessment & Plan Note (Addendum)
.  Body mass index is 29.28 kg/m. -Continue nutritional intake Once stable will consult nutrition

## 2022-11-23 NOTE — ED Notes (Signed)
Informed Dr Nila Nephew that pt has had 2L of LR bolused already with 2 additional liters of LR ordered. Pt still hypotensive. Per Dr Roger Shelter start additional liter of LR.

## 2022-11-23 NOTE — Assessment & Plan Note (Signed)
-   Stable history of CKD stage IIIa -Trend BUN/creatinine Lab Results  Component Value Date   CREATININE 1.22 (H) 11/23/2022   CREATININE 0.83 10/08/2022   CREATININE 0.80 10/06/2022   -Doing nephrotoxins

## 2022-11-23 NOTE — ED Notes (Signed)
Attempting second IV, pt difficult stick.

## 2022-11-23 NOTE — Progress Notes (Signed)
Pharmacy Antibiotic Note  [name] is a 89 y.o. female admitted on 11/23/2022 with  .altered mental status .  Pharmacy has been consulted for cefepime dosing dosing for possible urosepsis.  Patient recently on cephalexin and cipro for uti. 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  Patient with tmax of 100.4, wbc 11, lactic acid 5.2 currently receiving aggressive fluid resuscitation.   Plan: Cefepime 2g IV q24 hours - next dose 1/6 Follow up urine cx  Height: 5' (152.4 cm) Weight: 60.8 kg (134 lb) IBW/kg (Calculated) : 45.5  Temp (24hrs), Avg:100.4 F (38 C), Min:100.4 F (38 C), Max:100.4 F (38 C)  Recent Labs  Lab 11/23/22 0644  WBC 14.0*  CREATININE 1.22*  LATICACIDVEN 5.2*    Estimated Creatinine Clearance: 23.5 mL/min (A) (by C-G formula based on SCr of 1.22 mg/dL (H)).    Allergies  Allergen Reactions   Toprol Xl [Metoprolol Succinate] Other (See Comments)    HAIR LOSS    Thank you for allowing pharmacy to be a part of this patient's care.  Erin Hearing PharmD., BCPS Clinical Pharmacist 11/23/2022 8:48 AM

## 2022-11-23 NOTE — ED Triage Notes (Addendum)
Pt from home via [facility] after pt family called out for altered mental status. Upon arrival EMS states that family informed them that pt has been lethargic and sitting in chair since 4 pm yesterday without any movement. Pt was diagnosed with UTI last Friday and was given oral antibiotics to take. Temperature axillary was 99.0.

## 2022-11-23 NOTE — H&P (Signed)
History and Physical   Patient: [name]                            PCP: Manon Hilding, MD                    DOB: 08-Dec-1926            DOA: 11/23/2022 RUE:[id number]             DOS: 11/23/2022, 10:47 AM  Sasser, Silvestre Moment, MD  Patient coming from:   HOME  I have personally reviewed patient's medical records, in electronic medical records, including:  [place] link, and care everywhere.    Chief Complaint:   Chief Complaint  Patient presents with   Altered Mental Status    History of present illness:    [name] is a 28 old female presenting from home chief complaint of being lethargic since yesterday.  Past medical history of P-A-fib (on apixaban),  HTN, HLD,, hyperlipidemia, DM II, ?  Systolic CHF. ..  Patient was recently diagnosed with urine tract infection has been on oral antibiotics.  Family reports patient has becoming more weak, lethargic-sitting in a chair since 4 PM yesterday with poor p.o. intake minimally interactive.   ED course: Upon arrival patient was found lethargic with G CS verbal subscore 5 Blood pressure (!) 77/60, pulse (!) 124, temperature (!) 100.4 F (38 C), temperature source Rectal, resp. rate (!) 31, height 5' (1.524 m), weight 60.8 kg, SpO2 98 %. P as low as 65/45 Lactic acid 5.2, WBC of 14, creatinine 1.22, glucose of 195 UA: Trace of leukocyte esterase, negative for nitrites, UA WBC 21-50, Chest x-ray:Retrocardiac opacity suspicious for pneumonia   Patient was diagnosed with sepsis,-sepsis pathway was activated, initiated with IV fluid resuscitation, broad-spectrum antibiotics cefepime Blood and urine cultures were obtained Stop the source of sepsis is more urinary to pneumonia  Requested patient to be admitted for sepsis    Patient Denies having: Fever, Chills, Cough, SOB, Chest Pain, Abd pain, N/V/D, headache, dizziness, lightheadedness,  Dysuria, Joint pain, rash, open wounds  abs;   Review of Systems: As per HPI, otherwise 10  point review of systems were negative.   ----------------------------------------------------------------------------------------------------------------------  Allergies  Allergen Reactions   Toprol Xl [Metoprolol Succinate] Other (See Comments)    HAIR LOSS    Home MEDs:  Prior to Admission medications   Medication Sig Start Date End Date Taking? Authorizing Provider  acetaminophen (TYLENOL) 325 MG tablet Take 2 tablets (650 mg total) by mouth every 6 (six) hours as needed for mild pain, fever or headache (or Fever >/= 101). 10/08/22   Roxan Hockey, MD  apixaban (ELIQUIS) 2.5 MG TABS tablet Take 1 tablet (2.5 mg total) by mouth 2 (two) times daily. 11/07/22   Satira Sark, MD  atenolol (TENORMIN) 100 MG tablet Take 1 tablet (100 mg total) by mouth 2 (two) times daily. 09/24/22   Roxan Hockey, MD  metFORMIN (GLUCOPHAGE-XR) 500 MG 24 hr tablet Take 2 tabs (1,000mg ) by mouth every morning & 1 tab (500mg ) every evening 03/24/13   [provider]  Multiple Vitamin (MULTIVITAMIN) LIQD Take 5 mLs by mouth daily.    [provider]  simvastatin (ZOCOR) 10 MG tablet Take 10 mg by mouth at bedtime.    [provider]    PRN MEDs: sodium chloride, acetaminophen **OR** acetaminophen, bisacodyl, hydrALAZINE, HYDROmorphone (DILAUDID) injection, ipratropium, levalbuterol,  ondansetron **OR** ondansetron (ZOFRAN) IV, oxyCODONE, senna-docusate, sodium chloride flush, sodium phosphate, traZODone  Past Medical History:  Diagnosis Date   Atrial fibrillation ([place])    Coronary atherosclerosis of native coronary artery    Nonobstructive   Dyslipidemia    PSVT (paroxysmal supraventricular tachycardia)    TIA (transient ischemic attack)    Type 2 diabetes mellitus ([place])    Diet controlled    Past Surgical History:  Procedure Laterality Date   ABDOMINAL HYSTERECTOMY     CARPAL TUNNEL RELEASE     x's 2   CHOLECYSTECTOMY     REPLACEMENT TOTAL KNEE  2002 & 2012    SHOULDER SURGERY  2001     reports that she has never smoked. She has never used smokeless tobacco. She reports that she does not drink alcohol and does not use drugs.   Family History  Problem Relation Age of Onset   Coronary artery disease Mother     Physical Exam:   Vitals:   11/23/22 0830 11/23/22 0900 11/23/22 0915 11/23/22 1009  BP: (!) 77/60 (!) 84/45 (!) 89/58 (!) 106/52  Pulse: (!) 124 (!) 120 (!) 115   Resp: (!) 31 (!) 26 (!) 25 (!) 29  Temp:  (!) 97.5 F (36.4 C)  (!) 97.5 F (36.4 C)  TempSrc:  Oral  Oral  SpO2: 98% 97% 96%   Weight:      Height:       Constitutional: Lethargic otherwise, comfortable, opens her eyes to pain stimuli, able to verbalize her name Eyes: PERRL, lids and conjunctivae normal ENMT: Mucous membranes are moist. Posterior pharynx clear of any exudate or lesions.Normal dentition.  Neck: normal, supple, no masses, no thyromegaly Respiratory: clear to auscultation bilaterally, no wheezing, no crackles. Normal respiratory effort. No accessory muscle use.  Cardiovascular: Regular rate and rhythm, no murmurs / rubs / gallops. No extremity edema. 2+ pedal pulses. No carotid bruits.  Abdomen: no tenderness, no masses palpated. No hepatosplenomegaly. Bowel sounds positive.  Musculoskeletal: Severe generalized weaknesses,  no clubbing / cyanosis. No joint deformity upper and lower extremities. Good ROM, no contractures. Normal muscle tone.  Neurologic: CN II-XII grossly intact. Sensation intact, DTR normal. Strength 5/5 in all 4.  Psychiatric: Normal judgment and insight. Alert and oriented x 3. Normal mood.  Skin: no rashes, lesions, ulcers. No induration Decubitus/ulcers:  Wounds: per nursing documentation         Labs on admission:    I have personally reviewed following labs and imaging studies  CBC: Recent Labs  Lab 11/23/22 0644  WBC 14.0*  NEUTROABS 13.5*  HGB 11.6*  HCT 36.2  MCV 92.8  PLT 638   Basic Metabolic  Panel: Recent Labs  Lab 11/23/22 0644 11/23/22 0837  NA 131*  --   K 4.4  --   CL 98  --   CO2 19*  --   GLUCOSE 195*  --   BUN 35*  --   CREATININE 1.22*  --   CALCIUM 8.9  --   MG  --  1.0*  PHOS  --  3.6   GFR: Estimated Creatinine Clearance: 23.5 mL/min (A) (by C-G formula based on SCr of 1.22 mg/dL (H)). Liver Function Tests: Recent Labs  Lab 11/23/22 0644  AST 320*  ALT 157*  ALKPHOS 54  BILITOT 1.5*  PROT 6.9  ALBUMIN 3.3*   No results for input(s): "LIPASE", "AMYLASE" in the last 168 hours. No results for input(s): "AMMONIA" in the last 168 hours. Coagulation  Profile: Recent Labs  Lab 11/23/22 0644  INR 1.2   Cardiac Enzymes: Recent Labs  Lab 11/23/22 0644  CKTOTAL 24*    Urine analysis:    Component Value Date/Time   COLORURINE YELLOW 11/23/2022 0626   APPEARANCEUR CLEAR 11/23/2022 0626   LABSPEC 1.010 11/23/2022 0626   PHURINE 6.0 11/23/2022 0626   GLUCOSEU NEGATIVE 11/23/2022 0626   HGBUR NEGATIVE 11/23/2022 0626   BILIRUBINUR NEGATIVE 11/23/2022 0626   KETONESUR NEGATIVE 11/23/2022 0626   PROTEINUR 30 (A) 11/23/2022 0626   NITRITE NEGATIVE 11/23/2022 0626   LEUKOCYTESUR TRACE (A) 11/23/2022 0626    Last A1C:  Lab Results  Component Value Date   HGBA1C 6.2 (H) 07/06/2022     Radiologic Exams on Admission:   CT Abdomen Pelvis W Contrast  Result Date: 11/23/2022 CLINICAL DATA:  Abdominal pain, sepsis. EXAM: CT ABDOMEN AND PELVIS WITH CONTRAST TECHNIQUE: Multidetector CT imaging of the abdomen and pelvis was performed using the standard protocol following bolus administration of intravenous contrast. RADIATION DOSE REDUCTION: This exam was performed according to the departmental dose-optimization program which includes automated exposure control, adjustment of the mA and/or kV according to patient size and/or use of iterative reconstruction technique. CONTRAST:  55mL OMNIPAQUE IOHEXOL 300 MG/ML  SOLN COMPARISON:  08/18/2022. FINDINGS:  Lower chest: Mild dependent atelectasis in the lower lobes. Heart is enlarged. Atherosclerotic calcification of the aorta, aortic valve and coronary arteries. Enlarged pulmonic trunk. No pericardial or pleural effusion. Distal esophagus is grossly unremarkable. Hepatobiliary: Low-attenuation lesions in the liver measure up to 1.7 cm in the left hepatic lobe, similar but too small to characterize. Liver is otherwise unremarkable. Cholecystectomy. No unexpected biliary ductal dilatation. Pancreas: Negative. Spleen: Negative. Adrenals/Urinary Tract: Bilateral adrenal nodularity, unchanged. No specific follow-up necessary. Right kidney is unremarkable. Subcentimeter low-attenuation lesion in the left kidney, too small to characterize. No specific follow-up necessary. Ureters are mildly dilated, left greater than right. Diffuse bladder wall thickening with perivesical inflammatory haziness. Stomach/Bowel: Tiny hiatal hernia. Duodenal diverticula. Stomach, small bowel, appendix and colon are otherwise unremarkable. Vascular/Lymphatic: Atherosclerotic calcification of the aorta. No pathologically enlarged lymph nodes. Reproductive: Hysterectomy. Simple appearing 1.9 cm left ovarian cyst. No follow-up imaging recommended. Note: This recommendation does not apply to premenarchal patients and to those with increased risk (genetic, family history, elevated tumor markers or other high-risk factors) of ovarian cancer. Reference: JACR 2020 Feb; 17(2):248-254. Other: No free air or free fluid. Mesenteries and peritoneum are unremarkable. Musculoskeletal: Degenerative changes in the spine. Levoconvex scoliosis. Minimal retrolisthesis of L2 on L3 with minimal grade 1 anterolisthesis L4 on L5, all likely degenerative in etiology. IMPRESSION: 1. Mild bilateral ureteral dilatation with marked bladder wall thickening and perivesical inflammatory haziness, findings suggesting cystitis. 2. Aortic atherosclerosis (ICD10-I70.0). Coronary  artery calcification. 3. Enlarged pulmonic trunk, indicative of pulmonary arterial hypertension. Electronically Signed   By: Lorin Picket M.D.   On: 11/23/2022 10:02   DG Chest Port 1 View  Result Date: 11/23/2022 CLINICAL DATA:  Questionable sepsis EXAM: PORTABLE CHEST 1 VIEW COMPARISON:  10/03/2022 FINDINGS: Retrocardiac opacity. Low volume chest. No edema, effusion, or pneumothorax. Normal heart size and mediastinal contours. IMPRESSION: Retrocardiac opacity suspicious for pneumonia Electronically Signed   By: Jorje Guild M.D.   On: 11/23/2022 06:52    EKG:   Independently reviewed.  Orders placed or performed during the hospital encounter of 11/23/22   EKG 12-Lead   EKG 12-Lead   ED EKG 12-Lead   ED EKG 12-Lead   EKG 12-Lead  EKG   ---------------------------------------------------------------------------------------------------------------------------------------    Assessment / Plan:   Principal Problem:   Sepsis ([place]) Active Problems:   Acute metabolic encephalopathy   Sepsis secondary to UTI ([place])   Acute cystitis without hematuria   Chronic diastolic CHF (congestive heart failure) (HCC)   Paroxysmal atrial fibrillation (HCC)   Dyslipidemia   Type 2 diabetes mellitus (HCC)   Essential hypertension, benign   Anemia of chronic disease   Body mass index 29.0-29.9, adult   Chronic kidney disease, stage III (moderate) (HCC)   Generalized weakness   Assessment and Plan: * Sepsis ([place]) -Met sepsis criteria on admission:  -DGL:OVFIEP acid 5.2, WBC 14.0, Tmax 100.4, HR 124, RR 31, BP 65/45, Cr. 1.22 -UA: Leukocytosis, WBC 21-50, -Chest x-ray opacity, cannot rule out pneumonia  -At this point patient met sepsis criteria-sepsis pathway initiated, with aggressive IV fluid resuscitation, broad-spectrum antibiotics cefepime  -Patient will be admitted to stepdown unit-continue with aggressive IV fluid resuscitation, current IV antibiotics with  cefepime  -Monitoring BP closely -Will follow-up with cultures of blood and urine   Sepsis secondary to UTI ([place]) -Follow-up urine cultures continue with current antibiotics cefepime -  Acute metabolic encephalopathy - Likely due to sepsis -Neurochecks, monitoring mentation closely -Treating underlying causes  Chronic diastolic CHF (congestive heart failure) ([place]) -Medical record indicates history of diastolic CHF -[facility]18/9517 at [facility], EJ EF 60-65% negative valvular abnormalities, normal systolic function -Unfortunately patient needs aggressive IV fluid resuscitation due to hypotension and sepsis -Monitoring closely I's and O's, daily weight -Once patient stable, if develops symptoms, will initiate diuretics  Acute cystitis without hematuria - Recent diagnosis of UTI, failed outpatient treatment -UA once again indicates possible UTI, will follow-up with cultures -Recommend laxative cefepime  Paroxysmal atrial fibrillation ([place]) - Continue home medication of apixaban -Tachycardic due to sepsis -Monitoring closely  Dyslipidemia - Withholding home medication simvastatin -Assess, the benefit versus risk of statins will be analyzed at her age  Type 2 diabetes mellitus ([place]) -Holding her home medication of metformin -Checking CBG q. ACHS, coverage  Generalized weakness -Fall precautions PT OT -Underlying cause  Chronic kidney disease, stage III (moderate) (HCC) - Stable history of CKD stage IIIa -Trend BUN/creatinine Lab Results  Component Value Date   CREATININE 1.22 (H) 11/23/2022   CREATININE 0.83 10/08/2022   CREATININE 0.80 10/06/2022   -Doing nephrotoxins  Body mass index 29.0-29.9, adult .Body mass index is 26.17 kg/m. -Continue nutritional intake Once stable will consult nutrition  Anemia of chronic disease -Monitoring hemoglobin closely -stable    Latest Ref Rng & Units 11/23/2022    6:44 AM 10/08/2022    6:32 AM 10/06/2022    4:56 AM  CBC   WBC 4.0 - 10.5 K/uL 14.0  8.6  6.2   Hemoglobin 12.0 - 15.0 g/dL 11.6  9.2  9.2   Hematocrit 36.0 - 46.0 % 36.2  27.8  27.8   Platelets 150 - 400 K/uL 313  367  332      Essential hypertension, benign -  Hypotensive due to sepsis-holding home BP meds (atenolol)       Consults called:  None -------------------------------------------------------------------------------------------------------------------------------------------- DVT prophylaxis:  apixaban (ELIQUIS) tablet 2.5 mg Start: 11/23/22 1000 TED hose Start: 11/23/22 0837 SCDs Start: 11/23/22 0837 apixaban (ELIQUIS) tablet 2.5 mg   Code Status:   Code Status: Full Code   Admission status: Patient will be admitted as Inpatient, with a greater than 2 midnight length of stay. Level of care: ICU   Family Communication:  none at bedside  (The above findings and plan of care has been discussed with patient in detail, the patient expressed understanding and agreement of above plan)  --------------------------------------------------------------------------------------------------------------------------------------------------  Disposition Plan: >3 days Status is: Inpatient Remains inpatient appropriate because: Sepsis criteria, needing aggressive IV fluid resuscitation, antibiotics, maintaining blood pressure  ---------------------------------------------------------------------------------------------------------------------------------------  Time spent: > than  75  Min.  Critical time was spent in seeing evaluating patient, reviewing medical records, drawn plan of care, admitting patient to ICU, initiating admission for sepsis for ICU setting  SIGNED: Deatra James, MD, FHM. [facility],  Pager (Please use [email or web address] to page to text)  If 7PM-7AM, please contact night-coverage [email or web address],  11/23/2022, 10:47 AM

## 2022-11-23 NOTE — Assessment & Plan Note (Signed)
-  Met sepsis criteria on admission:  -SFS:ELTRVU acid 5.2, WBC 14.0, Tmax 100.4, HR 124, RR 31, BP 65/45, Cr. 1.22 -UA: Leukocytosis, WBC 21-50, -Chest x-ray opacity, cannot rule out pneumonia  -At this point patient met sepsis criteria-sepsis pathway initiated, with aggressive IV fluid resuscitation, broad-spectrum antibiotics cefepime  -Patient will be admitted to stepdown unit-continue with aggressive IV fluid resuscitation, current IV antibiotics with cefepime  -Monitoring BP closely -Will follow-up with cultures of blood and urine

## 2022-11-23 NOTE — Progress Notes (Signed)
[facility] IV team made this RN aware that patient would not be able to have PICC placed today and may not be until tomorrow or the day after. Stated that if it was urgent, the doctor would need to place a central line. Need is not urgent, but patient is a difficult stick and lab is having a hard time getting labs on patient. Patient only has one good working peripheral IV. Since [facility] IV team isn't able to place the PICC line today, this RN called vascular wellness. They will call back with ETA. Daughter at bedside, made aware.

## 2022-11-23 NOTE — ED Provider Notes (Signed)
Signed out to admit to hospitalist with sepsis, suspected urinary source, recent poor po intake/dehydration.   Bp improving with fluids ~ 1.5 liters in so far. Additional fluids ordered as bp still soft.  No new c/o. Pt is awake and alert appearing, responsive. Is breathing comfortably. Iv abx given.   Abd is diffusely tender - will add ct imaging to workup.   Pt with hx afib. Hr currently 115.  If bp improved, and remains tachy, may try cardizem iv.   Hospitalists consulted for admission.   'DNR' noted.     Lajean Saver, MD 11/23/22 [phone]

## 2022-11-23 NOTE — ED Notes (Signed)
Right chest IV infiltrated.

## 2022-11-23 NOTE — TOC Initial Note (Signed)
Transition of Care [facility]) - Initial/Assessment Note    Patient Details  Name: [name] MRN: [id number] Date of Birth: [birth year]  [facility] CM/SW Contact:    Shade Flood, LCSW Phone Number: 11/23/2022, 2:14 PM  Clinical Narrative:                  Pt admitted from home. Pt known to TOC from previous admissions. PT recommending SNF rehab at dc. Pt has been to [facility] a couple times for short term rehab in recent months. At baseline, pt was independent in ADLs and lived independently.  Pt currently only oriented to self. [name] sleeping at bedside and TOC did not awaken her. MD anticipating pt will remain hospitalized through the weekend.  TOC will reach out to pt's daughter about possible SNF rehab at dc when she is available.   Expected Discharge Plan: Skilled Nursing Facility Barriers to Discharge: Continued Medical Work up   Patient Goals and CMS Choice Patient states their goals for this hospitalization and ongoing recovery are:: get better          Expected Discharge Plan and Services In-house Referral: Clinical Social Work     Living arrangements for the past 2 months: Single Family Home                                      Prior Living Arrangements/Services Living arrangements for the past 2 months: Single Family Home Lives with:: Self Patient language and need for interpreter reviewed:: Yes Do you feel safe going back to the place where you live?: Yes      Need for Family Participation in Patient Care: Yes (Comment) Care giver support system in place?: Yes (comment) Current home services: DME, Homehealth aide, Home PT, Home RN Criminal Activity/Legal Involvement Pertinent to Current Situation/Hospitalization: No - Comment as needed  Activities of Daily Living Home Assistive Devices/Equipment: Walker (specify type), Hearing aid ADL Screening (condition at time of admission) Patient's cognitive ability adequate to safely  complete daily activities?: Yes Is the patient deaf or have difficulty hearing?: Yes Does the patient have difficulty seeing, even when wearing glasses/contacts?: No Does the patient have difficulty concentrating, remembering, or making decisions?: Yes Patient able to express need for assistance with ADLs?: Yes Does the patient have difficulty dressing or bathing?: Yes Independently performs ADLs?: No Communication: Independent Dressing (OT): Needs assistance Is this a change from baseline?: Pre-admission baseline Grooming: Needs assistance Is this a change from baseline?: Pre-admission baseline Feeding: Needs assistance Is this a change from baseline?: Pre-admission baseline Bathing: Needs assistance Is this a change from baseline?: Pre-admission baseline Toileting: Needs assistance Is this a change from baseline?: Pre-admission baseline In/Out Bed: Needs assistance Is this a change from baseline?: Pre-admission baseline Walks in Home: Needs assistance Is this a change from baseline?: Pre-admission baseline Does the patient have difficulty walking or climbing stairs?: Yes Weakness of Legs: None Weakness of Arms/Hands: None  Permission Sought/Granted                  Emotional Assessment       Orientation: : Oriented to Self Alcohol / Substance Use: Not Applicable Psych Involvement: No (comment)  Admission diagnosis:  Sepsis ([place]) [A41.9] Sepsis with encephalopathy without septic shock, due to unspecified organism ([place]) [A41.9, R65.20, G93.41] Patient Active Problem List   Diagnosis Date Noted   Sepsis ([place]) 11/23/2022  Fall at home, initial encounter 10/04/2022   Hypoalbuminemia due to protein-calorie malnutrition ([place]) 10/04/2022   Sepsis secondary to UTI ([place]) 10/03/2022   Chronic diastolic CHF (congestive heart failure) ([place]) 09/24/2022   COVID-19 virus infection 09/20/2022   Diverticulitis of colon    Pressure injury of skin 08/18/2022   Dyslipidemia     Type 2 diabetes mellitus (HCC)    Anemia of chronic disease 05/03/2022   B12 deficiency 05/03/2022   Paroxysmal atrial fibrillation ([place]) 52/06/222   Acute metabolic encephalopathy 36/10/2448   Acute cystitis without hematuria 12/01/2021   Current use of long term anticoagulation 08/15/2021   Generalized weakness 08/15/2021   Body mass index 29.0-29.9, adult 07/12/2017   Low back pain 07/12/2017   Proteinuria 11/21/2016   Chronic kidney disease, stage III (moderate) ([place]) 11/19/2016   Vascular insufficiency of intestine ([place]) 06/23/2014   Gouty arthropathy 06/11/2014   Osteoarthrosis 07/24/2013   Coronary atherosclerosis of native coronary artery 10/13/2012   Essential hypertension, benign 09/12/2011   PCP:  Manon Hilding, MD Pharmacy:   [id number] [place], [place] [address] [place] [address] Phone: [phone] Fax: [phone]  [place], [place] [address] [place] [address] Phone: [phone] Fax: [phone]     Social Determinants of Health (SDOH) Social History: SDOH Screenings   Food Insecurity: No Food Insecurity (11/23/2022)  Housing: Low Risk  (11/23/2022)  Transportation Needs: No Transportation Needs (11/23/2022)  Utilities: Not At Risk (11/23/2022)  Financial Resource Strain: Low Risk  (07/13/2022)  Tobacco Use: Low Risk  (11/23/2022)   SDOH Interventions:     Readmission Risk Interventions     No data to display

## 2022-11-23 NOTE — Assessment & Plan Note (Signed)
-  Monitoring hemoglobin closely -stable    Latest Ref Rng & Units 11/25/2022    4:07 AM 11/24/2022    3:36 AM 11/23/2022   10:32 AM  CBC  WBC 4.0 - 10.5 K/uL 5.9  7.9  11.5   Hemoglobin 12.0 - 15.0 g/dL 8.8  9.5  11.5   Hematocrit 36.0 - 46.0 % 27.2  29.6  36.6   Platelets 150 - 400 K/uL 223  251  268

## 2022-11-23 NOTE — Assessment & Plan Note (Signed)
Status post aggressive IV fluid resuscitation, no signs of volume overload, -Will discontinue all IV fluids  Hemodynamically stable -Medical record indicates history of diastolic CHF -1/0/3013 at [facility], EJ EF 60-65% negative valvular abnormalities, normal systolic function -Unfortunately patient needs aggressive IV fluid resuscitation due to hypotension and sepsis -Monitoring closely I's and O's, daily weight -Once patient stable, if develops symptoms, will initiate diuretics

## 2022-11-23 NOTE — Assessment & Plan Note (Signed)
-    Hypotensive due to sepsis-holding home BP meds (atenolol) -Hypotension has resolved, resuming home medication including atenolol

## 2022-11-23 NOTE — Progress Notes (Signed)
Elink following sepsis bundle. °

## 2022-11-23 NOTE — Assessment & Plan Note (Signed)
-   Withholding home medication simvastatin -Assess, the benefit versus risk of statins will be analyzed at her age

## 2022-11-24 DIAGNOSIS — A419 Sepsis, unspecified organism: Secondary | ICD-10-CM | POA: Diagnosis not present

## 2022-11-24 DIAGNOSIS — R6520 Severe sepsis without septic shock: Secondary | ICD-10-CM | POA: Diagnosis not present

## 2022-11-24 LAB — PROTIME-INR
INR: 1.5 — ABNORMAL HIGH (ref 0.8–1.2)
Prothrombin Time: 17.6 seconds — ABNORMAL HIGH (ref 11.4–15.2)

## 2022-11-24 LAB — CBC
HCT: 29.6 % — ABNORMAL LOW (ref 36.0–46.0)
Hemoglobin: 9.5 g/dL — ABNORMAL LOW (ref 12.0–15.0)
MCH: 29.5 pg (ref 26.0–34.0)
MCHC: 32.1 g/dL (ref 30.0–36.0)
MCV: 91.9 fL (ref 80.0–100.0)
Platelets: 251 K/uL (ref 150–400)
RBC: 3.22 MIL/uL — ABNORMAL LOW (ref 3.87–5.11)
RDW: 15.9 % — ABNORMAL HIGH (ref 11.5–15.5)
WBC: 7.9 K/uL (ref 4.0–10.5)
nRBC: 0 % (ref 0.0–0.2)

## 2022-11-24 LAB — BASIC METABOLIC PANEL
Anion gap: 9 (ref 5–15)
BUN: 36 mg/dL — ABNORMAL HIGH (ref 8–23)
CO2: 22 mmol/L (ref 22–32)
Calcium: 8.3 mg/dL — ABNORMAL LOW (ref 8.9–10.3)
Chloride: 102 mmol/L (ref 98–111)
Creatinine, Ser: 0.75 mg/dL (ref 0.44–1.00)
GFR, Estimated: >60 mL/min (ref >60)
Glucose, Bld: 131 mg/dL — ABNORMAL HIGH (ref 70–99)
Potassium: 3.8 mmol/L (ref 3.5–5.1)
Sodium: 133 mmol/L — ABNORMAL LOW (ref 135–145)

## 2022-11-24 LAB — URINE CULTURE: Culture: NO GROWTH

## 2022-11-24 LAB — GLUCOSE, CAPILLARY
Glucose-Capillary: 132 mg/dL — ABNORMAL HIGH (ref 70–99)
Glucose-Capillary: 168 mg/dL — ABNORMAL HIGH (ref 70–99)
Glucose-Capillary: 153 mg/dL — ABNORMAL HIGH (ref 70–99)
Glucose-Capillary: 176 mg/dL — ABNORMAL HIGH (ref 70–99)

## 2022-11-24 LAB — LACTIC ACID, PLASMA: Lactic Acid, Venous: 3.1 mmol/L (ref 0.5–1.9)

## 2022-11-24 LAB — HEMOGLOBIN A1C
Hgb A1c MFr Bld: 6.7 % — ABNORMAL HIGH (ref 4.8–5.6)
Mean Plasma Glucose: 146 mg/dL

## 2022-11-24 LAB — APTT: aPTT: 41 seconds — ABNORMAL HIGH (ref 24–36)

## 2022-11-24 MED ORDER — AMIODARONE HCL IN DEXTROSE 360-4.14 MG/200ML-% IV SOLN: 30.0000 mg/h | INTRAVENOUS | Status: DC

## 2022-11-24 MED ORDER — ALTEPLASE 2 MG IJ SOLR
2.0000 mg | Freq: Once | INTRAMUSCULAR | Status: DC
  Filled 2022-11-24: qty 2

## 2022-11-24 MED ORDER — LACTATED RINGERS IV BOLUS
1000.0000 mL | Freq: Once | INTRAVENOUS | Status: AC
  Administered 2022-11-24: 1000 mL via INTRAVENOUS

## 2022-11-24 MED ORDER — AMIODARONE HCL IN DEXTROSE 360-4.14 MG/200ML-% IV SOLN
60.0000 mg/h | INTRAVENOUS | Status: DC
  Administered 2022-11-24: 60 mg/h via INTRAVENOUS
  Filled 2022-11-24: qty 200

## 2022-11-24 MED ORDER — AMIODARONE LOAD VIA INFUSION
150.0000 mg | Freq: Once | INTRAVENOUS | Status: AC
  Administered 2022-11-24: 150 mg via INTRAVENOUS

## 2022-11-24 MED ORDER — DILTIAZEM HCL 25 MG/5ML IV SOLN: 10.0000 mg | Freq: Once | INTRAVENOUS | Status: AC

## 2022-11-24 MED ORDER — AMIODARONE IV BOLUS ONLY 150 MG/100ML
INTRAVENOUS | Status: AC
  Filled 2022-11-24: qty 100

## 2022-11-24 MED ORDER — DILTIAZEM HCL 25 MG/5ML IV SOLN
INTRAVENOUS | Status: AC
  Administered 2022-11-24: 10 mg via INTRAVENOUS
  Filled 2022-11-24: qty 5

## 2022-11-24 MED ORDER — LACTATED RINGERS IV SOLN: INTRAVENOUS | Status: DC

## 2022-11-24 MED ORDER — AMIODARONE HCL IN DEXTROSE 360-4.14 MG/200ML-% IV SOLN
INTRAVENOUS | Status: AC
  Administered 2022-11-24: 60 mg/h via INTRAVENOUS
  Filled 2022-11-24: qty 200

## 2022-11-24 NOTE — Progress Notes (Signed)
PICC consult: During chart review, noticed PICC was placed on 11/23/22. Confirmed with Purcell Nails, RN IV Team was no longer needed. Cancel PICC Team consult.

## 2022-11-24 NOTE — TOC Initial Note (Signed)
Transition of Care [facility]) - Initial/Assessment Note    Patient Details  Name: [name] MRN: [id number] Date of Birth: 17-Sep-1927  [facility] CM/SW Contact:    Boneta Lucks, RN Phone Number: 11/24/2022, 10:53 AM  Clinical Narrative:    Patient admitted with Sepsis. CM spoke with her daughter to discuss PT eval. Patient was in the unit and weak during time of evaluation. MD plans to move patient to 300 and discharge plan for Monday. Daughter states they stay with her 24/7 and as long as she can walk with her walker on day of discharge they will take her home with home health. She states she can not make that decision until she is better, patient is improving. TOC to follow. DC planning for possibly Monday.                Expected Discharge Plan: [place] Barriers to Discharge: Continued Medical Work up   Patient Goals and CMS Choice Patient states their goals for this hospitalization and ongoing recovery are:: get better      Expected Discharge Plan and Services In-house Referral: Clinical Social Work     Living arrangements for the past 2 months: Single Family Home                    Prior Living Arrangements/Services Living arrangements for the past 2 months: Single Family Home Lives with:: Adult Children Patient language and need for interpreter reviewed:: Yes Do you feel safe going back to the place where you live?: Yes      Need for Family Participation in Patient Care: Yes (Comment) Care giver support system in place?: Yes (comment) Current home services: DME Criminal Activity/Legal Involvement Pertinent to Current Situation/Hospitalization: No - Comment as needed  Activities of Daily Living Home Assistive Devices/Equipment: Walker (specify type), Hearing aid ADL Screening (condition at time of admission) Patient's cognitive ability adequate to safely complete daily activities?: Yes Is the patient deaf or have difficulty hearing?:  Yes Does the patient have difficulty seeing, even when wearing glasses/contacts?: No Does the patient have difficulty concentrating, remembering, or making decisions?: Yes Patient able to express need for assistance with ADLs?: Yes Does the patient have difficulty dressing or bathing?: Yes Independently performs ADLs?: No Communication: Independent Dressing (OT): Needs assistance Is this a change from baseline?: Pre-admission baseline Grooming: Needs assistance Is this a change from baseline?: Pre-admission baseline Feeding: Needs assistance Is this a change from baseline?: Pre-admission baseline Bathing: Needs assistance Is this a change from baseline?: Pre-admission baseline Toileting: Needs assistance Is this a change from baseline?: Pre-admission baseline In/Out Bed: Needs assistance Is this a change from baseline?: Pre-admission baseline Walks in Home: Needs assistance Is this a change from baseline?: Pre-admission baseline Does the patient have difficulty walking or climbing stairs?: Yes Weakness of Legs: None Weakness of Arms/Hands: None  Permission Sought/Granted      Permission granted to share info w Relationship: Daughter     Emotional Assessment      Orientation: : Oriented to Self Alcohol / Substance Use: Not Applicable Psych Involvement: No (comment)  Admission diagnosis:  Sepsis ([place]) [A41.9] Sepsis with encephalopathy without septic shock, due to unspecified organism ([place]) [A41.9, R65.20, G93.41] Patient Active Problem List   Diagnosis Date Noted   Sepsis ([place]) 11/23/2022   Fall at home, initial encounter 10/04/2022   Hypoalbuminemia due to protein-calorie malnutrition ([place]) 10/04/2022   Sepsis secondary to UTI ([place]) 10/03/2022   Chronic  diastolic CHF (congestive heart failure) ([place]) 09/24/2022   COVID-19 virus infection 09/20/2022   Diverticulitis of colon    Pressure injury of skin 08/18/2022   Dyslipidemia    Type 2 diabetes mellitus (HCC)     Anemia of chronic disease 05/03/2022   B12 deficiency 05/03/2022   Paroxysmal atrial fibrillation ([place]) 73/71/0626   Acute metabolic encephalopathy 94/85/4627   Acute cystitis without hematuria 12/01/2021   Current use of long term anticoagulation 08/15/2021   Generalized weakness 08/15/2021   Body mass index 29.0-29.9, adult 07/12/2017   Low back pain 07/12/2017   Proteinuria 11/21/2016   Chronic kidney disease, stage III (moderate) ([place]) 11/19/2016   Vascular insufficiency of intestine ([place]) 06/23/2014   Gouty arthropathy 06/11/2014   Osteoarthrosis 07/24/2013   Coronary atherosclerosis of native coronary artery 10/13/2012   Essential hypertension, benign 09/12/2011   PCP:  Manon Hilding, MD Pharmacy:   [id number] - [place], [place] [address] [place] [address] Phone: [phone] Fax: [phone]  [place], [place] [address] [place] [address] [place] [address] Phone: [phone] Fax: [phone]    Social Determinants of Health (SDOH) Social History: [place]: No Food Insecurity (11/23/2022)  Housing: Low Risk  (11/23/2022)  Transportation Needs: No Transportation Needs (11/23/2022)  Utilities: Not At Risk (11/23/2022)  Financial Resource Strain: Low Risk  (07/13/2022)  Tobacco Use: Low Risk  (11/23/2022)    Readmission Risk Interventions    11/24/2022   10:52 AM  Readmission Risk Prevention Plan  Transportation Screening Complete  Medication Review ([place]) Complete  PCP or Specialist appointment within 3-5 days of discharge Not Complete  HRI or Echo Complete  SW Recovery Care/Counseling Consult Complete  Palliative Care Screening Not Applicable  [place] Not Complete

## 2022-11-24 NOTE — Progress Notes (Signed)
PROGRESS NOTE    Patient: [name]                            PCP: Manon Hilding, MD                    DOB: 1927-05-19            DOA: 11/23/2022 FXT:[id number]             DOS: 11/24/2022, 10:33 AM   LOS: 1 day   Date of Service: The patient was seen and examined on 11/24/2022  Subjective:   The patient was seen and examined this morning. Hemodynamically stable, with exception of tachycardia, A-fib overnight with briefly started on amiodarone drip, and p.o. Cardizem Much improved BP currently satting 98% on room air  Brief Narrative:   [name] is a 89 old female presenting from home chief complaint of being lethargic since yesterday.  Past medical history of P-A-fib (on apixaban),  HTN, HLD,, hyperlipidemia, DM II, ?  Systolic CHF. ..  Patient was recently diagnosed with urine tract infection has been on oral antibiotics.  Family reports patient has becoming more weak, lethargic-sitting in a chair since 4 PM yesterday with poor p.o. intake minimally interactive.   ED course: Upon arrival patient was found lethargic with G CS verbal subscore 5 Blood pressure (!) 77/60, pulse (!) 124, temperature (!) 100.4 F (38 C), temperature source Rectal, resp. rate (!) 31, height 5' (1.524 m), weight 60.8 kg, SpO2 98 %. P as low as 65/45 Lactic acid 5.2, WBC of 14, creatinine 1.22, glucose of 195 UA: Trace of leukocyte esterase, negative for nitrites, UA WBC 21-50, Chest x-ray:Retrocardiac opacity suspicious for pneumonia   Patient was diagnosed with sepsis,-sepsis pathway was activated, initiated with IV fluid resuscitation, broad-spectrum antibiotics cefepime Blood and urine cultures were obtained Stop the source of sepsis is more urinary to pneumonia  Requested patient to be admitted for sepsis    Assessment & Plan:   Principal Problem:   Sepsis ([place]) Active Problems:   Acute metabolic encephalopathy   Sepsis secondary to UTI ([place])   Acute cystitis without hematuria    Chronic diastolic CHF (congestive heart failure) (HCC)   Paroxysmal atrial fibrillation (HCC)   Dyslipidemia   Type 2 diabetes mellitus ([place])   Essential hypertension, benign   Anemia of chronic disease   Body mass index 29.0-29.9, adult   Chronic kidney disease, stage III (moderate) (HCC)   Generalized weakness     Assessment and Plan: * Sepsis ([place]) -Met sepsis criteria on admission:  -GDJ:MEQAST acid 5.2, WBC 14.0, Tmax 100.4, HR 124, RR 31, BP 65/45, Cr. 1.22 -UA: Leukocytosis, WBC 21-50, -Chest x-ray opacity, cannot rule out pneumonia  -At this point patient met sepsis criteria-sepsis pathway initiated, with aggressive IV fluid resuscitation, broad-spectrum antibiotics cefepime  -Patient will be admitted to stepdown unit-continue with aggressive IV fluid resuscitation, current IV antibiotics with cefepime  -Monitoring BP closely -Will follow-up with cultures of blood and urine   Sepsis secondary to UTI ([place]) -Follow-up urine cultures continue with current antibiotics cefepime -  Acute metabolic encephalopathy - Likely due to sepsis -Neurochecks, monitoring mentation closely -Treating underlying causes  Chronic diastolic CHF (congestive heart failure) ([place]) -Medical record indicates history of diastolic CHF -[facility]/17/9621 at [facility], EJ EF 60-65% negative valvular abnormalities, normal systolic function -Unfortunately patient needs aggressive IV fluid resuscitation due to hypotension and sepsis -  Monitoring closely I's and O's, daily weight -Once patient stable, if develops symptoms, will initiate diuretics  Acute cystitis without hematuria - Recent diagnosis of UTI, failed outpatient treatment -UA once again indicates possible UTI, will follow-up with cultures -Recommend laxative cefepime  Paroxysmal atrial fibrillation (HCC) - Continue home medication of apixaban -Tachycardic due to sepsis -Monitoring closely  Dyslipidemia - Withholding home medication  simvastatin -Assess, the benefit versus risk of statins will be analyzed at her age  Type 2 diabetes mellitus ([place]) -Holding her home medication of metformin -Checking CBG q. ACHS, coverage  Generalized weakness -Fall precautions PT OT -Underlying cause  Chronic kidney disease, stage III (moderate) (HCC) - Stable history of CKD stage IIIa -Trend BUN/creatinine Lab Results  Component Value Date   CREATININE 1.22 (H) 11/23/2022   CREATININE 0.83 10/08/2022   CREATININE 0.80 10/06/2022   -Doing nephrotoxins  Body mass index 29.0-29.9, adult .Body mass index is 26.17 kg/m. -Continue nutritional intake Once stable will consult nutrition  Anemia of chronic disease -Monitoring hemoglobin closely -stable    Latest Ref Rng & Units 11/23/2022    6:44 AM 10/08/2022    6:32 AM 10/06/2022    4:56 AM  CBC  WBC 4.0 - 10.5 K/uL 14.0  8.6  6.2   Hemoglobin 12.0 - 15.0 g/dL 11.6  9.2  9.2   Hematocrit 36.0 - 46.0 % 36.2  27.8  27.8   Platelets 150 - 400 K/uL 313  367  332      Essential hypertension, benign -  Hypotensive due to sepsis-holding home BP meds (atenolol)            ----------------------------------------------------------------------------------------------------------------------------------------------- Nutritional status:  The patient's BMI is: Body mass index is 28.68 kg/m. I agree with the assessment and plan as outlined ------------------------------------------------------------------------------------------------------------------------ Cultures; Blood Cultures x 2 >> NGT Urine Culture  >>> NGT   ----------------------------------------------------------------------------------------------------------------------------  DVT prophylaxis:  apixaban (ELIQUIS) tablet 2.5 mg Start: 11/23/22 1000 TED hose Start: 11/23/22 0837 SCDs Start: 11/23/22 0837 apixaban (ELIQUIS) tablet 2.5 mg   Code Status:   Code Status: Full Code  Family Communication:  No family member present at bedside- attempt will be made to update daily The above findings and plan of care has been discussed with patient (and family)  in detail,  they expressed understanding and agreement of above. -Advance care planning has been discussed.   Admission status:   Status is: Inpatient Remains inpatient appropriate because: Still needing aggressive IV fluid resuscitation, IV antibiotics, treating of sepsis, tachycardia, tachypnea     Procedures:   No admission procedures for hospital encounter.   Antimicrobials:  Anti-infectives (From admission, onward)    Start     Dose/Rate Route Frequency Ordered Stop   11/24/22 0800  ceFEPIme (MAXIPIME) 1 g in sodium chloride 0.9 % 100 mL IVPB  Status:  Discontinued        1 g 200 mL/hr over 30 Minutes Intravenous Every 24 hours 11/23/22 0848 11/23/22 1145   11/24/22 0800  ceFEPIme (MAXIPIME) 2 g in sodium chloride 0.9 % 100 mL IVPB        2 g 200 mL/hr over 30 Minutes Intravenous Every 24 hours 11/23/22 1145 11/30/22 0759   11/23/22 0845  ceFEPIme (MAXIPIME) 2 g in sodium chloride 0.9 % 100 mL IVPB  Status:  Discontinued        2 g 200 mL/hr over 30 Minutes Intravenous  Once 11/23/22 0841 11/23/22 0851   11/23/22 0630  ceFEPIme (MAXIPIME) 2 g in sodium  chloride 0.9 % 100 mL IVPB        2 g 200 mL/hr over 30 Minutes Intravenous  Once 11/23/22 0626 11/23/22 0723   11/23/22 0630  metroNIDAZOLE (FLAGYL) IVPB 500 mg        500 mg 100 mL/hr over 60 Minutes Intravenous  Once 11/23/22 0626 11/23/22 0836   11/23/22 0630  vancomycin (VANCOCIN) IVPB 1000 mg/200 mL premix  Status:  Discontinued        1,000 mg 200 mL/hr over 60 Minutes Intravenous  Once 11/23/22 0626 11/23/22 0627   11/23/22 0630  vancomycin (VANCOREADY) IVPB 1250 mg/250 mL        1,250 mg 166.7 mL/hr over 90 Minutes Intravenous  Once 11/23/22 0628 11/23/22 0938        Medication:   alteplase  2 mg Intracatheter Once   apixaban  2.5 mg Oral BID    Chlorhexidine Gluconate Cloth  6 each Topical Q0600   diltiazem  30 mg Oral Q12H   insulin aspart  0-6 Units Subcutaneous TID WC   multivitamin  15 mL Oral Daily   sodium chloride flush  3 mL Intravenous Q12H   sodium chloride flush  3 mL Intravenous Q12H    sodium chloride, acetaminophen **OR** acetaminophen, amiodarone, bisacodyl, hydrALAZINE, HYDROmorphone (DILAUDID) injection, ipratropium, levalbuterol, ondansetron **OR** ondansetron (ZOFRAN) IV, mouth rinse, oxyCODONE, senna-docusate, sodium chloride flush, sodium phosphate, traZODone   Objective:   Vitals:   11/24/22 0530 11/24/22 0600 11/24/22 0700 11/24/22 0800  BP: 126/74  127/60 (!) 133/94  Pulse:   (!) 25   Resp: 20 (!) 26 (!) 24 (!) 34  Temp:    98.5 F (36.9 C)  TempSrc:    Oral  SpO2:      Weight:      Height:        Intake/Output Summary (Last 24 hours) at 11/24/2022 1033 Last data filed at 11/24/2022 0800 Gross per 24 hour  Intake 4849.08 ml  Output 400 ml  Net 4449.08 ml   Filed Weights   11/23/22 0615 11/24/22 0432  Weight: 60.8 kg 66.6 kg     Examination:   Physical Exam  Constitution: Much more alert, cooperative, no distress,  Appears calm and comfortable  Psychiatric:   Normal and stable mood and affect, cognition intact,   HEENT:        Normocephalic, PERRL, otherwise with in Normal limits  Chest:         Chest symmetric Cardio vascular:  S1/S2, RRR, No murmure, No Rubs or Gallops  pulmonary: Clear to auscultation bilaterally, respirations unlabored, negative wheezes / crackles Abdomen: Soft, non-tender, non-distended, bowel sounds,no masses, no organomegaly Muscular skeletal: Global generalized weaknesses- Limited exam - in bed, able to move all 4 extremities,   Neuro: CNII-XII intact. , normal motor and sensation, reflexes intact  Extremities: No pitting edema lower extremities, +2 pulses  Skin: Dry, warm to touch, negative for any Rashes, No open wounds Wounds: per nursing  documentation   ------------------------------------------------------------------------------------------------------------------------------------------    LABs:     Latest Ref Rng & Units 11/24/2022    3:36 AM 11/23/2022   10:32 AM 11/23/2022    6:44 AM  CBC  WBC 4.0 - 10.5 K/uL 7.9  11.5  14.0   Hemoglobin 12.0 - 15.0 g/dL 9.5  11.5  11.6   Hematocrit 36.0 - 46.0 % 29.6  36.6  36.2   Platelets 150 - 400 K/uL 251  268  313       Latest Ref  Rng & Units 11/24/2022    3:36 AM 11/23/2022    6:44 AM 10/08/2022    6:32 AM  CMP  Glucose 70 - 99 mg/dL 131  195  197   BUN 8 - 23 mg/dL 36  35  19   Creatinine 0.44 - 1.00 mg/dL 0.75  1.22  0.83   Sodium 135 - 145 mmol/L 133  131  131   Potassium 3.5 - 5.1 mmol/L 3.8  4.4  4.1   Chloride 98 - 111 mmol/L 102  98  101   CO2 22 - 32 mmol/L 22  19  23    Calcium 8.9 - 10.3 mg/dL 8.3  8.9  8.2   Total Protein 6.5 - 8.1 g/dL  6.9    Total Bilirubin 0.3 - 1.2 mg/dL  1.5    Alkaline Phos 38 - 126 U/L  54    AST 15 - 41 U/L  320    ALT 0 - 44 U/L  157         Micro Results Recent Results (from the past 240 hour(s))  Resp panel by RT-PCR (RSV, Flu A&B, Covid) Anterior Nasal Swab     Status: None   Collection Time: 11/23/22  6:26 AM   Specimen: Anterior Nasal Swab  Result Value Ref Range Status   SARS Coronavirus 2 by RT PCR NEGATIVE NEGATIVE Final    Comment: (NOTE) SARS-CoV-2 target nucleic acids are NOT DETECTED.  The SARS-CoV-2 RNA is generally detectable in upper respiratory specimens during the acute phase of infection. The lowest concentration of SARS-CoV-2 viral copies this assay can detect is 138 copies/mL. A negative result does not preclude SARS-Cov-2 infection and should not be used as the sole basis for treatment or other patient management decisions. A negative result may occur with  improper specimen collection/handling, submission of specimen other than nasopharyngeal swab, presence of viral mutation(s) within the areas  targeted by this assay, and inadequate number of viral copies(<138 copies/mL). A negative result must be combined with clinical observations, patient history, and epidemiological information. The expected result is Negative.  Fact Sheet for Patients:  EntrepreneurPulse.com.au  Fact Sheet for Healthcare Providers:  IncredibleEmployment.be  This test is no t yet approved or cleared by the [place] FDA and  has been authorized for detection and/or diagnosis of SARS-CoV-2 by FDA under an Emergency Use Authorization (EUA). This EUA will remain  in effect (meaning this test can be used) for the duration of the COVID-19 declaration under Section 564(b)(1) of the Act, 21 U.S.C.section 360bbb-3(b)(1), unless the authorization is terminated  or revoked sooner.       Influenza A by PCR NEGATIVE NEGATIVE Final   Influenza B by PCR NEGATIVE NEGATIVE Final    Comment: (NOTE) The Xpert Xpress SARS-CoV-2/FLU/RSV plus assay is intended as an aid in the diagnosis of influenza from Nasopharyngeal swab specimens and should not be used as a sole basis for treatment. Nasal washings and aspirates are unacceptable for Xpert Xpress SARS-CoV-2/FLU/RSV testing.  Fact Sheet for Patients: EntrepreneurPulse.com.au  Fact Sheet for Healthcare Providers: IncredibleEmployment.be  This test is not yet approved or cleared by the [place] FDA and has been authorized for detection and/or diagnosis of SARS-CoV-2 by FDA under an Emergency Use Authorization (EUA). This EUA will remain in effect (meaning this test can be used) for the duration of the COVID-19 declaration under Section 564(b)(1) of the Act, 21 U.S.C. section 360bbb-3(b)(1), unless the authorization is terminated or revoked.     Resp  Syncytial Virus by PCR NEGATIVE NEGATIVE Final    Comment: (NOTE) Fact Sheet for  Patients: EntrepreneurPulse.com.au  Fact Sheet for Healthcare Providers: IncredibleEmployment.be  This test is not yet approved or cleared by the [place] FDA and has been authorized for detection and/or diagnosis of SARS-CoV-2 by FDA under an Emergency Use Authorization (EUA). This EUA will remain in effect (meaning this test can be used) for the duration of the COVID-19 declaration under Section 564(b)(1) of the Act, 21 U.S.C. section 360bbb-3(b)(1), unless the authorization is terminated or revoked.  Performed at [facility], [address], [place], [place] [address]   Blood Culture (routine x 2)     Status: None (Preliminary result)   Collection Time: 11/23/22  6:32 AM   Specimen: BLOOD  Result Value Ref Range Status   Specimen Description BLOOD BLOOD LEFT ARM  Final   Special Requests   Final    BOTTLES DRAWN AEROBIC AND ANAEROBIC Blood Culture results may not be optimal due to an inadequate volume of blood received in culture bottles   Culture   Final    NO GROWTH < 24 HOURS Performed at [facility], [address], [place], [place] [address]    Report Status PENDING  Incomplete  Blood Culture (routine x 2)     Status: None (Preliminary result)   Collection Time: 11/23/22  6:42 AM   Specimen: BLOOD  Result Value Ref Range Status   Specimen Description BLOOD BLOOD RIGHT HAND  Final   Special Requests   Final    BOTTLES DRAWN AEROBIC AND ANAEROBIC Blood Culture results may not be optimal due to an inadequate volume of blood received in culture bottles   Culture   Final    NO GROWTH < 24 HOURS Performed at [facility], [address] [place], [place] [address]    Report Status PENDING  Incomplete  MRSA Next Gen by PCR, Nasal     Status: None   Collection Time: 11/23/22  9:57 AM   Specimen: Nasal Mucosa; Nasal Swab  Result Value Ref Range Status   MRSA by PCR Next Gen NOT DETECTED NOT DETECTED Final    Comment: (NOTE) The  GeneXpert MRSA Assay (FDA approved for NASAL specimens only), is one component of a comprehensive MRSA colonization surveillance program. It is not intended to diagnose MRSA infection nor to guide or monitor treatment for MRSA infections. Test performance is not FDA approved in patients less than 57 years old. Performed at [facility], [address] [place], [place] [address]     Radiology Reports DG CHEST PORT 1 VIEW  Result Date: 11/23/2022 CLINICAL DATA:  Central line placement. EXAM: PORTABLE CHEST 1 VIEW COMPARISON:  11/23/2022. FINDINGS: Heart is enlarged and the mediastinal contour stable. There is atherosclerotic calcification of the aorta. The distal tip of a right PICC line terminates at the cavoatrial junction. Lung volumes are low and there is chronic elevation of the right diaphragm. Interstitial prominence is present bilaterally and mild patchy airspace disease is noted at the lung bases. No effusion or pneumothorax. Cholecystectomy clips are noted in the right upper quadrant. No acute osseous abnormality. IMPRESSION: 1. Right PICC line terminates at the cavoatrial junction. 2. Cardiomegaly. 3. Interstitial prominence bilaterally and patchy airspace disease at the lung bases, possible atelectasis, edema or infiltrate. Electronically Signed   By: Brett Fairy M.D.   On: 11/23/2022 20:10   [place] EKG SITE RITE  Result Date: 11/23/2022 If Site Rite image not attached, placement could  not be confirmed due to current cardiac rhythm.   SIGNED: Deatra James, MD, FHM. [facility],  Pager (please use [email or web address] to page/text) Please use Epic Secure Chat for non-urgent communication (7AM-7PM)  If 7PM-7AM, please contact night-coverage [email or web address], 11/24/2022, 10:33 AM

## 2022-11-24 NOTE — Progress Notes (Signed)
Patient has a PICC that was placed yesterday. I attempted to start an antibiotic in open line and could not get line to flow or pull back blood. While performing this the line that was previously flowing stopped working and I had to flush this line to get it restarted. Will talk possible solutions to this problem with Doctor Shahmehdi when he arrives on floor. 0

## 2022-11-25 DIAGNOSIS — R6520 Severe sepsis without septic shock: Secondary | ICD-10-CM | POA: Diagnosis not present

## 2022-11-25 DIAGNOSIS — A419 Sepsis, unspecified organism: Secondary | ICD-10-CM | POA: Diagnosis not present

## 2022-11-25 LAB — CBC
HCT: 27.2 % — ABNORMAL LOW (ref 36.0–46.0)
Hemoglobin: 8.8 g/dL — ABNORMAL LOW (ref 12.0–15.0)
MCH: 29.6 pg (ref 26.0–34.0)
MCHC: 32.4 g/dL (ref 30.0–36.0)
MCV: 91.6 fL (ref 80.0–100.0)
Platelets: 223 10*3/uL (ref 150–400)
RBC: 2.97 MIL/uL — ABNORMAL LOW (ref 3.87–5.11)
RDW: 15.9 % — ABNORMAL HIGH (ref 11.5–15.5)
WBC: 5.9 10*3/uL (ref 4.0–10.5)
nRBC: 0 % (ref 0.0–0.2)

## 2022-11-25 LAB — GLUCOSE, CAPILLARY
Glucose-Capillary: 149 mg/dL — ABNORMAL HIGH (ref 70–99)
Glucose-Capillary: 170 mg/dL — ABNORMAL HIGH (ref 70–99)
Glucose-Capillary: 144 mg/dL — ABNORMAL HIGH (ref 70–99)
Glucose-Capillary: 147 mg/dL — ABNORMAL HIGH (ref 70–99)

## 2022-11-25 LAB — BASIC METABOLIC PANEL
Anion gap: 9 (ref 5–15)
BUN: 26 mg/dL — ABNORMAL HIGH (ref 8–23)
CO2: 20 mmol/L — ABNORMAL LOW (ref 22–32)
Calcium: 8.3 mg/dL — ABNORMAL LOW (ref 8.9–10.3)
Chloride: 104 mmol/L (ref 98–111)
Creatinine, Ser: 0.64 mg/dL (ref 0.44–1.00)
GFR, Estimated: >60 mL/min (ref >60)
Glucose, Bld: 148 mg/dL — ABNORMAL HIGH (ref 70–99)
Potassium: 3.5 mmol/L (ref 3.5–5.1)
Sodium: 133 mmol/L — ABNORMAL LOW (ref 135–145)

## 2022-11-25 LAB — LACTIC ACID, PLASMA: Lactic Acid, Venous: 1 mmol/L (ref 0.5–1.9)

## 2022-11-25 LAB — MAGNESIUM: Magnesium: 1.2 mg/dL — ABNORMAL LOW (ref 1.7–2.4)

## 2022-11-25 MED ORDER — DILTIAZEM HCL 30 MG PO TABS
30.0000 mg | ORAL_TABLET | Freq: Four times a day (QID) | ORAL | Status: DC
  Administered 2022-11-25 – 2022-11-26 (×6): 30 mg via ORAL
  Filled 2022-11-25 (×7): qty 1

## 2022-11-25 MED ORDER — DILTIAZEM HCL 25 MG/5ML IV SOLN
10.0000 mg | Freq: Once | INTRAVENOUS | Status: AC
  Administered 2022-11-25: 10 mg via INTRAVENOUS
  Filled 2022-11-25: qty 5

## 2022-11-25 MED ORDER — RISAQUAD PO CAPS
2.0000 | ORAL_CAPSULE | Freq: Three times a day (TID) | ORAL | Status: DC
  Administered 2022-11-25 (×2): 2 via ORAL
  Filled 2022-11-25 (×2): qty 2

## 2022-11-25 MED ORDER — MAGNESIUM SULFATE 2 GM/50ML IV SOLN
2.0000 g | Freq: Once | INTRAVENOUS | Status: AC
  Administered 2022-11-25: 2 g via INTRAVENOUS
  Filled 2022-11-25: qty 50

## 2022-11-25 MED ORDER — POTASSIUM CHLORIDE 10 MEQ/100ML IV SOLN
10.0000 meq | INTRAVENOUS | Status: AC
  Administered 2022-11-25 (×4): 10 meq via INTRAVENOUS
  Filled 2022-11-25 (×4): qty 100

## 2022-11-25 MED ORDER — LACTATED RINGERS IV BOLUS: 1000.0000 mL | Freq: Once | INTRAVENOUS | Status: DC

## 2022-11-25 MED ORDER — ATENOLOL 25 MG PO TABS
100.0000 mg | ORAL_TABLET | Freq: Two times a day (BID) | ORAL | Status: DC
  Administered 2022-11-25 – 2022-11-26 (×3): 100 mg via ORAL
  Filled 2022-11-25 (×3): qty 4

## 2022-11-25 NOTE — Progress Notes (Signed)
PROGRESS NOTE    Patient: [name]                            PCP: Manon Hilding, MD                    DOB: May 24, 1927            DOA: 11/23/2022 IRJ:[id number]             DOS: 11/25/2022, 9:29 AM   LOS: 2 days   Date of Service: The patient was seen and examined on 11/25/2022  Subjective:   The patient was seen and examined this morning.  Awake alert oriented, following, in no acute distress satting 99% on room air  Tachycardic, hypertensive--otherwise stable   Brief Narrative:   [name] is a 73 old female presenting from home chief complaint of being lethargic since yesterday.  Past medical history of P-A-fib (on apixaban),  HTN, HLD,, hyperlipidemia, DM II, ?  Systolic CHF. ..  Patient was recently diagnosed with urine tract infection has been on oral antibiotics.  Family reports patient has becoming more weak, lethargic-sitting in a chair since 4 PM yesterday with poor p.o. intake minimally interactive.   ED course: Upon arrival patient was found lethargic with G CS verbal subscore 5 Blood pressure (!) 77/60, pulse (!) 124, temperature (!) 100.4 F (38 C), temperature source Rectal, resp. rate (!) 31, height 5' (1.524 m), weight 60.8 kg, SpO2 98 %. P as low as 65/45 Lactic acid 5.2, WBC of 14, creatinine 1.22, glucose of 195 UA: Trace of leukocyte esterase, negative for nitrites, UA WBC 21-50, Chest x-ray:Retrocardiac opacity suspicious for pneumonia   Patient was diagnosed with sepsis,-sepsis pathway was activated, initiated with IV fluid resuscitation, broad-spectrum antibiotics cefepime Blood and urine cultures were obtained Stop the source of sepsis is more urinary to pneumonia  Requested patient to be admitted for sepsis    Assessment & Plan:   Principal Problem:   Sepsis ([place]) Active Problems:   Acute metabolic encephalopathy   Sepsis secondary to UTI ([place])   Acute cystitis without hematuria   Chronic diastolic CHF (congestive heart failure) (HCC)    Paroxysmal atrial fibrillation (HCC)   Dyslipidemia   Type 2 diabetes mellitus ([place])   Essential hypertension, benign   Anemia of chronic disease   Body mass index 29.0-29.9, adult   Chronic kidney disease, stage III (moderate) (HCC)   Generalized weakness     Assessment and Plan: * Sepsis ([place]) - Hemodynamically stable  -Much improved, resolved sepsis physiology   -Met sepsis criteria on admission:  -TKZ:SWFUXN acid 5.2, WBC 14.0, Tmax 100.4, HR 124, RR 31, BP 65/45, Cr. 1.22 -UA: Leukocytosis, WBC 21-50, -Chest x-ray opacity, cannot rule out pneumonia  -S/p a sepsis.  She is we need to be less than initially something you revealed S/P agressive IV fluid resuscitation, broad-spectrum antibiotics cefepime  -Patient will be admitted to stepdown unit-continue with aggressive IV fluid resuscitation, current IV antibiotics with cefepime  -Monitoring BP closely -Will follow-up with cultures of blood and urine   Sepsis secondary to UTI ([place]) -Follow-up cultures continue with current antibiotics cefepime -Cultures negative to date  Acute metabolic encephalopathy -Much improved mentation-mentation back to baseline - Likely due to sepsis  -Treating underlying causes  Chronic diastolic CHF (congestive heart failure) ([place]) Status post aggressive IV fluid resuscitation, no signs of volume overload, -Will discontinue all IV fluids  Hemodynamically stable -Medical record indicates history of diastolic CHF -11/26/15 at [facility], EJ EF 60-65% negative valvular abnormalities, normal systolic function -Unfortunately patient needs aggressive IV fluid resuscitation due to hypotension and sepsis -Monitoring closely I's and O's, daily weight -Once patient stable, if develops symptoms, will initiate diuretics  Acute cystitis without hematuria - Recent diagnosis of UTI, failed outpatient treatment -UA once again indicates possible UTI,  -Cultures remain negative to  date -Continue IV antibiotics of cefepime  Paroxysmal atrial fibrillation (HCC) - Tachyarrhythmia A-fib overnight -Resuming home medication of atenolol -Briefly was started on amiodarone drip-will be discontinued -Started on Cardizem 30 mg twice a day daily--will be continued   - Continue home medication of apixaban -Monitoring closely  Dyslipidemia - Withholding home medication simvastatin -Assess, the benefit versus risk of statins will be analyzed at her age  Type 2 diabetes mellitus ([place]) -Holding her home medication of metformin -Checking CBG q. ACHS, coverage  Generalized weakness -Fall precautions PT OT -Underlying cause  Chronic kidney disease, stage III (moderate) (HCC) - Improved creatinine - Stable history of CKD stage IIIa -Trend BUN/creatinine Lab Results  Component Value Date   CREATININE 0.64 11/25/2022   CREATININE 0.75 11/24/2022   CREATININE 1.22 (H) 11/23/2022   -Doing nephrotoxins  Body mass index 29.0-29.9, adult .Body mass index is 29.28 kg/m. -Continue nutritional intake Once stable will consult nutrition  Anemia of chronic disease -Monitoring hemoglobin closely -stable    Latest Ref Rng & Units 11/25/2022    4:07 AM 11/24/2022    3:36 AM 11/23/2022   10:32 AM  CBC  WBC 4.0 - 10.5 K/uL 5.9  7.9  11.5   Hemoglobin 12.0 - 15.0 g/dL 8.8  9.5  11.5   Hematocrit 36.0 - 46.0 % 27.2  29.6  36.6   Platelets 150 - 400 K/uL 223  251  268       Essential hypertension, benign -  Hypotensive due to sepsis-holding home BP meds (atenolol) -Hypotension has resolved, resuming home medication including atenolol            ----------------------------------------------------------------------------------------------------------------------------------------------- Nutritional status:  The patient's BMI is: Body mass index is 29.28 kg/m. I agree with the assessment and plan as outlined  ------------------------------------------------------------------------------------------------------------------------ Cultures; Blood Cultures x 2 >> NGT Urine Culture  >>> NGT   ----------------------------------------------------------------------------------------------------------------------------  DVT prophylaxis:  apixaban (ELIQUIS) tablet 2.5 mg Start: 11/23/22 1000 TED hose Start: 11/23/22 0837 SCDs Start: 11/23/22 0837 apixaban (ELIQUIS) tablet 2.5 mg   Code Status:   Code Status: Full Code  Family Communication: No family member present at bedside- attempt will be made to update daily The above findings and plan of care has been discussed with patient (and family)  in detail,  they expressed understanding and agreement of above. -Advance care planning has been discussed.   Admission status:   Status is: Inpatient Remains inpatient appropriate because: Still needing aggressive IV fluid resuscitation, IV antibiotics, treating of sepsis, tachycardia, tachypnea     Procedures:   No admission procedures for hospital encounter.   Antimicrobials:  Anti-infectives (From admission, onward)    Start     Dose/Rate Route Frequency Ordered Stop   11/24/22 0800  ceFEPIme (MAXIPIME) 1 g in sodium chloride 0.9 % 100 mL IVPB  Status:  Discontinued        1 g 200 mL/hr over 30 Minutes Intravenous Every 24 hours 11/23/22 0848 11/23/22 1145   11/24/22 0800  ceFEPIme (MAXIPIME) 2 g in sodium chloride 0.9 % 100 mL IVPB  2 g 200 mL/hr over 30 Minutes Intravenous Every 24 hours 11/23/22 1145 11/30/22 0759   11/23/22 0845  ceFEPIme (MAXIPIME) 2 g in sodium chloride 0.9 % 100 mL IVPB  Status:  Discontinued        2 g 200 mL/hr over 30 Minutes Intravenous  Once 11/23/22 0841 11/23/22 0851   11/23/22 0630  ceFEPIme (MAXIPIME) 2 g in sodium chloride 0.9 % 100 mL IVPB        2 g 200 mL/hr over 30 Minutes Intravenous  Once 11/23/22 0626 11/23/22 0723   11/23/22 0630   metroNIDAZOLE (FLAGYL) IVPB 500 mg        500 mg 100 mL/hr over 60 Minutes Intravenous  Once 11/23/22 0626 11/23/22 0836   11/23/22 0630  vancomycin (VANCOCIN) IVPB 1000 mg/200 mL premix  Status:  Discontinued        1,000 mg 200 mL/hr over 60 Minutes Intravenous  Once 11/23/22 0626 11/23/22 0627   11/23/22 0630  vancomycin (VANCOREADY) IVPB 1250 mg/250 mL        1,250 mg 166.7 mL/hr over 90 Minutes Intravenous  Once 11/23/22 0628 11/23/22 0938        Medication:   alteplase  2 mg Intracatheter Once   apixaban  2.5 mg Oral BID   atenolol  100 mg Oral BID   Chlorhexidine Gluconate Cloth  6 each Topical Q0600   diltiazem  30 mg Oral Q6H   insulin aspart  0-6 Units Subcutaneous TID WC   multivitamin  15 mL Oral Daily   sodium chloride flush  3 mL Intravenous Q12H   sodium chloride flush  3 mL Intravenous Q12H    sodium chloride, acetaminophen **OR** acetaminophen, bisacodyl, hydrALAZINE, HYDROmorphone (DILAUDID) injection, ipratropium, levalbuterol, ondansetron **OR** ondansetron (ZOFRAN) IV, mouth rinse, oxyCODONE, senna-docusate, sodium chloride flush, sodium phosphate, traZODone   Objective:   Vitals:   11/25/22 0600 11/25/22 0700 11/25/22 0800 11/25/22 0900  BP: (!) 143/53 (!) 149/62 (!) 134/103 (!) 153/91  Pulse: 62 95 (!) 143 (!) 137  Resp: 19 19 (!) 27 (!) 35  Temp:      TempSrc:      SpO2: 100% 99% 98% 99%  Weight:      Height:        Intake/Output Summary (Last 24 hours) at 11/25/2022 0929 Last data filed at 11/25/2022 0900 Gross per 24 hour  Intake 1697.63 ml  Output 1550 ml  Net 147.63 ml   Filed Weights   11/23/22 0615 11/24/22 0432 11/25/22 0455  Weight: 60.8 kg 66.6 kg 68 kg     Examination:        General:  AAO x 3,  cooperative, no distress;   HEENT:  Normocephalic, PERRL, otherwise with in Normal limits   Neuro:  CNII-XII intact. , normal motor and sensation, reflexes intact   Lungs:   Clear to auscultation BL, Respirations unlabored,  No  wheezes / crackles  Cardio:    S1/S2, RRR, No murmure, No Rubs or Gallops   Abdomen:  Soft, non-tender, bowel sounds active all four quadrants, no guarding or peritoneal signs.  Muscular  skeletal:  Limited exam -global generalized weaknesses - in bed, able to move all 4 extremities,   2+ pulses,  symmetric, No pitting edema  Skin:  Dry, warm to touch, negative for any Rashes,  Wounds: Please see nursing documentation         ------------------------------------------------------------------------------------------------------------------------------------------    LABs:     Latest Ref Rng & Units 11/25/2022  4:07 AM 11/24/2022    3:36 AM 11/23/2022   10:32 AM  CBC  WBC 4.0 - 10.5 K/uL 5.9  7.9  11.5   Hemoglobin 12.0 - 15.0 g/dL 8.8  9.5  11.5   Hematocrit 36.0 - 46.0 % 27.2  29.6  36.6   Platelets 150 - 400 K/uL 223  251  268       Latest Ref Rng & Units 11/25/2022    4:07 AM 11/24/2022    3:36 AM 11/23/2022    6:44 AM  CMP  Glucose 70 - 99 mg/dL 148  131  195   BUN 8 - 23 mg/dL 26  36  35   Creatinine 0.44 - 1.00 mg/dL 0.64  0.75  1.22   Sodium 135 - 145 mmol/L 133  133  131   Potassium 3.5 - 5.1 mmol/L 3.5  3.8  4.4   Chloride 98 - 111 mmol/L 104  102  98   CO2 22 - 32 mmol/L 20  22  19    Calcium 8.9 - 10.3 mg/dL 8.3  8.3  8.9   Total Protein 6.5 - 8.1 g/dL   6.9   Total Bilirubin 0.3 - 1.2 mg/dL   1.5   Alkaline Phos 38 - 126 U/L   54   AST 15 - 41 U/L   320   ALT 0 - 44 U/L   157        Micro Results Recent Results (from the past 240 hour(s))  Resp panel by RT-PCR (RSV, Flu A&B, Covid) Anterior Nasal Swab     Status: None   Collection Time: 11/23/22  6:26 AM   Specimen: Anterior Nasal Swab  Result Value Ref Range Status   SARS Coronavirus 2 by RT PCR NEGATIVE NEGATIVE Final    Comment: (NOTE) SARS-CoV-2 target nucleic acids are NOT DETECTED.  The SARS-CoV-2 RNA is generally detectable in upper respiratory specimens during the acute phase of infection. The  lowest concentration of SARS-CoV-2 viral copies this assay can detect is 138 copies/mL. A negative result does not preclude SARS-Cov-2 infection and should not be used as the sole basis for treatment or other patient management decisions. A negative result may occur with  improper specimen collection/handling, submission of specimen other than nasopharyngeal swab, presence of viral mutation(s) within the areas targeted by this assay, and inadequate number of viral copies(<138 copies/mL). A negative result must be combined with clinical observations, patient history, and epidemiological information. The expected result is Negative.  Fact Sheet for Patients:  EntrepreneurPulse.com.au  Fact Sheet for Healthcare Providers:  IncredibleEmployment.be  This test is no t yet approved or cleared by the [place] FDA and  has been authorized for detection and/or diagnosis of SARS-CoV-2 by FDA under an Emergency Use Authorization (EUA). This EUA will remain  in effect (meaning this test can be used) for the duration of the COVID-19 declaration under Section 564(b)(1) of the Act, 21 U.S.C.section 360bbb-3(b)(1), unless the authorization is terminated  or revoked sooner.       Influenza A by PCR NEGATIVE NEGATIVE Final   Influenza B by PCR NEGATIVE NEGATIVE Final    Comment: (NOTE) The Xpert Xpress SARS-CoV-2/FLU/RSV plus assay is intended as an aid in the diagnosis of influenza from Nasopharyngeal swab specimens and should not be used as a sole basis for treatment. Nasal washings and aspirates are unacceptable for Xpert Xpress SARS-CoV-2/FLU/RSV testing.  Fact Sheet for Patients: EntrepreneurPulse.com.au  Fact Sheet for Healthcare Providers: IncredibleEmployment.be  This test  is not yet approved or cleared by the [place] and has been authorized for detection and/or diagnosis of SARS-CoV-2 by FDA under  an Emergency Use Authorization (EUA). This EUA will remain in effect (meaning this test can be used) for the duration of the COVID-19 declaration under Section 564(b)(1) of the Act, 21 U.S.C. section 360bbb-3(b)(1), unless the authorization is terminated or revoked.     Resp Syncytial Virus by PCR NEGATIVE NEGATIVE Final    Comment: (NOTE) Fact Sheet for Patients: EntrepreneurPulse.com.au  Fact Sheet for Healthcare Providers: IncredibleEmployment.be  This test is not yet approved or cleared by the [place] FDA and has been authorized for detection and/or diagnosis of SARS-CoV-2 by FDA under an Emergency Use Authorization (EUA). This EUA will remain in effect (meaning this test can be used) for the duration of the COVID-19 declaration under Section 564(b)(1) of the Act, 21 U.S.C. section 360bbb-3(b)(1), unless the authorization is terminated or revoked.  Performed at [facility], [address] [place], [place] [address]   Urine Culture     Status: None   Collection Time: 11/23/22  6:26 AM   Specimen: In/Out Cath Urine  Result Value Ref Range Status   Specimen Description   Final    IN/OUT CATH URINE Performed at [facility], [address], [place], [place] [address]    Special Requests   Final    NONE Performed at [facility], [address], [place], [place] [address]    Culture   Final    NO GROWTH Performed at [facility], [place] [address] [place], [place] [address]    Report Status 11/24/2022 FINAL  Final  Blood Culture (routine x 2)     Status: None (Preliminary result)   Collection Time: 11/23/22  6:32 AM   Specimen: BLOOD  Result Value Ref Range Status   Specimen Description BLOOD BLOOD LEFT ARM  Final   Special Requests   Final    BOTTLES DRAWN AEROBIC AND ANAEROBIC Blood Culture results may not be optimal due to an inadequate volume of blood received in culture bottles   Culture   Final    NO GROWTH 2  DAYS Performed at [facility], [address] [place], [place] [address]    Report Status PENDING  Incomplete  Blood Culture (routine x 2)     Status: None (Preliminary result)   Collection Time: 11/23/22  6:42 AM   Specimen: BLOOD  Result Value Ref Range Status   Specimen Description BLOOD BLOOD RIGHT HAND  Final   Special Requests   Final    BOTTLES DRAWN AEROBIC AND ANAEROBIC Blood Culture results may not be optimal due to an inadequate volume of blood received in culture bottles   Culture   Final    NO GROWTH 2 DAYS Performed at [facility], [address] [place], [place] [address]    Report Status PENDING  Incomplete  MRSA Next Gen by PCR, Nasal     Status: None   Collection Time: 11/23/22  9:57 AM   Specimen: Nasal Mucosa; Nasal Swab  Result Value Ref Range Status   MRSA by PCR Next Gen NOT DETECTED NOT DETECTED Final    Comment: (NOTE) The GeneXpert MRSA Assay (FDA approved for NASAL specimens only), is one component of a comprehensive MRSA colonization surveillance program. It is not intended to diagnose MRSA infection nor to guide or monitor treatment for MRSA infections. Test performance is not FDA approved in patients less than 2  years old. Performed at [facility], [address], [place], [place] [address]     Radiology Reports No results found.  SIGNED: Deatra James, MD, FHM. [facility],  Pager (please use [email or web address] to page/text) Please use Epic Secure Chat for non-urgent communication (7AM-7PM)  If 7PM-7AM, please contact night-coverage [email or web address], 11/25/2022, 9:29 AM

## 2022-11-25 NOTE — Plan of Care (Signed)

## 2022-11-26 DIAGNOSIS — E7801 Familial hypercholesterolemia: Secondary | ICD-10-CM | POA: Diagnosis not present

## 2022-11-26 DIAGNOSIS — R6520 Severe sepsis without septic shock: Secondary | ICD-10-CM | POA: Diagnosis not present

## 2022-11-26 DIAGNOSIS — A419 Sepsis, unspecified organism: Secondary | ICD-10-CM | POA: Diagnosis not present

## 2022-11-26 DIAGNOSIS — E876 Hypokalemia: Secondary | ICD-10-CM | POA: Diagnosis not present

## 2022-11-26 DIAGNOSIS — D649 Anemia, unspecified: Secondary | ICD-10-CM | POA: Diagnosis not present

## 2022-11-26 DIAGNOSIS — N183 Chronic kidney disease, stage 3 unspecified: Secondary | ICD-10-CM | POA: Diagnosis not present

## 2022-11-26 DIAGNOSIS — E1165 Type 2 diabetes mellitus with hyperglycemia: Secondary | ICD-10-CM | POA: Diagnosis not present

## 2022-11-26 LAB — BASIC METABOLIC PANEL
Anion gap: 10 (ref 5–15)
BUN: 24 mg/dL — ABNORMAL HIGH (ref 8–23)
CO2: 17 mmol/L — ABNORMAL LOW (ref 22–32)
Calcium: 8.6 mg/dL — ABNORMAL LOW (ref 8.9–10.3)
Chloride: 103 mmol/L (ref 98–111)
Creatinine, Ser: 0.67 mg/dL (ref 0.44–1.00)
GFR, Estimated: >60 mL/min (ref >60)
Glucose, Bld: 142 mg/dL — ABNORMAL HIGH (ref 70–99)
Potassium: 4.4 mmol/L (ref 3.5–5.1)
Sodium: 130 mmol/L — ABNORMAL LOW (ref 135–145)

## 2022-11-26 LAB — CBC
HCT: 31.8 % — ABNORMAL LOW (ref 36.0–46.0)
Hemoglobin: 10.3 g/dL — ABNORMAL LOW (ref 12.0–15.0)
MCH: 29.9 pg (ref 26.0–34.0)
MCHC: 32.4 g/dL (ref 30.0–36.0)
MCV: 92.2 fL (ref 80.0–100.0)
Platelets: 190 10*3/uL (ref 150–400)
RBC: 3.45 MIL/uL — ABNORMAL LOW (ref 3.87–5.11)
RDW: 15.9 % — ABNORMAL HIGH (ref 11.5–15.5)
WBC: 8.4 10*3/uL (ref 4.0–10.5)
nRBC: 0 % (ref 0.0–0.2)

## 2022-11-26 LAB — GLUCOSE, CAPILLARY
Glucose-Capillary: 146 mg/dL — ABNORMAL HIGH (ref 70–99)
Glucose-Capillary: 172 mg/dL — ABNORMAL HIGH (ref 70–99)

## 2022-11-26 MED ORDER — DILTIAZEM HCL ER COATED BEADS 120 MG PO CP24: 120.0000 mg | ORAL_CAPSULE | Freq: Every day | ORAL | 0 refills | Status: DC

## 2022-11-26 MED ORDER — CIPROFLOXACIN HCL 500 MG PO TABS: 500.0000 mg | ORAL_TABLET | Freq: Two times a day (BID) | ORAL | 0 refills | Status: AC

## 2022-11-26 MED ORDER — APIXABAN 2.5 MG PO TABS: 2.5000 mg | ORAL_TABLET | Freq: Two times a day (BID) | ORAL | 0 refills | Status: DC

## 2022-11-26 MED ORDER — LACTINEX PO CHEW: 1.0000 | CHEWABLE_TABLET | Freq: Three times a day (TID) | ORAL | 0 refills | Status: AC

## 2022-11-26 MED ORDER — ADULT MULTIVITAMIN LIQUID CH: 15.0000 mL | Freq: Every day | ORAL | 1 refills | Status: DC

## 2022-11-26 MED ORDER — CIPROFLOXACIN HCL 250 MG PO TABS
500.0000 mg | ORAL_TABLET | Freq: Two times a day (BID) | ORAL | Status: DC
  Administered 2022-11-26: 500 mg via ORAL
  Filled 2022-11-26: qty 2

## 2022-11-26 NOTE — Progress Notes (Signed)
Physical Therapy Treatment Patient Details Name: [name] MRN: [id number] DOB: 1926-11-25 Today's Date: 11/26/2022   History of Present Illness 89 y.o. F admitted on 11/23/22 due to increased lethargy and weakness. Pt undergoing Sepsis work-up. PMH significant for A-fib, HTN, HLD, DM2, CHF.    PT Comments    Patient demonstrates slow labored movement for sitting up at bedside, posterior leaning when putting on socks with falling backwards, limited to a few slow labored side steps at bedside mostly due to c/o increasing bilateral hip pain when standing and tolerated sitting up in chair after therapy.  Patient will benefit from continued skilled physical therapy in hospital and recommended venue below to increase strength, balance, endurance for safe ADLs and gait.     Recommendations for follow up therapy are one component of a multi-disciplinary discharge planning process, led by the attending physician.  Recommendations may be updated based on patient status, additional functional criteria and insurance authorization.  Follow Up Recommendations  Skilled nursing-short term rehab (<3 hours/day) Can patient physically be transported by private vehicle: No   Assistance Recommended at Discharge    Patient can return home with the following A lot of help with walking and/or transfers;A lot of help with bathing/dressing/bathroom;Assistance with cooking/housework;Help with stairs or ramp for entrance   Equipment Recommendations  None recommended by PT    Recommendations for Other Services       Precautions / Restrictions Precautions Precautions: Fall Restrictions Weight Bearing Restrictions: No     Mobility  Bed Mobility Overal bed mobility: Needs Assistance       Supine to sit: Max assist     General bed mobility comments: slow labored movement    Transfers Overall transfer level: Needs assistance Equipment used: Rolling walker (2 wheels) Transfers: Sit to/from Stand, Bed  to chair/wheelchair/BSC Sit to Stand: Mod assist   Step pivot transfers: Mod assist       General transfer comment: difficulty maintaining standing balance due to BLE weakness    Ambulation/Gait Ambulation/Gait assistance: Mod assist, Max assist Gait Distance (Feet): 5 Feet Assistive device: Rolling walker (2 wheels) Gait Pattern/deviations: Decreased step length - left, Decreased stance time - right, Decreased stride length Gait velocity: slow     General Gait Details: limited to a few slow labored unsteady side steps before having to sit due to weakness   Stairs             Wheelchair Mobility    Modified Rankin (Stroke Patients Only)       Balance Overall balance assessment: Needs assistance Sitting-balance support: Feet supported, No upper extremity supported Sitting balance-Leahy Scale: Poor Sitting balance - Comments: fair/poor seated at EOB Postural control: Posterior lean Standing balance support: Reliant on assistive device for balance, Bilateral upper extremity supported Standing balance-Leahy Scale: Poor Standing balance comment: using RW                            Cognition Arousal/Alertness: Awake/alert Behavior During Therapy: WFL for tasks assessed/performed, Flat affect Overall Cognitive Status: No family/caregiver present to determine baseline cognitive functioning                                          Exercises      General Comments General comments (skin integrity, edema, etc.): VSS on RA, O2 monitorred and remaining above 93%  for entirety of session      Pertinent Vitals/Pain Pain Assessment Pain Assessment: Faces Faces Pain Scale: Hurts even more Pain Location: both hips with standing Pain Descriptors / Indicators: Aching, Discomfort, Grimacing Pain Intervention(s): Limited activity within patient's tolerance, Monitored during session, Repositioned    Home Living Family/patient expects to be  discharged to:: Skilled nursing facility                   Additional Comments: Pt lived with daughter prior to admission    Prior Function            PT Goals (current goals can now be found in the care plan section) Acute Rehab PT Goals PT Goal Formulation: With patient/family Time For Goal Achievement: 11/30/22 Potential to Achieve Goals: Good Progress towards PT goals: Progressing toward goals    Frequency    Min 3X/week      PT Plan Current plan remains appropriate    Co-evaluation PT/OT/SLP Co-Evaluation/Treatment: Yes Reason for Co-Treatment: To address functional/ADL transfers PT goals addressed during session: Mobility/safety with mobility;Balance;Proper use of DME        AM-PAC PT "6 Clicks" Mobility   Outcome Measure  Help needed turning from your back to your side while in a flat bed without using bedrails?: A Little Help needed moving from lying on your back to sitting on the side of a flat bed without using bedrails?: A Lot Help needed moving to and from a bed to a chair (including a wheelchair)?: A Lot Help needed standing up from a chair using your arms (e.g., wheelchair or bedside chair)?: A Lot Help needed to walk in hospital room?: A Lot Help needed climbing 3-5 steps with a railing? : A Lot 6 Click Score: 13    End of Session   Activity Tolerance: Patient tolerated treatment well;Patient limited by fatigue;Patient limited by pain Patient left: in chair;with call bell/phone within reach;with chair alarm set Nurse Communication: Mobility status PT Visit Diagnosis: Muscle weakness (generalized) (M62.81);Unsteadiness on feet (R26.81);Other abnormalities of gait and mobility (R26.89)     Time: 7673-4193 PT Time Calculation (min) (ACUTE ONLY): 27 min  Charges:  $Therapeutic Activity: 23-37 mins                     12:27 PM, 11/26/22 Lonell Grandchild, MPT Physical Therapist with [facility] 336 [phone] office [phone]  mobile phone

## 2022-11-26 NOTE — TOC Transition Note (Signed)
Transition of Care [facility]) - CM/SW Discharge Note   Patient Details  Name: [name] MRN: [id number] Date of Birth: 1927-08-06  Transition of Care [facility]) CM/SW Contact:  [name] Arnt, [name] Phone Number: 11/26/2022, 11:16 AM   Clinical Narrative:  Pt d/c today. PT worked with pt again today. Discussed with daughter. MD called as well and daughter wants to take pt home with home health. Discussed if they feel pt will need SNF, HHSW can assist with placement from home. Daughter requests [name]. Referred HHPT, OT, SW to [name] with [name]. Orders in.       Final next level of care: [name] Barriers to Discharge: Continued Medical Work up   Patient Goals and CMS Choice      Discharge Placement                    Name of family member notified: [name]- daughter Patient and family notified of of transfer: 11/26/22  Discharge Plan and Services Additional resources added to the After Visit Summary for   In-house Referral: Clinical Social Work                        HH Arranged: PT, OT, Social Work [place] Agency: [place] Date [facility] Agency Contacted: 11/26/22 Time [place]: 1116 Representative spoke with at [place]: [name] (Grandfather) Interventions [place]: No Food Insecurity (11/23/2022)  Housing: Low Risk  (11/23/2022)  Transportation Needs: No Transportation Needs (11/23/2022)  Utilities: Not At Risk (11/23/2022)  Financial Resource Strain: Low Risk  (07/13/2022)  Tobacco Use: Low Risk  (11/23/2022)     Readmission Risk Interventions    11/24/2022   10:52 AM  Readmission Risk Prevention Plan  Transportation Screening Complete  Medication Review (RN Care Manager) Complete  PCP or Specialist appointment within 3-5 days of discharge Not Complete  HRI or [place] Complete  SW Recovery Care/Counseling Consult Complete  [place] Not Complete

## 2022-11-26 NOTE — Discharge Summary (Signed)
Physician Discharge Summary   Patient: [name] MRN: [id number] DOB: [birth year]  Admit date:     11/23/2022  Discharge date: 11/26/22  Discharge Physician: Deatra James   PCP: Manon Hilding, MD   Recommendations at discharge:    Follow-up with PCP and cardiologist within 2-4 weeks Continue current medication Continue oral hydratio Note to PCP and cardiology, her current medication of atenolol and Cardizem may be adjusted for better rate control -due to A-fib with RVR, Cardizem was added, recommend to continue Eliquis.   Discharge Diagnoses: Principal Problem:   Sepsis ([place]) Active Problems:   Acute metabolic encephalopathy   Sepsis secondary to UTI ([place])   Acute cystitis without hematuria   Chronic diastolic CHF (congestive heart failure) (HCC)   Paroxysmal atrial fibrillation (HCC)   Dyslipidemia   Type 2 diabetes mellitus (HCC)   Essential hypertension, benign   Anemia of chronic disease   Body mass index 29.0-29.9, adult   Chronic kidney disease, stage III (moderate) (HCC)   Generalized weakness  Resolved Problems:   Chronic atrial fibrillation with RVR [facility])  Hospital Course: [name] is a 89 old female presenting from home chief complaint of being lethargic since yesterday.  Past medical history of P-A-fib (on apixaban),  HTN, HLD,, hyperlipidemia, DM II, ?  Systolic CHF. ..  Patient was recently diagnosed with urine tract infection has been on oral antibiotics.  Family reports patient has becoming more weak, lethargic-sitting in a chair since 4 PM yesterday with poor p.o. intake minimally interactive.   ED course: Upon arrival patient was found lethargic with G CS verbal subscore 5 Blood pressure (!) 77/60, pulse (!) 124, temperature (!) 100.4 F (38 C), temperature source Rectal, resp. rate (!) 31, height 5' (1.524 m), weight 60.8 kg, SpO2 98 %. P as low as 65/45 Lactic acid 5.2, WBC of 14, creatinine 1.22, glucose of 195 UA: Trace of leukocyte  esterase, negative for nitrites, UA WBC 21-50, Chest x-ray:Retrocardiac opacity suspicious for pneumonia   Patient was diagnosed with sepsis,-sepsis pathway was activated, initiated with IV fluid resuscitation, broad-spectrum antibiotics cefepime Blood and urine cultures were obtained Stop the source of sepsis is more urinary to pneumonia  Requested patient to be admitted for sepsis  Assessment and Plan: * Sepsis ([place]) - Hemodynamically stable  -Much improved, resolved sepsis physiology   -Met sepsis criteria on admission:  -SEG:BTDVVO acid 5.2, WBC 14.0, Tmax 100.4, HR 124, RR 31, BP 65/45, Cr. 1.22 -UA: Leukocytosis, WBC 21-50, -Chest x-ray opacity, cannot rule out pneumonia  -S/p a sepsis.  She is we need to be less than initially something you revealed S/P agressive IV fluid resuscitation, broad-spectrum antibiotics cefepime  -Patient will be admitted to stepdown unit-continue with aggressive IV fluid resuscitation, current IV antibiotics with cefepime  -Monitoring BP closely -Will follow-up with cultures of blood and urine   Sepsis secondary to UTI ([place]) -Follow-up cultures continue with current antibiotics cefepime -Cultures negative to date  Acute metabolic encephalopathy -Much improved mentation-mentation back to baseline - Likely due to sepsis  -Treating underlying causes  Chronic diastolic CHF (congestive heart failure) (HCC) Status post aggressive IV fluid resuscitation, no signs of volume overload, -All IV fluids discontinued  Hemodynamically stable -Medical record indicates history of diastolic CHF -11/25/[facility] at [facility], EJ EF 60-65% negative valvular abnormalities, normal systolic function -Unfortunately patient needs aggressive IV fluid resuscitation due to hypotension and sepsis -Monitoring closely I's and O's, daily weight -Once patient stable, if develops symptoms, will  initiate diuretics  Acute cystitis without hematuria - Recent diagnosis  of UTI, failed outpatient treatment -UA once again indicates possible UTI,  -Cultures remain negative to date -Continue IV antibiotics of cefepime>>> switch to p.o. ciprofloxacin for total of 7 days of antibiotic coverage  Paroxysmal atrial fibrillation (HCC) - Tachyarrhythmia A-fib overnight -Resuming home medication of atenolol -Diltiazem switched to diltiazem CD100 20 mg daily -Status post treatment in ICU with amiodarone drip -Resuming home medication of Eliquis   - Continue home medication of apixaban   Dyslipidemia - Withholding home medication simvastatin --Discontinued statin, the risks outweigh the benefits at her age  Type 2 diabetes mellitus ([place]) -Continue home medication of metformin  Generalized weakness -Fall precautions PT OT -Underlying cause  Chronic kidney disease, stage III (moderate) (HCC) - Improved creatinine - Stable history of CKD stage IIIa -Trend BUN/creatinine Lab Results  Component Value Date   CREATININE 0.64 11/25/2022   CREATININE 0.75 11/24/2022   CREATININE 1.22 (H) 11/23/2022   -Doing nephrotoxins  Body mass index 29.0-29.9, adult .Body mass index is 29.28 kg/m. -Continue nutritional intake Once stable will consult nutrition  Anemia of chronic disease -Monitoring hemoglobin closely -stable    Latest Ref Rng & Units 11/25/2022    4:07 AM 11/24/2022    3:36 AM 11/23/2022   10:32 AM  CBC  WBC 4.0 - 10.5 K/uL 5.9  7.9  11.5   Hemoglobin 12.0 - 15.0 g/dL 8.8  9.5  11.5   Hematocrit 36.0 - 46.0 % 27.2  29.6  36.6   Platelets 150 - 400 K/uL 223  251  268       Essential hypertension, benign = Due to hypotension her atenolol was held -Resume home medication of atenolol     Disposition: Home Diet recommendation:  Discharge Diet Orders (From admission, onward)     Start     Ordered   11/26/22 0000  Diet - low sodium heart healthy        11/26/22 1101           Cardiac diet DISCHARGE MEDICATION: Allergies as of  11/26/2022       Reactions   Macrobid [nitrofurantoin] Diarrhea, Nausea And Vomiting   Toprol Xl [metoprolol Succinate] Other (See Comments)   Hair loss        Medication List     STOP taking these medications    cephALEXin 250 MG capsule Commonly known as: KEFLEX   meloxicam 7.5 MG tablet Commonly known as: MOBIC   simvastatin 10 MG tablet Commonly known as: ZOCOR       TAKE these medications    acetaminophen 325 MG tablet Commonly known as: TYLENOL Take 2 tablets (650 mg total) by mouth every 6 (six) hours as needed for mild pain, fever or headache (or Fever >/= 101).   apixaban 2.5 MG Tabs tablet Commonly known as: ELIQUIS Take 1 tablet (2.5 mg total) by mouth 2 (two) times daily.   atenolol 100 MG tablet Commonly known as: TENORMIN Take 1 tablet (100 mg total) by mouth 2 (two) times daily.   ciprofloxacin 500 MG tablet Commonly known as: CIPRO Take 1 tablet (500 mg total) by mouth 2 (two) times daily for 4 days. What changed: additional instructions   diltiazem 120 MG 24 hr capsule Commonly known as: Cardizem CD Take 1 capsule (120 mg total) by mouth daily.   lactobacillus acidophilus & bulgar chewable tablet Chew 1 tablet by mouth 3 (three) times daily with meals for 5 days.  metFORMIN 500 MG tablet Commonly known as: GLUCOPHAGE Take 500-1,000 mg by mouth See admin instructions. 1000 mg in the morning, 500 mg at bedtime.   multivitamin Liqd Take 15 mLs by mouth daily. Start taking on: November 27, 2022 What changed: how much to take        Discharge Exam: Filed Weights   11/24/22 0432 11/25/22 0455 11/26/22 0500  Weight: 66.6 kg 68 kg 67.9 kg        General:  AAO x 3,  cooperative, no distress;   HEENT:  Normocephalic, PERRL, otherwise with in Normal limits   Neuro:  CNII-XII intact. , normal motor and sensation, reflexes intact   Lungs:   Clear to auscultation BL, Respirations unlabored,  No wheezes / crackles  Cardio:    S1/S2, RRR,  No murmure, No Rubs or Gallops   Abdomen:  Soft, non-tender, bowel sounds active all four quadrants, no guarding or peritoneal signs.  Muscular  skeletal:  Limited exam -global generalized weaknesses - in bed, able to move all 4 extremities,   2+ pulses,  symmetric, No pitting edema  Skin:  Dry, warm to touch, negative for any Rashes,  Wounds: Please see nursing documentation          Condition at discharge: good  The results of significant diagnostics from this hospitalization (including imaging, microbiology, ancillary and laboratory) are listed below for reference.   Imaging Studies: DG CHEST PORT 1 VIEW  Result Date: 11/23/2022 CLINICAL DATA:  Central line placement. EXAM: PORTABLE CHEST 1 VIEW COMPARISON:  11/23/2022. FINDINGS: Heart is enlarged and the mediastinal contour stable. There is atherosclerotic calcification of the aorta. The distal tip of a right PICC line terminates at the cavoatrial junction. Lung volumes are low and there is chronic elevation of the right diaphragm. Interstitial prominence is present bilaterally and mild patchy airspace disease is noted at the lung bases. No effusion or pneumothorax. Cholecystectomy clips are noted in the right upper quadrant. No acute osseous abnormality. IMPRESSION: 1. Right PICC line terminates at the cavoatrial junction. 2. Cardiomegaly. 3. Interstitial prominence bilaterally and patchy airspace disease at the lung bases, possible atelectasis, edema or infiltrate. Electronically Signed   By: Brett Fairy M.D.   On: 11/23/2022 20:10   [place] EKG SITE RITE  Result Date: 11/23/2022 If Site Rite image not attached, placement could not be confirmed due to current cardiac rhythm.  CT Abdomen Pelvis W Contrast  Result Date: 11/23/2022 CLINICAL DATA:  Abdominal pain, sepsis. EXAM: CT ABDOMEN AND PELVIS WITH CONTRAST TECHNIQUE: Multidetector CT imaging of the abdomen and pelvis was performed using the standard protocol following bolus  administration of intravenous contrast. RADIATION DOSE REDUCTION: This exam was performed according to the departmental dose-optimization program which includes automated exposure control, adjustment of the mA and/or kV according to patient size and/or use of iterative reconstruction technique. CONTRAST:  60mL OMNIPAQUE IOHEXOL 300 MG/ML  SOLN COMPARISON:  08/18/2022. FINDINGS: Lower chest: Mild dependent atelectasis in the lower lobes. Heart is enlarged. Atherosclerotic calcification of the aorta, aortic valve and coronary arteries. Enlarged pulmonic trunk. No pericardial or pleural effusion. Distal esophagus is grossly unremarkable. Hepatobiliary: Low-attenuation lesions in the liver measure up to 1.7 cm in the left hepatic lobe, similar but too small to characterize. Liver is otherwise unremarkable. Cholecystectomy. No unexpected biliary ductal dilatation. Pancreas: Negative. Spleen: Negative. Adrenals/Urinary Tract: Bilateral adrenal nodularity, unchanged. No specific follow-up necessary. Right kidney is unremarkable. Subcentimeter low-attenuation lesion in the left kidney, too small to characterize. No specific  follow-up necessary. Ureters are mildly dilated, left greater than right. Diffuse bladder wall thickening with perivesical inflammatory haziness. Stomach/Bowel: Tiny hiatal hernia. Duodenal diverticula. Stomach, small bowel, appendix and colon are otherwise unremarkable. Vascular/Lymphatic: Atherosclerotic calcification of the aorta. No pathologically enlarged lymph nodes. Reproductive: Hysterectomy. Simple appearing 1.9 cm left ovarian cyst. No follow-up imaging recommended. Note: This recommendation does not apply to premenarchal patients and to those with increased risk (genetic, family history, elevated tumor markers or other high-risk factors) of ovarian cancer. Reference: JACR 2020 Feb; 17(2):248-254. Other: No free air or free fluid. Mesenteries and peritoneum are unremarkable. Musculoskeletal:  Degenerative changes in the spine. Levoconvex scoliosis. Minimal retrolisthesis of L2 on L3 with minimal grade 1 anterolisthesis L4 on L5, all likely degenerative in etiology. IMPRESSION: 1. Mild bilateral ureteral dilatation with marked bladder wall thickening and perivesical inflammatory haziness, findings suggesting cystitis. 2. Aortic atherosclerosis (ICD10-I70.0). Coronary artery calcification. 3. Enlarged pulmonic trunk, indicative of pulmonary arterial hypertension. Electronically Signed   By: Lorin Picket M.D.   On: 11/23/2022 10:02   DG Chest Port 1 View  Result Date: 11/23/2022 CLINICAL DATA:  Questionable sepsis EXAM: PORTABLE CHEST 1 VIEW COMPARISON:  10/03/2022 FINDINGS: Retrocardiac opacity. Low volume chest. No edema, effusion, or pneumothorax. Normal heart size and mediastinal contours. IMPRESSION: Retrocardiac opacity suspicious for pneumonia Electronically Signed   By: Jorje Guild M.D.   On: 11/23/2022 06:52    Microbiology: Results for orders placed or performed during the hospital encounter of 11/23/22  Resp panel by RT-PCR (RSV, Flu A&B, Covid) Anterior Nasal Swab     Status: None   Collection Time: 11/23/22  6:26 AM   Specimen: Anterior Nasal Swab  Result Value Ref Range Status   SARS Coronavirus 2 by RT PCR NEGATIVE NEGATIVE Final    Comment: (NOTE) SARS-CoV-2 target nucleic acids are NOT DETECTED.  The SARS-CoV-2 RNA is generally detectable in upper respiratory specimens during the acute phase of infection. The lowest concentration of SARS-CoV-2 viral copies this assay can detect is 138 copies/mL. A negative result does not preclude SARS-Cov-2 infection and should not be used as the sole basis for treatment or other patient management decisions. A negative result may occur with  improper specimen collection/handling, submission of specimen other than nasopharyngeal swab, presence of viral mutation(s) within the areas targeted by this assay, and inadequate number  of viral copies(<138 copies/mL). A negative result must be combined with clinical observations, patient history, and epidemiological information. The expected result is Negative.  Fact Sheet for Patients:  EntrepreneurPulse.com.au  Fact Sheet for Healthcare Providers:  IncredibleEmployment.be  This test is no t yet approved or cleared by the [place] FDA and  has been authorized for detection and/or diagnosis of SARS-CoV-2 by FDA under an Emergency Use Authorization (EUA). This EUA will remain  in effect (meaning this test can be used) for the duration of the COVID-19 declaration under Section 564(b)(1) of the Act, 21 U.S.C.section 360bbb-3(b)(1), unless the authorization is terminated  or revoked sooner.       Influenza A by PCR NEGATIVE NEGATIVE Final   Influenza B by PCR NEGATIVE NEGATIVE Final    Comment: (NOTE) The Xpert Xpress SARS-CoV-2/FLU/RSV plus assay is intended as an aid in the diagnosis of influenza from Nasopharyngeal swab specimens and should not be used as a sole basis for treatment. Nasal washings and aspirates are unacceptable for Xpert Xpress SARS-CoV-2/FLU/RSV testing.  Fact Sheet for Patients: EntrepreneurPulse.com.au  Fact Sheet for Healthcare Providers: IncredibleEmployment.be  This test is not  yet approved or cleared by the [place] and has been authorized for detection and/or diagnosis of SARS-CoV-2 by FDA under an Emergency Use Authorization (EUA). This EUA will remain in effect (meaning this test can be used) for the duration of the COVID-19 declaration under Section 564(b)(1) of the Act, 21 U.S.C. section 360bbb-3(b)(1), unless the authorization is terminated or revoked.     Resp Syncytial Virus by PCR NEGATIVE NEGATIVE Final    Comment: (NOTE) Fact Sheet for Patients: EntrepreneurPulse.com.au  Fact Sheet for Healthcare  Providers: IncredibleEmployment.be  This test is not yet approved or cleared by the [place] FDA and has been authorized for detection and/or diagnosis of SARS-CoV-2 by FDA under an Emergency Use Authorization (EUA). This EUA will remain in effect (meaning this test can be used) for the duration of the COVID-19 declaration under Section 564(b)(1) of the Act, 21 U.S.C. section 360bbb-3(b)(1), unless the authorization is terminated or revoked.  Performed at [facility], [address] [place], [place] [address]   Urine Culture     Status: None   Collection Time: 11/23/22  6:26 AM   Specimen: In/Out Cath Urine  Result Value Ref Range Status   Specimen Description   Final    IN/OUT CATH URINE Performed at [facility], [address], [place], [place] [address]    Special Requests   Final    NONE Performed at [facility], [address], [place], [place] [address]    Culture   Final    NO GROWTH Performed at [facility], [place] [address], [place], [place] [address]    Report Status 11/24/2022 FINAL  Final  Blood Culture (routine x 2)     Status: None (Preliminary result)   Collection Time: 11/23/22  6:32 AM   Specimen: BLOOD  Result Value Ref Range Status   Specimen Description BLOOD BLOOD LEFT ARM  Final   Special Requests   Final    BOTTLES DRAWN AEROBIC AND ANAEROBIC Blood Culture results may not be optimal due to an inadequate volume of blood received in culture bottles   Culture   Final    NO GROWTH 3 DAYS Performed at [facility], [address] [place], [place] [address]    Report Status PENDING  Incomplete  Blood Culture (routine x 2)     Status: None (Preliminary result)   Collection Time: 11/23/22  6:42 AM   Specimen: BLOOD  Result Value Ref Range Status   Specimen Description BLOOD BLOOD RIGHT HAND  Final   Special Requests   Final    BOTTLES DRAWN AEROBIC AND ANAEROBIC Blood Culture results may not be optimal due to an inadequate  volume of blood received in culture bottles   Culture   Final    NO GROWTH 3 DAYS Performed at [facility], [address], [place], [place] [address]    Report Status PENDING  Incomplete  MRSA Next Gen by PCR, Nasal     Status: None   Collection Time: 11/23/22  9:57 AM   Specimen: Nasal Mucosa; Nasal Swab  Result Value Ref Range Status   MRSA by PCR Next Gen NOT DETECTED NOT DETECTED Final    Comment: (NOTE) The GeneXpert MRSA Assay (FDA approved for NASAL specimens only), is one component of a comprehensive MRSA colonization surveillance program. It is not intended to diagnose MRSA infection nor to guide or monitor treatment for MRSA infections. Test performance is not FDA approved in patients less than 37 years old.  Performed at [facility], [address], [place], [place] [address]     Labs: CBC: Recent Labs  Lab 11/23/22 332 285 3030 11/23/22 1032 11/24/22 0336 11/25/22 0407 11/26/22 0335  WBC 14.0* 11.5* 7.9 5.9 8.4  NEUTROABS 13.5*  --   --   --   --   HGB 11.6* 11.5* 9.5* 8.8* 10.3*  HCT 36.2 36.6 29.6* 27.2* 31.8*  MCV 92.8 95.1 91.9 91.6 92.2  PLT 313 268 251 223 035   Basic Metabolic Panel: Recent Labs  Lab 11/23/22 0644 11/23/22 0837 11/24/22 0336 11/25/22 0407 11/26/22 0335  NA 131*  --  133* 133* 130*  K 4.4  --  3.8 3.5 4.4  CL 98  --  102 104 103  CO2 19*  --  22 20* 17*  GLUCOSE 195*  --  131* 148* 142*  BUN 35*  --  36* 26* 24*  CREATININE 1.22*  --  0.75 0.64 0.67  CALCIUM 8.9  --  8.3* 8.3* 8.6*  MG  --  1.0*  --  1.2*  --   PHOS  --  3.6  --   --   --    Liver Function Tests: Recent Labs  Lab 11/23/22 0644  AST 320*  ALT 157*  ALKPHOS 54  BILITOT 1.5*  PROT 6.9  ALBUMIN 3.3*   CBG: Recent Labs  Lab 11/25/22 0744 11/25/22 1110 11/25/22 1607 11/25/22 2145 11/26/22 0823  GLUCAP 149* 170* 144* 147* 146*    Discharge time spent: greater than 40 minutes.  Signed: Deatra James, MD [facility] 11/26/2022

## 2022-11-26 NOTE — Evaluation (Signed)
Occupational Therapy Evaluation Patient Details Name: [name] MRN: [id number] DOB: 08/22/1927 Today's Date: 11/26/2022   History of Present Illness 89 y.o. F admitted on 11/23/22 due to increased lethargy and weakness. Pt undergoing Sepsis work-up. PMH significant for A-fib, HTN, HLD, DM2, CHF.   Clinical Impression   Pt admitted for concerns listed above. PTA pt was needing assist from her family for most ADL's, however recently she has been needing increased assist from her typical baseline. At this time, she is requiring mod-max A for all ADL's, max A for bed mobility, and mod A for step transfers. OT providing multiple verbal cuing for step by step command following due to pt's difficulty following directions. Recommending SNF level therapies at this time and OT will follow acutely.       Recommendations for follow up therapy are one component of a multi-disciplinary discharge planning process, led by the attending physician.  Recommendations may be updated based on patient status, additional functional criteria and insurance authorization.   Follow Up Recommendations  Skilled nursing-short term rehab (<3 hours/day)     Assistance Recommended at Discharge Frequent or constant Supervision/Assistance  Patient can return home with the following A lot of help with walking and/or transfers;A lot of help with bathing/dressing/bathroom;Assistance with cooking/housework;Direct supervision/assist for medications management;Help with stairs or ramp for entrance    Functional Status Assessment  Patient has had a recent decline in their functional status and demonstrates the ability to make significant improvements in function in a reasonable and predictable amount of time.  Equipment Recommendations  None recommended by OT    Recommendations for Other Services       Precautions / Restrictions Precautions Precautions: Fall Restrictions Weight Bearing Restrictions: No      Mobility Bed  Mobility Overal bed mobility: Needs Assistance Bed Mobility: Supine to Sit     Supine to sit: Max assist     General bed mobility comments: Pt able to move her legs towards EOB, however required max assist posteriorly and pulling to sitting up at EOB    Transfers Overall transfer level: Needs assistance Equipment used: Rolling walker (2 wheels) Transfers: Sit to/from Stand, Bed to chair/wheelchair/BSC Sit to Stand: Mod assist     Step pivot transfers: Mod assist     General transfer comment: Increased time, multiple verbal cues and encouragement, assist to power up and steady      Balance Overall balance assessment: Needs assistance Sitting-balance support: Single extremity supported, Feet supported Sitting balance-Leahy Scale: Poor Sitting balance - Comments: Posterior lean in sitting Postural control: Posterior lean Standing balance support: Bilateral upper extremity supported Standing balance-Leahy Scale: Poor Standing balance comment: heavy use of RW                           ADL either performed or assessed with clinical judgement   ADL Overall ADL's : Needs assistance/impaired Eating/Feeding: Set up;Sitting   Grooming: Minimal assistance;Sitting   Upper Body Bathing: Moderate assistance;Sitting   Lower Body Bathing: Maximal assistance;Sitting/lateral leans   Upper Body Dressing : Moderate assistance;Sitting   Lower Body Dressing: Maximal assistance;Sitting/lateral leans   Toilet Transfer: Moderate assistance;Stand-pivot   Toileting- Clothing Manipulation and Hygiene: Maximal assistance;Sitting/lateral lean       Functional mobility during ADLs: Moderate assistance;Rolling walker (2 wheels) General ADL Comments: Pt requiring increased time and assist for standing and transfers, she has poor sitting balance and requires mod-max assist with all ADL'.s  Vision Baseline Vision/History: 1 Wears glasses Ability to See in Adequate Light: 0  Adequate Patient Visual Report: No change from baseline Vision Assessment?: No apparent visual deficits     Perception Perception Perception Tested?: No   Praxis Praxis Praxis tested?: Not tested    Pertinent Vitals/Pain Pain Assessment Pain Assessment: Faces Faces Pain Scale: Hurts even more Pain Location: both hips with standing Pain Descriptors / Indicators: Aching, Discomfort, Grimacing Pain Intervention(s): Monitored during session, Repositioned     Hand Dominance Right   Extremity/Trunk Assessment Upper Extremity Assessment Upper Extremity Assessment: Generalized weakness   Lower Extremity Assessment Lower Extremity Assessment: Defer to PT evaluation   Cervical / Trunk Assessment Cervical / Trunk Assessment: Kyphotic   Communication Communication Communication: HOH   Cognition Arousal/Alertness: Awake/alert Behavior During Therapy: WFL for tasks assessed/performed, Flat affect Overall Cognitive Status: No family/caregiver present to determine baseline cognitive functioning                                 General Comments: Pt having difficulty following 2+ step commands, requiring increased time and repetition     General Comments  VSS on RA, O2 monitorred and remaining above 93% for entirety of session    Exercises     Shoulder Instructions      Home Living Family/patient expects to be discharged to:: Skilled nursing facility                                 Additional Comments: Pt lived with daughter prior to admission      Prior Functioning/Environment Prior Level of Function : Needs assist             Mobility Comments: household ambulator using RW ADLs Comments: assisted by family        OT Problem List: Decreased strength;Decreased activity tolerance;Decreased range of motion;Impaired balance (sitting and/or standing);Decreased cognition;Decreased safety awareness;Decreased knowledge of use of DME or  AE;Impaired UE functional use;Pain      OT Treatment/Interventions: Self-care/ADL training;Therapeutic exercise;DME and/or AE instruction;Energy conservation;Therapeutic activities;Patient/family education;Balance training    OT Goals(Current goals can be found in the care plan section) Acute Rehab OT Goals Patient Stated Goal: None stated OT Goal Formulation: With patient Time For Goal Achievement: 12/10/22 Potential to Achieve Goals: Good ADL Goals Pt Will Perform Grooming: with modified independence;sitting Pt Will Perform Lower Body Bathing: with mod assist;sitting/lateral leans;sit to/from stand Pt Will Perform Lower Body Dressing: with mod assist;sitting/lateral leans;sit to/from stand Pt Will Transfer to Toilet: with min assist;stand pivot transfer Pt Will Perform Toileting - Clothing Manipulation and hygiene: with min assist;sitting/lateral leans;sit to/from stand  OT Frequency: Min 2X/week    Co-evaluation              AM-PAC OT "6 Clicks" Daily Activity     Outcome Measure Help from another person eating meals?: A Little Help from another person taking care of personal grooming?: A Little Help from another person toileting, which includes using toliet, bedpan, or urinal?: A Lot Help from another person bathing (including washing, rinsing, drying)?: A Lot Help from another person to put on and taking off regular upper body clothing?: A Lot Help from another person to put on and taking off regular lower body clothing?: A Lot 6 Click Score: 14   End of Session Equipment Utilized During Treatment: Gait belt;Rolling walker (2 wheels) Nurse  Communication: Mobility status  Activity Tolerance: Patient tolerated treatment well Patient left: in chair;with call bell/phone within reach;with chair alarm set  OT Visit Diagnosis: Unsteadiness on feet (R26.81);Other abnormalities of gait and mobility (R26.89);Muscle weakness (generalized) (M62.81)                Time:  0370-9643 OT Time Calculation (min): 17 min Charges:  OT General Charges $OT Visit: 1 Visit OT Evaluation $OT Eval Moderate Complexity: [place], OTR/L [facility] Acute Rehab  Zayyan Mullen Elane Yolanda Bonine 11/26/2022, 10:35 AM

## 2022-11-28 LAB — CULTURE, BLOOD (ROUTINE X 2)
Culture: NO GROWTH
Culture: NO GROWTH

## 2022-11-30 ENCOUNTER — Telehealth: Payer: Self-pay | Admitting: Cardiology

## 2022-11-30 DIAGNOSIS — I480 Paroxysmal atrial fibrillation: Secondary | ICD-10-CM | POA: Diagnosis not present

## 2022-11-30 DIAGNOSIS — N1831 Chronic kidney disease, stage 3a: Secondary | ICD-10-CM | POA: Diagnosis not present

## 2022-11-30 DIAGNOSIS — Z7984 Long term (current) use of oral hypoglycemic drugs: Secondary | ICD-10-CM | POA: Diagnosis not present

## 2022-11-30 DIAGNOSIS — I700 Atherosclerosis of aorta: Secondary | ICD-10-CM | POA: Diagnosis not present

## 2022-11-30 DIAGNOSIS — Z87440 Personal history of urinary (tract) infections: Secondary | ICD-10-CM | POA: Diagnosis not present

## 2022-11-30 DIAGNOSIS — Z96659 Presence of unspecified artificial knee joint: Secondary | ICD-10-CM | POA: Diagnosis not present

## 2022-11-30 DIAGNOSIS — Z9181 History of falling: Secondary | ICD-10-CM | POA: Diagnosis not present

## 2022-11-30 DIAGNOSIS — N3000 Acute cystitis without hematuria: Secondary | ICD-10-CM | POA: Diagnosis not present

## 2022-11-30 DIAGNOSIS — Z7901 Long term (current) use of anticoagulants: Secondary | ICD-10-CM | POA: Diagnosis not present

## 2022-11-30 DIAGNOSIS — I4719 Other supraventricular tachycardia: Secondary | ICD-10-CM | POA: Diagnosis not present

## 2022-11-30 DIAGNOSIS — I130 Hypertensive heart and chronic kidney disease with heart failure and stage 1 through stage 4 chronic kidney disease, or unspecified chronic kidney disease: Secondary | ICD-10-CM | POA: Diagnosis not present

## 2022-11-30 DIAGNOSIS — Z8673 Personal history of transient ischemic attack (TIA), and cerebral infarction without residual deficits: Secondary | ICD-10-CM | POA: Diagnosis not present

## 2022-11-30 DIAGNOSIS — E1122 Type 2 diabetes mellitus with diabetic chronic kidney disease: Secondary | ICD-10-CM | POA: Diagnosis not present

## 2022-11-30 DIAGNOSIS — D631 Anemia in chronic kidney disease: Secondary | ICD-10-CM | POA: Diagnosis not present

## 2022-11-30 DIAGNOSIS — I2510 Atherosclerotic heart disease of native coronary artery without angina pectoris: Secondary | ICD-10-CM | POA: Diagnosis not present

## 2022-11-30 DIAGNOSIS — D72829 Elevated white blood cell count, unspecified: Secondary | ICD-10-CM | POA: Diagnosis not present

## 2022-11-30 DIAGNOSIS — G9341 Metabolic encephalopathy: Secondary | ICD-10-CM | POA: Diagnosis not present

## 2022-11-30 DIAGNOSIS — F0390 Unspecified dementia without behavioral disturbance: Secondary | ICD-10-CM | POA: Diagnosis not present

## 2022-11-30 DIAGNOSIS — I5032 Chronic diastolic (congestive) heart failure: Secondary | ICD-10-CM | POA: Diagnosis not present

## 2022-11-30 DIAGNOSIS — E785 Hyperlipidemia, unspecified: Secondary | ICD-10-CM | POA: Diagnosis not present

## 2022-11-30 NOTE — Telephone Encounter (Signed)
Pt c/o medication issue:  1. Name of Medication: atenolol (TENORMIN) 100 MG tablet   2. How are you currently taking this medication (dosage and times per day)? ake 1 tablet (100 mg total) by mouth 2 (two) times daily.   3. Are you having a reaction (difficulty breathing--STAT)? no  4. What is your medication issue? Daughter calling to make sure that dr is aware of the dosage change from 50mg  to 100mg  that was made at the hospital. Wanted to make sure that the dr is okay with that. Also wanted to make him aware that the patient is taking this medication as well diltiazem (CARDIZEM CD) 120 MG 24 hr capsule . Please advise

## 2022-11-30 NOTE — Telephone Encounter (Signed)
Will forward to Dr. McDowell 

## 2022-12-03 MED ORDER — DILTIAZEM HCL ER COATED BEADS 120 MG PO CP24: 120.0000 mg | ORAL_CAPSULE | Freq: Every day | ORAL | 11 refills | Status: DC

## 2022-12-03 MED ORDER — ATENOLOL 50 MG PO TABS: 50.0000 mg | ORAL_TABLET | Freq: Two times a day (BID) | ORAL | 3 refills | Status: DC

## 2022-12-03 NOTE — Telephone Encounter (Signed)
Spoke with daughter and notified of Dr. Domenic Polite response. Orders placed for Atenolol and Diltiazem.

## 2022-12-03 NOTE — Telephone Encounter (Signed)
Pts daughter states that she was in the hospital on Nov 2 her Atenolol was increased to 100 mg BID. Daughter states that she was unaware of the change at that time and continued to give her the 50 mg BID. Daughter's states that she noticed the increased when she went to refill her pill bottle at the end of Nov. Pt was hospitalized last week she was given the 100 mg BID in the hospital. [facility] noticed the dose change too and advised not to increased until cleared with Dr. Domenic Polite. Pt is also taking Diltiazem. Please advise.

## 2022-12-03 NOTE — Telephone Encounter (Signed)
Friday 101/80, Sat 137/90 Hr 88. Sun 114/97 88, today 147/90 Hr 87.

## 2022-12-05 ENCOUNTER — Inpatient Hospital Stay (HOSPITAL_COMMUNITY)
Admission: EM | Admit: 2022-12-05 | Discharge: 2022-12-07 | DRG: 871 | Disposition: A | Discharge status: Home Health | Payer: Medicare Other | Attending: Family Medicine | Admitting: Family Medicine

## 2022-12-05 ENCOUNTER — Emergency Department (HOSPITAL_COMMUNITY): Payer: Medicare Other

## 2022-12-05 ENCOUNTER — Encounter (HOSPITAL_COMMUNITY): Payer: Self-pay | Admitting: Emergency Medicine

## 2022-12-05 ENCOUNTER — Other Ambulatory Visit: Payer: Self-pay

## 2022-12-05 DIAGNOSIS — Z888 Allergy status to other drugs, medicaments and biological substances status: Secondary | ICD-10-CM

## 2022-12-05 DIAGNOSIS — G9341 Metabolic encephalopathy: Secondary | ICD-10-CM | POA: Diagnosis present

## 2022-12-05 DIAGNOSIS — R410 Disorientation, unspecified: Secondary | ICD-10-CM | POA: Diagnosis not present

## 2022-12-05 DIAGNOSIS — N10 Acute tubulo-interstitial nephritis: Secondary | ICD-10-CM | POA: Diagnosis not present

## 2022-12-05 DIAGNOSIS — I959 Hypotension, unspecified: Secondary | ICD-10-CM | POA: Diagnosis not present

## 2022-12-05 DIAGNOSIS — N134 Hydroureter: Secondary | ICD-10-CM | POA: Diagnosis not present

## 2022-12-05 DIAGNOSIS — N136 Pyonephrosis: Secondary | ICD-10-CM | POA: Diagnosis present

## 2022-12-05 DIAGNOSIS — R7401 Elevation of levels of liver transaminase levels: Secondary | ICD-10-CM | POA: Insufficient documentation

## 2022-12-05 DIAGNOSIS — N3289 Other specified disorders of bladder: Secondary | ICD-10-CM | POA: Diagnosis not present

## 2022-12-05 DIAGNOSIS — N390 Urinary tract infection, site not specified: Secondary | ICD-10-CM | POA: Diagnosis not present

## 2022-12-05 DIAGNOSIS — E119 Type 2 diabetes mellitus without complications: Secondary | ICD-10-CM | POA: Diagnosis present

## 2022-12-05 DIAGNOSIS — A419 Sepsis, unspecified organism: Principal | ICD-10-CM | POA: Diagnosis present

## 2022-12-05 DIAGNOSIS — Z8249 Family history of ischemic heart disease and other diseases of the circulatory system: Secondary | ICD-10-CM

## 2022-12-05 DIAGNOSIS — G9340 Encephalopathy, unspecified: Secondary | ICD-10-CM | POA: Diagnosis not present

## 2022-12-05 DIAGNOSIS — E876 Hypokalemia: Secondary | ICD-10-CM | POA: Diagnosis not present

## 2022-12-05 DIAGNOSIS — R0689 Other abnormalities of breathing: Secondary | ICD-10-CM | POA: Diagnosis not present

## 2022-12-05 DIAGNOSIS — Z96659 Presence of unspecified artificial knee joint: Secondary | ICD-10-CM | POA: Diagnosis present

## 2022-12-05 DIAGNOSIS — Z66 Do not resuscitate: Secondary | ICD-10-CM | POA: Diagnosis present

## 2022-12-05 DIAGNOSIS — I4719 Other supraventricular tachycardia: Secondary | ICD-10-CM | POA: Diagnosis present

## 2022-12-05 DIAGNOSIS — F0390 Unspecified dementia without behavioral disturbance: Secondary | ICD-10-CM | POA: Diagnosis present

## 2022-12-05 DIAGNOSIS — I4821 Permanent atrial fibrillation: Secondary | ICD-10-CM | POA: Diagnosis present

## 2022-12-05 DIAGNOSIS — Z8673 Personal history of transient ischemic attack (TIA), and cerebral infarction without residual deficits: Secondary | ICD-10-CM | POA: Diagnosis not present

## 2022-12-05 DIAGNOSIS — I2510 Atherosclerotic heart disease of native coronary artery without angina pectoris: Secondary | ICD-10-CM | POA: Diagnosis present

## 2022-12-05 DIAGNOSIS — E8809 Other disorders of plasma-protein metabolism, not elsewhere classified: Secondary | ICD-10-CM | POA: Insufficient documentation

## 2022-12-05 DIAGNOSIS — R6520 Severe sepsis without septic shock: Secondary | ICD-10-CM | POA: Diagnosis present

## 2022-12-05 DIAGNOSIS — R000 Tachycardia, unspecified: Secondary | ICD-10-CM | POA: Diagnosis not present

## 2022-12-05 DIAGNOSIS — I10 Essential (primary) hypertension: Secondary | ICD-10-CM | POA: Diagnosis not present

## 2022-12-05 DIAGNOSIS — I4891 Unspecified atrial fibrillation: Secondary | ICD-10-CM | POA: Diagnosis not present

## 2022-12-05 DIAGNOSIS — E785 Hyperlipidemia, unspecified: Secondary | ICD-10-CM | POA: Diagnosis present

## 2022-12-05 DIAGNOSIS — Z8616 Personal history of COVID-19: Secondary | ICD-10-CM | POA: Diagnosis not present

## 2022-12-05 DIAGNOSIS — Z7984 Long term (current) use of oral hypoglycemic drugs: Secondary | ICD-10-CM | POA: Diagnosis not present

## 2022-12-05 DIAGNOSIS — R591 Generalized enlarged lymph nodes: Secondary | ICD-10-CM | POA: Diagnosis not present

## 2022-12-05 DIAGNOSIS — N1330 Unspecified hydronephrosis: Secondary | ICD-10-CM | POA: Diagnosis not present

## 2022-12-05 DIAGNOSIS — R079 Chest pain, unspecified: Secondary | ICD-10-CM | POA: Diagnosis not present

## 2022-12-05 DIAGNOSIS — Z7901 Long term (current) use of anticoagulants: Secondary | ICD-10-CM | POA: Diagnosis not present

## 2022-12-05 DIAGNOSIS — Z23 Encounter for immunization: Secondary | ICD-10-CM | POA: Diagnosis not present

## 2022-12-05 DIAGNOSIS — R5381 Other malaise: Secondary | ICD-10-CM | POA: Diagnosis not present

## 2022-12-05 LAB — CBC WITH DIFFERENTIAL/PLATELET
Abs Immature Granulocytes: 0.05 10*3/uL (ref 0.00–0.07)
Basophils Absolute: 0 10*3/uL (ref 0.0–0.1)
Basophils Relative: 0 %
Eosinophils Absolute: 0 10*3/uL (ref 0.0–0.5)
Eosinophils Relative: 0 %
HCT: 33 % — ABNORMAL LOW (ref 36.0–46.0)
Hemoglobin: 10.6 g/dL — ABNORMAL LOW (ref 12.0–15.0)
Immature Granulocytes: 1 %
Lymphocytes Relative: 1 %
Lymphs Abs: 0.1 10*3/uL — ABNORMAL LOW (ref 0.7–4.0)
MCH: 29.7 pg (ref 26.0–34.0)
MCHC: 32.1 g/dL (ref 30.0–36.0)
MCV: 92.4 fL (ref 80.0–100.0)
Monocytes Absolute: 0.1 10*3/uL (ref 0.1–1.0)
Monocytes Relative: 1 %
Neutro Abs: 10.4 10*3/uL — ABNORMAL HIGH (ref 1.7–7.7)
Neutrophils Relative %: 97 %
Platelets: 404 10*3/uL — ABNORMAL HIGH (ref 150–400)
RBC: 3.57 MIL/uL — ABNORMAL LOW (ref 3.87–5.11)
RDW: 15.9 % — ABNORMAL HIGH (ref 11.5–15.5)
WBC: 10.7 10*3/uL — ABNORMAL HIGH (ref 4.0–10.5)
nRBC: 0 % (ref 0.0–0.2)

## 2022-12-05 LAB — RESP PANEL BY RT-PCR (RSV, FLU A&B, COVID)  RVPGX2
Influenza A by PCR: NEGATIVE
Influenza B by PCR: NEGATIVE
Resp Syncytial Virus by PCR: NEGATIVE
SARS Coronavirus 2 by RT PCR: NEGATIVE

## 2022-12-05 LAB — COMPREHENSIVE METABOLIC PANEL
ALT: 53 U/L — ABNORMAL HIGH (ref 0–44)
AST: 182 U/L — ABNORMAL HIGH (ref 15–41)
Albumin: 3 g/dL — ABNORMAL LOW (ref 3.5–5.0)
Alkaline Phosphatase: 68 U/L (ref 38–126)
Anion gap: 14 (ref 5–15)
BUN: 20 mg/dL (ref 8–23)
CO2: 20 mmol/L — ABNORMAL LOW (ref 22–32)
Calcium: 8.7 mg/dL — ABNORMAL LOW (ref 8.9–10.3)
Chloride: 99 mmol/L (ref 98–111)
Creatinine, Ser: 0.72 mg/dL (ref 0.44–1.00)
GFR, Estimated: >60 mL/min (ref >60)
Glucose, Bld: 95 mg/dL (ref 70–99)
Potassium: 3.3 mmol/L — ABNORMAL LOW (ref 3.5–5.1)
Sodium: 133 mmol/L — ABNORMAL LOW (ref 135–145)
Total Bilirubin: 1.5 mg/dL — ABNORMAL HIGH (ref 0.3–1.2)
Total Protein: 6.1 g/dL — ABNORMAL LOW (ref 6.5–8.1)

## 2022-12-05 LAB — URINALYSIS, ROUTINE W REFLEX MICROSCOPIC
Bilirubin Urine: NEGATIVE
Glucose, UA: NEGATIVE mg/dL
Hgb urine dipstick: NEGATIVE
Ketones, ur: 5 mg/dL — AB
Nitrite: NEGATIVE
Protein, ur: NEGATIVE mg/dL
Specific Gravity, Urine: 1.01 (ref 1.005–1.030)
WBC, UA: >50 WBC/hpf — ABNORMAL HIGH (ref 0–5)
pH: 6 (ref 5.0–8.0)

## 2022-12-05 LAB — LIPASE, BLOOD: Lipase: 161 U/L — ABNORMAL HIGH (ref 11–51)

## 2022-12-05 LAB — MAGNESIUM: Magnesium: 1.2 mg/dL — ABNORMAL LOW (ref 1.7–2.4)

## 2022-12-05 LAB — TROPONIN I (HIGH SENSITIVITY)
Troponin I (High Sensitivity): 6 ng/L (ref <18)
Troponin I (High Sensitivity): 6 ng/L (ref <18)

## 2022-12-05 MED ORDER — SODIUM CHLORIDE 0.9 % IV SOLN
1.0000 g | Freq: Once | INTRAVENOUS | Status: AC
  Administered 2022-12-05: 1 g via INTRAVENOUS
  Filled 2022-12-05: qty 10

## 2022-12-05 MED ORDER — LABETALOL HCL 5 MG/ML IV SOLN
10.0000 mg | Freq: Once | INTRAVENOUS | Status: AC
  Administered 2022-12-05: 10 mg via INTRAVENOUS
  Filled 2022-12-05: qty 4

## 2022-12-05 MED ORDER — ATENOLOL 25 MG PO TABS
50.0000 mg | ORAL_TABLET | Freq: Two times a day (BID) | ORAL | Status: DC
  Administered 2022-12-06 – 2022-12-07 (×3): 50 mg via ORAL
  Filled 2022-12-05 (×3): qty 2

## 2022-12-05 MED ORDER — LABETALOL HCL 5 MG/ML IV SOLN
5.0000 mg | Freq: Once | INTRAVENOUS | Status: DC
  Filled 2022-12-05: qty 4

## 2022-12-05 MED ORDER — IOHEXOL 300 MG/ML  SOLN
80.0000 mL | Freq: Once | INTRAMUSCULAR | Status: AC | PRN
  Administered 2022-12-05: 80 mL via INTRAVENOUS

## 2022-12-05 MED ORDER — SODIUM CHLORIDE 0.9 % IV BOLUS
500.0000 mL | Freq: Once | INTRAVENOUS | Status: AC
  Administered 2022-12-05: 500 mL via INTRAVENOUS

## 2022-12-05 MED ORDER — ACETAMINOPHEN 325 MG PO TABS
650.0000 mg | ORAL_TABLET | Freq: Four times a day (QID) | ORAL | Status: DC | PRN
  Administered 2022-12-06: 650 mg via ORAL
  Filled 2022-12-05: qty 2

## 2022-12-05 MED ORDER — MORPHINE SULFATE (PF) 2 MG/ML IV SOLN: 2.0000 mg | INTRAVENOUS | Status: DC | PRN

## 2022-12-05 MED ORDER — SODIUM CHLORIDE 0.9 % IV BOLUS
1000.0000 mL | Freq: Once | INTRAVENOUS | Status: AC
  Administered 2022-12-05: 1000 mL via INTRAVENOUS

## 2022-12-05 MED ORDER — ATENOLOL 25 MG PO TABS
50.0000 mg | ORAL_TABLET | Freq: Once | ORAL | Status: DC
  Filled 2022-12-05: qty 2

## 2022-12-05 MED ORDER — OXYCODONE HCL 5 MG PO TABS: 5.0000 mg | ORAL_TABLET | ORAL | Status: DC | PRN

## 2022-12-05 MED ORDER — ACETAMINOPHEN 650 MG RE SUPP: 650.0000 mg | Freq: Four times a day (QID) | RECTAL | Status: DC | PRN

## 2022-12-05 MED ORDER — ONDANSETRON HCL 4 MG PO TABS: 4.0000 mg | ORAL_TABLET | Freq: Four times a day (QID) | ORAL | Status: DC | PRN

## 2022-12-05 MED ORDER — DILTIAZEM HCL ER COATED BEADS 120 MG PO CP24
120.0000 mg | ORAL_CAPSULE | Freq: Every day | ORAL | Status: DC
  Administered 2022-12-06 – 2022-12-07 (×2): 120 mg via ORAL
  Filled 2022-12-05 (×2): qty 1

## 2022-12-05 MED ORDER — APIXABAN 2.5 MG PO TABS
2.5000 mg | ORAL_TABLET | Freq: Two times a day (BID) | ORAL | Status: DC
  Administered 2022-12-06 – 2022-12-07 (×3): 2.5 mg via ORAL
  Filled 2022-12-05 (×3): qty 1

## 2022-12-05 MED ORDER — POTASSIUM CHLORIDE 10 MEQ/100ML IV SOLN
10.0000 meq | INTRAVENOUS | Status: DC
  Filled 2022-12-05: qty 100

## 2022-12-05 MED ORDER — MAGNESIUM SULFATE 2 GM/50ML IV SOLN
2.0000 g | Freq: Once | INTRAVENOUS | Status: DC
  Filled 2022-12-05: qty 50

## 2022-12-05 MED ORDER — ONDANSETRON HCL 4 MG/2ML IJ SOLN
4.0000 mg | Freq: Four times a day (QID) | INTRAMUSCULAR | Status: DC | PRN
  Administered 2022-12-06: 4 mg via INTRAVENOUS
  Filled 2022-12-05: qty 2

## 2022-12-05 MED ORDER — CIPROFLOXACIN IN D5W 400 MG/200ML IV SOLN
200.0000 mg | Freq: Once | INTRAVENOUS | Status: AC
  Administered 2022-12-05: 200 mg via INTRAVENOUS
  Filled 2022-12-05: qty 200

## 2022-12-05 NOTE — ED Notes (Signed)
Attempted to call inpatient unit as status stats pt has an inpatient bed "ready" to [facility], however was informed by unit secretary, bed has not been assigned to pt nor does pt have a "ready bed", will need to call back to check if status has changed.

## 2022-12-05 NOTE — ED Notes (Signed)
RN [place] Mast D to attempt for IV access along with ultrasound guided, 2 failed attempts as well.

## 2022-12-05 NOTE — ED Provider Notes (Signed)
[facility] Provider Note  CSN: [id number] Arrival date & time: 12/05/22 1621  Chief Complaint(s) Weakness  HPI [name] is a 89 y.o. female with past medical history as below, significant for afib on eliquis/atenolol/cardiazem, HLD, T2DM, TIA who presents to the ED with complaint of ams, n/v, concern for UTI. Multiple admissions for recurrent UTI, she was started on ppx macrobid and took first dose today, shortly after began to have nausea/vomiting, agitation, similar to prior response in the past. This began shortly after taking the medication. Reports pain to suprapubic area. Pt with agitation, confusion since this am after taking medication, similar to prior UTI and when she has been on macrobid in the past. Pt also with elevated HR, she was recently reduced from atenolol 100mg  to 50mg . Follows w/ Dr Domenic Polite cardiology.   She was recently discharged 11/20 with sepsis 2/2 uti, also 1/8 with similar. \  Level 5 caveat ams  Past Medical History Past Medical History:  Diagnosis Date   Atrial fibrillation ([place])    Coronary atherosclerosis of native coronary artery    Nonobstructive   Dyslipidemia    PSVT (paroxysmal supraventricular tachycardia)    TIA (transient ischemic attack)    Type 2 diabetes mellitus ([place])    Diet controlled   Patient Active Problem List   Diagnosis Date Noted   Atrial fibrillation with RVR ([place]) 12/05/2022   Sepsis ([place]) 11/23/2022   Fall at home, initial encounter 10/04/2022   Hypoalbuminemia due to protein-calorie malnutrition ([place]) 10/04/2022   Sepsis secondary to UTI ([place]) 10/03/2022   Chronic diastolic CHF (congestive heart failure) ([place]) 09/24/2022   COVID-19 virus infection 09/20/2022   Diverticulitis of colon    Pressure injury of skin 08/18/2022   Dyslipidemia    Type 2 diabetes mellitus ([place])    Anemia of chronic disease 05/03/2022   B12 deficiency 05/03/2022   Paroxysmal atrial fibrillation ([place]) 68/[birth year]1962    Acute metabolic encephalopathy 22/97/9892   Acute cystitis without hematuria 12/01/2021   Current use of long term anticoagulation 08/15/2021   Generalized weakness 08/15/2021   Body mass index 29.0-29.9, adult 07/12/2017   Low back pain 07/12/2017   Proteinuria 11/21/2016   Chronic kidney disease, stage III (moderate) ([place]) 11/19/2016   Vascular insufficiency of intestine ([place]) 06/23/2014   Gouty arthropathy 06/11/2014   Osteoarthrosis 07/24/2013   Coronary atherosclerosis of native coronary artery 10/13/2012   Essential hypertension, benign 09/12/2011   Home Medication(s) Prior to Admission medications   Medication Sig Start Date End Date Taking? Authorizing Provider  acetaminophen (TYLENOL) 325 MG tablet Take 2 tablets (650 mg total) by mouth every 6 (six) hours as needed for mild pain, fever or headache (or Fever >/= 101). 10/08/22  Yes Emokpae, Courage, MD  acidophilus (RISAQUAD) CAPS capsule Take 1 capsule by mouth daily.   Yes [provider]  apixaban (ELIQUIS) 2.5 MG TABS tablet Take 1 tablet (2.5 mg total) by mouth 2 (two) times daily. 11/26/22 12/26/22 Yes Shahmehdi, Valeria Batman, MD  atenolol (TENORMIN) 50 MG tablet Take 1 tablet (50 mg total) by mouth 2 (two) times daily. 12/03/22 11/28/23 Yes Satira Sark, MD  D-MANNOSE PO Take 1 capsule by mouth with breakfast, with lunch, and with evening meal.   Yes [provider]  diltiazem (CARDIZEM CD) 120 MG 24 hr capsule Take 1 capsule (120 mg total) by mouth daily. 12/03/22 11/28/23 Yes Satira Sark, MD  metFORMIN (GLUCOPHAGE) 500 MG tablet Take 500-1,000 mg by mouth  See admin instructions. 1000 mg in the morning, 500 mg at bedtime.   Yes [provider]  Multiple Vitamin (MULTIVITAMIN) LIQD Take 15 mLs by mouth daily. 11/27/22  Yes Shahmehdi, Seyed A, MD  nitrofurantoin (MACRODANTIN) 50 MG capsule Take 50 mg by mouth daily. 12/04/22  Yes [provider]                                                                                                                                     Past Surgical History Past Surgical History:  Procedure Laterality Date   ABDOMINAL HYSTERECTOMY     CARPAL TUNNEL RELEASE     x's 2   CHOLECYSTECTOMY     REPLACEMENT TOTAL KNEE  2002 & 2012   SHOULDER SURGERY  2001   Family History Family History  Problem Relation Age of Onset   Coronary artery disease Mother     Social History Social History   Tobacco Use   Smoking status: Never   Smokeless tobacco: Never  Vaping Use   Vaping Use: Never used  Substance Use Topics   Alcohol use: No    Alcohol/week: 0.0 standard drinks of alcohol   Drug use: No   Allergies Macrobid [nitrofurantoin] and Toprol xl [metoprolol succinate]  Review of Systems Review of Systems  Unable to perform ROS: Mental status change  Constitutional:  Negative for chills and fever.  Gastrointestinal:  Positive for abdominal pain, nausea and vomiting.  Psychiatric/Behavioral:  Positive for behavioral problems and confusion.     Physical Exam Vital Signs  I have reviewed the triage vital signs BP 118/64 (BP Location: Left Arm)   Pulse (!) 109   Temp 99 F (37.2 C) (Oral)   Resp 19   Ht 5' (1.524 m)   Wt 67.9 kg   SpO2 94%   BMI 29.23 kg/m  Physical Exam Vitals and nursing note reviewed.  Constitutional:      General: She is not in acute distress.    Appearance: Normal appearance. She is not ill-appearing.  HENT:     Head: Normocephalic and atraumatic.     Right Ear: External ear normal.     Left Ear: External ear normal.     Nose: Nose normal.     Mouth/Throat:     Mouth: Mucous membranes are moist.  Eyes:     General: No scleral icterus.       Right eye: No discharge.        Left eye: No discharge.  Cardiovascular:     Rate and Rhythm: Normal rate and regular rhythm.     Pulses: Normal pulses.     Heart sounds: Normal heart sounds.  Pulmonary:     Effort: Pulmonary effort is normal. No respiratory  distress.     Breath sounds: Normal breath sounds.  Abdominal:     General: Abdomen is flat.     Palpations: Abdomen is soft.  Tenderness: There is abdominal tenderness in the suprapubic area.    Musculoskeletal:        General: Normal range of motion.     Cervical back: Normal range of motion.     Right lower leg: No edema.     Left lower leg: No edema.  Skin:    General: Skin is warm and dry.     Capillary Refill: Capillary refill takes less than 2 seconds.  Neurological:     Mental Status: She is alert and oriented to person, place, and time.     GCS: GCS eye subscore is 4. GCS verbal subscore is 5. GCS motor subscore is 6.     Cranial Nerves: No facial asymmetry.     Motor: No tremor.     Comments: Moving extremities spontaneously, sensation intact in extremities Extremities will cross midline  Variable compliance w/ neuro exam Limited ROM / strength to LUE 2/2 prior shoulder injury; strength o/w equal and symmetric   Psychiatric:        Mood and Affect: Mood normal.        Behavior: Behavior normal.     ED Results and Treatments Labs (all labs ordered are listed, but only abnormal results are displayed) Labs Reviewed  CBC WITH DIFFERENTIAL/PLATELET - Abnormal; Notable for the following components:      Result Value   WBC 10.7 (*)    RBC 3.57 (*)    Hemoglobin 10.6 (*)    HCT 33.0 (*)    RDW 15.9 (*)    Platelets 404 (*)    Neutro Abs 10.4 (*)    Lymphs Abs 0.1 (*)    All other components within normal limits  COMPREHENSIVE METABOLIC PANEL - Abnormal; Notable for the following components:   Sodium 133 (*)    Potassium 3.3 (*)    CO2 20 (*)    Calcium 8.7 (*)    Total Protein 6.1 (*)    Albumin 3.0 (*)    AST 182 (*)    ALT 53 (*)    Total Bilirubin 1.5 (*)    All other components within normal limits  LIPASE, BLOOD - Abnormal; Notable for the following components:   Lipase 161 (*)    All other components within normal limits  URINALYSIS, ROUTINE W  REFLEX MICROSCOPIC - Abnormal; Notable for the following components:   Ketones, ur 5 (*)    Leukocytes,Ua SMALL (*)    WBC, UA >50 (*)    Bacteria, UA RARE (*)    All other components within normal limits  MAGNESIUM - Abnormal; Notable for the following components:   Magnesium 1.2 (*)    All other components within normal limits  RESP PANEL BY RT-PCR (RSV, FLU A&B, COVID)  RVPGX2  URINE CULTURE  COMPREHENSIVE METABOLIC PANEL  MAGNESIUM  CBC WITH DIFFERENTIAL/PLATELET  TSH  TROPONIN I (HIGH SENSITIVITY)  TROPONIN I (HIGH SENSITIVITY)  Radiology CT Head Wo Contrast  Result Date: 12/05/2022 CLINICAL DATA:  Delirium. Patient is being treated for recurrent urinary tract infections. EXAM: CT HEAD WITHOUT CONTRAST TECHNIQUE: Contiguous axial images were obtained from the base of the skull through the vertex without intravenous contrast. RADIATION DOSE REDUCTION: This exam was performed according to the departmental dose-optimization program which includes automated exposure control, adjustment of the mA and/or kV according to patient size and/or use of iterative reconstruction technique. COMPARISON:  10/03/2022 FINDINGS: Brain: Diffuse cerebral atrophy. Ventricular dilatation consistent with central atrophy. Low-attenuation changes in the deep white matter consistent with small vessel ischemia. No abnormal extra-axial fluid collections. No mass effect or midline shift. Tianni Escamilla-white matter junctions are distinct. Basal cisterns are not effaced. No acute intracranial hemorrhage. Vascular: Intracranial arterial vascular calcifications. Skull: Calvarium appears intact. Sinuses/Orbits: Paranasal sinuses and mastoid air cells are clear. Other: None. IMPRESSION: No acute intracranial abnormalities. Chronic atrophy and small vessel ischemic changes. Electronically Signed   By: Lucienne Capers  M.D.   On: 12/05/2022 19:57   CT ABDOMEN PELVIS W CONTRAST  Result Date: 12/05/2022 CLINICAL DATA:  Acute nonlocalized abdominal pain. Chronic urinary tract infections on treatment. Generalized weakness and atrial fibrillation. EXAM: CT ABDOMEN AND PELVIS WITH CONTRAST TECHNIQUE: Multidetector CT imaging of the abdomen and pelvis was performed using the standard protocol following bolus administration of intravenous contrast. RADIATION DOSE REDUCTION: This exam was performed according to the departmental dose-optimization program which includes automated exposure control, adjustment of the mA and/or kV according to patient size and/or use of iterative reconstruction technique. CONTRAST:  71mL OMNIPAQUE IOHEXOL 300 MG/ML  SOLN COMPARISON:  11/23/2022 FINDINGS: Lower chest: Atelectasis in the lung bases. Small left pleural effusion. Coronary artery calcifications. Prominent mediastinal lymph nodes are nonspecific but likely reactive. Hepatobiliary: No focal liver abnormality is seen. Status post cholecystectomy. No biliary dilatation. Pancreas: Unremarkable. No pancreatic ductal dilatation or surrounding inflammatory changes. Spleen: Normal in size without focal abnormality. Adrenals/Urinary Tract: No adrenal gland nodules. Renal nephrograms are symmetrical without focal lesion. There is bilateral hydronephrosis and hydroureter. No ureteral stones are demonstrated. Changes likely to represent reflux or stasis. Mild wall thickening and stranding around the ureters may indicate pyelonephritis. Diffuse wall thickening of the bladder also may indicate cystitis. No bladder filling defects. Stomach/Bowel: Stomach, small bowel, and colon are not abnormally distended. No wall thickening or inflammatory changes. Appendix is not identified. Vascular/Lymphatic: Normal caliber abdominal aorta with diffuse calcification. Moderately prominent retroperitoneal lymph nodes without pathologic enlargement. These are nonspecific but  likely to be reactive. Reproductive: Uterus appears surgically absent. Ovaries are identified without abnormal mass or enlargement. Other: No free air or free fluid in the abdomen. Abdominal wall musculature appears intact. Musculoskeletal: Degenerative changes in the spine. Lumbar scoliosis convex towards the left. IMPRESSION: 1. Bilateral hydronephrosis and hydroureter suggesting reflux or stasis. No obstructing stones are demonstrated. 2. Wall thickening of the bladder and wall thickening of the ureters with periureteral stranding suggesting cystitis with pyelonephritis. 3. Aortic atherosclerosis. 4. Prominent lymph nodes in the mediastinum and retroperitoneum without pathologic enlargement, likely to be reactive. Electronically Signed   By: Lucienne Capers M.D.   On: 12/05/2022 19:54   DG Chest Portable 1 View  Result Date: 12/05/2022 CLINICAL DATA:  Pain.  Chronic UTIs EXAM: PORTABLE CHEST 1 VIEW COMPARISON:  Chest x-ray 11/23/2022 FINDINGS: Interval removal of a PICC line. The heart and mediastinal contours are unchanged. Aortic calcification. Low lung volumes. Elevated right hemidiaphragm. No focal consolidation. Chronic coarsened markings with no  overt pulmonary edema. No pleural effusion. No pneumothorax. No acute osseous abnormality. IMPRESSION: 1. No active disease in a patient with elevated right hemidiaphragm. 2.  Aortic Atherosclerosis (ICD10-I70.0). Electronically Signed   By: Iven Finn M.D.   On: 12/05/2022 18:37    Pertinent labs & imaging results that were available during my care of the patient were reviewed by me and considered in my medical decision making (see MDM for details).  Medications Ordered in ED Medications  atenolol (TENORMIN) tablet 50 mg (has no administration in time range)  diltiazem (CARDIZEM CD) 24 hr capsule 120 mg (has no administration in time range)  apixaban (ELIQUIS) tablet 2.5 mg (has no administration in time range)  acetaminophen (TYLENOL) tablet 650  mg (has no administration in time range)    Or  acetaminophen (TYLENOL) suppository 650 mg (has no administration in time range)  oxyCODONE (Oxy IR/ROXICODONE) immediate release tablet 5 mg (has no administration in time range)  morphine (PF) 2 MG/ML injection 2 mg (has no administration in time range)  ondansetron (ZOFRAN) tablet 4 mg (has no administration in time range)    Or  ondansetron (ZOFRAN) injection 4 mg (has no administration in time range)  sodium chloride 0.9 % bolus 1,000 mL (1,000 mLs Intravenous Bolus 12/05/22 1828)  cefTRIAXone (ROCEPHIN) 1 g in sodium chloride 0.9 % 100 mL IVPB (0 g Intravenous Stopped 12/05/22 1922)  labetalol (NORMODYNE) injection 10 mg (10 mg Intravenous Given 12/05/22 1859)  iohexol (OMNIPAQUE) 300 MG/ML solution 80 mL (80 mLs Intravenous Contrast Given 12/05/22 1939)  sodium chloride 0.9 % bolus 500 mL (500 mLs Intravenous New Bag/Given 12/05/22 2157)  ciprofloxacin (CIPRO) IVPB 200 mg (0 mg Intravenous Stopped 12/05/22 2256)                                                                                                                                     Procedures Procedures  (including critical care time)  Medical Decision Making / ED Course   MDM:  TAHLIA DEAMER is a 89 y.o. female with past medical history as below, significant for afib on eliquis/atenolol/cardiazem, HLD, T2DM, TIA who presents to the ED with complaint of ams, n/v, concern for UTI.cx. The complaint involves an extensive differential diagnosis and also carries with it a high risk of complications and morbidity.  Serious etiology was considered. Ddx includes but is not limited to: Differential diagnoses for altered mental status includes but is not exclusive to alcohol, illicit or prescription medications, intracranial pathology such as stroke, intracerebral hemorrhage, fever or infectious causes including sepsis, hypoxemia, uremia, trauma, endocrine related disorders such as diabetes,  hypoglycemia, thyroid-related diseases, etc.  Differential diagnosis includes but is not exclusive to ectopic pregnancy, ovarian cyst, ovarian torsion, acute appendicitis, urinary tract infection, endometriosis, bowel obstruction, hernia, colitis, renal colic, gastroenteritis, volvulus etc.   On initial assessment the patient is: resting comfortably, HR is elevated, RVR on tele, not  hypoxic Vital signs and nursing notes were reviewed  Clinical Course as of 12/06/22 0037  Wed Dec 05, 2022  2121 Culture 1/5 reviewed, kleb pna w/ carbapenemase resistance; sensitive to cipro; will start this  [SG]    Clinical Course User Index [SG] Jeanell Sparrow, DO    Labs reviewed, she has mild leukocytosis 10.7.  Hemoglobin is 10.6, similar to her baseline.  Urinalysis concerning for UTI.  Urine culture sent.  Cultures reviewed, start Cipro. Initially presented A-fib with RVR, heart rate 130s to 140s.  Given IV fluids and labetalol, heart rate improved.  Averaging from 100-110.  Her home dose of atenolol was recently reduced from 100 mg to 50 mg Needs and is also low chronically will replace intravenously  HR has improved, afib rate controlled at this time  Imaging reviewed, stable.  Elevated liver enzymes, CT imaging shows cystitis.   Magnesium and potassium are low, replaced.  She has UTI, she is not septic.  Started antibiotics.  Admit to hospital service given metabolic encephalopathy likely due to UTI.  Medication effect from ?macrobid.   Admitted to [facility]       Additional history obtained: -Additional history obtained from family -External records from outside source obtained and reviewed including: Chart review including previous notes, labs, imaging, consultation notes including prior admission, prior labs/imaging,culture reports   Lab Tests: -I ordered, reviewed, and interpreted labs.   The pertinent results include:   Labs Reviewed  CBC WITH DIFFERENTIAL/PLATELET - Abnormal;  Notable for the following components:      Result Value   WBC 10.7 (*)    RBC 3.57 (*)    Hemoglobin 10.6 (*)    HCT 33.0 (*)    RDW 15.9 (*)    Platelets 404 (*)    Neutro Abs 10.4 (*)    Lymphs Abs 0.1 (*)    All other components within normal limits  COMPREHENSIVE METABOLIC PANEL - Abnormal; Notable for the following components:   Sodium 133 (*)    Potassium 3.3 (*)    CO2 20 (*)    Calcium 8.7 (*)    Total Protein 6.1 (*)    Albumin 3.0 (*)    AST 182 (*)    ALT 53 (*)    Total Bilirubin 1.5 (*)    All other components within normal limits  LIPASE, BLOOD - Abnormal; Notable for the following components:   Lipase 161 (*)    All other components within normal limits  URINALYSIS, ROUTINE W REFLEX MICROSCOPIC - Abnormal; Notable for the following components:   Ketones, ur 5 (*)    Leukocytes,Ua SMALL (*)    WBC, UA >50 (*)    Bacteria, UA RARE (*)    All other components within normal limits  MAGNESIUM - Abnormal; Notable for the following components:   Magnesium 1.2 (*)    All other components within normal limits  RESP PANEL BY RT-PCR (RSV, FLU A&B, COVID)  RVPGX2  URINE CULTURE  COMPREHENSIVE METABOLIC PANEL  MAGNESIUM  CBC WITH DIFFERENTIAL/PLATELET  TSH  TROPONIN I (HIGH SENSITIVITY)  TROPONIN I (HIGH SENSITIVITY)    Notable for as above  EKG   EKG Interpretation  Date/Time:  Wednesday December 05 2022 16:44:33 EST Ventricular Rate:  121 PR Interval:    QRS Duration: 72 QT Interval:  283 QTC Calculation: 402 R Axis:   44 Text Interpretation: Atrial fibrillation with rapid V-rate Paired ventricular premature complexes Repolarization abnormality, prob rate related Confirmed by Wynona Dove ([id number])  on 12/05/2022 8:08:02 PM         Imaging Studies ordered: I ordered imaging studies including ctap cth cxr I independently visualized the following imaging with scope of interpretation limited to determining acute life threatening conditions related to  emergency care: likely cystitis I independently visualized and interpreted imaging. I agree with the radiologist interpretation   Medicines ordered and prescription drug management: Meds ordered this encounter  Medications   sodium chloride 0.9 % bolus 1,000 mL   DISCONTD: atenolol (TENORMIN) tablet 50 mg   cefTRIAXone (ROCEPHIN) 1 g in sodium chloride 0.9 % 100 mL IVPB    Order Specific Question:   Antibiotic Indication:    Answer:   UTI   labetalol (NORMODYNE) injection 10 mg   iohexol (OMNIPAQUE) 300 MG/ML solution 80 mL   sodium chloride 0.9 % bolus 500 mL   ciprofloxacin (CIPRO) IVPB 200 mg   DISCONTD: magnesium sulfate IVPB 2 g 50 mL   DISCONTD: labetalol (NORMODYNE) injection 5 mg   DISCONTD: potassium chloride 10 mEq in 100 mL IVPB   atenolol (TENORMIN) tablet 50 mg   diltiazem (CARDIZEM CD) 24 hr capsule 120 mg   apixaban (ELIQUIS) tablet 2.5 mg   OR Linked Order Group    acetaminophen (TYLENOL) tablet 650 mg    acetaminophen (TYLENOL) suppository 650 mg   oxyCODONE (Oxy IR/ROXICODONE) immediate release tablet 5 mg   morphine (PF) 2 MG/ML injection 2 mg   OR Linked Order Group    ondansetron (ZOFRAN) tablet 4 mg    ondansetron (ZOFRAN) injection 4 mg    -I have reviewed the patients home medicines and have made adjustments as needed   Consultations Obtained: I requested consultation with the na,  and discussed lab and imaging findings as well as pertinent plan - they recommend: na   Cardiac Monitoring: The patient was maintained on a cardiac monitor.  I personally viewed and interpreted the cardiac monitored which showed an underlying rhythm of: afib rvr  Social Determinants of Health:  Diagnosis or treatment significantly limited by social determinants of health: na   Reevaluation: After the interventions noted above, I reevaluated the patient and found that they have improved  Co morbidities that complicate the patient evaluation  Past Medical History:   Diagnosis Date   Atrial fibrillation ([place])    Coronary atherosclerosis of native coronary artery    Nonobstructive   Dyslipidemia    PSVT (paroxysmal supraventricular tachycardia)    TIA (transient ischemic attack)    Type 2 diabetes mellitus ([place])    Diet controlled      Dispostion: Disposition decision including need for hospitalization was considered, and patient admitted to the hospital.    Final Clinical Impression(s) / ED Diagnoses Final diagnoses:  Atrial fibrillation with RVR ([place])  Urinary tract infection without hematuria, site unspecified  Delirium  Metabolic encephalopathy     This chart was dictated using voice recognition software.  Despite best efforts to proofread,  errors can occur which can change the documentation meaning.    Jeanell Sparrow, DO 12/06/22 [phone]

## 2022-12-05 NOTE — ED Notes (Signed)
Receiving RN Elmyra Ricks has agreed to accept [facility] once pt has arrived to inpatient unit, all questions and concerns address.

## 2022-12-05 NOTE — ED Notes (Signed)
Asked for Dr. Pearline Cables to start need for possible 2nd IV, ultrasound guided, at this time, not needed as orders for mag and potassium to be canceled per Dr. Pearline Cables.

## 2022-12-05 NOTE — ED Notes (Signed)
Patient transported to CT 

## 2022-12-05 NOTE — ED Notes (Signed)
Two different RNs attempted IV access, unable to obtain IV access, will ask RN certified in ultrasound IV to attempt IV access

## 2022-12-05 NOTE — ED Notes (Signed)
Peri care done, brief changed, new purwick applied, and new chuck applied under pt, as pt had a moderate soft stool BM before transport to unit. Pt cleaned and to be transported to unit as tele by EMT

## 2022-12-05 NOTE — ED Notes (Signed)
Attempted x2 for IV access w/failed attempts, another RN to try for IV, possible need for ultrasound guided.

## 2022-12-05 NOTE — ED Triage Notes (Signed)
Pt arrived via [facility] c/o chronic UTIs in which she has been treated and then PCP kept her on antibiotic low dose for recurrent UTIs  . Presents today with generalized weakness and a fib RVR per EMS, highest was 174

## 2022-12-06 DIAGNOSIS — G9341 Metabolic encephalopathy: Secondary | ICD-10-CM

## 2022-12-06 DIAGNOSIS — E8809 Other disorders of plasma-protein metabolism, not elsewhere classified: Secondary | ICD-10-CM | POA: Insufficient documentation

## 2022-12-06 DIAGNOSIS — Z23 Encounter for immunization: Secondary | ICD-10-CM | POA: Diagnosis not present

## 2022-12-06 DIAGNOSIS — E876 Hypokalemia: Secondary | ICD-10-CM | POA: Insufficient documentation

## 2022-12-06 DIAGNOSIS — N10 Acute tubulo-interstitial nephritis: Secondary | ICD-10-CM | POA: Diagnosis not present

## 2022-12-06 DIAGNOSIS — N390 Urinary tract infection, site not specified: Secondary | ICD-10-CM

## 2022-12-06 DIAGNOSIS — A419 Sepsis, unspecified organism: Secondary | ICD-10-CM | POA: Diagnosis not present

## 2022-12-06 DIAGNOSIS — R7401 Elevation of levels of liver transaminase levels: Secondary | ICD-10-CM | POA: Insufficient documentation

## 2022-12-06 DIAGNOSIS — I4891 Unspecified atrial fibrillation: Secondary | ICD-10-CM | POA: Diagnosis not present

## 2022-12-06 DIAGNOSIS — I10 Essential (primary) hypertension: Secondary | ICD-10-CM

## 2022-12-06 LAB — CBC WITH DIFFERENTIAL/PLATELET
Abs Immature Granulocytes: 0.13 10*3/uL — ABNORMAL HIGH (ref 0.00–0.07)
Basophils Absolute: 0 10*3/uL (ref 0.0–0.1)
Basophils Relative: 0 %
Eosinophils Absolute: 0 10*3/uL (ref 0.0–0.5)
Eosinophils Relative: 0 %
HCT: 26.7 % — ABNORMAL LOW (ref 36.0–46.0)
Hemoglobin: 8.8 g/dL — ABNORMAL LOW (ref 12.0–15.0)
Immature Granulocytes: 1 %
Lymphocytes Relative: 1 %
Lymphs Abs: 0.1 10*3/uL — ABNORMAL LOW (ref 0.7–4.0)
MCH: 29.7 pg (ref 26.0–34.0)
MCHC: 33 g/dL (ref 30.0–36.0)
MCV: 90.2 fL (ref 80.0–100.0)
Monocytes Absolute: 0.5 10*3/uL (ref 0.1–1.0)
Monocytes Relative: 3 %
Neutro Abs: 16.2 10*3/uL — ABNORMAL HIGH (ref 1.7–7.7)
Neutrophils Relative %: 95 %
Platelets: 410 10*3/uL — ABNORMAL HIGH (ref 150–400)
RBC: 2.96 MIL/uL — ABNORMAL LOW (ref 3.87–5.11)
RDW: 16.1 % — ABNORMAL HIGH (ref 11.5–15.5)
WBC: 16.9 10*3/uL — ABNORMAL HIGH (ref 4.0–10.5)
nRBC: 0 % (ref 0.0–0.2)

## 2022-12-06 LAB — URINE CULTURE: Culture: NO GROWTH

## 2022-12-06 LAB — COMPREHENSIVE METABOLIC PANEL
ALT: 97 U/L — ABNORMAL HIGH (ref 0–44)
AST: 187 U/L — ABNORMAL HIGH (ref 15–41)
Albumin: 2.5 g/dL — ABNORMAL LOW (ref 3.5–5.0)
Alkaline Phosphatase: 68 U/L (ref 38–126)
Anion gap: 9 (ref 5–15)
BUN: 23 mg/dL (ref 8–23)
CO2: 21 mmol/L — ABNORMAL LOW (ref 22–32)
Calcium: 7.8 mg/dL — ABNORMAL LOW (ref 8.9–10.3)
Chloride: 104 mmol/L (ref 98–111)
Creatinine, Ser: 0.81 mg/dL (ref 0.44–1.00)
GFR, Estimated: >60 mL/min (ref >60)
Glucose, Bld: 143 mg/dL — ABNORMAL HIGH (ref 70–99)
Potassium: 3.8 mmol/L (ref 3.5–5.1)
Sodium: 134 mmol/L — ABNORMAL LOW (ref 135–145)
Total Bilirubin: 0.9 mg/dL (ref 0.3–1.2)
Total Protein: 5.2 g/dL — ABNORMAL LOW (ref 6.5–8.1)

## 2022-12-06 LAB — MAGNESIUM
Magnesium: 1.1 mg/dL — ABNORMAL LOW (ref 1.7–2.4)
Magnesium: 1.2 mg/dL — ABNORMAL LOW (ref 1.7–2.4)
Magnesium: 2.4 mg/dL (ref 1.7–2.4)

## 2022-12-06 LAB — TSH: TSH: 0.966 u[IU]/mL (ref 0.350–4.500)

## 2022-12-06 LAB — GLUCOSE, CAPILLARY
Glucose-Capillary: 137 mg/dL — ABNORMAL HIGH (ref 70–99)
Glucose-Capillary: 185 mg/dL — ABNORMAL HIGH (ref 70–99)
Glucose-Capillary: 87 mg/dL (ref 70–99)

## 2022-12-06 MED ORDER — MAGNESIUM SULFATE 2 GM/50ML IV SOLN: 2.0000 g | Freq: Once | INTRAVENOUS | Status: DC

## 2022-12-06 MED ORDER — INSULIN ASPART 100 UNIT/ML IJ SOLN: 0.0000 [IU] | Freq: Every day | INTRAMUSCULAR | Status: DC

## 2022-12-06 MED ORDER — ACETAMINOPHEN 500 MG PO TABS
500.0000 mg | ORAL_TABLET | Freq: Once | ORAL | Status: AC
  Administered 2022-12-06: 500 mg via ORAL
  Filled 2022-12-06: qty 1

## 2022-12-06 MED ORDER — POTASSIUM CHLORIDE 10 MEQ/100ML IV SOLN: 10.0000 meq | INTRAVENOUS | Status: DC

## 2022-12-06 MED ORDER — POTASSIUM CHLORIDE 10 MEQ/100ML IV SOLN
10.0000 meq | INTRAVENOUS | Status: AC
  Administered 2022-12-06 (×2): 10 meq via INTRAVENOUS
  Filled 2022-12-06 (×2): qty 100

## 2022-12-06 MED ORDER — MAGNESIUM SULFATE 4 GM/100ML IV SOLN
4.0000 g | Freq: Once | INTRAVENOUS | Status: AC
  Administered 2022-12-06: 4 g via INTRAVENOUS
  Filled 2022-12-06: qty 100

## 2022-12-06 MED ORDER — SODIUM CHLORIDE 0.9 % IV SOLN
1.0000 g | INTRAVENOUS | Status: DC
  Administered 2022-12-06 – 2022-12-07 (×2): 1 g via INTRAVENOUS
  Filled 2022-12-06 (×2): qty 10

## 2022-12-06 MED ORDER — MAGNESIUM SULFATE 4 GM/100ML IV SOLN: 4.0000 g | Freq: Once | INTRAVENOUS | Status: DC

## 2022-12-06 MED ORDER — ENSURE ENLIVE PO LIQD
237.0000 mL | Freq: Two times a day (BID) | ORAL | Status: DC
  Administered 2022-12-06 – 2022-12-07 (×2): 237 mL via ORAL

## 2022-12-06 MED ORDER — INSULIN ASPART 100 UNIT/ML IJ SOLN
0.0000 [IU] | Freq: Three times a day (TID) | INTRAMUSCULAR | Status: DC
  Administered 2022-12-06: 1 [IU] via SUBCUTANEOUS
  Administered 2022-12-06: 2 [IU] via SUBCUTANEOUS

## 2022-12-06 MED ORDER — CIPROFLOXACIN IN D5W 400 MG/200ML IV SOLN
400.0000 mg | Freq: Two times a day (BID) | INTRAVENOUS | Status: DC
  Filled 2022-12-06: qty 200

## 2022-12-06 NOTE — Assessment & Plan Note (Signed)
-  Continue atenolol and diltiazem

## 2022-12-06 NOTE — Assessment & Plan Note (Signed)
-  Tachycardia, tachypnea, with acute metabolic encephalopathy - Suspected source is UTI - UA is indicative of UTI, CT abdomen pelvis shows cystitis with pyelonephritis and reactive lymphadenopathy - Previous micro shows multiple resistances, but susceptible to Cipro - Continue Cipro - Urine culture pending - Unfortunately blood cultures were not drawn prior to antibiotics - Continue monitor

## 2022-12-06 NOTE — Assessment & Plan Note (Signed)
-  Likely due to poor p.o. intake - As patient's mentation clears encourage nutrient dense p.o. intake - Trend in the a.m.

## 2022-12-06 NOTE — Assessment & Plan Note (Signed)
-  2 g mag sulfate administered in ED - Trend in the a.m.

## 2022-12-06 NOTE — Progress Notes (Signed)
Consult from primary team for afib with RVR. Patient with history of permanent afib on atenolol 50mg  bid, diltiazem 120mg , and eliquis 2.5mg  bid at home. Admitted with sepsis secondary to UTI and pyelonephritis, AMS. In this setting afib with elevated rates.  Remains on home regimen on admission, rates on tele review right now low 100s though were higher in early AM. Her evening atenolol, had some issues with low bp's initially. This AM received her atenolol 50mg , dilt 120 around 840 AM. BP's 110/53 and heart rate low 100s. Don't have any additional recs today and thus have not completed a full consult  but will follow up patient's telemetry tomorrow. I think expected HR response given her permanent afib and sepsis presentation, HRs and bp's seem to be settling down.    Carlyle Dolly MD

## 2022-12-06 NOTE — Progress Notes (Signed)
Initial Nutrition Assessment  DOCUMENTATION CODES:      INTERVENTION:  Calorie Count- starts today at lunch until->Sunday dinner  Results to follow Monday when RD returns  Ensure Plus BID due to current poor meal intake   NUTRITION DIAGNOSIS:   Inadequate oral intake related to acute illness (sepsis / UTI) as evidenced by per patient/family report (current intake poor compared to usual intake at baseline good per daughter).   GOAL:  Patient will meet greater than or equal to 90% of their needs  MONITOR:  PO intake, Supplement acceptance, Labs, Weight trends (Calorie count results)  REASON FOR ASSESSMENT:   Consult Calorie Count  ASSESSMENT: Patient is a 89 yo female with dementia, recurrent UTI's, atrial fibrillation, CAD, DM2 (diet-controlled) who presented with agitation and confusion. Acute metabolic encephalopathy, severe sepsis, hypomagnesemia at admission.   Daughter is bedside and provided nutrition background. Patient is cared for by her daughter and a caregiver. When she is well her appetite is good and consumes 3 meals daily (able to feed herself). She has not been drinking ONS at home due to ample meal/fluid intake. Her breakfast tray is here and untouched. Appetite historically poor with infection per family. Discussed the benefit of ONS while she is not eating.  Calorie count ordered and will start today at lunch through Sunday dinner. RD not onsite Saturday or Sunday. Will provide results on Monday. Nutrition services have been notified as well as nursing.   Weights reivewed- current weight 67.9 kg (149.3 lb). Daughter reports weight at last Dr. visit 137 lb.   Patient ambulates with a walker in the house. Stand by assistance provided.   Medications: insulin. IV antibiotic       Latest Ref Rng & Units 12/06/2022    4:17 AM 12/05/2022    5:59 PM 11/26/2022    3:35 AM  BMP  Glucose 70 - 99 mg/dL 143  95  142   BUN 8 - 23 mg/dL 23  20  24    Creatinine 0.44 -  1.00 mg/dL 0.81  0.72  0.67   Sodium 135 - 145 mmol/L 134  133  130   Potassium 3.5 - 5.1 mmol/L 3.8  3.3  4.4   Chloride 98 - 111 mmol/L 104  99  103   CO2 22 - 32 mmol/L 21  20  17    Calcium 8.9 - 10.3 mg/dL 7.8  8.7  8.6      NUTRITION - FOCUSED PHYSICAL EXAM:  Flowsheet Row Most Recent Value  Orbital Region No depletion  Upper Arm Region No depletion  Thoracic and Lumbar Region No depletion  Buccal Region Mild depletion  Temple Region No depletion  Clavicle Bone Region Mild depletion  Clavicle and Acromion Bone Region Mild depletion  Scapular Bone Region Unable to assess  Patellar Region No depletion  Anterior Thigh Region Mild depletion  Posterior Calf Region Mild depletion  Edema (RD Assessment) Mild  Hair Reviewed  Eyes Reviewed  Mouth Reviewed  Skin Reviewed       Diet Order:   Diet Order             Diet Heart Room service appropriate? Yes; Fluid consistency: Thin  Diet effective now                   EDUCATION NEEDS:  No education needs have been identified at this time  Skin:  Skin Assessment: Reviewed RN Assessment  Last BM:  1/17 type 5  Height:   Ht  Readings from Last 1 Encounters:  12/05/22 5' (1.524 m)    Weight:   Wt Readings from Last 1 Encounters:  12/05/22 67.9 kg    Ideal Body Weight:   45 kg  BMI:  Body mass index is 29.23 kg/m.  Estimated Nutritional Needs:   Kcal:  1600-1800  Protein:  80-85 gr  Fluid:  > 1500 ml daily  Colman Cater MS,RD,CSG,LDN Contact: Shea Evans

## 2022-12-06 NOTE — Assessment & Plan Note (Signed)
-  Trending down from previous blood draw - AST previously 320, now 182 - ALT previously 157, now 53 - 1.5 L bolus given in the ED - Trend in the a.m.

## 2022-12-06 NOTE — Assessment & Plan Note (Signed)
-  As shown on CT - Continue Cipro - See plan for UTI

## 2022-12-06 NOTE — Assessment & Plan Note (Signed)
-  Heart rate up to 133 - Heart rate improved with fluids - Likely by infection - Continue atenolol and diltiazem - Continue to monitor

## 2022-12-06 NOTE — TOC Initial Note (Signed)
Transition of Care [facility]) - Initial/Assessment Note    Patient Details  Name: [name] MRN: [id number] Date of Birth: 1927/04/12  Transition of Care [facility]) CM/SW Contact:    Ihor Gully, LCSW Phone Number: 12/06/2022, 1:53 PM  Clinical Narrative:                 Patient from home with daughter, [name]. Admitted for Afib with RVR. Has 24/7 care. Uses walker. ADLs are performed by [name]. Active with [name] RN, PT, OT, SW. Considered High risk for readmission.   Expected Discharge Plan: [facility] Barriers to Discharge: Continued Medical Work up   Patient Goals and CMS Choice            Expected Discharge Plan and Services       Living arrangements for the past 2 months: Single Family Home                                      Prior Living Arrangements/Services Living arrangements for the past 2 months: Single Family Home Lives with:: Adult Children              Current home services: DME, Home RN, Home PT, Home OT    Activities of Daily Living      Permission Sought/Granted                  Emotional Assessment       Orientation: : Oriented to Self Alcohol / Substance Use: Not Applicable Psych Involvement: No (comment)  Admission diagnosis:  Metabolic encephalopathy [F09.32] Delirium [R41.0] Atrial fibrillation with RVR ([place]) [I48.91] Urinary tract infection without hematuria, site unspecified [N39.0] Patient Active Problem List   Diagnosis Date Noted   Acute pyelonephritis 12/06/2022   Hypoalbuminemia 12/06/2022   Transaminitis 12/06/2022   Hypokalemia 12/06/2022   Atrial fibrillation with RVR ([place]) 12/05/2022   Sepsis ([place]) 11/23/2022   Fall at home, initial encounter 10/04/2022   Hypoalbuminemia due to protein-calorie malnutrition ([place]) 10/04/2022   Sepsis secondary to UTI ([place]) 10/03/2022   Chronic diastolic CHF (congestive heart failure) ([place]) 09/24/2022   COVID-19 virus infection 09/20/2022    Diverticulitis of colon    Pressure injury of skin 08/18/2022   Dyslipidemia    Type 2 diabetes mellitus ([place])    Hypomagnesemia 05/03/2022   Anemia of chronic disease 05/03/2022   B12 deficiency 05/03/2022   Paroxysmal atrial fibrillation ([place]) 35/57/3220   Acute metabolic encephalopathy 25/42/7062   Acute cystitis without hematuria 12/01/2021   Current use of long term anticoagulation 08/15/2021   Generalized weakness 08/15/2021   Body mass index 29.0-29.9, adult 07/12/2017   Low back pain 07/12/2017   Proteinuria 11/21/2016   Chronic kidney disease, stage III (moderate) ([place]) 11/19/2016   Vascular insufficiency of intestine ([place]) 06/23/2014   Gouty arthropathy 06/11/2014   Osteoarthrosis 07/24/2013   Coronary atherosclerosis of native coronary artery 10/13/2012   Essential hypertension, benign 09/12/2011   PCP:  Manon Hilding, MD Pharmacy:   [id number] - [place], [place] [address] [place] [address] Phone: [phone] Fax: [phone]  [place], [place] [address] [place] [address] [place] [address] Phone: [phone] Fax: [phone]     Social Determinants of Health (SDOH) Social History: SDOH Screenings   Food Insecurity: No  Food Insecurity (11/23/2022)  Housing: Low Risk  (11/23/2022)  Transportation Needs: No Transportation Needs (11/23/2022)  Utilities: Not At Risk (11/23/2022)  Financial Resource Strain: Low Risk  (07/13/2022)  Tobacco Use: Low Risk  (12/05/2022)   SDOH Interventions:     Readmission Risk Interventions    11/24/2022   10:52 AM  Readmission Risk Prevention Plan  Transportation Screening Complete  Medication Review ([place]) Complete  PCP or Specialist appointment within 3-5 days of discharge Not Complete  HRI or [place] Complete  SW Recovery Care/Counseling Consult Complete  [place] Not Complete

## 2022-12-06 NOTE — Assessment & Plan Note (Signed)
-  Likely due to infection - CT head showed no acute intracranial abnormalities - UA shows probable UTI - Based on previous micro - Urine culture pending - Continue to monitor

## 2022-12-06 NOTE — Progress Notes (Signed)
PROGRESS NOTE    [name]  FFM:[id number] DOB: [birth year] DOA: 12/05/2022 PCP: Manon Hilding, MD   Brief Narrative:  HPI: [name] is a 89 y.o. female with medical history significant of atrial fibrillation, coronary atherosclerosis of native artery, hyperlipidemia, paroxysmal supraventricular tachycardia, type 2 diabetes mellitus diet-controlled, and more presents the ED with a chief complaint of altered mental status.  Unfortunately, patient is not able to provide me with any history.  Chart review reveals that there was concern for nausea and vomiting and possible UTI.  Family said this is how she usually presents with UTI.  Patient has had multiple recurrent UTIs.  Attempted outpatient Macrobid for prophylaxis, but patient was not able to tolerate it.  Patient had reported suprapubic pain.  Patient presented with agitation confusion.  She had a similar presentation earlier this month.  No further history could be obtained at this time.   Assessment & Plan:   Principal Problem:   Atrial fibrillation with RVR (HCC) Active Problems:   Acute metabolic encephalopathy   Sepsis secondary to UTI [facility])   Essential hypertension, benign   Hypomagnesemia   Acute pyelonephritis   Hypoalbuminemia   Transaminitis   Hypokalemia  Atrial fibrillation with RVR ([place]): Rate for up this morning, patient did not receive her atenolol yesterday, she did receive her atenolol and diltiazem this morning, due to elevated heart rate and low blood pressure, I have consulted cardiology but at this point in time, they do not have any other further recommendations, they recommend continue current management and they will follow along.   Severe sepsis secondary to UTI and acute pyelonephritis (HCC)/bilateral hydronephrosis and hydroureter: -Patient met severe sepsis criteria based on tachycardia, tachypnea, with acute metabolic encephalopathy. Suspected source is UTI.  She received a dose of Rocephin but then  was started on ciprofloxacin, I will discontinue ciprofloxacin and started on Rocephin again and follow urine and blood culture.   Acute metabolic encephalopathy in the setting of dementia: - Likely due to infection, she is still encephalopathy, son and daughter-in-law at the bedside tells me that although she has dementia but on good days, she does know the answers to many questions which she was not able to answer today. CT head showed no acute intracranial abnormalities   Hypokalemia: Resolved.  Hypomagnesemia: Replaced today.  Recheck in the morning.   Transaminitis: Slightly elevated LFTs compared to yesterday, likely in the setting of sepsis.  Monitor closely.   Hypoalbuminemia: - Likely due to poor p.o. intake - As patient's mentation clears encourage nutrient dense p.o. intake.  I will consult dietitian as well.  Essential hypertension, benign: Blood pressure on the low normal side.  Per cardiology recommendations, Continue atenolol and diltiazem.   DVT prophylaxis: apixaban (ELIQUIS) tablet 2.5 mg Start: 12/06/22 0045 SCDs Start: 12/05/22 2350   Code Status: DNR  Family Communication: Discussed with son and daughter-in-law at the bedside.  They also verified that patient is supposed to be DNR, CODE STATUS changed to DNR. Status is: Inpatient Remains inpatient appropriate because: Patient encephalopathic.   Estimated body mass index is 29.23 kg/m as calculated from the following:   Height as of this encounter: 5' (1.524 m).   Weight as of this encounter: 67.9 kg.    Nutritional Assessment: Body mass index is 29.23 kg/m.Marland Kitchen Seen by dietician.  I agree with the assessment and plan as outlined below: Nutrition Status:        . Skin Assessment: I have examined the patient's  skin and I agree with the wound assessment as performed by the wound care RN as outlined below:    Consultants:  None  Procedures:  None  Antimicrobials:  Anti-infectives (From admission,  onward)    Start     Dose/Rate Route Frequency Ordered Stop   12/06/22 1000  ciprofloxacin (CIPRO) IVPB 400 mg  Status:  Discontinued        400 mg 200 mL/hr over 60 Minutes Intravenous Every 12 hours 12/06/22 0421 12/06/22 0811   12/06/22 0900  cefTRIAXone (ROCEPHIN) 1 g in sodium chloride 0.9 % 100 mL IVPB        1 g 200 mL/hr over 30 Minutes Intravenous Every 24 hours 12/06/22 0811     12/05/22 2130  ciprofloxacin (CIPRO) IVPB 200 mg        200 mg 100 mL/hr over 60 Minutes Intravenous  Once 12/05/22 2121 12/05/22 2256   12/05/22 1800  cefTRIAXone (ROCEPHIN) 1 g in sodium chloride 0.9 % 100 mL IVPB        1 g 200 mL/hr over 30 Minutes Intravenous  Once 12/05/22 1756 12/05/22 1922         Subjective: Patient seen and examined, she was alert but confused.  Son and daughter-in-law at the bedside.  Patient continued to moan and she was moaning even with light touch to the body.  Objective: Vitals:   12/05/22 2343 12/06/22 0508 12/06/22 0622 12/06/22 0841  BP: 118/64 (!) 89/41 (!) 98/45 (!) 110/53  Pulse: (!) 109 (!) 107 (!) 110 (!) 124  Resp: 19 19 18    Temp: 99 F (37.2 C) 99.1 F (37.3 C)    TempSrc: Oral     SpO2: 94% 97% 96%   Weight:      Height:        Intake/Output Summary (Last 24 hours) at 12/06/2022 1107 Last data filed at 12/06/2022 0610 Gross per 24 hour  Intake 200 ml  Output 300 ml  Net -100 ml   Filed Weights   12/05/22 1711  Weight: 67.9 kg    Examination:  General exam: Appears confused and fidgety. Respiratory system: Clear to auscultation. Respiratory effort normal. Cardiovascular system: S1 & S2 heard, RRR. No JVD, murmurs, rubs, gallops or clicks. No pedal edema. Gastrointestinal system: Abdomen is nondistended, soft and has generalized tenderness. No organomegaly or masses felt. Normal bowel sounds heard. Central nervous system: Alert but not oriented, moving all extremities spontaneously, no focal deficit. Extremities: Symmetric 5 x 5  power. Skin: No rashes, lesions or ulcers  Data Reviewed: I have personally reviewed following labs and imaging studies  CBC: Recent Labs  Lab 12/05/22 1759 12/06/22 0417  WBC 10.7* 16.9*  NEUTROABS 10.4* 16.2*  HGB 10.6* 8.8*  HCT 33.0* 26.7*  MCV 92.4 90.2  PLT 404* 818*   Basic Metabolic Panel: Recent Labs  Lab 12/05/22 1759 12/06/22 0417  NA 133* 134*  K 3.3* 3.8  CL 99 104  CO2 20* 21*  GLUCOSE 95 143*  BUN 20 23  CREATININE 0.72 0.81  CALCIUM 8.7* 7.8*  MG 1.2* 1.1*   GFR: Estimated Creatinine Clearance: 37.3 mL/min (by C-G formula based on SCr of 0.81 mg/dL). Liver Function Tests: Recent Labs  Lab 12/05/22 1759 12/06/22 0417  AST 182* 187*  ALT 53* 97*  ALKPHOS 68 68  BILITOT 1.5* 0.9  PROT 6.1* 5.2*  ALBUMIN 3.0* 2.5*   Recent Labs  Lab 12/05/22 1759  LIPASE 161*   No results for input(s): "AMMONIA"  in the last 168 hours. Coagulation Profile: No results for input(s): "INR", "PROTIME" in the last 168 hours. Cardiac Enzymes: No results for input(s): "CKTOTAL", "CKMB", "CKMBINDEX", "TROPONINI" in the last 168 hours. BNP (last 3 results) No results for input(s): "PROBNP" in the last 8760 hours. HbA1C: No results for input(s): "HGBA1C" in the last 72 hours. CBG: No results for input(s): "GLUCAP" in the last 168 hours. Lipid Profile: No results for input(s): "CHOL", "HDL", "LDLCALC", "TRIG", "CHOLHDL", "LDLDIRECT" in the last 72 hours. Thyroid Function Tests: Recent Labs    12/06/22 0417  TSH 0.966   Anemia Panel: No results for input(s): "VITAMINB12", "FOLATE", "FERRITIN", "TIBC", "IRON", "RETICCTPCT" in the last 72 hours. Sepsis Labs: No results for input(s): "PROCALCITON", "LATICACIDVEN" in the last 168 hours.  Recent Results (from the past 240 hour(s))  Resp panel by RT-PCR (RSV, Flu A&B, Covid) Anterior Nasal Swab     Status: None   Collection Time: 12/05/22  5:31 PM   Specimen: Anterior Nasal Swab  Result Value Ref Range Status    SARS Coronavirus 2 by RT PCR NEGATIVE NEGATIVE Final    Comment: (NOTE) SARS-CoV-2 target nucleic acids are NOT DETECTED.  The SARS-CoV-2 RNA is generally detectable in upper respiratory specimens during the acute phase of infection. The lowest concentration of SARS-CoV-2 viral copies this assay can detect is 138 copies/mL. A negative result does not preclude SARS-Cov-2 infection and should not be used as the sole basis for treatment or other patient management decisions. A negative result may occur with  improper specimen collection/handling, submission of specimen other than nasopharyngeal swab, presence of viral mutation(s) within the areas targeted by this assay, and inadequate number of viral copies(<138 copies/mL). A negative result must be combined with clinical observations, patient history, and epidemiological information. The expected result is Negative.  Fact Sheet for Patients:  EntrepreneurPulse.com.au  Fact Sheet for Healthcare Providers:  IncredibleEmployment.be  This test is no t yet approved or cleared by the [place] FDA and  has been authorized for detection and/or diagnosis of SARS-CoV-2 by FDA under an Emergency Use Authorization (EUA). This EUA will remain  in effect (meaning this test can be used) for the duration of the COVID-19 declaration under Section 564(b)(1) of the Act, 21 U.S.C.section 360bbb-3(b)(1), unless the authorization is terminated  or revoked sooner.       Influenza A by PCR NEGATIVE NEGATIVE Final   Influenza B by PCR NEGATIVE NEGATIVE Final    Comment: (NOTE) The Xpert Xpress SARS-CoV-2/FLU/RSV plus assay is intended as an aid in the diagnosis of influenza from Nasopharyngeal swab specimens and should not be used as a sole basis for treatment. Nasal washings and aspirates are unacceptable for Xpert Xpress SARS-CoV-2/FLU/RSV testing.  Fact Sheet for  Patients: EntrepreneurPulse.com.au  Fact Sheet for Healthcare Providers: IncredibleEmployment.be  This test is not yet approved or cleared by the [place] FDA and has been authorized for detection and/or diagnosis of SARS-CoV-2 by FDA under an Emergency Use Authorization (EUA). This EUA will remain in effect (meaning this test can be used) for the duration of the COVID-19 declaration under Section 564(b)(1) of the Act, 21 U.S.C. section 360bbb-3(b)(1), unless the authorization is terminated or revoked.     Resp Syncytial Virus by PCR NEGATIVE NEGATIVE Final    Comment: (NOTE) Fact Sheet for Patients: EntrepreneurPulse.com.au  Fact Sheet for Healthcare Providers: IncredibleEmployment.be  This test is not yet approved or cleared by the [place] FDA and has been authorized for detection and/or diagnosis of  SARS-CoV-2 by FDA under an Emergency Use Authorization (EUA). This EUA will remain in effect (meaning this test can be used) for the duration of the COVID-19 declaration under Section 564(b)(1) of the Act, 21 U.S.C. section 360bbb-3(b)(1), unless the authorization is terminated or revoked.  Performed at [facility], [address], [place], [place] [address]      Radiology Studies: CT Head Wo Contrast  Result Date: 12/05/2022 CLINICAL DATA:  Delirium. Patient is being treated for recurrent urinary tract infections. EXAM: CT HEAD WITHOUT CONTRAST TECHNIQUE: Contiguous axial images were obtained from the base of the skull through the vertex without intravenous contrast. RADIATION DOSE REDUCTION: This exam was performed according to the departmental dose-optimization program which includes automated exposure control, adjustment of the mA and/or kV according to patient size and/or use of iterative reconstruction technique. COMPARISON:  10/03/2022 FINDINGS: Brain: Diffuse cerebral atrophy. Ventricular  dilatation consistent with central atrophy. Low-attenuation changes in the deep white matter consistent with small vessel ischemia. No abnormal extra-axial fluid collections. No mass effect or midline shift. Gray-white matter junctions are distinct. Basal cisterns are not effaced. No acute intracranial hemorrhage. Vascular: Intracranial arterial vascular calcifications. Skull: Calvarium appears intact. Sinuses/Orbits: Paranasal sinuses and mastoid air cells are clear. Other: None. IMPRESSION: No acute intracranial abnormalities. Chronic atrophy and small vessel ischemic changes. Electronically Signed   By: Lucienne Capers M.D.   On: 12/05/2022 19:57   CT ABDOMEN PELVIS W CONTRAST  Result Date: 12/05/2022 CLINICAL DATA:  Acute nonlocalized abdominal pain. Chronic urinary tract infections on treatment. Generalized weakness and atrial fibrillation. EXAM: CT ABDOMEN AND PELVIS WITH CONTRAST TECHNIQUE: Multidetector CT imaging of the abdomen and pelvis was performed using the standard protocol following bolus administration of intravenous contrast. RADIATION DOSE REDUCTION: This exam was performed according to the departmental dose-optimization program which includes automated exposure control, adjustment of the mA and/or kV according to patient size and/or use of iterative reconstruction technique. CONTRAST:  43mL OMNIPAQUE IOHEXOL 300 MG/ML  SOLN COMPARISON:  11/23/2022 FINDINGS: Lower chest: Atelectasis in the lung bases. Small left pleural effusion. Coronary artery calcifications. Prominent mediastinal lymph nodes are nonspecific but likely reactive. Hepatobiliary: No focal liver abnormality is seen. Status post cholecystectomy. No biliary dilatation. Pancreas: Unremarkable. No pancreatic ductal dilatation or surrounding inflammatory changes. Spleen: Normal in size without focal abnormality. Adrenals/Urinary Tract: No adrenal gland nodules. Renal nephrograms are symmetrical without focal lesion. There is  bilateral hydronephrosis and hydroureter. No ureteral stones are demonstrated. Changes likely to represent reflux or stasis. Mild wall thickening and stranding around the ureters may indicate pyelonephritis. Diffuse wall thickening of the bladder also may indicate cystitis. No bladder filling defects. Stomach/Bowel: Stomach, small bowel, and colon are not abnormally distended. No wall thickening or inflammatory changes. Appendix is not identified. Vascular/Lymphatic: Normal caliber abdominal aorta with diffuse calcification. Moderately prominent retroperitoneal lymph nodes without pathologic enlargement. These are nonspecific but likely to be reactive. Reproductive: Uterus appears surgically absent. Ovaries are identified without abnormal mass or enlargement. Other: No free air or free fluid in the abdomen. Abdominal wall musculature appears intact. Musculoskeletal: Degenerative changes in the spine. Lumbar scoliosis convex towards the left. IMPRESSION: 1. Bilateral hydronephrosis and hydroureter suggesting reflux or stasis. No obstructing stones are demonstrated. 2. Wall thickening of the bladder and wall thickening of the ureters with periureteral stranding suggesting cystitis with pyelonephritis. 3. Aortic atherosclerosis. 4. Prominent lymph nodes in the mediastinum and retroperitoneum without pathologic enlargement, likely to be reactive. Electronically Signed   By: Oren Beckmann.D.  On: 12/05/2022 19:54   DG Chest Portable 1 View  Result Date: 12/05/2022 CLINICAL DATA:  Pain.  Chronic UTIs EXAM: PORTABLE CHEST 1 VIEW COMPARISON:  Chest x-ray 11/23/2022 FINDINGS: Interval removal of a PICC line. The heart and mediastinal contours are unchanged. Aortic calcification. Low lung volumes. Elevated right hemidiaphragm. No focal consolidation. Chronic coarsened markings with no overt pulmonary edema. No pleural effusion. No pneumothorax. No acute osseous abnormality. IMPRESSION: 1. No active disease in a  patient with elevated right hemidiaphragm. 2.  Aortic Atherosclerosis (ICD10-I70.0). Electronically Signed   By: Iven Finn M.D.   On: 12/05/2022 18:37    Scheduled Meds:  apixaban  2.5 mg Oral BID   atenolol  50 mg Oral BID   diltiazem  120 mg Oral Daily   insulin aspart  0-5 Units Subcutaneous QHS   insulin aspart  0-9 Units Subcutaneous TID WC   Continuous Infusions:  cefTRIAXone (ROCEPHIN)  IV 1 g (12/06/22 1052)   magnesium sulfate bolus IVPB       LOS: 1 day   Darliss Cheney, MD [facility]  12/06/2022, 11:07 AM   *Please note that this is a verbal dictation therefore any spelling or grammatical errors are due to the "[employer]" system interpretation.  Please page via [place] and do not message via secure chat for urgent patient care matters. Secure chat can be used for non urgent patient care matters.  How to contact the [facility] Attending or Consulting provider [place] or covering provider during after hours [place], for this patient?  Check the care team in [facility] and look for a) attending/consulting [facility] provider listed and b) the [facility] team listed. Page or secure chat [phone]. Log into [email or web address] and use [place]'s universal password to access. If you do not have the password, please contact the hospital operator. Locate the [facility] provider you are looking for under [facility] and page to a number that you can be directly reached. If you still have difficulty reaching the provider, please page the [facility] (Director on Call) for the Hospitalists listed on amion for assistance.

## 2022-12-06 NOTE — H&P (Signed)
History and Physical    Patient: [name] UMP:[id number] DOB: [birth year] DOA: 12/05/2022 DOS: the patient was seen and examined on 12/06/2022 PCP: Manon Hilding, MD  Patient coming from: Home  Chief Complaint:  Chief Complaint  Patient presents with   Weakness   HPI: [name] is a 89 y.o. female with medical history significant of atrial fibrillation, coronary atherosclerosis of native artery, hyperlipidemia, paroxysmal supraventricular tachycardia, type 2 diabetes mellitus diet-controlled, and more presents the ED with a chief complaint of altered mental status.  Unfortunately, patient is not able to provide me with any history.  Chart review reveals that there was concern for nausea and vomiting and possible UTI.  Family said this is how she usually presents with UTI.  Patient has had multiple recurrent UTIs.  Attempted outpatient Macrobid for prophylaxis, but patient was not able to tolerate it.  Patient had reported suprapubic pain.  Patient presented with agitation confusion.  She had a similar presentation earlier this month.  No further history could be obtained at this time. Review of Systems: unable to review all systems due to the inability of the patient to answer questions. Past Medical History:  Diagnosis Date   Atrial fibrillation ([place])    Coronary atherosclerosis of native coronary artery    Nonobstructive   Dyslipidemia    PSVT (paroxysmal supraventricular tachycardia)    TIA (transient ischemic attack)    Type 2 diabetes mellitus ([place])    Diet controlled   Past Surgical History:  Procedure Laterality Date   ABDOMINAL HYSTERECTOMY     CARPAL TUNNEL RELEASE     x's 2   CHOLECYSTECTOMY     REPLACEMENT TOTAL KNEE  2002 & 2012   SHOULDER SURGERY  2001   Social History:  reports that she has never smoked. She has never used smokeless tobacco. She reports that she does not drink alcohol and does not use drugs.  Allergies  Allergen Reactions   Macrobid  [Nitrofurantoin] Diarrhea and Nausea And Vomiting   Toprol Xl [Metoprolol Succinate] Other (See Comments)    Hair loss    Family History  Problem Relation Age of Onset   Coronary artery disease Mother     Prior to Admission medications   Medication Sig Start Date End Date Taking? Authorizing Provider  acetaminophen (TYLENOL) 325 MG tablet Take 2 tablets (650 mg total) by mouth every 6 (six) hours as needed for mild pain, fever or headache (or Fever >/= 101). 10/08/22  Yes Emokpae, Courage, MD  acidophilus (RISAQUAD) CAPS capsule Take 1 capsule by mouth daily.   Yes [provider]  apixaban (ELIQUIS) 2.5 MG TABS tablet Take 1 tablet (2.5 mg total) by mouth 2 (two) times daily. 11/26/22 12/26/22 Yes Shahmehdi, Valeria Batman, MD  atenolol (TENORMIN) 50 MG tablet Take 1 tablet (50 mg total) by mouth 2 (two) times daily. 12/03/22 11/28/23 Yes Satira Sark, MD  D-MANNOSE PO Take 1 capsule by mouth with breakfast, with lunch, and with evening meal.   Yes [provider]  diltiazem (CARDIZEM CD) 120 MG 24 hr capsule Take 1 capsule (120 mg total) by mouth daily. 12/03/22 11/28/23 Yes Satira Sark, MD  metFORMIN (GLUCOPHAGE) 500 MG tablet Take 500-1,000 mg by mouth See admin instructions. 1000 mg in the morning, 500 mg at bedtime.   Yes [provider]  Multiple Vitamin (MULTIVITAMIN) LIQD Take 15 mLs by mouth daily. 11/27/22  Yes Shahmehdi, Seyed A, MD  nitrofurantoin (MACRODANTIN) 50 MG capsule Take  50 mg by mouth daily. 12/04/22  Yes [provider]    Physical Exam: Vitals:   12/05/22 2200 12/05/22 2229 12/05/22 2230 12/05/22 2343  BP: 120/66  105/68 118/64  Pulse: 98  93 (!) 109  Resp: (!) 23  (!) 22 19  Temp:  99.9 F (37.7 C)  99 F (37.2 C)  TempSrc:  Axillary  Oral  SpO2: 99%  100% 94%  Weight:      Height:       1.  General: Patient lying supine in bed,  no acute distress   2. Psychiatric: Nonverbal  3. Neurologic: Nonverbal and not following  commands, protecting airway, moans to palpation of anything   4. HEENMT:  Head is atraumatic, normocephalic, pupils reactive to light, neck is supple, trachea is midline, mucous membranes are moist   5. Respiratory : Lungs are clear to auscultation bilaterally without wheezing, rhonchi, rales, no cyanosis, no increase in work of breathing or accessory muscle use   6. Cardiovascular : Heart rate tachycardic, rhythm is irregular regular, no murmurs, rubs or gallops, no peripheral edema, peripheral pulses palpated   7. Gastrointestinal:  Abdomen is soft, nondistended, nontender to palpation bowel sounds active, no masses or organomegaly palpated   8. Skin:  Skin is warm, dry and intact without rashes, acute lesions, or ulcers on limited exam   9.Musculoskeletal:  No acute deformities or trauma, no asymmetry in tone, no peripheral edema, peripheral pulses palpated, no tenderness to palpation in the extremities  Data Reviewed: In the ED Temp 98.2-98.9, heart rate 69-133, respiratory rate 21-30, blood pressure 103/61-125/102, satting 97% No leukocytosis Hypokalemia 3.3 Hypomagnesemia 1.2 Albumin 3.0 Lipase 161 AST 182, ALT 53 Trope 6, 6 UA is indicative of UTI CT abdomen pelvis shows possible reflux with cystitis and pyelonephritis as well as reactive lymphadenopathy CT head shows no acute intracranial abnormality Chest x-ray shows no active disease Heart rate is 121 on the EKG with rhythm A-fib with RVR Patient was started on Rocephin but then after reviewing micro started on Cipro, labetalol, mag sulfate, potassium, and 1-1/2 L bolus given in the ED Admission requested for acute metabolic encephalopathy   Assessment and Plan: * Atrial fibrillation with RVR (HCC) - Heart rate up to 133 - Heart rate improved with fluids - Likely by infection - Continue atenolol and diltiazem - Continue to monitor  Sepsis secondary to UTI (HCC) - Tachycardia, tachypnea, with acute metabolic  encephalopathy - Suspected source is UTI - UA is indicative of UTI, CT abdomen pelvis shows cystitis with pyelonephritis and reactive lymphadenopathy - Previous micro shows multiple resistances, but susceptible to Cipro - Continue Cipro - Urine culture pending - Unfortunately blood cultures were not drawn prior to antibiotics - Continue monitor  Acute metabolic encephalopathy - Likely due to infection - CT head showed no acute intracranial abnormalities - UA shows probable UTI - Based on previous micro - Urine culture pending - Continue to monitor  Hypokalemia - Potassium 3.3 - 20 mEq given in the ED - 20 mEq administered at admission  Transaminitis - Trending down from previous blood draw - AST previously 320, now 182 - ALT previously 157, now 53 - 1.5 L bolus given in the ED - Trend in the a.m.  Hypoalbuminemia - Likely due to poor p.o. intake - As patient's mentation clears encourage nutrient dense p.o. intake - Trend in the a.m.  Acute pyelonephritis - As shown on CT - Continue Cipro - See plan for UTI  Hypomagnesemia -  2 g mag sulfate administered in ED - Trend in the a.m.  Essential hypertension, benign - Continue atenolol and diltiazem      Advance Care Planning:   Code Status: Full Code  Consults: None  Family Communication: No family at bedside  Severity of Illness: The appropriate patient status for this patient is INPATIENT. Inpatient status is judged to be reasonable and necessary in order to provide the required intensity of service to ensure the patient's safety. The patient's presenting symptoms, physical exam findings, and initial radiographic and laboratory data in the context of their chronic comorbidities is felt to place them at high risk for further clinical deterioration. Furthermore, it is not anticipated that the patient will be medically stable for discharge from the hospital within 2 midnights of admission.   * I certify that at the  point of admission it is my clinical judgment that the patient will require inpatient hospital care spanning beyond 2 midnights from the point of admission due to high intensity of service, high risk for further deterioration and high frequency of surveillance required.*  Author: Rolla Plate, DO 12/06/2022 4:43 AM  For on call review [email or web address].

## 2022-12-06 NOTE — Assessment & Plan Note (Signed)
-  Potassium 3.3 - 20 mEq given in the ED - 20 mEq administered at admission

## 2022-12-07 DIAGNOSIS — I4891 Unspecified atrial fibrillation: Secondary | ICD-10-CM | POA: Diagnosis not present

## 2022-12-07 LAB — COMPREHENSIVE METABOLIC PANEL
ALT: 68 U/L — ABNORMAL HIGH (ref 0–44)
AST: 75 U/L — ABNORMAL HIGH (ref 15–41)
Albumin: 2.4 g/dL — ABNORMAL LOW (ref 3.5–5.0)
Alkaline Phosphatase: 72 U/L (ref 38–126)
Anion gap: 8 (ref 5–15)
BUN: 41 mg/dL — ABNORMAL HIGH (ref 8–23)
CO2: 22 mmol/L (ref 22–32)
Calcium: 7.9 mg/dL — ABNORMAL LOW (ref 8.9–10.3)
Chloride: 104 mmol/L (ref 98–111)
Creatinine, Ser: 0.98 mg/dL (ref 0.44–1.00)
GFR, Estimated: 54 mL/min — ABNORMAL LOW (ref >60)
Glucose, Bld: 105 mg/dL — ABNORMAL HIGH (ref 70–99)
Potassium: 4.2 mmol/L (ref 3.5–5.1)
Sodium: 134 mmol/L — ABNORMAL LOW (ref 135–145)
Total Bilirubin: 0.4 mg/dL (ref 0.3–1.2)
Total Protein: 5.2 g/dL — ABNORMAL LOW (ref 6.5–8.1)

## 2022-12-07 LAB — GLUCOSE, CAPILLARY
Glucose-Capillary: 106 mg/dL — ABNORMAL HIGH (ref 70–99)
Glucose-Capillary: 199 mg/dL — ABNORMAL HIGH (ref 70–99)

## 2022-12-07 LAB — CBC WITH DIFFERENTIAL/PLATELET
Abs Immature Granulocytes: 0.04 10*3/uL (ref 0.00–0.07)
Basophils Absolute: 0 10*3/uL (ref 0.0–0.1)
Basophils Relative: 1 %
Eosinophils Absolute: 0.1 10*3/uL (ref 0.0–0.5)
Eosinophils Relative: 1 %
HCT: 28.5 % — ABNORMAL LOW (ref 36.0–46.0)
Hemoglobin: 9.2 g/dL — ABNORMAL LOW (ref 12.0–15.0)
Immature Granulocytes: 1 %
Lymphocytes Relative: 5 %
Lymphs Abs: 0.4 10*3/uL — ABNORMAL LOW (ref 0.7–4.0)
MCH: 29.3 pg (ref 26.0–34.0)
MCHC: 32.3 g/dL (ref 30.0–36.0)
MCV: 90.8 fL (ref 80.0–100.0)
Monocytes Absolute: 0.3 10*3/uL (ref 0.1–1.0)
Monocytes Relative: 5 %
Neutro Abs: 6.7 10*3/uL (ref 1.7–7.7)
Neutrophils Relative %: 87 %
Platelets: 382 10*3/uL (ref 150–400)
RBC: 3.14 MIL/uL — ABNORMAL LOW (ref 3.87–5.11)
RDW: 16 % — ABNORMAL HIGH (ref 11.5–15.5)
WBC: 7.6 10*3/uL (ref 4.0–10.5)
nRBC: 0 % (ref 0.0–0.2)

## 2022-12-07 LAB — MAGNESIUM: Magnesium: 2.3 mg/dL (ref 1.7–2.4)

## 2022-12-07 MED ORDER — SULFAMETHOXAZOLE-TRIMETHOPRIM 800-160 MG PO TABS: 1.0000 | ORAL_TABLET | Freq: Two times a day (BID) | ORAL | 0 refills | Status: AC

## 2022-12-07 NOTE — Discharge Summary (Signed)
Physician Discharge Summary  [name] KDX:[id number] DOB: [birth year] DOA: 12/05/2022  PCP: Manon Hilding, MD  Admit date: 12/05/2022 Discharge date: 12/07/2022 30 Day Unplanned Readmission Risk Score    Flowsheet Row ED to Hosp-Admission (Current) from 12/05/2022 in [facility]  30 Day Unplanned Readmission Risk Score (%) 34.01 Filed at 12/07/2022 0801       This score is the patient's risk of an unplanned readmission within 30 days of being discharged (0 -100%). The score is based on dignosis, age, lab data, medications, orders, and past utilization.   Low:  0-14.9   Medium: 15-21.9   High: 22-29.9   Extreme: 30 and above          Admitted From: Home Disposition: Home  Recommendations for Outpatient Follow-up:  Follow up with PCP in 1-2 weeks Please obtain BMP/CBC in one week Please follow up with your PCP on the following pending results: Unresulted Labs (From admission, onward)     Start     Ordered   12/07/22 0500  Comprehensive metabolic panel  Daily,   R      12/06/22 1119   12/06/22 0810  C Difficile Quick Screen w PCR reflex  (C Difficile quick screen w PCR reflex panel )  Once, for 24 hours,   TIMED       References:    CDiff Information Tool   12/06/22 0809              Home Health: Yes Equipment/Devices: None  Discharge Condition: Stable CODE STATUS: DNR Diet recommendation: Cardiac  Subjective: Patient seen and event.  Significant improvement compared to yesterday, she is fully alert and oriented today.  Family at the bedside also witnessed that.  They are agreeable with the discharge plan today.  I spoke to the son and daughter-in-law at the bedside as well as spoke to patient's primary caregiver her daughter over the phone as well.  Brief/Interim Summary: [name] is a 89 y.o. female with medical history significant of atrial fibrillation, coronary atherosclerosis of native artery, hyperlipidemia, paroxysmal supraventricular  tachycardia, type 2 diabetes mellitus diet-controlled, and more presented to the ED with a chief complaint of altered mental status. Chart review reveals that there was concern for nausea and vomiting and possible UTI.  Family said this is how she usually presents with UTI.  Patient has had multiple recurrent UTIs.  Attempted outpatient Macrobid for prophylaxis, but patient was not able to tolerate it.  Patient had reported suprapubic pain.  Patient presented with agitation confusion.  She had a similar presentation earlier this month.  No further history could be obtained at this time.  She was admitted to hospital service with the following details.   Atrial fibrillation with RVR ([place]): She had high rates, her home beta-blocker and calcium blockers were resumed and RVR was controlled.  Cardiology also saw her.   Severe sepsis secondary to UTI and acute pyelonephritis (HCC)/bilateral hydronephrosis and hydroureter: -Patient met severe sepsis criteria based on tachycardia, tachypnea, with acute metabolic encephalopathy. Suspected source is UTI.  She was continued on Rocephin.  Urine culture was unremarkable.  I have reviewed all the urine culture reports from the past and she has mostly grown Klebsiella pneumonia which is sensitive to Bactrim DS and sometimes resistant to most of the antibiotics but Macrobid but patient has not tolerated Macrobid in the past so the only choice we are left with is Bactrim DS which I will send her home  with for 5 days.  I have discussed this with family.   Acute metabolic encephalopathy in the setting of dementia: - Likely due to infection, encephalopathy has resolved, she is fully alert and oriented now.   Hypokalemia: Resolved.   Hypomagnesemia: Resolved.   Transaminitis: Slightly elevated LFTs compared to yesterday, likely in the setting of sepsis.  Monitor closely.   Hypoalbuminemia: - Likely due to poor p.o. intake.  Family to encourage p.o. intake.    Essential hypertension, benign: Resume home medications.  Discharge plan was discussed with patient and/or family member and they verbalized understanding and agreed with it.  Discharge Diagnoses:  Principal Problem:   Atrial fibrillation with RVR ([place]) Active Problems:   Acute metabolic encephalopathy   Sepsis secondary to UTI [facility])   Essential hypertension, benign   Hypomagnesemia   Acute pyelonephritis   Hypoalbuminemia   Transaminitis   Hypokalemia    Discharge Instructions   Allergies as of 12/07/2022       Reactions   Macrobid [nitrofurantoin] Diarrhea, Nausea And Vomiting   Toprol Xl [metoprolol Succinate] Other (See Comments)   Hair loss        Medication List     STOP taking these medications    nitrofurantoin 50 MG capsule Commonly known as: MACRODANTIN       TAKE these medications    acetaminophen 325 MG tablet Commonly known as: TYLENOL Take 2 tablets (650 mg total) by mouth every 6 (six) hours as needed for mild pain, fever or headache (or Fever >/= 101).   acidophilus Caps capsule Take 1 capsule by mouth daily.   apixaban 2.5 MG Tabs tablet Commonly known as: ELIQUIS Take 1 tablet (2.5 mg total) by mouth 2 (two) times daily.   atenolol 50 MG tablet Commonly known as: TENORMIN Take 1 tablet (50 mg total) by mouth 2 (two) times daily.   D-MANNOSE PO Take 1 capsule by mouth with breakfast, with lunch, and with evening meal.   diltiazem 120 MG 24 hr capsule Commonly known as: Cardizem CD Take 1 capsule (120 mg total) by mouth daily.   metFORMIN 500 MG tablet Commonly known as: GLUCOPHAGE Take 500-1,000 mg by mouth See admin instructions. 1000 mg in the morning, 500 mg at bedtime.   multivitamin Liqd Take 15 mLs by mouth daily.   sulfamethoxazole-trimethoprim 800-160 MG tablet Commonly known as: BACTRIM DS Take 1 tablet by mouth 2 (two) times daily for 5 days.        Follow-up Information     Sasser, Silvestre Moment, MD Follow up in 1  week(s).   Specialty: Family Medicine Contact information: [place] [address] [phone]                Allergies  Allergen Reactions   Macrobid [Nitrofurantoin] Diarrhea and Nausea And Vomiting   Toprol Xl [Metoprolol Succinate] Other (See Comments)    Hair loss    Consultations: Cardiology   Procedures/Studies: CT Head Wo Contrast  Result Date: 12/05/2022 CLINICAL DATA:  Delirium. Patient is being treated for recurrent urinary tract infections. EXAM: CT HEAD WITHOUT CONTRAST TECHNIQUE: Contiguous axial images were obtained from the base of the skull through the vertex without intravenous contrast. RADIATION DOSE REDUCTION: This exam was performed according to the departmental dose-optimization program which includes automated exposure control, adjustment of the mA and/or kV according to patient size and/or use of iterative reconstruction technique. COMPARISON:  10/03/2022 FINDINGS: Brain: Diffuse cerebral atrophy. Ventricular dilatation consistent with central atrophy. Low-attenuation  changes in the deep white matter consistent with small vessel ischemia. No abnormal extra-axial fluid collections. No mass effect or midline shift. Gray-white matter junctions are distinct. Basal cisterns are not effaced. No acute intracranial hemorrhage. Vascular: Intracranial arterial vascular calcifications. Skull: Calvarium appears intact. Sinuses/Orbits: Paranasal sinuses and mastoid air cells are clear. Other: None. IMPRESSION: No acute intracranial abnormalities. Chronic atrophy and small vessel ischemic changes. Electronically Signed   By: Lucienne Capers M.D.   On: 12/05/2022 19:57   CT ABDOMEN PELVIS W CONTRAST  Result Date: 12/05/2022 CLINICAL DATA:  Acute nonlocalized abdominal pain. Chronic urinary tract infections on treatment. Generalized weakness and atrial fibrillation. EXAM: CT ABDOMEN AND PELVIS WITH CONTRAST TECHNIQUE: Multidetector CT imaging of the abdomen and  pelvis was performed using the standard protocol following bolus administration of intravenous contrast. RADIATION DOSE REDUCTION: This exam was performed according to the departmental dose-optimization program which includes automated exposure control, adjustment of the mA and/or kV according to patient size and/or use of iterative reconstruction technique. CONTRAST:  53mL OMNIPAQUE IOHEXOL 300 MG/ML  SOLN COMPARISON:  11/23/2022 FINDINGS: Lower chest: Atelectasis in the lung bases. Small left pleural effusion. Coronary artery calcifications. Prominent mediastinal lymph nodes are nonspecific but likely reactive. Hepatobiliary: No focal liver abnormality is seen. Status post cholecystectomy. No biliary dilatation. Pancreas: Unremarkable. No pancreatic ductal dilatation or surrounding inflammatory changes. Spleen: Normal in size without focal abnormality. Adrenals/Urinary Tract: No adrenal gland nodules. Renal nephrograms are symmetrical without focal lesion. There is bilateral hydronephrosis and hydroureter. No ureteral stones are demonstrated. Changes likely to represent reflux or stasis. Mild wall thickening and stranding around the ureters may indicate pyelonephritis. Diffuse wall thickening of the bladder also may indicate cystitis. No bladder filling defects. Stomach/Bowel: Stomach, small bowel, and colon are not abnormally distended. No wall thickening or inflammatory changes. Appendix is not identified. Vascular/Lymphatic: Normal caliber abdominal aorta with diffuse calcification. Moderately prominent retroperitoneal lymph nodes without pathologic enlargement. These are nonspecific but likely to be reactive. Reproductive: Uterus appears surgically absent. Ovaries are identified without abnormal mass or enlargement. Other: No free air or free fluid in the abdomen. Abdominal wall musculature appears intact. Musculoskeletal: Degenerative changes in the spine. Lumbar scoliosis convex towards the left. IMPRESSION:  1. Bilateral hydronephrosis and hydroureter suggesting reflux or stasis. No obstructing stones are demonstrated. 2. Wall thickening of the bladder and wall thickening of the ureters with periureteral stranding suggesting cystitis with pyelonephritis. 3. Aortic atherosclerosis. 4. Prominent lymph nodes in the mediastinum and retroperitoneum without pathologic enlargement, likely to be reactive. Electronically Signed   By: Lucienne Capers M.D.   On: 12/05/2022 19:54   DG Chest Portable 1 View  Result Date: 12/05/2022 CLINICAL DATA:  Pain.  Chronic UTIs EXAM: PORTABLE CHEST 1 VIEW COMPARISON:  Chest x-ray 11/23/2022 FINDINGS: Interval removal of a PICC line. The heart and mediastinal contours are unchanged. Aortic calcification. Low lung volumes. Elevated right hemidiaphragm. No focal consolidation. Chronic coarsened markings with no overt pulmonary edema. No pleural effusion. No pneumothorax. No acute osseous abnormality. IMPRESSION: 1. No active disease in a patient with elevated right hemidiaphragm. 2.  Aortic Atherosclerosis (ICD10-I70.0). Electronically Signed   By: Iven Finn M.D.   On: 12/05/2022 18:37   DG CHEST PORT 1 VIEW  Result Date: 11/23/2022 CLINICAL DATA:  Central line placement. EXAM: PORTABLE CHEST 1 VIEW COMPARISON:  11/23/2022. FINDINGS: Heart is enlarged and the mediastinal contour stable. There is atherosclerotic calcification of the aorta. The distal tip of a right PICC line terminates at  the cavoatrial junction. Lung volumes are low and there is chronic elevation of the right diaphragm. Interstitial prominence is present bilaterally and mild patchy airspace disease is noted at the lung bases. No effusion or pneumothorax. Cholecystectomy clips are noted in the right upper quadrant. No acute osseous abnormality. IMPRESSION: 1. Right PICC line terminates at the cavoatrial junction. 2. Cardiomegaly. 3. Interstitial prominence bilaterally and patchy airspace disease at the lung bases,  possible atelectasis, edema or infiltrate. Electronically Signed   By: Brett Fairy M.D.   On: 11/23/2022 20:10   [place] EKG SITE RITE  Result Date: 11/23/2022 If Site Rite image not attached, placement could not be confirmed due to current cardiac rhythm.  CT Abdomen Pelvis W Contrast  Result Date: 11/23/2022 CLINICAL DATA:  Abdominal pain, sepsis. EXAM: CT ABDOMEN AND PELVIS WITH CONTRAST TECHNIQUE: Multidetector CT imaging of the abdomen and pelvis was performed using the standard protocol following bolus administration of intravenous contrast. RADIATION DOSE REDUCTION: This exam was performed according to the departmental dose-optimization program which includes automated exposure control, adjustment of the mA and/or kV according to patient size and/or use of iterative reconstruction technique. CONTRAST:  6mL OMNIPAQUE IOHEXOL 300 MG/ML  SOLN COMPARISON:  08/18/2022. FINDINGS: Lower chest: Mild dependent atelectasis in the lower lobes. Heart is enlarged. Atherosclerotic calcification of the aorta, aortic valve and coronary arteries. Enlarged pulmonic trunk. No pericardial or pleural effusion. Distal esophagus is grossly unremarkable. Hepatobiliary: Low-attenuation lesions in the liver measure up to 1.7 cm in the left hepatic lobe, similar but too small to characterize. Liver is otherwise unremarkable. Cholecystectomy. No unexpected biliary ductal dilatation. Pancreas: Negative. Spleen: Negative. Adrenals/Urinary Tract: Bilateral adrenal nodularity, unchanged. No specific follow-up necessary. Right kidney is unremarkable. Subcentimeter low-attenuation lesion in the left kidney, too small to characterize. No specific follow-up necessary. Ureters are mildly dilated, left greater than right. Diffuse bladder wall thickening with perivesical inflammatory haziness. Stomach/Bowel: Tiny hiatal hernia. Duodenal diverticula. Stomach, small bowel, appendix and colon are otherwise unremarkable. Vascular/Lymphatic:  Atherosclerotic calcification of the aorta. No pathologically enlarged lymph nodes. Reproductive: Hysterectomy. Simple appearing 1.9 cm left ovarian cyst. No follow-up imaging recommended. Note: This recommendation does not apply to premenarchal patients and to those with increased risk (genetic, family history, elevated tumor markers or other high-risk factors) of ovarian cancer. Reference: JACR 2020 Feb; 17(2):248-254. Other: No free air or free fluid. Mesenteries and peritoneum are unremarkable. Musculoskeletal: Degenerative changes in the spine. Levoconvex scoliosis. Minimal retrolisthesis of L2 on L3 with minimal grade 1 anterolisthesis L4 on L5, all likely degenerative in etiology. IMPRESSION: 1. Mild bilateral ureteral dilatation with marked bladder wall thickening and perivesical inflammatory haziness, findings suggesting cystitis. 2. Aortic atherosclerosis (ICD10-I70.0). Coronary artery calcification. 3. Enlarged pulmonic trunk, indicative of pulmonary arterial hypertension. Electronically Signed   By: Lorin Picket M.D.   On: 11/23/2022 10:02   DG Chest Port 1 View  Result Date: 11/23/2022 CLINICAL DATA:  Questionable sepsis EXAM: PORTABLE CHEST 1 VIEW COMPARISON:  10/03/2022 FINDINGS: Retrocardiac opacity. Low volume chest. No edema, effusion, or pneumothorax. Normal heart size and mediastinal contours. IMPRESSION: Retrocardiac opacity suspicious for pneumonia Electronically Signed   By: Jorje Guild M.D.   On: 11/23/2022 06:52     Discharge Exam: Vitals:   12/07/22 0205 12/07/22 0436  BP: 100/60 107/61  Pulse: 76 78  Resp: 18 18  Temp: 98.6 F (37 C) 98.7 F (37.1 C)  SpO2: 97% 98%   Vitals:   12/06/22 2142 12/06/22 2225 12/07/22 0205 12/07/22 0436  BP: 94/60 98/60 100/60 107/61  Pulse: 75  76 78  Resp: 20  18 18   Temp: 98.7 F (37.1 C)  98.6 F (37 C) 98.7 F (37.1 C)  TempSrc:      SpO2: 95%  97% 98%  Weight:      Height:        General: Pt is alert, awake, not in  acute distress Cardiovascular: Irregularly irregular RR, S1/S2 +, no rubs, no gallops Respiratory: CTA bilaterally, no wheezing, no rhonchi Abdominal: Soft, NT, ND, bowel sounds + Extremities: no edema, no cyanosis    The results of significant diagnostics from this hospitalization (including imaging, microbiology, ancillary and laboratory) are listed below for reference.     Microbiology: Recent Results (from the past 240 hour(s))  Resp panel by RT-PCR (RSV, Flu A&B, Covid) Anterior Nasal Swab     Status: None   Collection Time: 12/05/22  5:31 PM   Specimen: Anterior Nasal Swab  Result Value Ref Range Status   SARS Coronavirus 2 by RT PCR NEGATIVE NEGATIVE Final    Comment: (NOTE) SARS-CoV-2 target nucleic acids are NOT DETECTED.  The SARS-CoV-2 RNA is generally detectable in upper respiratory specimens during the acute phase of infection. The lowest concentration of SARS-CoV-2 viral copies this assay can detect is 138 copies/mL. A negative result does not preclude SARS-Cov-2 infection and should not be used as the sole basis for treatment or other patient management decisions. A negative result may occur with  improper specimen collection/handling, submission of specimen other than nasopharyngeal swab, presence of viral mutation(s) within the areas targeted by this assay, and inadequate number of viral copies(<138 copies/mL). A negative result must be combined with clinical observations, patient history, and epidemiological information. The expected result is Negative.  Fact Sheet for Patients:  EntrepreneurPulse.com.au  Fact Sheet for Healthcare Providers:  IncredibleEmployment.be  This test is no t yet approved or cleared by the [place] FDA and  has been authorized for detection and/or diagnosis of SARS-CoV-2 by FDA under an Emergency Use Authorization (EUA). This EUA will remain  in effect (meaning this test can be used) for the  duration of the COVID-19 declaration under Section 564(b)(1) of the Act, 21 U.S.C.section 360bbb-3(b)(1), unless the authorization is terminated  or revoked sooner.       Influenza A by PCR NEGATIVE NEGATIVE Final   Influenza B by PCR NEGATIVE NEGATIVE Final    Comment: (NOTE) The Xpert Xpress SARS-CoV-2/FLU/RSV plus assay is intended as an aid in the diagnosis of influenza from Nasopharyngeal swab specimens and should not be used as a sole basis for treatment. Nasal washings and aspirates are unacceptable for Xpert Xpress SARS-CoV-2/FLU/RSV testing.  Fact Sheet for Patients: EntrepreneurPulse.com.au  Fact Sheet for Healthcare Providers: IncredibleEmployment.be  This test is not yet approved or cleared by the [place] FDA and has been authorized for detection and/or diagnosis of SARS-CoV-2 by FDA under an Emergency Use Authorization (EUA). This EUA will remain in effect (meaning this test can be used) for the duration of the COVID-19 declaration under Section 564(b)(1) of the Act, 21 U.S.C. section 360bbb-3(b)(1), unless the authorization is terminated or revoked.     Resp Syncytial Virus by PCR NEGATIVE NEGATIVE Final    Comment: (NOTE) Fact Sheet for Patients: EntrepreneurPulse.com.au  Fact Sheet for Healthcare Providers: IncredibleEmployment.be  This test is not yet approved or cleared by the [place] FDA and has been authorized for detection and/or diagnosis of SARS-CoV-2 by FDA under an Emergency Use Authorization (  EUA). This EUA will remain in effect (meaning this test can be used) for the duration of the COVID-19 declaration under Section 564(b)(1) of the Act, 21 U.S.C. section 360bbb-3(b)(1), unless the authorization is terminated or revoked.  Performed at [facility], [address] [place], [place] [address]   Urine Culture     Status: None   Collection Time: 12/05/22  5:31 PM    Specimen: Urine, Clean Catch  Result Value Ref Range Status   Specimen Description   Final    URINE, CLEAN CATCH Performed at [facility], [address], [place], [place] [address]    Special Requests   Final    NONE Performed at [facility], [address], [place], [place] [address]    Culture   Final    NO GROWTH Performed at [facility], [place] [address], [place],  [address]    Report Status 12/06/2022 FINAL  Final     Labs: BNP (last 3 results) No results for input(s): "BNP" in the last 8760 hours. Basic Metabolic Panel: Recent Labs  Lab 12/05/22 1759 12/06/22 0417 12/06/22 1203 12/06/22 1735 12/07/22 0416  NA 133* 134*  --   --  134*  K 3.3* 3.8  --   --  4.2  CL 99 104  --   --  104  CO2 20* 21*  --   --  22  GLUCOSE 95 143*  --   --  105*  BUN 20 23  --   --  41*  CREATININE 0.72 0.81  --   --  0.98  CALCIUM 8.7* 7.8*  --   --  7.9*  MG 1.2* 1.1* 1.2* 2.4 2.3   Liver Function Tests: Recent Labs  Lab 12/05/22 1759 12/06/22 0417 12/07/22 0416  AST 182* 187* 75*  ALT 53* 97* 68*  ALKPHOS 68 68 72  BILITOT 1.5* 0.9 0.4  PROT 6.1* 5.2* 5.2*  ALBUMIN 3.0* 2.5* 2.4*   Recent Labs  Lab 12/05/22 1759  LIPASE 161*   No results for input(s): "AMMONIA" in the last 168 hours. CBC: Recent Labs  Lab 12/05/22 1759 12/06/22 0417 12/07/22 0416  WBC 10.7* 16.9* 7.6  NEUTROABS 10.4* 16.2* 6.7  HGB 10.6* 8.8* 9.2*  HCT 33.0* 26.7* 28.5*  MCV 92.4 90.2 90.8  PLT 404* 410* 382   Cardiac Enzymes: No results for input(s): "CKTOTAL", "CKMB", "CKMBINDEX", "TROPONINI" in the last 168 hours. BNP: Invalid input(s): "POCBNP" CBG: Recent Labs  Lab 12/06/22 1112 12/06/22 1606 12/06/22 2146 12/07/22 0716  GLUCAP 137* 185* 87 106*   D-Dimer No results for input(s): "DDIMER" in the last 72 hours. Hgb A1c No results for input(s): "HGBA1C" in the last 72 hours. Lipid Profile No results for input(s): "CHOL", "HDL", "LDLCALC", "TRIG", "CHOLHDL",  "LDLDIRECT" in the last 72 hours. Thyroid function studies Recent Labs    12/06/22 0417  TSH 0.966   Anemia work up No results for input(s): "VITAMINB12", "FOLATE", "FERRITIN", "TIBC", "IRON", "RETICCTPCT" in the last 72 hours. Urinalysis    Component Value Date/Time   COLORURINE YELLOW 12/05/2022 [place] 12/05/2022 1731   LABSPEC 1.010 12/05/2022 1731   PHURINE 6.0 12/05/2022 1731   GLUCOSEU NEGATIVE 12/05/2022 1731   HGBUR NEGATIVE 12/05/2022 1731   BILIRUBINUR NEGATIVE 12/05/2022 1731   KETONESUR 5 (A) 12/05/2022 1731   PROTEINUR NEGATIVE 12/05/2022 1731   NITRITE NEGATIVE 12/05/2022 1731   LEUKOCYTESUR SMALL (A) 12/05/2022 1731   Sepsis Labs Recent Labs  Lab  12/05/22 1759 12/06/22 0417 12/07/22 0416  WBC 10.7* 16.9* 7.6   Microbiology Recent Results (from the past 240 hour(s))  Resp panel by RT-PCR (RSV, Flu A&B, Covid) Anterior Nasal Swab     Status: None   Collection Time: 12/05/22  5:31 PM   Specimen: Anterior Nasal Swab  Result Value Ref Range Status   SARS Coronavirus 2 by RT PCR NEGATIVE NEGATIVE Final    Comment: (NOTE) SARS-CoV-2 target nucleic acids are NOT DETECTED.  The SARS-CoV-2 RNA is generally detectable in upper respiratory specimens during the acute phase of infection. The lowest concentration of SARS-CoV-2 viral copies this assay can detect is 138 copies/mL. A negative result does not preclude SARS-Cov-2 infection and should not be used as the sole basis for treatment or other patient management decisions. A negative result may occur with  improper specimen collection/handling, submission of specimen other than nasopharyngeal swab, presence of viral mutation(s) within the areas targeted by this assay, and inadequate number of viral copies(<138 copies/mL). A negative result must be combined with clinical observations, patient history, and epidemiological information. The expected result is Negative.  Fact Sheet for  Patients:  EntrepreneurPulse.com.au  Fact Sheet for Healthcare Providers:  IncredibleEmployment.be  This test is no t yet approved or cleared by the [place] FDA and  has been authorized for detection and/or diagnosis of SARS-CoV-2 by FDA under an Emergency Use Authorization (EUA). This EUA will remain  in effect (meaning this test can be used) for the duration of the COVID-19 declaration under Section 564(b)(1) of the Act, 21 U.S.C.section 360bbb-3(b)(1), unless the authorization is terminated  or revoked sooner.       Influenza A by PCR NEGATIVE NEGATIVE Final   Influenza B by PCR NEGATIVE NEGATIVE Final    Comment: (NOTE) The Xpert Xpress SARS-CoV-2/FLU/RSV plus assay is intended as an aid in the diagnosis of influenza from Nasopharyngeal swab specimens and should not be used as a sole basis for treatment. Nasal washings and aspirates are unacceptable for Xpert Xpress SARS-CoV-2/FLU/RSV testing.  Fact Sheet for Patients: EntrepreneurPulse.com.au  Fact Sheet for Healthcare Providers: IncredibleEmployment.be  This test is not yet approved or cleared by the [place] FDA and has been authorized for detection and/or diagnosis of SARS-CoV-2 by FDA under an Emergency Use Authorization (EUA). This EUA will remain in effect (meaning this test can be used) for the duration of the COVID-19 declaration under Section 564(b)(1) of the Act, 21 U.S.C. section 360bbb-3(b)(1), unless the authorization is terminated or revoked.     Resp Syncytial Virus by PCR NEGATIVE NEGATIVE Final    Comment: (NOTE) Fact Sheet for Patients: EntrepreneurPulse.com.au  Fact Sheet for Healthcare Providers: IncredibleEmployment.be  This test is not yet approved or cleared by the [place] FDA and has been authorized for detection and/or diagnosis of SARS-CoV-2 by FDA under an Emergency  Use Authorization (EUA). This EUA will remain in effect (meaning this test can be used) for the duration of the COVID-19 declaration under Section 564(b)(1) of the Act, 21 U.S.C. section 360bbb-3(b)(1), unless the authorization is terminated or revoked.  Performed at [facility], [address], [place], [place] [address]   Urine Culture     Status: None   Collection Time: 12/05/22  5:31 PM   Specimen: Urine, Clean Catch  Result Value Ref Range Status   Specimen Description   Final    URINE, CLEAN CATCH Performed at [facility], [address] [place], [place] [address]    Special Requests  Final    NONE Performed at [facility], [address], [place], [place] [address]    Culture   Final    NO GROWTH Performed at [facility], [place] [address] [place], [place] [address]    Report Status 12/06/2022 FINAL  Final     Time coordinating discharge: Over 30 minutes  SIGNED:   Darliss Cheney, MD  [facility] 12/07/2022, 11:09 AM *Please note that this is a verbal dictation therefore any spelling or grammatical errors are due to the "[place] [employer]" system interpretation. If 7PM-7AM, please contact night-coverage [email or web address]

## 2022-12-07 NOTE — Evaluation (Signed)
Occupational Therapy Evaluation Patient Details Name: [name] MRN: [id number] DOB: 08-Feb-1927 Today's Date: 12/07/2022   History of Present Illness [name] is a 89 y.o. female with medical history significant of atrial fibrillation, coronary atherosclerosis of native artery, hyperlipidemia, paroxysmal supraventricular tachycardia, type 2 diabetes mellitus diet-controlled, and more presents the ED with a chief complaint of altered mental status.  Unfortunately, patient is not able to provide me with any history.  Chart review reveals that there was concern for nausea and vomiting and possible UTI.  Family said this is how she usually presents with UTI.  Patient has had multiple recurrent UTIs.  Attempted outpatient Macrobid for prophylaxis, but patient was not able to tolerate it.  Patient had reported suprapubic pain.  Patient presented with agitation confusion.  She had a similar presentation earlier this month.  No further history could be obtained at this time.   Clinical Impression   Pt appears to be near baseline levels for ADL's and functional mobility. Family present during evaluation and stated feeling comfortable taking pt home without any home health therapy services. Pt was able to ambulate in hall with min G to min A today. Pt was left in the chair with family present. Pt is not recommended for further acute OT services and will be discharged to care of nursing staff for remaining length of stay.       Recommendations for follow up therapy are one component of a multi-disciplinary discharge planning process, led by the attending physician.  Recommendations may be updated based on patient status, additional functional criteria and insurance authorization.   Follow Up Recommendations  No OT follow up     Assistance Recommended at Discharge Frequent or constant Supervision/Assistance  Patient can return home with the following A little help with walking and/or transfers;A lot of  help with bathing/dressing/bathroom;Assistance with cooking/housework;Assist for transportation;Direct supervision/assist for medications management;Help with stairs or ramp for entrance    Functional Status Assessment  Patient has not had a recent decline in their functional status  Equipment Recommendations  None recommended by OT    Recommendations for Other Services       Precautions / Restrictions Precautions Precautions: Fall Restrictions Weight Bearing Restrictions: No      Mobility Bed Mobility               General bed mobility comments: Seated in chair at start of eval.    Transfers Overall transfer level: Needs assistance Equipment used: Rolling walker (2 wheels) Transfers: Sit to/from Stand, Bed to chair/wheelchair/BSC Sit to Stand: Min assist     Step pivot transfers: Min guard, Min assist     General transfer comment: slow labored movement      Balance Overall balance assessment: Needs assistance Sitting-balance support: Feet supported, Bilateral upper extremity supported Sitting balance-Leahy Scale: Fair Sitting balance - Comments: seated in recliner.   Standing balance support: Reliant on assistive device for balance, Bilateral upper extremity supported Standing balance-Leahy Scale: Poor Standing balance comment: poor to fair with RW                           ADL either performed or assessed with clinical judgement   ADL Overall ADL's : Needs assistance/impaired             Lower Body Bathing: Maximal assistance       Lower Body Dressing: Maximal assistance   Toilet Transfer: Minimal assistance;Ambulation Toilet Transfer Details (indicate  cue type and reason): Partially simulated via sit to stand from chair, ambulation in hall, and return to chair. Toileting- Clothing Manipulation and Hygiene: Maximal assistance       Functional mobility during ADLs: Minimal assistance;Min guard General ADL Comments: Able to  ambulate >20 feet in hall.     Vision Baseline Vision/History: 1 Wears glasses Ability to See in Adequate Light: 0 Adequate Patient Visual Report: No change from baseline Vision Assessment?: No apparent visual deficits                Pertinent Vitals/Pain Pain Assessment Pain Assessment: No/denies pain     Hand Dominance Right   Extremity/Trunk Assessment Upper Extremity Assessment Upper Extremity Assessment: Defer to OT evaluation   Lower Extremity Assessment Lower Extremity Assessment: Generalized weakness   Cervical / Trunk Assessment Cervical / Trunk Assessment: Kyphotic   Communication Communication Communication: HOH   Cognition Arousal/Alertness: Awake/alert Behavior During Therapy: WFL for tasks assessed/performed, Flat affect Overall Cognitive Status: History of cognitive impairments - at baseline                                                        Home Living Family/patient expects to be discharged to:: Private residence Living Arrangements: Children Available Help at Discharge: Family;Available 24 hours/day Type of Home: House Home Access: Ramped entrance     Home Layout: One level     Bathroom Shower/Tub: Tub/shower unit;Sponge bathes at baseline   Bathroom Toilet: Handicapped height Bathroom Accessibility: Yes How Accessible: Accessible via wheelchair Home Equipment: Conservation officer, nature (2 wheels);Cane - single point;Transport chair;BSC/3in1;Shower seat;Hospital bed   Additional Comments: History in chart from recent visit. Has 24/7 assist.      Prior Functioning/Environment Prior Level of Function : Needs assist         Mobility (physical): Bed mobility;Transfers;Gait;Stairs   Mobility Comments: household ambulator using RW and assist ADLs Comments: assisted by family                      OT Goals(Current goals can be found in the care plan section) Acute Rehab OT Goals Patient Stated Goal: return  home OT Goal Formulation: With patient/family  OT Frequency:      Co-evaluation PT/OT/SLP Co-Evaluation/Treatment: Yes Reason for Co-Treatment: To address functional/ADL transfers PT goals addressed during session: Mobility/safety with mobility;Balance;Proper use of DME;Strengthening/ROM OT goals addressed during session: ADL's and self-care                       End of Session Equipment Utilized During Treatment: Gait belt;Rolling walker (2 wheels)  Activity Tolerance: Patient tolerated treatment well Patient left: in chair;with call bell/phone within reach;with family/visitor present  OT Visit Diagnosis: Unsteadiness on feet (R26.81);Other abnormalities of gait and mobility (R26.89);Muscle weakness (generalized) (M62.81)                Time: 8469-6295 OT Time Calculation (min): 19 min Charges:  OT General Charges $OT Visit: 1 Visit OT Evaluation $OT Eval Low Complexity: 1 Low  Evie Crumpler OT, MOT  Larey Seat 12/07/2022, 10:22 AM

## 2022-12-07 NOTE — Progress Notes (Signed)
Tele reviewed, afib rates well controlled. She is on her home atenolol 50mg  bid, diltiazem 120mg  daily. BP"s are tolerating this regimen. No additional cardiology recs at this time. Permanent afib with RVR in setting of urosepsis now afib well controlled on prior home regimen. Please call us back if needed   Carlyle Dolly MD

## 2022-12-07 NOTE — Evaluation (Signed)
Physical Therapy Evaluation Patient Details Name: [name] MRN: [id number] DOB: 1927-07-18 Today's Date: 12/07/2022  History of Present Illness  [name] is a 89 y.o. female with medical history significant of atrial fibrillation, coronary atherosclerosis of native artery, hyperlipidemia, paroxysmal supraventricular tachycardia, type 2 diabetes mellitus diet-controlled, and more presents the ED with a chief complaint of altered mental status.  Unfortunately, patient is not able to provide me with any history.  Chart review reveals that there was concern for nausea and vomiting and possible UTI.  Family said this is how she usually presents with UTI.  Patient has had multiple recurrent UTIs.  Attempted outpatient Macrobid for prophylaxis, but patient was not able to tolerate it.  Patient had reported suprapubic pain.  Patient presented with agitation confusion.  She had a similar presentation earlier this month.  No further history could be obtained at this time.   Clinical Impression  Patient limited for functional mobility as stated below secondary to BLE weakness, fatigue and poor standing balance. Patient seated in chair at beginning of session with family present. She requires min assist to power up to standing with RW due to general weakness. She demonstrates fair/poor standing balance with use of RW and ambulates with slow, labored cadence in room/hall. She returns to chair at end of session. Patient's family stating they will be able to take patient home and provide assistance to patient. They are not interested in any follow up PT services upon d/c at this time. Patient will benefit from continued physical therapy in hospital and recommended venue below to increase strength, balance, endurance for safe ADLs and gait.        Recommendations for follow up therapy are one component of a multi-disciplinary discharge planning process, led by the attending physician.  Recommendations may be  updated based on patient status, additional functional criteria and insurance authorization.  Follow Up Recommendations No PT follow up      Assistance Recommended at Discharge Frequent or constant Supervision/Assistance  Patient can return home with the following  A lot of help with walking and/or transfers;A lot of help with bathing/dressing/bathroom;Assistance with cooking/housework;Help with stairs or ramp for entrance    Equipment Recommendations None recommended by PT  Recommendations for Other Services       Functional Status Assessment Patient has had a recent decline in their functional status and demonstrates the ability to make significant improvements in function in a reasonable and predictable amount of time.     Precautions / Restrictions Precautions Precautions: Fall Restrictions Weight Bearing Restrictions: No      Mobility  Bed Mobility               General bed mobility comments: seated in chair at beginning of session    Transfers Overall transfer level: Needs assistance Equipment used: Rolling walker (2 wheels) Transfers: Sit to/from Stand Sit to Stand: Min assist           General transfer comment: slow, labored transfer to standing, cueing for sequencing    Ambulation/Gait Ambulation/Gait assistance: Min guard Gait Distance (Feet): 25 Feet Assistive device: Rolling walker (2 wheels) Gait Pattern/deviations: Decreased step length - left, Decreased stance time - right, Decreased stride length, Trunk flexed Gait velocity: slow     General Gait Details: slow, labored gait with RW  Stairs            Wheelchair Mobility    Modified Rankin (Stroke Patients Only)       Balance  Overall balance assessment: Needs assistance Sitting-balance support: Feet supported, No upper extremity supported Sitting balance-Leahy Scale: Fair Sitting balance - Comments: seated edge of chair   Standing balance support: Reliant on assistive  device for balance, Bilateral upper extremity supported Standing balance-Leahy Scale: Fair Standing balance comment: fair/poor with RW                             Pertinent Vitals/Pain Pain Assessment Pain Assessment: No/denies pain    Home Living Family/patient expects to be discharged to:: Private residence Living Arrangements: Children Available Help at Discharge: Family;Available 24 hours/day Type of Home: House Home Access: Ramped entrance       Home Layout: One level Home Equipment: Conservation officer, nature (2 wheels);Cane - single point;Transport chair;BSC/3in1;Shower seat;Hospital bed Additional Comments: History in chart from recent visit. Has 24/7 assist.    Prior Function Prior Level of Function : Needs assist         Mobility (physical): Bed mobility;Transfers;Gait;Stairs   Mobility Comments: household ambulator using RW and assist ADLs Comments: assisted by family     Hand Dominance   Dominant Hand: Right    Extremity/Trunk Assessment   Upper Extremity Assessment Upper Extremity Assessment: Defer to OT evaluation    Lower Extremity Assessment Lower Extremity Assessment: Generalized weakness    Cervical / Trunk Assessment Cervical / Trunk Assessment: Kyphotic  Communication   Communication: HOH  Cognition Arousal/Alertness: Awake/alert Behavior During Therapy: WFL for tasks assessed/performed, Flat affect Overall Cognitive Status: Within Functional Limits for tasks assessed                                          General Comments      Exercises     Assessment/Plan    PT Assessment Patient needs continued PT services  PT Problem List Decreased strength;Decreased activity tolerance;Decreased balance;Decreased mobility       PT Treatment Interventions DME instruction;Therapeutic exercise;Gait training;Balance training;Stair training;Neuromuscular re-education;Functional mobility training;Therapeutic  activities;Patient/family education    PT Goals (Current goals can be found in the Care Plan section)  Acute Rehab PT Goals Patient Stated Goal: return home PT Goal Formulation: With patient/family Time For Goal Achievement: 12/21/22 Potential to Achieve Goals: Good    Frequency Min 3X/week     Co-evaluation PT/OT/SLP Co-Evaluation/Treatment: Yes Reason for Co-Treatment: To address functional/ADL transfers PT goals addressed during session: Mobility/safety with mobility;Balance;Proper use of DME;Strengthening/ROM OT goals addressed during session: ADL's and self-care       AM-PAC PT "6 Clicks" Mobility  Outcome Measure Help needed turning from your back to your side while in a flat bed without using bedrails?: A Little Help needed moving from lying on your back to sitting on the side of a flat bed without using bedrails?: A Little Help needed moving to and from a bed to a chair (including a wheelchair)?: A Little Help needed standing up from a chair using your arms (e.g., wheelchair or bedside chair)?: A Little Help needed to walk in hospital room?: A Little Help needed climbing 3-5 steps with a railing? : A Lot 6 Click Score: 17    End of Session Equipment Utilized During Treatment: Gait belt Activity Tolerance: Patient tolerated treatment well;Patient limited by fatigue Patient left: in chair;with call bell/phone within reach;with family/visitor present Nurse Communication: Mobility status PT Visit Diagnosis: Muscle weakness (generalized) (M62.81);Unsteadiness on feet (  R26.81);Other abnormalities of gait and mobility (R26.89)    Time: 3846-6599 PT Time Calculation (min) (ACUTE ONLY): 19 min   Charges:   PT Evaluation $PT Eval Low Complexity: 1 Low          10:27 AM, 12/07/22 Mearl Latin PT, DPT Physical Therapist at [facility], [place]

## 2022-12-07 NOTE — TOC Transition Note (Signed)
Transition of Care [facility]) - CM/SW Discharge Note   Patient Details  Name: [name] MRN: [id number] Date of Birth: [birth year]  Transition of Care [facility]) CM/SW Contact:  Iona Beard, [place] Phone Number: 12/07/2022, 11:49 AM   Clinical Narrative:    CSW updated by MD that pt is medically ready for D/C home today. CSW updated Tommi Rumps with [name] that [facility] resumption orders have been placed. TOC signing off.   Final next level of care: Home w Home Health Services Barriers to Discharge: Continued Medical Work up   Patient Goals and CMS Choice CMS Medicare.gov Compare Post Acute Care list provided to:: Patient Choice offered to / list presented to : Patient  Discharge Placement                         Discharge Plan and Services Additional resources added to the After Visit Summary for                            HH Arranged: RN, PT, OT, Social Work [facility] Agency: [place] Date [facility] Agency Contacted: 12/07/22   Representative spoke with at [place]: [place] ([place]) Interventions [place]: No Food Insecurity (11/23/2022)  Housing: Low Risk  (11/23/2022)  Transportation Needs: No Transportation Needs (11/23/2022)  Utilities: Not At Risk (11/23/2022)  Financial Resource Strain: Low Risk  (07/13/2022)  Tobacco Use: Low Risk  (12/05/2022)     Readmission Risk Interventions    11/24/2022   10:52 AM  Readmission Risk Prevention Plan  Transportation Screening Complete  Medication Review ([place]) Complete  PCP or Specialist appointment within 3-5 days of discharge Not Complete  HRI or [place] Complete  SW Recovery Care/Counseling Consult Complete  [place] Not Complete

## 2022-12-07 NOTE — Plan of Care (Signed)
  Problem: Acute Rehab PT Goals(only PT should resolve) Goal: Patient Will Transfer Sit To/From Stand Outcome: Progressing Flowsheets (Taken 12/07/2022 1028) Patient will transfer sit to/from stand: with min guard assist Goal: Pt Will Transfer Bed To Chair/Chair To Bed Outcome: Progressing Flowsheets (Taken 12/07/2022 1028) Pt will Transfer Bed to Chair/Chair to Bed: min guard assist Goal: Pt Will Ambulate Outcome: Progressing Flowsheets (Taken 12/07/2022 1028) Pt will Ambulate:  50 feet  with min guard assist  with minimal assist  with least restrictive assistive device Goal: Pt/caregiver will Perform Home Exercise Program Outcome: Progressing Flowsheets (Taken 12/07/2022 1028) Pt/caregiver will Perform Home Exercise Program:  For increased strengthening  For improved balance  With Supervision, verbal cues required/provided  10:29 AM, 12/07/22 Mearl Latin PT, DPT Physical Therapist at [facility]

## 2022-12-10 DIAGNOSIS — N3000 Acute cystitis without hematuria: Secondary | ICD-10-CM | POA: Diagnosis not present

## 2022-12-10 DIAGNOSIS — D631 Anemia in chronic kidney disease: Secondary | ICD-10-CM | POA: Diagnosis not present

## 2022-12-10 DIAGNOSIS — E1122 Type 2 diabetes mellitus with diabetic chronic kidney disease: Secondary | ICD-10-CM | POA: Diagnosis not present

## 2022-12-10 DIAGNOSIS — I130 Hypertensive heart and chronic kidney disease with heart failure and stage 1 through stage 4 chronic kidney disease, or unspecified chronic kidney disease: Secondary | ICD-10-CM | POA: Diagnosis not present

## 2022-12-10 DIAGNOSIS — N1831 Chronic kidney disease, stage 3a: Secondary | ICD-10-CM | POA: Diagnosis not present

## 2022-12-10 DIAGNOSIS — I5032 Chronic diastolic (congestive) heart failure: Secondary | ICD-10-CM | POA: Diagnosis not present

## 2022-12-18 DIAGNOSIS — E1122 Type 2 diabetes mellitus with diabetic chronic kidney disease: Secondary | ICD-10-CM | POA: Diagnosis not present

## 2022-12-18 DIAGNOSIS — N1831 Chronic kidney disease, stage 3a: Secondary | ICD-10-CM | POA: Diagnosis not present

## 2022-12-18 DIAGNOSIS — I5032 Chronic diastolic (congestive) heart failure: Secondary | ICD-10-CM | POA: Diagnosis not present

## 2022-12-18 DIAGNOSIS — I130 Hypertensive heart and chronic kidney disease with heart failure and stage 1 through stage 4 chronic kidney disease, or unspecified chronic kidney disease: Secondary | ICD-10-CM | POA: Diagnosis not present

## 2022-12-18 DIAGNOSIS — N3000 Acute cystitis without hematuria: Secondary | ICD-10-CM | POA: Diagnosis not present

## 2022-12-18 DIAGNOSIS — F0390 Unspecified dementia without behavioral disturbance: Secondary | ICD-10-CM | POA: Diagnosis not present

## 2022-12-18 DIAGNOSIS — I2510 Atherosclerotic heart disease of native coronary artery without angina pectoris: Secondary | ICD-10-CM | POA: Diagnosis not present

## 2022-12-18 DIAGNOSIS — D631 Anemia in chronic kidney disease: Secondary | ICD-10-CM | POA: Diagnosis not present

## 2022-12-25 DIAGNOSIS — N1831 Chronic kidney disease, stage 3a: Secondary | ICD-10-CM | POA: Diagnosis not present

## 2022-12-25 DIAGNOSIS — D631 Anemia in chronic kidney disease: Secondary | ICD-10-CM | POA: Diagnosis not present

## 2022-12-25 DIAGNOSIS — N3000 Acute cystitis without hematuria: Secondary | ICD-10-CM | POA: Diagnosis not present

## 2022-12-25 DIAGNOSIS — I5032 Chronic diastolic (congestive) heart failure: Secondary | ICD-10-CM | POA: Diagnosis not present

## 2022-12-25 DIAGNOSIS — I130 Hypertensive heart and chronic kidney disease with heart failure and stage 1 through stage 4 chronic kidney disease, or unspecified chronic kidney disease: Secondary | ICD-10-CM | POA: Diagnosis not present

## 2022-12-25 DIAGNOSIS — E1122 Type 2 diabetes mellitus with diabetic chronic kidney disease: Secondary | ICD-10-CM | POA: Diagnosis not present

## 2022-12-26 DIAGNOSIS — E7801 Familial hypercholesterolemia: Secondary | ICD-10-CM | POA: Diagnosis not present

## 2022-12-26 DIAGNOSIS — N390 Urinary tract infection, site not specified: Secondary | ICD-10-CM | POA: Diagnosis not present

## 2022-12-26 DIAGNOSIS — E1122 Type 2 diabetes mellitus with diabetic chronic kidney disease: Secondary | ICD-10-CM | POA: Diagnosis not present

## 2022-12-26 DIAGNOSIS — E871 Hypo-osmolality and hyponatremia: Secondary | ICD-10-CM | POA: Diagnosis not present

## 2022-12-26 DIAGNOSIS — I4891 Unspecified atrial fibrillation: Secondary | ICD-10-CM | POA: Diagnosis not present

## 2022-12-26 DIAGNOSIS — G9341 Metabolic encephalopathy: Secondary | ICD-10-CM | POA: Diagnosis not present

## 2022-12-30 DIAGNOSIS — Z9181 History of falling: Secondary | ICD-10-CM | POA: Diagnosis not present

## 2022-12-30 DIAGNOSIS — Z96659 Presence of unspecified artificial knee joint: Secondary | ICD-10-CM | POA: Diagnosis not present

## 2022-12-30 DIAGNOSIS — N3090 Cystitis, unspecified without hematuria: Secondary | ICD-10-CM | POA: Diagnosis not present

## 2022-12-30 DIAGNOSIS — I2510 Atherosclerotic heart disease of native coronary artery without angina pectoris: Secondary | ICD-10-CM | POA: Diagnosis not present

## 2022-12-30 DIAGNOSIS — Z7901 Long term (current) use of anticoagulants: Secondary | ICD-10-CM | POA: Diagnosis not present

## 2022-12-30 DIAGNOSIS — Z7984 Long term (current) use of oral hypoglycemic drugs: Secondary | ICD-10-CM | POA: Diagnosis not present

## 2022-12-30 DIAGNOSIS — I130 Hypertensive heart and chronic kidney disease with heart failure and stage 1 through stage 4 chronic kidney disease, or unspecified chronic kidney disease: Secondary | ICD-10-CM | POA: Diagnosis not present

## 2022-12-30 DIAGNOSIS — A419 Sepsis, unspecified organism: Secondary | ICD-10-CM | POA: Diagnosis not present

## 2022-12-30 DIAGNOSIS — I700 Atherosclerosis of aorta: Secondary | ICD-10-CM | POA: Diagnosis not present

## 2022-12-30 DIAGNOSIS — F02811 Dementia in other diseases classified elsewhere, unspecified severity, with agitation: Secondary | ICD-10-CM | POA: Diagnosis not present

## 2022-12-30 DIAGNOSIS — D631 Anemia in chronic kidney disease: Secondary | ICD-10-CM | POA: Diagnosis not present

## 2022-12-30 DIAGNOSIS — G319 Degenerative disease of nervous system, unspecified: Secondary | ICD-10-CM | POA: Diagnosis not present

## 2022-12-30 DIAGNOSIS — I5032 Chronic diastolic (congestive) heart failure: Secondary | ICD-10-CM | POA: Diagnosis not present

## 2022-12-30 DIAGNOSIS — N16 Renal tubulo-interstitial disorders in diseases classified elsewhere: Secondary | ICD-10-CM | POA: Diagnosis not present

## 2022-12-30 DIAGNOSIS — Z8673 Personal history of transient ischemic attack (TIA), and cerebral infarction without residual deficits: Secondary | ICD-10-CM | POA: Diagnosis not present

## 2022-12-30 DIAGNOSIS — I2721 Secondary pulmonary arterial hypertension: Secondary | ICD-10-CM | POA: Diagnosis not present

## 2022-12-30 DIAGNOSIS — F05 Delirium due to known physiological condition: Secondary | ICD-10-CM | POA: Diagnosis not present

## 2022-12-30 DIAGNOSIS — F02818 Dementia in other diseases classified elsewhere, unspecified severity, with other behavioral disturbance: Secondary | ICD-10-CM | POA: Diagnosis not present

## 2022-12-30 DIAGNOSIS — N136 Pyonephrosis: Secondary | ICD-10-CM | POA: Diagnosis not present

## 2022-12-30 DIAGNOSIS — I480 Paroxysmal atrial fibrillation: Secondary | ICD-10-CM | POA: Diagnosis not present

## 2022-12-30 DIAGNOSIS — E785 Hyperlipidemia, unspecified: Secondary | ICD-10-CM | POA: Diagnosis not present

## 2022-12-30 DIAGNOSIS — E1122 Type 2 diabetes mellitus with diabetic chronic kidney disease: Secondary | ICD-10-CM | POA: Diagnosis not present

## 2022-12-30 DIAGNOSIS — N1831 Chronic kidney disease, stage 3a: Secondary | ICD-10-CM | POA: Diagnosis not present

## 2023-01-08 DIAGNOSIS — N3090 Cystitis, unspecified without hematuria: Secondary | ICD-10-CM | POA: Diagnosis not present

## 2023-01-08 DIAGNOSIS — A419 Sepsis, unspecified organism: Secondary | ICD-10-CM | POA: Diagnosis not present

## 2023-01-08 DIAGNOSIS — I480 Paroxysmal atrial fibrillation: Secondary | ICD-10-CM | POA: Diagnosis not present

## 2023-01-08 DIAGNOSIS — I130 Hypertensive heart and chronic kidney disease with heart failure and stage 1 through stage 4 chronic kidney disease, or unspecified chronic kidney disease: Secondary | ICD-10-CM | POA: Diagnosis not present

## 2023-01-08 DIAGNOSIS — N136 Pyonephrosis: Secondary | ICD-10-CM | POA: Diagnosis not present

## 2023-01-08 DIAGNOSIS — N16 Renal tubulo-interstitial disorders in diseases classified elsewhere: Secondary | ICD-10-CM | POA: Diagnosis not present

## 2023-01-17 DIAGNOSIS — E1165 Type 2 diabetes mellitus with hyperglycemia: Secondary | ICD-10-CM | POA: Diagnosis not present

## 2023-01-17 DIAGNOSIS — E119 Type 2 diabetes mellitus without complications: Secondary | ICD-10-CM | POA: Diagnosis not present

## 2023-01-17 DIAGNOSIS — I4891 Unspecified atrial fibrillation: Secondary | ICD-10-CM | POA: Diagnosis not present

## 2023-01-17 DIAGNOSIS — M1991 Primary osteoarthritis, unspecified site: Secondary | ICD-10-CM | POA: Diagnosis not present

## 2023-01-23 DIAGNOSIS — N3090 Cystitis, unspecified without hematuria: Secondary | ICD-10-CM | POA: Diagnosis not present

## 2023-01-23 DIAGNOSIS — N16 Renal tubulo-interstitial disorders in diseases classified elsewhere: Secondary | ICD-10-CM | POA: Diagnosis not present

## 2023-01-23 DIAGNOSIS — A419 Sepsis, unspecified organism: Secondary | ICD-10-CM | POA: Diagnosis not present

## 2023-01-23 DIAGNOSIS — N136 Pyonephrosis: Secondary | ICD-10-CM | POA: Diagnosis not present

## 2023-01-23 DIAGNOSIS — I130 Hypertensive heart and chronic kidney disease with heart failure and stage 1 through stage 4 chronic kidney disease, or unspecified chronic kidney disease: Secondary | ICD-10-CM | POA: Diagnosis not present

## 2023-01-23 DIAGNOSIS — I480 Paroxysmal atrial fibrillation: Secondary | ICD-10-CM | POA: Diagnosis not present

## 2023-02-17 DIAGNOSIS — E119 Type 2 diabetes mellitus without complications: Secondary | ICD-10-CM | POA: Diagnosis not present

## 2023-03-19 DIAGNOSIS — E119 Type 2 diabetes mellitus without complications: Secondary | ICD-10-CM | POA: Diagnosis not present

## 2023-03-26 DIAGNOSIS — I10 Essential (primary) hypertension: Secondary | ICD-10-CM | POA: Diagnosis not present

## 2023-03-26 DIAGNOSIS — E7801 Familial hypercholesterolemia: Secondary | ICD-10-CM | POA: Diagnosis not present

## 2023-03-26 DIAGNOSIS — E7849 Other hyperlipidemia: Secondary | ICD-10-CM | POA: Diagnosis not present

## 2023-03-26 DIAGNOSIS — E1165 Type 2 diabetes mellitus with hyperglycemia: Secondary | ICD-10-CM | POA: Diagnosis not present

## 2023-03-26 DIAGNOSIS — E876 Hypokalemia: Secondary | ICD-10-CM | POA: Diagnosis not present

## 2023-03-26 DIAGNOSIS — E871 Hypo-osmolality and hyponatremia: Secondary | ICD-10-CM | POA: Diagnosis not present

## 2023-03-26 DIAGNOSIS — N183 Chronic kidney disease, stage 3 unspecified: Secondary | ICD-10-CM | POA: Diagnosis not present

## 2023-04-01 DIAGNOSIS — Z23 Encounter for immunization: Secondary | ICD-10-CM | POA: Diagnosis not present

## 2023-04-01 DIAGNOSIS — R809 Proteinuria, unspecified: Secondary | ICD-10-CM | POA: Diagnosis not present

## 2023-04-01 DIAGNOSIS — E1140 Type 2 diabetes mellitus with diabetic neuropathy, unspecified: Secondary | ICD-10-CM | POA: Diagnosis not present

## 2023-04-01 DIAGNOSIS — E1121 Type 2 diabetes mellitus with diabetic nephropathy: Secondary | ICD-10-CM | POA: Diagnosis not present

## 2023-04-01 DIAGNOSIS — E7800 Pure hypercholesterolemia, unspecified: Secondary | ICD-10-CM | POA: Diagnosis not present

## 2023-04-01 DIAGNOSIS — E7801 Familial hypercholesterolemia: Secondary | ICD-10-CM | POA: Diagnosis not present

## 2023-04-01 DIAGNOSIS — E1165 Type 2 diabetes mellitus with hyperglycemia: Secondary | ICD-10-CM | POA: Diagnosis not present

## 2023-04-01 DIAGNOSIS — M545 Low back pain, unspecified: Secondary | ICD-10-CM | POA: Diagnosis not present

## 2023-04-01 DIAGNOSIS — N183 Chronic kidney disease, stage 3 unspecified: Secondary | ICD-10-CM | POA: Diagnosis not present

## 2023-04-01 DIAGNOSIS — R634 Abnormal weight loss: Secondary | ICD-10-CM | POA: Diagnosis not present

## 2023-04-01 DIAGNOSIS — R0989 Other specified symptoms and signs involving the circulatory and respiratory systems: Secondary | ICD-10-CM | POA: Diagnosis not present

## 2023-04-01 DIAGNOSIS — I482 Chronic atrial fibrillation, unspecified: Secondary | ICD-10-CM | POA: Diagnosis not present

## 2023-04-01 DIAGNOSIS — I10 Essential (primary) hypertension: Secondary | ICD-10-CM | POA: Diagnosis not present

## 2023-04-19 DIAGNOSIS — E119 Type 2 diabetes mellitus without complications: Secondary | ICD-10-CM | POA: Diagnosis not present

## 2023-05-19 DIAGNOSIS — E119 Type 2 diabetes mellitus without complications: Secondary | ICD-10-CM | POA: Diagnosis not present

## 2023-05-19 DIAGNOSIS — E1122 Type 2 diabetes mellitus with diabetic chronic kidney disease: Secondary | ICD-10-CM | POA: Diagnosis not present

## 2023-05-19 DIAGNOSIS — I10 Essential (primary) hypertension: Secondary | ICD-10-CM | POA: Diagnosis not present

## 2023-06-04 ENCOUNTER — Ambulatory Visit: Payer: Medicare Other | Admitting: Cardiology

## 2023-06-07 ENCOUNTER — Ambulatory Visit: Payer: Medicare Other | Attending: Cardiology | Admitting: Cardiology

## 2023-06-07 ENCOUNTER — Encounter: Payer: Self-pay | Admitting: Cardiology

## 2023-06-07 VITALS — BP 138/84 | HR 83 | Ht 60.0 in | Wt 133.4 lb

## 2023-06-07 DIAGNOSIS — E782 Mixed hyperlipidemia: Secondary | ICD-10-CM | POA: Insufficient documentation

## 2023-06-07 DIAGNOSIS — I4821 Permanent atrial fibrillation: Secondary | ICD-10-CM | POA: Insufficient documentation

## 2023-06-07 NOTE — Progress Notes (Signed)
    Cardiology Office Note  Date: 06/07/2023   ID: REED EIFERT, DOB 01-Aug-1927, MRN [id number]  History of Present Illness: [name] is a 89 y.o. female last seen in December 2023.  She is here with her daughter for follow up visit.  She does not report any sense of palpitations, no worsening shortness of breath or exertional chest pain.  Denies any falls.  I reviewed her medications.  She remains on Eliquis for stroke prophylaxis and combination of atenolol and Cardizem CD for heart rate control.  I went over her lab work from earlier in the year.  She does not report any spontaneous bleeding problems.  Physical Exam: VS:  BP 138/84 (BP Location: Left Arm, Patient Position: Sitting, Cuff Size: Normal)   Pulse 83   Ht 5' (1.524 m)   Wt 133 lb 6.4 oz (60.5 kg)   SpO2 100%   BMI 26.05 kg/m , BMI Body mass index is 26.05 kg/m.  Wt Readings from Last 3 Encounters:  06/07/23 133 lb 6.4 oz (60.5 kg)  12/05/22 149 lb 11.1 oz (67.9 kg)  11/26/22 149 lb 11.1 oz (67.9 kg)    General: Patient appears comfortable at rest. HEENT: Conjunctiva and lids normal. Neck: Supple, no elevated JVP or carotid bruits. Lungs: Clear to auscultation, nonlabored breathing at rest. Cardiac: Irregularly irregular, no S3, soft systolic murmur. Extremities: No pitting edema.  ECG:  An ECG dated 12/05/2022 was personally reviewed today and demonstrated:  Atrial fibrillation with RVR and PVCs versus aberrantly conducted complexes.  Labwork: May 2023: cholesterol 155, triglycerides 98, HDL 67, LDL 70 12/06/2022: TSH 0.966 12/07/2022: ALT 68; AST 75; BUN 41; Creatinine, Ser 0.98; Hemoglobin 9.2; Magnesium 2.3; Platelets 382; Potassium 4.2; Sodium 134   Other Studies Reviewed Today:  No interval cardiac testing for review today.  Assessment and Plan:  1.  Permanent atrial fibrillation with CHA2DS2-VASc score of 7.  She is symptomatically stable without palpitations on medical therapy including atenolol and  Cardizem CD.  Continue Eliquis for stroke prophylaxis.  She does not report any spontaneous bleeding problems.  2.  History of mixed hyperlipidemia, previously on Zocor however discontinued by PCP.  Her last LDL was 70.  Disposition:  Follow up  6 months.  Signed, Jonelle Sidle, M.D., F.A.C.C. [facility] at [facility]

## 2023-06-07 NOTE — Patient Instructions (Signed)
Medication Instructions:  Your physician recommends that you continue on your current medications as directed. Please refer to the Current Medication list given to you today.   Labwork: None today  Testing/Procedures: None today  Follow-Up: 6 months  Any Other Special Instructions Will Be Listed Below (If Applicable).  If you need a refill on your cardiac medications before your next appointment, please call your pharmacy.  

## 2023-06-19 DIAGNOSIS — E119 Type 2 diabetes mellitus without complications: Secondary | ICD-10-CM | POA: Diagnosis not present

## 2023-06-26 ENCOUNTER — Other Ambulatory Visit: Payer: Self-pay

## 2023-06-26 ENCOUNTER — Emergency Department (HOSPITAL_COMMUNITY): Payer: Medicare Other

## 2023-06-26 ENCOUNTER — Emergency Department (HOSPITAL_COMMUNITY)
Admission: EM | Admit: 2023-06-26 | Discharge: 2023-06-26 | Disposition: A | Discharge status: Home / Self Care | Payer: Medicare Other | Attending: Student | Admitting: Student

## 2023-06-26 ENCOUNTER — Encounter (HOSPITAL_COMMUNITY): Payer: Self-pay | Admitting: *Deleted

## 2023-06-26 DIAGNOSIS — Z7984 Long term (current) use of oral hypoglycemic drugs: Secondary | ICD-10-CM | POA: Insufficient documentation

## 2023-06-26 DIAGNOSIS — E119 Type 2 diabetes mellitus without complications: Secondary | ICD-10-CM | POA: Diagnosis not present

## 2023-06-26 DIAGNOSIS — R519 Headache, unspecified: Secondary | ICD-10-CM | POA: Diagnosis not present

## 2023-06-26 DIAGNOSIS — Z7901 Long term (current) use of anticoagulants: Secondary | ICD-10-CM | POA: Insufficient documentation

## 2023-06-26 DIAGNOSIS — N390 Urinary tract infection, site not specified: Secondary | ICD-10-CM | POA: Diagnosis not present

## 2023-06-26 DIAGNOSIS — I10 Essential (primary) hypertension: Secondary | ICD-10-CM | POA: Diagnosis not present

## 2023-06-26 DIAGNOSIS — R7989 Other specified abnormal findings of blood chemistry: Secondary | ICD-10-CM

## 2023-06-26 DIAGNOSIS — G4489 Other headache syndrome: Secondary | ICD-10-CM | POA: Diagnosis not present

## 2023-06-26 DIAGNOSIS — R4182 Altered mental status, unspecified: Secondary | ICD-10-CM | POA: Diagnosis not present

## 2023-06-26 LAB — CBC WITH DIFFERENTIAL/PLATELET
Abs Immature Granulocytes: 0.02 10*3/uL (ref 0.00–0.07)
Basophils Absolute: 0 10*3/uL (ref 0.0–0.1)
Basophils Relative: 0 %
Eosinophils Absolute: 0.1 10*3/uL (ref 0.0–0.5)
Eosinophils Relative: 1 %
HCT: 35.6 % — ABNORMAL LOW (ref 36.0–46.0)
Hemoglobin: 11.9 g/dL — ABNORMAL LOW (ref 12.0–15.0)
Immature Granulocytes: 0 %
Lymphocytes Relative: 16 %
Lymphs Abs: 1.1 10*3/uL (ref 0.7–4.0)
MCH: 32.2 pg (ref 26.0–34.0)
MCHC: 33.4 g/dL (ref 30.0–36.0)
MCV: 96.5 fL (ref 80.0–100.0)
Monocytes Absolute: 0.5 10*3/uL (ref 0.1–1.0)
Monocytes Relative: 8 %
Neutro Abs: 5.2 10*3/uL (ref 1.7–7.7)
Neutrophils Relative %: 75 %
Platelets: 380 10*3/uL (ref 150–400)
RBC: 3.69 MIL/uL — ABNORMAL LOW (ref 3.87–5.11)
RDW: 12.8 % (ref 11.5–15.5)
WBC: 7 10*3/uL (ref 4.0–10.5)
nRBC: 0 % (ref 0.0–0.2)

## 2023-06-26 LAB — COMPREHENSIVE METABOLIC PANEL
ALT: 14 U/L (ref 0–44)
AST: 16 U/L (ref 15–41)
Albumin: 3.9 g/dL (ref 3.5–5.0)
Alkaline Phosphatase: 45 U/L (ref 38–126)
Anion gap: 12 (ref 5–15)
BUN: 19 mg/dL (ref 8–23)
CO2: 21 mmol/L — ABNORMAL LOW (ref 22–32)
Calcium: 9.4 mg/dL (ref 8.9–10.3)
Chloride: 97 mmol/L — ABNORMAL LOW (ref 98–111)
Creatinine, Ser: 0.72 mg/dL (ref 0.44–1.00)
GFR, Estimated: >60 mL/min (ref >60)
Glucose, Bld: 128 mg/dL — ABNORMAL HIGH (ref 70–99)
Potassium: 4.5 mmol/L (ref 3.5–5.1)
Sodium: 130 mmol/L — ABNORMAL LOW (ref 135–145)
Total Bilirubin: 0.9 mg/dL (ref 0.3–1.2)
Total Protein: 7.3 g/dL (ref 6.5–8.1)

## 2023-06-26 LAB — URINALYSIS, ROUTINE W REFLEX MICROSCOPIC
Bilirubin Urine: NEGATIVE
Glucose, UA: NEGATIVE mg/dL
Hgb urine dipstick: NEGATIVE
Ketones, ur: NEGATIVE mg/dL
Nitrite: NEGATIVE
Protein, ur: 100 mg/dL — AB
Specific Gravity, Urine: 1.012 (ref 1.005–1.030)
WBC, UA: >50 WBC/hpf (ref 0–5)
pH: 5 (ref 5.0–8.0)

## 2023-06-26 MED ORDER — CEPHALEXIN 500 MG PO CAPS: 500.0000 mg | ORAL_CAPSULE | Freq: Three times a day (TID) | ORAL | 0 refills | Status: DC

## 2023-06-26 MED ORDER — CIPROFLOXACIN HCL 500 MG PO TABS: 500.0000 mg | ORAL_TABLET | Freq: Two times a day (BID) | ORAL | 0 refills | Status: DC

## 2023-06-26 NOTE — ED Provider Notes (Signed)
[place] EMERGENCY DEPARTMENT AT [facility] Provider Note   CSN: [id number] Arrival date & time: 06/26/23  1236     History  Chief Complaint  Patient presents with   Altered Mental Status    [name] is a 89 y.o. female presenting for new onset AMS that started yesterday morning, as well as HA, pt has pmhx of afib on a blood thinner, dm2, chronic uti on prophylactic abx, and arthritis. Pts daughter lives with pt and present in room reporting this pt has been slightly confusion, has difficulty forming words, and difficulty with ADL. Pts daughter reports HA is typical for pt, no worse than normal, started gradually this morning, pt reported it was severe, HA was relieved w/ tylenol and heat. At the time of presentation HA was resolved. Pt is reporting she is not having pain. Denies LOC, recent fall, dizziness. Reports taking medications as prescribed and no recent changes to medications.   The history is provided by the patient.  Altered Mental Status      Home Medications Prior to Admission medications   Medication Sig Start Date End Date Taking? Authorizing Provider  acetaminophen (TYLENOL) 325 MG tablet Take 2 tablets (650 mg total) by mouth every 6 (six) hours as needed for mild pain, fever or headache (or Fever >/= 101). 10/08/22   Shon Hale, MD  acidophilus (RISAQUAD) CAPS capsule Take 1 capsule by mouth daily.    [provider]  apixaban (ELIQUIS) 2.5 MG TABS tablet Take 1 tablet (2.5 mg total) by mouth 2 (two) times daily. 11/26/22 06/07/23  Shahmehdi, Gemma Payor, MD  atenolol (TENORMIN) 50 MG tablet Take 1 tablet (50 mg total) by mouth 2 (two) times daily. 12/03/22 11/28/23  Jonelle Sidle, MD  D-MANNOSE PO Take 1 capsule by mouth with breakfast, with lunch, and with evening meal.    [provider]  diltiazem (CARDIZEM CD) 120 MG 24 hr capsule Take 1 capsule (120 mg total) by mouth daily. 12/03/22 11/28/23  Jonelle Sidle, MD  metFORMIN  (GLUCOPHAGE) 500 MG tablet Take 500-1,000 mg by mouth See admin instructions. 1000 mg in the morning, 500 mg at bedtime.    [provider]  Multiple Vitamin (MULTIVITAMIN) LIQD Take 15 mLs by mouth daily. 11/27/22   Kendell Bane, MD      Allergies    Macrobid [nitrofurantoin] and Toprol xl [metoprolol succinate]    Review of Systems   Review of Systems  Constitutional: Negative.   Respiratory: Negative.    Cardiovascular: Negative.   Gastrointestinal: Negative.   Neurological: Negative.     Physical Exam Updated Vital Signs BP 137/72 (BP Location: Left Arm)   Pulse 90   Temp 97.8 F (36.6 C) (Oral)   Resp 16   Ht 5' (1.524 m)   Wt 60.5 kg   SpO2 97%   BMI 26.05 kg/m  Physical Exam Cardiovascular:     Rate and Rhythm: Normal rate. Rhythm irregular.     Heart sounds: No murmur heard. Pulmonary:     Effort: Pulmonary effort is normal. No respiratory distress.     Breath sounds: Normal breath sounds. No wheezing or rales.  Abdominal:     General: Bowel sounds are normal. There is no distension.     Palpations: Abdomen is soft.     Tenderness: There is abdominal tenderness. There is no guarding.  Neurological:     General: No focal deficit present.     Mental Status: She is  oriented to person, place, and time.     Sensory: No sensory deficit.     Motor: No weakness.     Gait: Gait normal.     ED Results / Procedures / Treatments   Labs (all labs ordered are listed, but only abnormal results are displayed) Labs Reviewed  COMPREHENSIVE METABOLIC PANEL  CBC WITH DIFFERENTIAL/PLATELET  URINALYSIS, ROUTINE W REFLEX MICROSCOPIC  CBG MONITORING, ED    EKG None  Radiology No results found.  Procedures Procedures    Medications Ordered in ED Medications - No data to display  ED Course/ Medical Decision Making/ A&P Clinical Course as of 06/26/23 1653  Wed Jun 26, 2023  1645 EKG 12-Lead [JB]  1652 Squamous Epithelial / HPF: 6-10 [JB]     Clinical Course User Index [JB] [name], Horald Chestnut, PA-C                                 Medical Decision Making Amount and/or Complexity of Data Reviewed Labs: ordered. Decision-making details documented in ED Course. Radiology: ordered. ECG/medicine tests:  Decision-making details documented in ED Course.   This patient presents to the ED for concern of AMS and HA, this involves an extensive number of treatment options, and is a complaint that carries with it a high risk of complications and morbidity.  The differential diagnosis includes UTI, intracranial bleed, dehydration, electrolyte abnormality.    Co morbidities that complicate the patient evaluation  Pmhx of afib, dm2, chronic UTI.    Additional history obtained:  Additional history obtained from patient, daughter at bedside.  Reviewed prior urine culture and sensitivity report from 10/03/22    Lab Tests:  I Ordered, and personally interpreted labs.  The pertinent results include:   Cbc wbc-7, Hgb-11.9 Cmp Na 130, k 4.5, glucose 128 Mg Pending  Urinalysis - cloudy, large leukocyte, high PRO, >50 WBC, (-) nitrites    Imaging Studies ordered:  I ordered imaging studies including Head CT w/o contrast   I independently visualized and interpreted imaging which showed  1. No acute intracranial abnormality. 2. Unchanged mild chronic small-vessel disease. I agree with the radiologist interpretation   Cardiac Monitoring: / EKG:  The patient was maintained on a cardiac monitor.  I personally viewed and interpreted the cardiac monitored which showed an underlying rhythm of: irregularly irregular, afib. Rate 81 bpm.    Consultations Obtained:  None    Problem List / ED Course / Critical interventions / Medication management  [name] is a 89 y.o. female presenting for AMS that started yesterday morning. She reports she also had a HA this morning that resolved w/ tylenol and heat.  At the time of presentation  pt is reporting she is not having pain. Denies LOC, recent fall, dizziness. Reports taking medications as prescribed, including blood thinner and no recent changes to medications and denies recent fall.   I ordered medication including Cipro for Positive urinalysis   Reevaluation of the patient after these medicines showed that the patient: similar  I have reviewed the patients home medicines and have made adjustments as needed   Social Determinants of Health:  Pt reports regular f/u w/ pcp and cardiologist  Pt lives at home w/ daughter    Test / Admission - Considered:     Patient has positive urinalysis today. Urine culture ordered. Discussed with daughter and patient, treatment plan. Will add Cipro to currently daily ABX. They  agree to plan.  Will return with Ed with new or worsening symptoms. F/u w/ PCP in 2-3 days.           Final Clinical Impression(s) / ED Diagnoses Final diagnoses:  None    Rx / DC Orders ED Discharge Orders     None         Smitty Knudsen, PA-C 06/26/23 2250    Glendora Score, MD 06/27/23 [phone]

## 2023-06-26 NOTE — Discharge Instructions (Addendum)
You have a urine culture pending that should result in the next 72 hours. We have prescribed you the antibiotic Ciprofloxacin to pick up. Continue taking the daily Bactrim, as well.  We will call you with urine culture results if it changes your current treatment plan, if you do not receive a call continue taking the Ciprofloxacin until the prescription is complete.   Return to ED if the symptoms continue or if they worsen. Follow up with PCP Dr within 2-3 days.

## 2023-06-26 NOTE — ED Triage Notes (Signed)
Pt BIB [facility] for c/o having more AMS than normal and family reports pt was c/o headache this am with unable to form words  Pt is alert to person and place  Pt denies any pain at this time  Family states pt is on a low dose of antibiotics daily to prevent UTIs

## 2023-06-29 ENCOUNTER — Telehealth (HOSPITAL_BASED_OUTPATIENT_CLINIC_OR_DEPARTMENT_OTHER): Payer: Self-pay | Admitting: *Deleted

## 2023-06-29 NOTE — Progress Notes (Signed)
ED Antimicrobial Stewardship Positive Culture Follow Up   [name] is an 89 y.o. female who presented to [facility] on 06/26/2023 with a chief complaint of  Chief Complaint  Patient presents with   Altered Mental Status    Recent Results (from the past 720 hour(s))  Urine Culture     Status: Abnormal   Collection Time: 06/26/23  2:54 PM   Specimen: Urine, Clean Catch  Result Value Ref Range Status   Specimen Description   Final    URINE, CLEAN CATCH Performed at [facility], [address] [place], [place] [address]    Special Requests   Final    NONE Performed at [facility], [address] [place], [place] [address]    Culture (A)  Final    40,000 COLONIES/mL ENTEROCOCCUS FAECALIS VANCOMYCIN RESISTANT ENTEROCOCCUS ISOLATED    Report Status 06/28/2023 FINAL  Final   Organism ID, Bacteria ENTEROCOCCUS FAECALIS (A)  Final      Susceptibility   Enterococcus faecalis - MIC*    AMPICILLIN <=2 SENSITIVE Sensitive     NITROFURANTOIN <=16 SENSITIVE Sensitive     VANCOMYCIN >=32 RESISTANT Resistant     LINEZOLID 2 SENSITIVE Sensitive     * 40,000 COLONIES/mL ENTEROCOCCUS FAECALIS    [x]  Treated with Cipro, organism resistant to prescribed antimicrobial []  Patient discharged originally without antimicrobial agent and treatment is now indicated  New antibiotic prescription: Amoxil  ED Provider: Arabella Merles, PA-C   [name] 06/29/2023, 10:34 AM Clinical Pharmacist Monday - Friday phone -  [phone] Saturday - Sunday phone - [phone]

## 2023-06-29 NOTE — Telephone Encounter (Signed)
Post ED Visit - Positive Culture Follow-up: Successful Patient Follow-Up  Culture assessed and recommendations reviewed by:  [x]  [name], Pharm.D. []  Celedonio Miyamoto, Pharm.D., BCPS AQ-ID []  Garvin Fila, Pharm.D., BCPS []  Georgina Pillion, Pharm.D., BCPS []  [place], [address].D., BCPS, AAHIVP []  Estella Husk, Pharm.D., BCPS, AAHIVP []  Lysle Pearl, PharmD, BCPS []  Phillips Climes, PharmD, BCPS []  Agapito Games, PharmD, BCPS   Positive urine culture  []  Patient discharged without antimicrobial prescription and treatment is now indicated [x]  Organism is resistant to prescribed ED discharge antimicrobial []  Patient with positive blood cultures  Changes discussed with ED provider: Arabella Merles, PA-C New antibiotic prescription Amoxil 500mg  q8 hrs x 5 days Stop Cipro/Keflex  Called to [facility] [place], [place]  Contacted patient, date 06/29/23, time 1105   Patsey Berthold 06/29/2023, 11:06 AM

## 2023-07-23 DIAGNOSIS — N183 Chronic kidney disease, stage 3 unspecified: Secondary | ICD-10-CM | POA: Diagnosis not present

## 2023-07-23 DIAGNOSIS — E876 Hypokalemia: Secondary | ICD-10-CM | POA: Diagnosis not present

## 2023-07-23 DIAGNOSIS — E7801 Familial hypercholesterolemia: Secondary | ICD-10-CM | POA: Diagnosis not present

## 2023-07-23 DIAGNOSIS — E1122 Type 2 diabetes mellitus with diabetic chronic kidney disease: Secondary | ICD-10-CM | POA: Diagnosis not present

## 2023-07-23 DIAGNOSIS — E7849 Other hyperlipidemia: Secondary | ICD-10-CM | POA: Diagnosis not present

## 2023-07-29 DIAGNOSIS — E7800 Pure hypercholesterolemia, unspecified: Secondary | ICD-10-CM | POA: Diagnosis not present

## 2023-07-29 DIAGNOSIS — E1140 Type 2 diabetes mellitus with diabetic neuropathy, unspecified: Secondary | ICD-10-CM | POA: Diagnosis not present

## 2023-07-29 DIAGNOSIS — R0989 Other specified symptoms and signs involving the circulatory and respiratory systems: Secondary | ICD-10-CM | POA: Diagnosis not present

## 2023-07-29 DIAGNOSIS — R809 Proteinuria, unspecified: Secondary | ICD-10-CM | POA: Diagnosis not present

## 2023-07-29 DIAGNOSIS — E1121 Type 2 diabetes mellitus with diabetic nephropathy: Secondary | ICD-10-CM | POA: Diagnosis not present

## 2023-07-29 DIAGNOSIS — E1165 Type 2 diabetes mellitus with hyperglycemia: Secondary | ICD-10-CM | POA: Diagnosis not present

## 2023-07-29 DIAGNOSIS — M545 Low back pain, unspecified: Secondary | ICD-10-CM | POA: Diagnosis not present

## 2023-07-29 DIAGNOSIS — I10 Essential (primary) hypertension: Secondary | ICD-10-CM | POA: Diagnosis not present

## 2023-07-29 DIAGNOSIS — R634 Abnormal weight loss: Secondary | ICD-10-CM | POA: Diagnosis not present

## 2023-07-29 DIAGNOSIS — I482 Chronic atrial fibrillation, unspecified: Secondary | ICD-10-CM | POA: Diagnosis not present

## 2023-08-12 ENCOUNTER — Other Ambulatory Visit: Payer: Self-pay | Admitting: Cardiology

## 2023-08-26 DIAGNOSIS — Z23 Encounter for immunization: Secondary | ICD-10-CM | POA: Diagnosis not present

## 2023-09-19 DIAGNOSIS — E1165 Type 2 diabetes mellitus with hyperglycemia: Secondary | ICD-10-CM | POA: Diagnosis not present

## 2023-09-19 DIAGNOSIS — I4891 Unspecified atrial fibrillation: Secondary | ICD-10-CM | POA: Diagnosis not present

## 2023-09-19 DIAGNOSIS — I10 Essential (primary) hypertension: Secondary | ICD-10-CM | POA: Diagnosis not present

## 2023-10-02 DIAGNOSIS — R300 Dysuria: Secondary | ICD-10-CM | POA: Diagnosis not present

## 2023-10-02 DIAGNOSIS — R030 Elevated blood-pressure reading, without diagnosis of hypertension: Secondary | ICD-10-CM | POA: Diagnosis not present

## 2023-10-02 DIAGNOSIS — R410 Disorientation, unspecified: Secondary | ICD-10-CM | POA: Diagnosis not present

## 2023-10-02 DIAGNOSIS — N3090 Cystitis, unspecified without hematuria: Secondary | ICD-10-CM | POA: Diagnosis not present

## 2023-10-02 DIAGNOSIS — Z6828 Body mass index (BMI) 28.0-28.9, adult: Secondary | ICD-10-CM | POA: Diagnosis not present

## 2023-10-21 DIAGNOSIS — R300 Dysuria: Secondary | ICD-10-CM | POA: Diagnosis not present

## 2023-11-24 IMAGING — CT CT HEAD W/O CM
3 of 4 series · 15 of 47 positions shown, 18 images · non-contrast
Comparison: 08/15/2021

CLINICAL DATA: Altered mental status



[Series 2: head w o · axial · 0.41mm/px · z∈[+98,+223]mm · 9 of 31 slices shown, 12 images]
[im 3/31  brain]
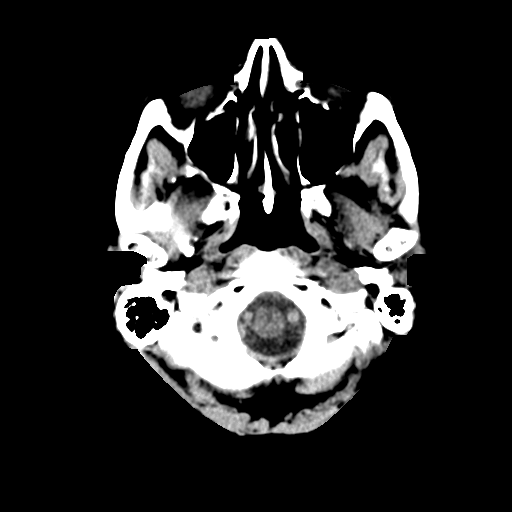
[im 3/31  bone]
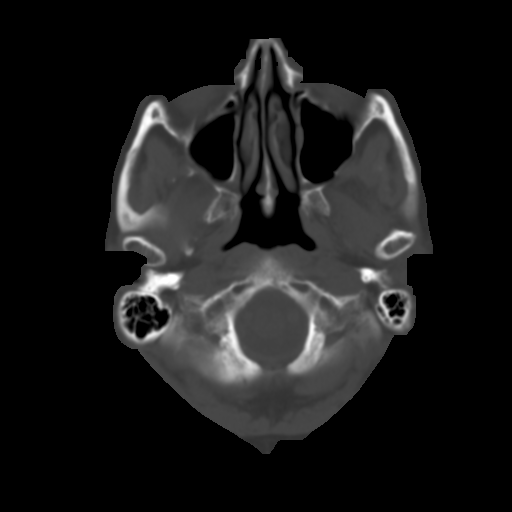
[im 7/31  brain]
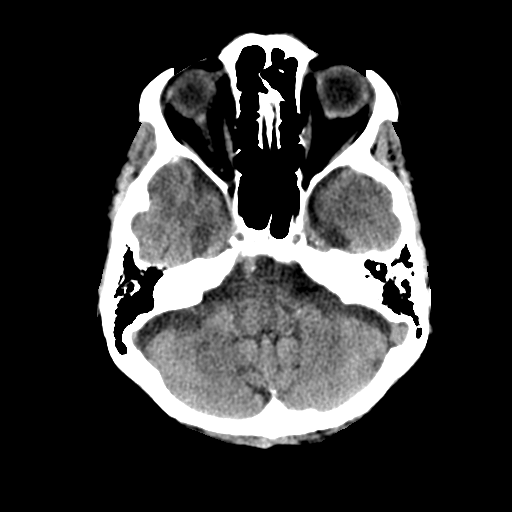
[im 9/31  brain]
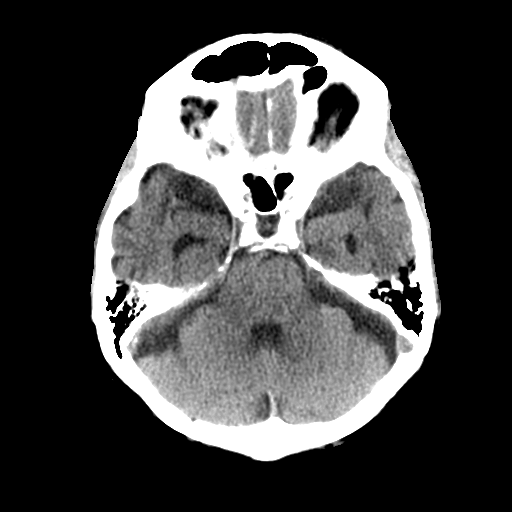
[im 13/31  brain]
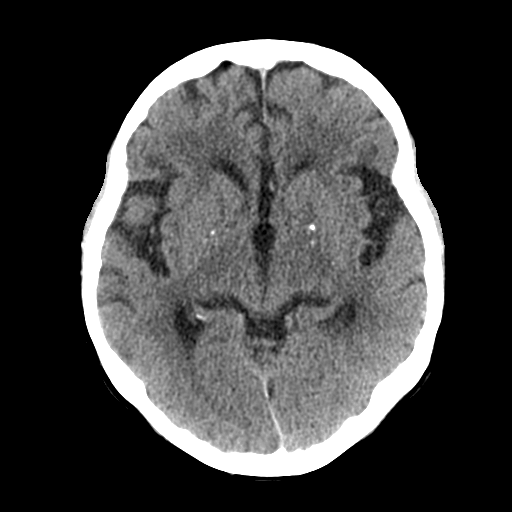
[im 16/31  brain]
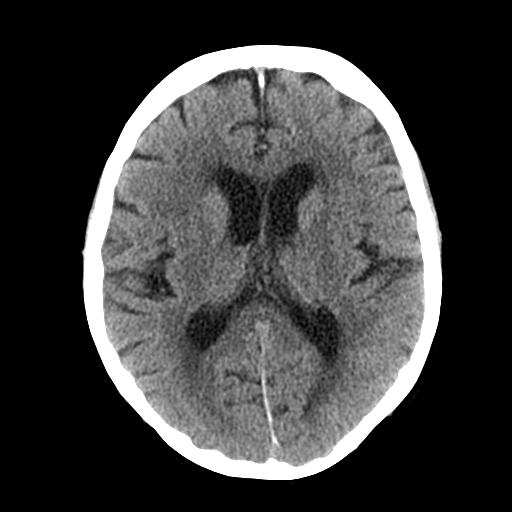
[im 16/31  bone]
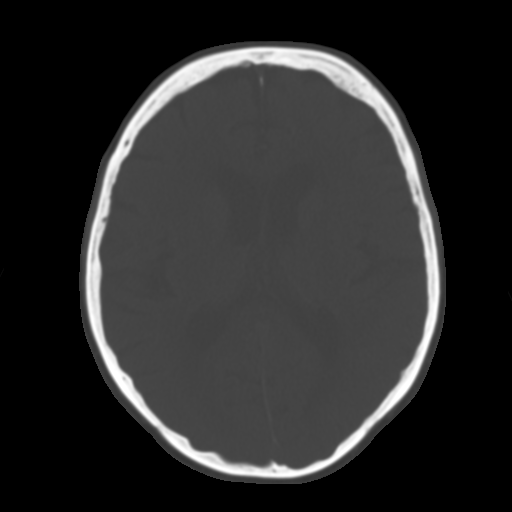
[im 18/31  brain]
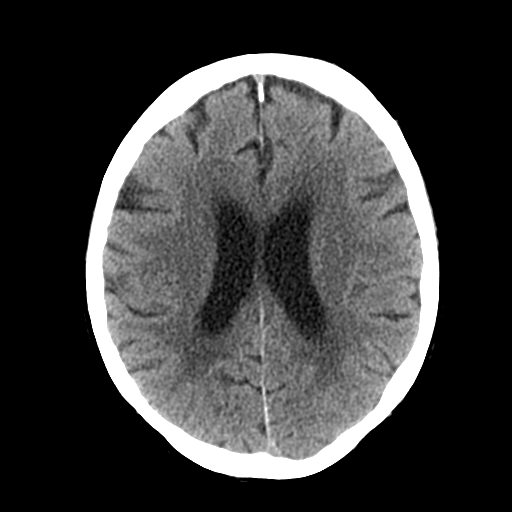
[im 22/31  brain]
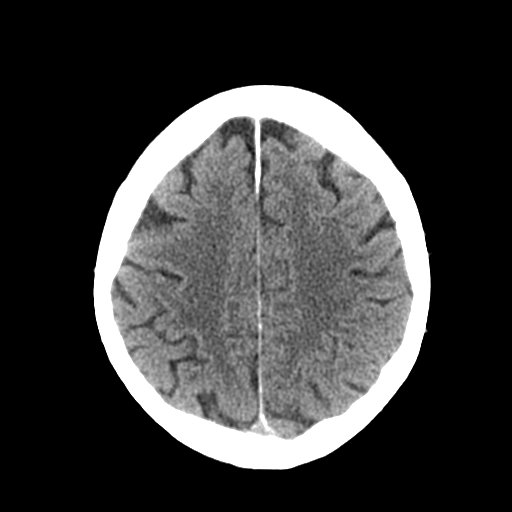
[im 24/31  brain]
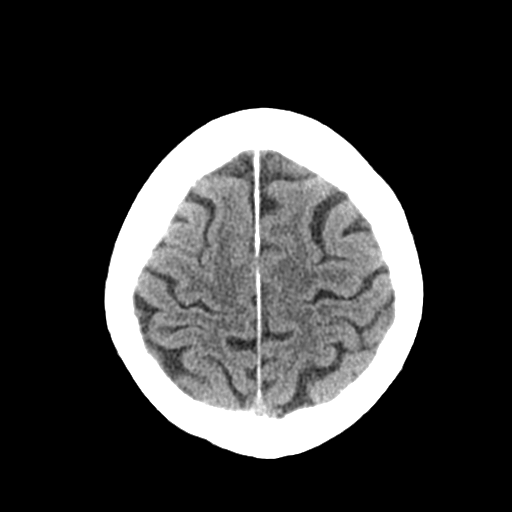
[im 28/31  brain]
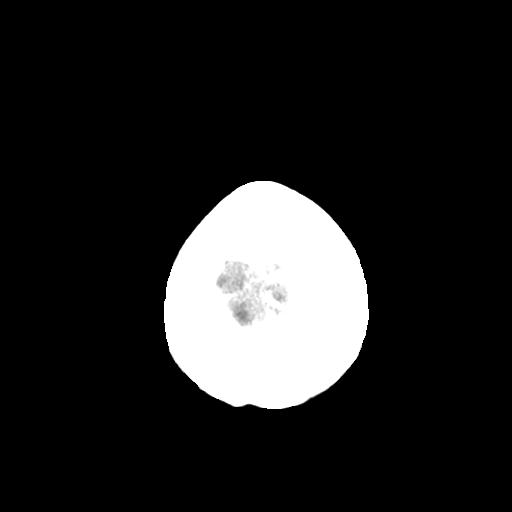
[im 28/31  bone]
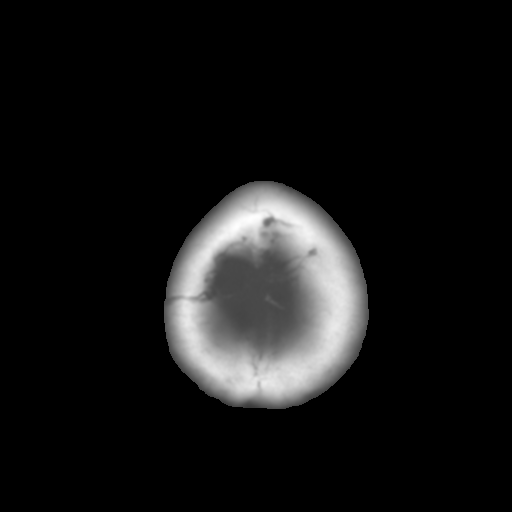

[Series 4: coronal soft · coronal · 0.34mm/px · 3 of 67 slices shown]
[im 23/67  brain]
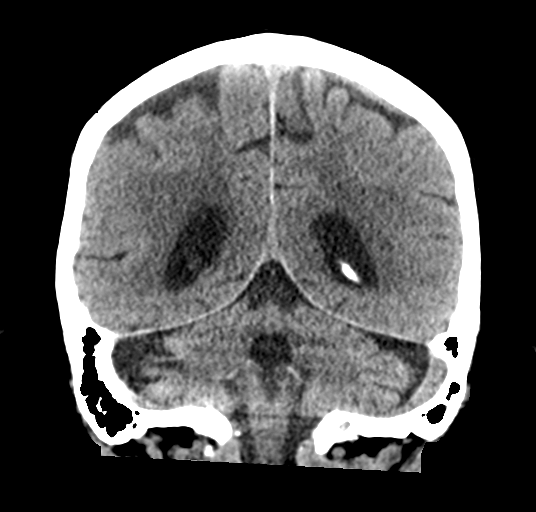
[im 30/67  brain]
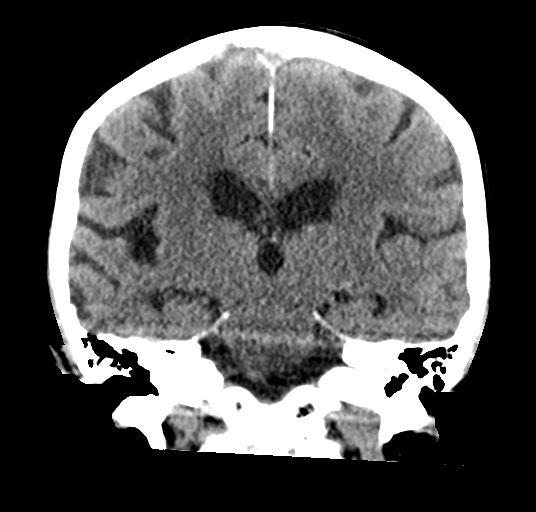
[im 37/67  brain]
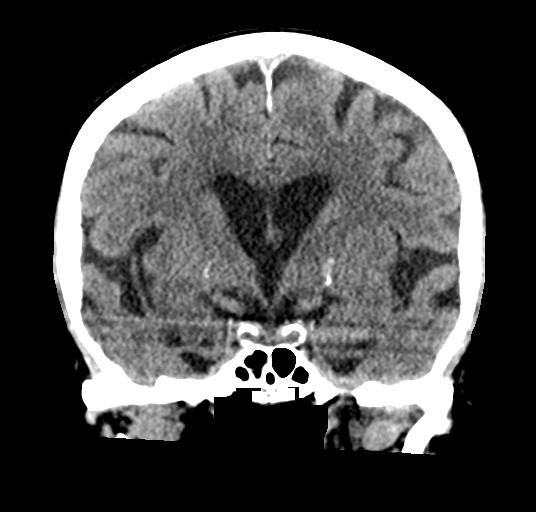

[Series 5: sagittal soft · sagittal · 0.33mm/px · 3 of 56 slices shown]
[im 19/56  brain]
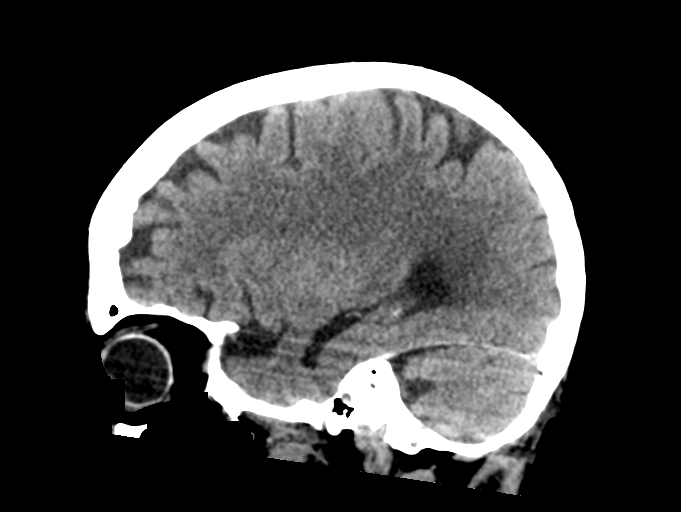
[im 28/56  brain]
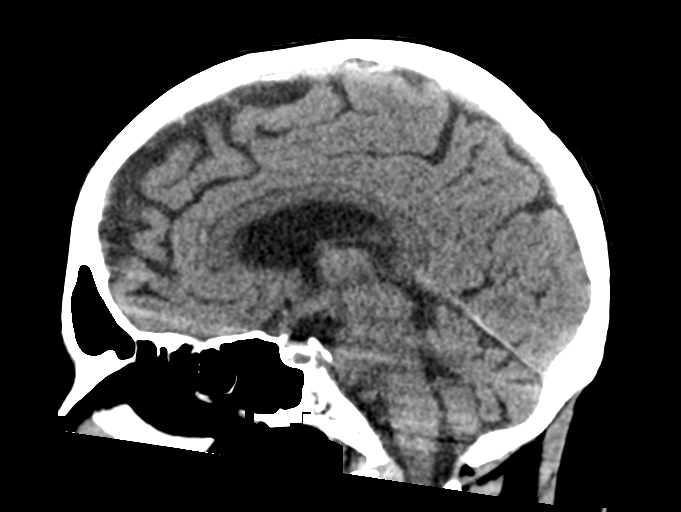
[im 37/56  brain]
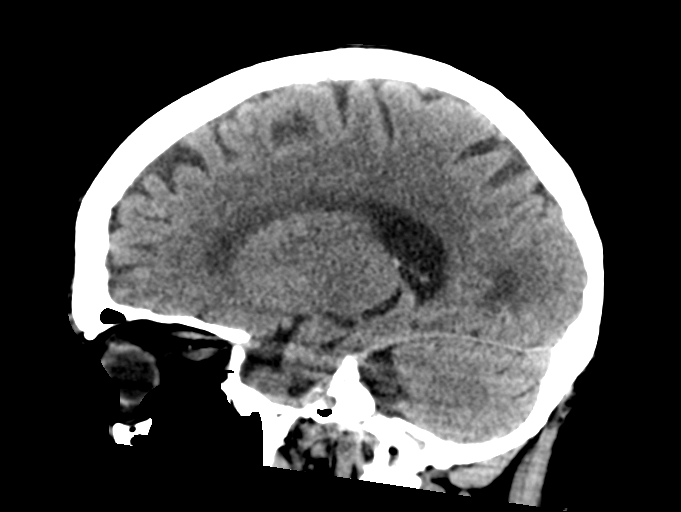

[15 of 47 positions shown; findings below may reference images not displayed]

FINDINGS: Brain: There is atrophy and chronic small vessel disease changes. No
acute intracranial abnormality. Specifically, no hemorrhage,
hydrocephalus, mass lesion, acute infarction, or significant
intracranial injury.

Vascular: No hyperdense vessel or unexpected calcification.

Skull: No acute calvarial abnormality.

Sinuses/Orbits: No acute finding

Other: None
IMPRESSION: Atrophy, chronic microvascular disease.

No acute intracranial abnormality.

## 2023-11-26 DIAGNOSIS — E1165 Type 2 diabetes mellitus with hyperglycemia: Secondary | ICD-10-CM | POA: Diagnosis not present

## 2023-11-26 DIAGNOSIS — N183 Chronic kidney disease, stage 3 unspecified: Secondary | ICD-10-CM | POA: Diagnosis not present

## 2023-11-26 DIAGNOSIS — E1121 Type 2 diabetes mellitus with diabetic nephropathy: Secondary | ICD-10-CM | POA: Diagnosis not present

## 2023-11-26 DIAGNOSIS — E7849 Other hyperlipidemia: Secondary | ICD-10-CM | POA: Diagnosis not present

## 2023-11-26 DIAGNOSIS — E1122 Type 2 diabetes mellitus with diabetic chronic kidney disease: Secondary | ICD-10-CM | POA: Diagnosis not present

## 2023-11-26 DIAGNOSIS — E875 Hyperkalemia: Secondary | ICD-10-CM | POA: Diagnosis not present

## 2023-12-09 ENCOUNTER — Other Ambulatory Visit: Payer: Self-pay | Admitting: Cardiology

## 2023-12-09 ENCOUNTER — Other Ambulatory Visit: Payer: Self-pay | Admitting: *Deleted

## 2023-12-09 DIAGNOSIS — I4891 Unspecified atrial fibrillation: Secondary | ICD-10-CM | POA: Diagnosis not present

## 2023-12-09 DIAGNOSIS — N1831 Chronic kidney disease, stage 3a: Secondary | ICD-10-CM | POA: Diagnosis not present

## 2023-12-09 DIAGNOSIS — Z9181 History of falling: Secondary | ICD-10-CM | POA: Diagnosis not present

## 2023-12-09 DIAGNOSIS — Z23 Encounter for immunization: Secondary | ICD-10-CM | POA: Diagnosis not present

## 2023-12-09 DIAGNOSIS — Z6828 Body mass index (BMI) 28.0-28.9, adult: Secondary | ICD-10-CM | POA: Diagnosis not present

## 2023-12-09 DIAGNOSIS — I10 Essential (primary) hypertension: Secondary | ICD-10-CM | POA: Diagnosis not present

## 2023-12-09 MED ORDER — APIXABAN 2.5 MG PO TABS: 2.5000 mg | ORAL_TABLET | Freq: Two times a day (BID) | ORAL | 5 refills | Status: DC

## 2023-12-09 NOTE — Telephone Encounter (Signed)
Prescription refill request for Eliquis received. Indication: AF Last office visit: 06/07/23  Ival Bible MD Scr: 0.72 on 06/26/23  Epic Age: 89 Weight: 60.5kg  Based on above findings Eliquis 2.5mg  twice daily is the appropriate dose.  Refill approved.

## 2023-12-11 ENCOUNTER — Other Ambulatory Visit: Payer: Self-pay | Admitting: Cardiology

## 2023-12-12 ENCOUNTER — Telehealth: Payer: Self-pay | Admitting: Cardiology

## 2023-12-12 NOTE — Telephone Encounter (Signed)
Pt c/o medication issue:  1. Name of Medication: apixaban (ELIQUIS) 2.5 MG TABS tablet   2. How are you currently taking this medication (dosage and times per day)?    3. Are you having a reaction (difficulty breathing--STAT)? no  4. What is your medication issue? Daughter calling to get the form grant for the medication. Please advise

## 2023-12-12 NOTE — Telephone Encounter (Signed)
Returned call to pt. No answer. Left msg to call back.  

## 2023-12-13 NOTE — Telephone Encounter (Signed)
Spoke with daughter. Will print and mail Eliquis patient assistance form to pt and daughter.

## 2023-12-13 NOTE — Telephone Encounter (Signed)
Patient is returning call. Transferred to [place], LPN.

## 2023-12-13 NOTE — Telephone Encounter (Signed)
Returned call to pt. No answer. Left msg to call back.

## 2023-12-18 ENCOUNTER — Other Ambulatory Visit: Payer: Self-pay

## 2023-12-18 ENCOUNTER — Encounter (HOSPITAL_COMMUNITY): Payer: Self-pay | Admitting: Emergency Medicine

## 2023-12-18 ENCOUNTER — Emergency Department (HOSPITAL_COMMUNITY)
Admission: EM | Admit: 2023-12-18 | Discharge: 2023-12-18 | Disposition: A | Discharge status: Home / Self Care | Payer: Medicare Other | Attending: Emergency Medicine | Admitting: Emergency Medicine

## 2023-12-18 DIAGNOSIS — I110 Hypertensive heart disease with heart failure: Secondary | ICD-10-CM | POA: Diagnosis not present

## 2023-12-18 DIAGNOSIS — R300 Dysuria: Secondary | ICD-10-CM | POA: Diagnosis not present

## 2023-12-18 DIAGNOSIS — R7989 Other specified abnormal findings of blood chemistry: Secondary | ICD-10-CM | POA: Insufficient documentation

## 2023-12-18 DIAGNOSIS — Z7901 Long term (current) use of anticoagulants: Secondary | ICD-10-CM | POA: Insufficient documentation

## 2023-12-18 DIAGNOSIS — D649 Anemia, unspecified: Secondary | ICD-10-CM | POA: Insufficient documentation

## 2023-12-18 DIAGNOSIS — R000 Tachycardia, unspecified: Secondary | ICD-10-CM | POA: Diagnosis not present

## 2023-12-18 DIAGNOSIS — N390 Urinary tract infection, site not specified: Secondary | ICD-10-CM | POA: Diagnosis not present

## 2023-12-18 DIAGNOSIS — E119 Type 2 diabetes mellitus without complications: Secondary | ICD-10-CM | POA: Insufficient documentation

## 2023-12-18 DIAGNOSIS — I5030 Unspecified diastolic (congestive) heart failure: Secondary | ICD-10-CM | POA: Diagnosis not present

## 2023-12-18 DIAGNOSIS — N3000 Acute cystitis without hematuria: Secondary | ICD-10-CM | POA: Insufficient documentation

## 2023-12-18 DIAGNOSIS — I2510 Atherosclerotic heart disease of native coronary artery without angina pectoris: Secondary | ICD-10-CM | POA: Insufficient documentation

## 2023-12-18 DIAGNOSIS — R509 Fever, unspecified: Secondary | ICD-10-CM | POA: Diagnosis not present

## 2023-12-18 LAB — URINALYSIS, ROUTINE W REFLEX MICROSCOPIC
Bacteria, UA: NONE SEEN
Bilirubin Urine: NEGATIVE
Glucose, UA: 50 mg/dL — AB
Ketones, ur: NEGATIVE mg/dL
Nitrite: NEGATIVE
Protein, ur: 100 mg/dL — AB
Specific Gravity, Urine: 1.011 (ref 1.005–1.030)
WBC, UA: >50 WBC/hpf (ref 0–5)
pH: 6 (ref 5.0–8.0)

## 2023-12-18 LAB — CBC WITH DIFFERENTIAL/PLATELET
Abs Immature Granulocytes: 0.03 10*3/uL (ref 0.00–0.07)
Basophils Absolute: 0 10*3/uL (ref 0.0–0.1)
Basophils Relative: 0 %
Eosinophils Absolute: 0.1 10*3/uL (ref 0.0–0.5)
Eosinophils Relative: 1 %
HCT: 33.5 % — ABNORMAL LOW (ref 36.0–46.0)
Hemoglobin: 10.9 g/dL — ABNORMAL LOW (ref 12.0–15.0)
Immature Granulocytes: 0 %
Lymphocytes Relative: 13 %
Lymphs Abs: 1.2 10*3/uL (ref 0.7–4.0)
MCH: 31.2 pg (ref 26.0–34.0)
MCHC: 32.5 g/dL (ref 30.0–36.0)
MCV: 96 fL (ref 80.0–100.0)
Monocytes Absolute: 0.8 10*3/uL (ref 0.1–1.0)
Monocytes Relative: 8 %
Neutro Abs: 7.6 10*3/uL (ref 1.7–7.7)
Neutrophils Relative %: 78 %
Platelets: 411 10*3/uL — ABNORMAL HIGH (ref 150–400)
RBC: 3.49 MIL/uL — ABNORMAL LOW (ref 3.87–5.11)
RDW: 12.7 % (ref 11.5–15.5)
WBC: 9.7 10*3/uL (ref 4.0–10.5)
nRBC: 0 % (ref 0.0–0.2)

## 2023-12-18 LAB — COMPREHENSIVE METABOLIC PANEL
ALT: 13 U/L (ref 0–44)
AST: 16 U/L (ref 15–41)
Albumin: 3.7 g/dL (ref 3.5–5.0)
Alkaline Phosphatase: 46 U/L (ref 38–126)
Anion gap: 13 (ref 5–15)
BUN: 24 mg/dL — ABNORMAL HIGH (ref 8–23)
CO2: 21 mmol/L — ABNORMAL LOW (ref 22–32)
Calcium: 9.6 mg/dL (ref 8.9–10.3)
Chloride: 98 mmol/L (ref 98–111)
Creatinine, Ser: 0.87 mg/dL (ref 0.44–1.00)
GFR, Estimated: >60 mL/min (ref >60)
Glucose, Bld: 137 mg/dL — ABNORMAL HIGH (ref 70–99)
Potassium: 5.2 mmol/L — ABNORMAL HIGH (ref 3.5–5.1)
Sodium: 132 mmol/L — ABNORMAL LOW (ref 135–145)
Total Bilirubin: 0.7 mg/dL (ref 0.0–1.2)
Total Protein: 7.4 g/dL (ref 6.5–8.1)

## 2023-12-18 MED ORDER — CIPROFLOXACIN HCL 500 MG PO TABS: 500.0000 mg | ORAL_TABLET | Freq: Two times a day (BID) | ORAL | 0 refills | Status: DC

## 2023-12-18 MED ORDER — CEFUROXIME AXETIL 500 MG PO TABS: 500.0000 mg | ORAL_TABLET | Freq: Two times a day (BID) | ORAL | 0 refills | Status: DC

## 2023-12-18 NOTE — ED Notes (Signed)
PA talking to family

## 2023-12-18 NOTE — ED Triage Notes (Signed)
Pt bib [facility] from home. Son took urine sample to pcp and was advised to come to ED due to UTI. Pt normal is confused per family. Pt arrived alert.

## 2023-12-18 NOTE — ED Provider Notes (Addendum)
[place] EMERGENCY DEPARTMENT AT [facility] Provider Note   CSN: [id number] Arrival date & time: 12/18/23  1439     History  Chief Complaint  Patient presents with   Urinary Tract Infection    [name] is a 89 y.o. female.   Urinary Tract Infection  Patient is a 89 year old female presents the ED today with her daughter for UTI discovered by PCP.  Patient has chronic cystitis and has been treated with Bactrim at baseline daily.  Currently patient is expressing mild suprapubic discomfort and dysuria.  However daughter states that she has not had any altered mental status as of this month time.  But has expressed mild lower extremity weakness.  Daughter has refused stroke workup as she says that she always gets worked up for stroke and is negative.  Denies fever, cough, congestion, chest pain, shortness of breath, headache, diarrhea, one-sided weakness.    Home Medications Prior to Admission medications   Medication Sig Start Date End Date Taking? Authorizing Provider  ciprofloxacin (CIPRO) 500 MG tablet Take 1 tablet (500 mg total) by mouth every 12 (twelve) hours. 12/18/23  Yes Lunette Stands, PA-C  acetaminophen (TYLENOL) 325 MG tablet Take 2 tablets (650 mg total) by mouth every 6 (six) hours as needed for mild pain, fever or headache (or Fever >/= 101). 10/08/22   Shon Hale, MD  acidophilus (RISAQUAD) CAPS capsule Take 1 capsule by mouth daily.    [provider]  apixaban (ELIQUIS) 2.5 MG TABS tablet Take 1 tablet (2.5 mg total) by mouth 2 (two) times daily. 12/09/23   Jonelle Sidle, MD  atenolol (TENORMIN) 50 MG tablet TAKE 1 TABLET BY MOUTH TWICE A DAY 12/11/23   Jonelle Sidle, MD  D-MANNOSE PO Take 1 capsule by mouth with breakfast, with lunch, and with evening meal.    [provider]  diltiazem (CARDIZEM CD) 120 MG 24 hr capsule TAKE 1 CAPSULE BY MOUTH EVERY DAY 08/12/23   Jonelle Sidle, MD  metFORMIN (GLUCOPHAGE) 500  MG tablet Take 500-1,000 mg by mouth See admin instructions. 1000 mg in the morning, 500 mg at bedtime.    [provider]  Multiple Vitamin (MULTIVITAMIN) LIQD Take 15 mLs by mouth daily. 11/27/22   Kendell Bane, MD      Allergies    Macrobid [nitrofurantoin] and Toprol xl [metoprolol succinate]    Review of Systems   Review of Systems  Genitourinary:  Positive for dysuria.  All other systems reviewed and are negative.   Physical Exam Updated Vital Signs BP 137/81 (BP Location: Left Arm)   Pulse 99   Temp 98 F (36.7 C) (Oral)   Resp 16   SpO2 96%  Physical Exam Vitals and nursing note reviewed.  Constitutional:      Appearance: Normal appearance.  HENT:     Head: Normocephalic and atraumatic.  Eyes:     Extraocular Movements: Extraocular movements intact.     Conjunctiva/sclera: Conjunctivae normal.  Cardiovascular:     Rate and Rhythm: Normal rate and regular rhythm.     Pulses: Normal pulses.     Heart sounds: Normal heart sounds. No murmur heard.    No friction rub. No gallop.  Pulmonary:     Effort: Pulmonary effort is normal. No respiratory distress.     Breath sounds: Normal breath sounds.  Abdominal:     General: Abdomen is flat.     Palpations: Abdomen is soft.  Tenderness: There is abdominal tenderness (Suprapubic tenderness noted to palpation).  Musculoskeletal:        General: No tenderness.     Right lower leg: No edema.     Left lower leg: No edema.  Skin:    General: Skin is warm and dry.     Coloration: Skin is not jaundiced or pale.     Findings: No erythema.  Neurological:     General: No focal deficit present.     Mental Status: She is alert and oriented to person, place, and time. Mental status is at baseline.     Sensory: No sensory deficit.     Motor: Weakness (4 out of 5 strength noted bilaterally to lower extremities) present.     Coordination: Coordination normal.  Psychiatric:        Mood and Affect: Mood normal.      ED Results / Procedures / Treatments   Labs (all labs ordered are listed, but only abnormal results are displayed) Labs Reviewed  URINALYSIS, ROUTINE W REFLEX MICROSCOPIC - Abnormal; Notable for the following components:      Result Value   APPearance CLOUDY (*)    Glucose, UA 50 (*)    Hgb urine dipstick SMALL (*)    Protein, ur 100 (*)    Leukocytes,Ua LARGE (*)    All other components within normal limits  CBC WITH DIFFERENTIAL/PLATELET - Abnormal; Notable for the following components:   RBC 3.49 (*)    Hemoglobin 10.9 (*)    HCT 33.5 (*)    Platelets 411 (*)    All other components within normal limits  COMPREHENSIVE METABOLIC PANEL - Abnormal; Notable for the following components:   Sodium 132 (*)    Potassium 5.2 (*)    CO2 21 (*)    Glucose, Bld 137 (*)    BUN 24 (*)    All other components within normal limits  URINE CULTURE    EKG None  Radiology No results found.  Procedures Procedures    Medications Ordered in ED Medications - No data to display  ED Course/ Medical Decision Making/ A&P                               Medical Decision Making Amount and/or Complexity of Data Reviewed Labs: ordered.  Risk Prescription drug management.  This patient is a 89 year old female who presents to the ED for concern of positive UTI on PCP workup.   Differential diagnoses prior to evaluation: The emergent differential diagnosis includes, but is not limited to, UTI, sepsis, pyelonephritis, kidney stone. This is not an exhaustive differential.   Past Medical History / Co-morbidities / Social History: HTN, CAD,  A-fib, B12 deficiency, type 2 diabetes, diverticulitis, sepsis secondary to UTI, diastolic CHF, pyelonephritis  Additional history: Chart reviewed. Pertinent results include: Patient was seen in the ED on 06/26/2023 for a UTI and was treated outpatient.  Patient was noted to have altered mental status at that time. Patient was also noted to be seen  on 12//2024 for dysuria.  However no other information was noted in Care Everywhere. Patient was also noted to be seen on 10/02/2023 for cystitis.  Further information was not noted on Care Everywhere.   Lab Tests/Imaging studies: I personally interpreted labs/imaging and the pertinent results include:   CBC was notable for mild anemia CMP was notable for mildly elevated BUN.  Otherwise unremarkable UA was notable for hemoglobin,  bacteremia. Cultures pending   Medications: I ordered medication including cefuroxime for UTI.  I have reviewed the patients home medicines and have made adjustments as needed.  ED Course:  Patient is a 89 year old female presents the ED today complaining of a ultimately status, increased lower extremity weakness, and suprapubic pain for the last 2 days.  She is accompanied by her daughter.  Patient has a previous medical history of HTN, CAD,  A-fib, B12 deficiency, type 2 diabetes, diverticulitis, sepsis secondary to UTI, diastolic CHF, pyelonephritis.  She also has a history of chronic cystitis and has been seen multiple times for this issue.  Had been previously septic in the past secondary to UTI.  On daily Bactrim.  Daughter states that she is usually treated with Cipro but has been shown to be sensitive to amoxicillin the last 2 episodes.  She also states that she is usually worked up for stroke and shown to be negative.  She does not wish to have her worked up for stroke at this time.  Daughter and patient were given explanation that this could possibly be a stroke but patient and daughter still refused to be worked up for stroke due to her age and to having been negative the last few times they have been to the ER.  Endorses dysuria. Denies fever, cough, congestion, shortness of breath, chest pain, abnormal bowel movements.  On physical exam, patient was noted to have 4 out of 5 strength noted bilaterally in lower extremities and upper extremities with normal  sensation bilaterally.  Suprapubic discomfort was also noted to palpation.  Exam was otherwise unremarkable.  Labs were notable for mild anemia, Mildly elevated BUN from baseline Notable UTI  Discussed with patient daughter that she did not want to be discharged with ciprofloxacin.  As she has had multiple treatments with this without any relief and had always been treated with oxacillin.  Will go ahead and treat with cefuroxime with urine culture pending.  Patient and daughter expressed agreement understanding of plan.  Patient vital signs remained stable to the course of her time here.  I believe patient is stable to be discharged at this time.  Provided strict return to ER precautions.  Vomiting discharge, patient and patient's daughter wished to not be prescribed cefuroxime due to diarrhea.  They wish to be continuing on ciprofloxacin until culture sensitivities are back.  I expressed the dangers of ciprofloxacin this patient likely in geriatric patients and they still wish to be on this medication.  She has been on it before and has tolerated it well.  I have since then represcribed the ciprofloxacin and stopped the cefuroxime.     Disposition: After consideration of the diagnostic results and the patients response to treatment, I feel that patient went from discharge and treatment as above.   emergency department workup does not suggest an emergent condition requiring admission or immediate intervention beyond what has been performed at this time. The plan is: Cefuroxime for UTI, follow-up with PCP, return for new or worsening symptoms. The patient is safe for discharge and has been instructed to return immediately for worsening symptoms, change in symptoms or any other concerns.  Final Clinical Impression(s) / ED Diagnoses Final diagnoses:  Acute cystitis without hematuria    Rx / DC Orders ED Discharge Orders          Ordered    cefUROXime (CEFTIN) 500 MG tablet  2 times daily with  meals,   Status:  Discontinued  12/18/23 1852    ciprofloxacin (CIPRO) 500 MG tablet  Every 12 hours        12/18/23 1950              Lunette Stands, PA-C 12/18/23 1857    Lunette Stands, [place] 12/18/23 1954    Vanetta Mulders, MD 12/20/23 [phone]

## 2023-12-18 NOTE — Discharge Instructions (Addendum)
You were seen here for a UTI.  Due to you not want to be prescribed cefuroxime and instead wish to take, I am represcribing a ciprofloxacin which you are to take for the next 10 days twice a day.  Once the cultures are finished and sensitivity has been found, they will reach out to you to change medication. If she begins experiencing confusion, rash, chest pain, shortness of breath, return to the ED for immediate evaluation.  And stop the medication.  Due to her having another UTI, recommend that you follow-up with your PCP to see if a urology follow-up would be needed for her to prevent further infections in the future.  If she begins to have worsening weakness, worsening confusion, fever, worsening pain return to the ED for immediate evaluation.  It was a pleasure seeing the ER today.

## 2023-12-18 NOTE — ED Notes (Signed)
Patient family has concerns about antibiotic that is ordered and would like a different one. Provider messaged.

## 2023-12-21 LAB — URINE CULTURE: Culture: >=100000 — AB

## 2023-12-22 ENCOUNTER — Telehealth (HOSPITAL_BASED_OUTPATIENT_CLINIC_OR_DEPARTMENT_OTHER): Payer: Self-pay | Admitting: *Deleted

## 2023-12-22 NOTE — Telephone Encounter (Signed)
Post ED Visit - Positive Culture Follow-up: Unsuccessful Patient Follow-up  Culture assessed and recommendations reviewed by:  [x]  Delmar Landau, Pharm.D. []  Celedonio Miyamoto, Pharm.D., BCPS AQ-ID []  Garvin Fila, Pharm.D., BCPS []  Georgina Pillion, Pharm.D., BCPS []  [place], [address].D., BCPS, AAHIVP []  Estella Husk, Pharm.D., BCPS, AAHIVP []  Sherlynn Carbon, PharmD []  Pollyann Samples, PharmD, BCPS  Positive urine culture  []  Patient discharged without antimicrobial prescription and treatment is now indicated [x]  Organism is resistant to prescribed ED discharge antimicrobial []  Patient with positive blood cultures   Unable to contact patient, letter will be sent to address on file   Plan: Stop Cipro Start Amoxicillin  500mg  Q8H x 5 days. (Qty 15) per Dr. Gerhard Munch  [name] 12/22/2023, 1:57 PM

## 2023-12-22 NOTE — Progress Notes (Signed)
ED Antimicrobial Stewardship Positive Culture Follow Up   [name] is an 89 y.o. female who presented to [facility] on @ADMITDT @ with a chief complaint of  Chief Complaint  Patient presents with   Urinary Tract Infection    Recent Results (from the past 720 hours)  Urine Culture     Status: Abnormal   Collection Time: 12/18/23  4:05 PM   Specimen: Urine, Clean Catch  Result Value Ref Range Status   Specimen Description   Final    URINE, CLEAN CATCH Performed at [facility], [address] [place], [place] [address]    Special Requests   Final    NONE Performed at [facility], [address], [place], [place] [address]    Culture >=100,000 COLONIES/mL ENTEROCOCCUS FAECALIS (A)  Final   Report Status 12/21/2023 FINAL  Final   Organism ID, Bacteria ENTEROCOCCUS FAECALIS (A)  Final      Susceptibility   Enterococcus faecalis - MIC*    AMPICILLIN <=2 SENSITIVE Sensitive     NITROFURANTOIN <=16 SENSITIVE Sensitive     VANCOMYCIN 1 SENSITIVE Sensitive     * >=100,000 COLONIES/mL ENTEROCOCCUS FAECALIS    [x]  Treated with Ciprofloxacin, organism resistant to prescribed antimicrobial  There are only 2 oral options (amoxicillin/ampicillin or linezolid) to treat this isolate (technically could try fosfomycin but we would not have culture data to support that decision).  STOP Ciprofloxacin (the cefuroxime prescribed but decided against would also not work) START Amoxicillin 500 mg q8h for 5 days (qty 15; refills 0)  ED Provider: Carles Collet, PharmD, BCPS 12/22/2023 11:50 AM ED Clinical Pharmacist -  [phone]

## 2023-12-25 ENCOUNTER — Telehealth (HOSPITAL_BASED_OUTPATIENT_CLINIC_OR_DEPARTMENT_OTHER): Payer: Self-pay

## 2023-12-25 NOTE — Telephone Encounter (Signed)
 Post ED Visit - Positive Culture Follow-up: Successful Patient Follow-Up  Culture assessed and recommendations reviewed by:  []  Rankin Dee, Pharm.D. []  Venetia Gully, Pharm.D., BCPS AQ-ID []  Garrel Crews, Pharm.D., BCPS []  Almarie Lunger, Pharm.D., BCPS []  [place], [place].D., BCPS, AAHIVP []  Rosaline Bihari, Pharm.D., BCPS, AAHIVP []  Vernell Meier, PharmD, BCPS []  Latanya Hint, PharmD, BCPS []  Donald Medley, PharmD, BCPS []  Rocky Bold, PharmD  Positive urine culture  []  Patient discharged without antimicrobial prescription and treatment is now indicated [x]  Organism is resistant to prescribed ED discharge antimicrobial []  Patient with positive blood cultures  Changes discussed with ED provider: Lamar Salen, MD New antibiotic prescription Amoxicillin  500 mg po Q 8 hours x 5 days  [facility] [place], [place]  Contacted patient, date 12/25/2023, time 9:30 am   [name] 12/25/2023, 9:35 AM

## 2024-01-06 DIAGNOSIS — R300 Dysuria: Secondary | ICD-10-CM | POA: Diagnosis not present

## 2024-01-06 DIAGNOSIS — R509 Fever, unspecified: Secondary | ICD-10-CM | POA: Diagnosis not present

## 2024-01-14 ENCOUNTER — Telehealth: Payer: Self-pay | Admitting: *Deleted

## 2024-01-14 NOTE — Telephone Encounter (Signed)
 Spoke with pt's daughter and notified that pt was denied Eliquis patient assistance d/t not meeting the out of pocket of $ 250.53. Daughter states that she spoke with [employer] and was told pt needs out of pocket of $137.

## 2024-01-24 DIAGNOSIS — R300 Dysuria: Secondary | ICD-10-CM | POA: Diagnosis not present

## 2024-02-24 ENCOUNTER — Telehealth: Payer: Self-pay | Admitting: Cardiology

## 2024-02-24 NOTE — Telephone Encounter (Signed)
  [name] called, she said, she will dropped off paperwork tomorrow to fax docs to [facility]

## 2024-02-24 NOTE — Telephone Encounter (Signed)
 noted

## 2024-02-27 NOTE — Telephone Encounter (Signed)
Returned call to daughter. No answer. Left msg to call back.  

## 2024-02-27 NOTE — Telephone Encounter (Signed)
 Returned call to daughter and notified that out of pocket information faxed to [employer] ( Eliquis )

## 2024-02-27 NOTE — Telephone Encounter (Signed)
 Daughter Clifton James) called to follow-up with RN Bradly Bienenstock to confirm receipt of documentation she dropped off.

## 2024-03-15 ENCOUNTER — Inpatient Hospital Stay (HOSPITAL_COMMUNITY)
Admission: EM | Admit: 2024-03-15 | Discharge: 2024-03-18 | DRG: 643 | Disposition: A | Discharge status: Home Health | Attending: Family Medicine | Admitting: Family Medicine

## 2024-03-15 ENCOUNTER — Emergency Department (HOSPITAL_COMMUNITY)

## 2024-03-15 ENCOUNTER — Encounter (HOSPITAL_COMMUNITY): Payer: Self-pay | Admitting: Emergency Medicine

## 2024-03-15 ENCOUNTER — Other Ambulatory Visit: Payer: Self-pay

## 2024-03-15 DIAGNOSIS — Z90710 Acquired absence of both cervix and uterus: Secondary | ICD-10-CM | POA: Diagnosis not present

## 2024-03-15 DIAGNOSIS — R0989 Other specified symptoms and signs involving the circulatory and respiratory systems: Secondary | ICD-10-CM | POA: Diagnosis not present

## 2024-03-15 DIAGNOSIS — I10 Essential (primary) hypertension: Secondary | ICD-10-CM | POA: Diagnosis not present

## 2024-03-15 DIAGNOSIS — G9341 Metabolic encephalopathy: Secondary | ICD-10-CM | POA: Diagnosis present

## 2024-03-15 DIAGNOSIS — Z96659 Presence of unspecified artificial knee joint: Secondary | ICD-10-CM | POA: Diagnosis present

## 2024-03-15 DIAGNOSIS — Z79899 Other long term (current) drug therapy: Secondary | ICD-10-CM

## 2024-03-15 DIAGNOSIS — I4891 Unspecified atrial fibrillation: Secondary | ICD-10-CM | POA: Diagnosis present

## 2024-03-15 DIAGNOSIS — I4719 Other supraventricular tachycardia: Secondary | ICD-10-CM | POA: Diagnosis present

## 2024-03-15 DIAGNOSIS — E785 Hyperlipidemia, unspecified: Secondary | ICD-10-CM | POA: Diagnosis present

## 2024-03-15 DIAGNOSIS — Z1152 Encounter for screening for COVID-19: Secondary | ICD-10-CM

## 2024-03-15 DIAGNOSIS — F0390 Unspecified dementia without behavioral disturbance: Secondary | ICD-10-CM | POA: Diagnosis present

## 2024-03-15 DIAGNOSIS — E119 Type 2 diabetes mellitus without complications: Secondary | ICD-10-CM | POA: Diagnosis present

## 2024-03-15 DIAGNOSIS — R0602 Shortness of breath: Secondary | ICD-10-CM | POA: Diagnosis not present

## 2024-03-15 DIAGNOSIS — N390 Urinary tract infection, site not specified: Secondary | ICD-10-CM | POA: Diagnosis present

## 2024-03-15 DIAGNOSIS — Z66 Do not resuscitate: Secondary | ICD-10-CM | POA: Diagnosis present

## 2024-03-15 DIAGNOSIS — I5032 Chronic diastolic (congestive) heart failure: Secondary | ICD-10-CM | POA: Diagnosis present

## 2024-03-15 DIAGNOSIS — R809 Proteinuria, unspecified: Secondary | ICD-10-CM | POA: Diagnosis present

## 2024-03-15 DIAGNOSIS — Z7984 Long term (current) use of oral hypoglycemic drugs: Secondary | ICD-10-CM | POA: Diagnosis not present

## 2024-03-15 DIAGNOSIS — Z7901 Long term (current) use of anticoagulants: Secondary | ICD-10-CM

## 2024-03-15 DIAGNOSIS — R000 Tachycardia, unspecified: Secondary | ICD-10-CM | POA: Diagnosis not present

## 2024-03-15 DIAGNOSIS — Z8249 Family history of ischemic heart disease and other diseases of the circulatory system: Secondary | ICD-10-CM | POA: Diagnosis not present

## 2024-03-15 DIAGNOSIS — R531 Weakness: Secondary | ICD-10-CM | POA: Diagnosis not present

## 2024-03-15 DIAGNOSIS — E871 Hypo-osmolality and hyponatremia: Secondary | ICD-10-CM | POA: Diagnosis not present

## 2024-03-15 DIAGNOSIS — Z888 Allergy status to other drugs, medicaments and biological substances status: Secondary | ICD-10-CM

## 2024-03-15 DIAGNOSIS — J441 Chronic obstructive pulmonary disease with (acute) exacerbation: Secondary | ICD-10-CM | POA: Diagnosis present

## 2024-03-15 DIAGNOSIS — R062 Wheezing: Secondary | ICD-10-CM | POA: Diagnosis not present

## 2024-03-15 DIAGNOSIS — I110 Hypertensive heart disease with heart failure: Secondary | ICD-10-CM | POA: Diagnosis present

## 2024-03-15 DIAGNOSIS — I2510 Atherosclerotic heart disease of native coronary artery without angina pectoris: Secondary | ICD-10-CM | POA: Diagnosis present

## 2024-03-15 DIAGNOSIS — E222 Syndrome of inappropriate secretion of antidiuretic hormone: Secondary | ICD-10-CM | POA: Diagnosis present

## 2024-03-15 DIAGNOSIS — R0603 Acute respiratory distress: Secondary | ICD-10-CM | POA: Diagnosis not present

## 2024-03-15 DIAGNOSIS — R9389 Abnormal findings on diagnostic imaging of other specified body structures: Secondary | ICD-10-CM | POA: Diagnosis not present

## 2024-03-15 DIAGNOSIS — D638 Anemia in other chronic diseases classified elsewhere: Secondary | ICD-10-CM | POA: Diagnosis present

## 2024-03-15 DIAGNOSIS — Z8673 Personal history of transient ischemic attack (TIA), and cerebral infarction without residual deficits: Secondary | ICD-10-CM

## 2024-03-15 LAB — BRAIN NATRIURETIC PEPTIDE: B Natriuretic Peptide: 233 pg/mL — ABNORMAL HIGH (ref 0.0–100.0)

## 2024-03-15 LAB — COMPREHENSIVE METABOLIC PANEL WITH GFR
ALT: 14 U/L (ref 0–44)
AST: 22 U/L (ref 15–41)
Albumin: 3.6 g/dL (ref 3.5–5.0)
Alkaline Phosphatase: 45 U/L (ref 38–126)
Anion gap: 13 (ref 5–15)
BUN: 15 mg/dL (ref 8–23)
CO2: 18 mmol/L — ABNORMAL LOW (ref 22–32)
Calcium: 8.6 mg/dL — ABNORMAL LOW (ref 8.9–10.3)
Chloride: 86 mmol/L — ABNORMAL LOW (ref 98–111)
Creatinine, Ser: 0.6 mg/dL (ref 0.44–1.00)
GFR, Estimated: >60 mL/min (ref >60)
Glucose, Bld: 185 mg/dL — ABNORMAL HIGH (ref 70–99)
Potassium: 4 mmol/L (ref 3.5–5.1)
Sodium: 117 mmol/L — CL (ref 135–145)
Total Bilirubin: 0.6 mg/dL (ref 0.0–1.2)
Total Protein: 7.1 g/dL (ref 6.5–8.1)

## 2024-03-15 LAB — URINALYSIS, ROUTINE W REFLEX MICROSCOPIC
Bacteria, UA: NONE SEEN
Bilirubin Urine: NEGATIVE
Glucose, UA: NEGATIVE mg/dL
Ketones, ur: 5 mg/dL — AB
Nitrite: NEGATIVE
Protein, ur: 100 mg/dL — AB
Specific Gravity, Urine: 1.014 (ref 1.005–1.030)
WBC, UA: >50 WBC/hpf (ref 0–5)
pH: 6 (ref 5.0–8.0)

## 2024-03-15 LAB — OSMOLALITY, URINE: Osmolality, Ur: 438 mosm/kg (ref 300–900)

## 2024-03-15 LAB — CBC
HCT: 32 % — ABNORMAL LOW (ref 36.0–46.0)
Hemoglobin: 10.6 g/dL — ABNORMAL LOW (ref 12.0–15.0)
MCH: 30.7 pg (ref 26.0–34.0)
MCHC: 33.1 g/dL (ref 30.0–36.0)
MCV: 92.8 fL (ref 80.0–100.0)
Platelets: 240 10*3/uL (ref 150–400)
RBC: 3.45 MIL/uL — ABNORMAL LOW (ref 3.87–5.11)
RDW: 12.4 % (ref 11.5–15.5)
WBC: 6.3 10*3/uL (ref 4.0–10.5)
nRBC: 0 % (ref 0.0–0.2)

## 2024-03-15 LAB — GLUCOSE, CAPILLARY
Glucose-Capillary: 289 mg/dL — ABNORMAL HIGH (ref 70–99)
Glucose-Capillary: 223 mg/dL — ABNORMAL HIGH (ref 70–99)

## 2024-03-15 LAB — SODIUM, URINE, RANDOM: Sodium, Ur: 90 mmol/L

## 2024-03-15 LAB — CORTISOL-AM, BLOOD: Cortisol - AM: 27.2 ug/dL — ABNORMAL HIGH (ref 6.7–22.6)

## 2024-03-15 LAB — TROPONIN I (HIGH SENSITIVITY)
Troponin I (High Sensitivity): 4 ng/L (ref <18)
Troponin I (High Sensitivity): 4 ng/L (ref <18)

## 2024-03-15 LAB — TSH: TSH: 1.807 u[IU]/mL (ref 0.350–4.500)

## 2024-03-15 LAB — MRSA NEXT GEN BY PCR, NASAL: MRSA by PCR Next Gen: NOT DETECTED

## 2024-03-15 LAB — RESP PANEL BY RT-PCR (RSV, FLU A&B, COVID)  RVPGX2
Influenza A by PCR: NEGATIVE
Influenza B by PCR: NEGATIVE
Resp Syncytial Virus by PCR: NEGATIVE
SARS Coronavirus 2 by RT PCR: NEGATIVE

## 2024-03-15 LAB — OSMOLALITY: Osmolality: 258 mosm/kg — ABNORMAL LOW (ref 275–295)

## 2024-03-15 LAB — SODIUM
Sodium: 117 mmol/L — CL (ref 135–145)
Sodium: 118 mmol/L — CL (ref 135–145)
Sodium: 118 mmol/L — CL (ref 135–145)

## 2024-03-15 LAB — PROTEIN / CREATININE RATIO, URINE
Creatinine, Urine: 45 mg/dL
Protein Creatinine Ratio: 2.29 mg/mg{creat} — ABNORMAL HIGH (ref 0.00–0.15)
Total Protein, Urine: 103 mg/dL

## 2024-03-15 MED ORDER — SODIUM CHLORIDE 1 G PO TABS
2.0000 g | ORAL_TABLET | Freq: Once | ORAL | Status: AC
  Administered 2024-03-15: 2 g via ORAL
  Filled 2024-03-15: qty 2

## 2024-03-15 MED ORDER — SODIUM CHLORIDE 0.9 % IV BOLUS
500.0000 mL | Freq: Once | INTRAVENOUS | Status: AC
  Administered 2024-03-15: 500 mL via INTRAVENOUS

## 2024-03-15 MED ORDER — LEVALBUTEROL HCL 0.63 MG/3ML IN NEBU
0.6300 mg | INHALATION_SOLUTION | Freq: Four times a day (QID) | RESPIRATORY_TRACT | Status: DC | PRN
  Administered 2024-03-16: 0.63 mg via RESPIRATORY_TRACT
  Filled 2024-03-15: qty 3

## 2024-03-15 MED ORDER — DILTIAZEM HCL-DEXTROSE 125-5 MG/125ML-% IV SOLN (PREMIX)
5.0000 mg/h | INTRAVENOUS | Status: DC
  Administered 2024-03-15: 5 mg/h via INTRAVENOUS
  Filled 2024-03-15: qty 125

## 2024-03-15 MED ORDER — SODIUM CHLORIDE 0.9 % IV SOLN: Freq: Once | INTRAVENOUS | Status: AC

## 2024-03-15 MED ORDER — SODIUM CHLORIDE 0.9 % IV SOLN
Freq: Once | INTRAVENOUS | Status: AC
  Administered 2024-03-15: 100 mL via INTRAVENOUS

## 2024-03-15 MED ORDER — METHYLPREDNISOLONE SODIUM SUCC 125 MG IJ SOLR
125.0000 mg | Freq: Once | INTRAMUSCULAR | Status: AC
  Administered 2024-03-15: 125 mg via INTRAVENOUS
  Filled 2024-03-15: qty 2

## 2024-03-15 MED ORDER — DILTIAZEM HCL ER COATED BEADS 120 MG PO CP24
120.0000 mg | ORAL_CAPSULE | Freq: Every day | ORAL | Status: DC
  Administered 2024-03-15 – 2024-03-18 (×4): 120 mg via ORAL
  Filled 2024-03-15 (×4): qty 1

## 2024-03-15 MED ORDER — DOXYCYCLINE HYCLATE 100 MG PO TABS
100.0000 mg | ORAL_TABLET | Freq: Two times a day (BID) | ORAL | Status: DC
  Administered 2024-03-15 – 2024-03-18 (×7): 100 mg via ORAL
  Filled 2024-03-15 (×7): qty 1

## 2024-03-15 MED ORDER — INSULIN ASPART 100 UNIT/ML IJ SOLN
0.0000 [IU] | Freq: Every day | INTRAMUSCULAR | Status: DC
  Administered 2024-03-15: 2 [IU] via SUBCUTANEOUS

## 2024-03-15 MED ORDER — INSULIN ASPART 100 UNIT/ML IJ SOLN
0.0000 [IU] | Freq: Three times a day (TID) | INTRAMUSCULAR | Status: DC
  Administered 2024-03-15: 5 [IU] via SUBCUTANEOUS
  Administered 2024-03-16 (×2): 2 [IU] via SUBCUTANEOUS
  Administered 2024-03-17: 1 [IU] via SUBCUTANEOUS
  Administered 2024-03-17: 2 [IU] via SUBCUTANEOUS
  Administered 2024-03-17: 1 [IU] via SUBCUTANEOUS
  Administered 2024-03-18: 2 [IU] via SUBCUTANEOUS
  Administered 2024-03-18: 1 [IU] via SUBCUTANEOUS

## 2024-03-15 MED ORDER — ACETAMINOPHEN 650 MG RE SUPP: 650.0000 mg | Freq: Four times a day (QID) | RECTAL | Status: DC | PRN

## 2024-03-15 MED ORDER — APIXABAN 2.5 MG PO TABS
2.5000 mg | ORAL_TABLET | Freq: Two times a day (BID) | ORAL | Status: DC
  Administered 2024-03-15 – 2024-03-18 (×6): 2.5 mg via ORAL
  Filled 2024-03-15 (×6): qty 1

## 2024-03-15 MED ORDER — ATENOLOL 25 MG PO TABS
50.0000 mg | ORAL_TABLET | Freq: Two times a day (BID) | ORAL | Status: DC
  Administered 2024-03-15 – 2024-03-18 (×7): 50 mg via ORAL
  Filled 2024-03-15 (×7): qty 2

## 2024-03-15 MED ORDER — DILTIAZEM LOAD VIA INFUSION
10.0000 mg | Freq: Once | INTRAVENOUS | Status: AC
  Administered 2024-03-15: 10 mg via INTRAVENOUS
  Filled 2024-03-15: qty 10

## 2024-03-15 MED ORDER — SODIUM CHLORIDE 0.9% FLUSH
3.0000 mL | Freq: Two times a day (BID) | INTRAVENOUS | Status: DC
  Administered 2024-03-15 – 2024-03-18 (×7): 3 mL via INTRAVENOUS

## 2024-03-15 MED ORDER — CHLORHEXIDINE GLUCONATE CLOTH 2 % EX PADS
6.0000 | MEDICATED_PAD | Freq: Every day | CUTANEOUS | Status: DC
  Administered 2024-03-15 – 2024-03-18 (×3): 6 via TOPICAL

## 2024-03-15 MED ORDER — SODIUM CHLORIDE 3 % IV SOLN
INTRAVENOUS | Status: DC
  Filled 2024-03-15 (×4): qty 500

## 2024-03-15 MED ORDER — ACETAMINOPHEN 325 MG PO TABS
650.0000 mg | ORAL_TABLET | Freq: Four times a day (QID) | ORAL | Status: DC | PRN
  Administered 2024-03-17: 650 mg via ORAL
  Filled 2024-03-15: qty 2

## 2024-03-15 MED ORDER — IPRATROPIUM-ALBUTEROL 0.5-2.5 (3) MG/3ML IN SOLN
6.0000 mL | Freq: Once | RESPIRATORY_TRACT | Status: AC
  Administered 2024-03-15: 6 mL via RESPIRATORY_TRACT
  Filled 2024-03-15: qty 6

## 2024-03-15 NOTE — Significant Event (Signed)
 Significant event note:   Na remains borderline severe range; 120 corrected for glucose. Considering her acute on chronic encephalopathy, studies consistent with SIADH and limited improvement with saline think she would benefit from hypertonic saline. Spoke with Dr. Zelda Hickman (Nephrology) agrees with hypertonic saline at 30 cc /hr, with q2-4 hr Na checks, and will have Nephrology see patient in the morning. Escalated to ICU for hypertonic.   Arnulfo Larch, MD  [facility]

## 2024-03-15 NOTE — Progress Notes (Signed)
 Informed by primary service in regards to acute on chronic encephalopathy (h/o dementia) in the context of severe hyponatremia. Given mental status changes, it is reasonable to start hypertonic saline. Would start slow at 30cc/hr (no bolus) especially given her age. Studies thus far point towards SIADH. Monitor Na every 2-4 hrs. Discussed with primary service. Full consult to follow in AM.  Cristi Donalds, MD [facility]

## 2024-03-15 NOTE — ED Triage Notes (Signed)
 Pt bib rcems with c/o congestion and audible wheezing. Pt has a hx of afib and dementia. Pt family has been treating with robitussin.pt wheezing can be heard with the naked ear. Pt is currently in afib.

## 2024-03-15 NOTE — H&P (Signed)
 History and Physical    Patient: [name] OZD:[id number] DOB: [birth year] DOA: 03/15/2024 DOS: the patient was seen and examined on 03/15/2024 PCP: Orest Bio, MD  Patient coming from: Home  Chief Complaint:  Chief Complaint  Patient presents with   Wheezing   HPI: [name] is a 89 y.o. female with a history of dementia, AFib, NIDT2DM, TIA who was brought to the ED this morning from home by EMS due to wheezing. Her daughter with whom she lives provides history in addition to EDP discussion and chart review. The patient has had several days of nasal congestion, poor oral intake, cough treated with robitussin. When she began having wheezing, decision was made to bring her in. She was also pointing at her chest at times, though has not reported chest pain. Has leg swelling which is chronic at baseline.   In the ED at time of admission, pt is resting, awakens and answers yes/no questions, denies current pain or trouble breathing.   In the ED she was in rapid atrial fibrillation with severe hyponatremia (Na 117) noted on labs. Troponin was normal. She had expiratory wheezing for which duonebs and solumedrol were given. Normal saline and diltiazem  infusion were also started and admission requested.   Review of Systems: unable to review all systems due to the inability of the patient to answer questions. Past Medical History:  Diagnosis Date   Atrial fibrillation (HCC)    Coronary atherosclerosis of native coronary artery    Nonobstructive   Dyslipidemia    PSVT (paroxysmal supraventricular tachycardia) (HCC)    TIA (transient ischemic attack)    Type 2 diabetes mellitus (HCC)    Diet controlled   Past Surgical History:  Procedure Laterality Date   ABDOMINAL HYSTERECTOMY     CARPAL TUNNEL RELEASE     x's 2   CHOLECYSTECTOMY     REPLACEMENT TOTAL KNEE  2002 & 2012   SHOULDER SURGERY  2001   Social History:  reports that she has never smoked. She has never used smokeless  tobacco. She reports that she does not drink alcohol  and does not use drugs.  Allergies  Allergen Reactions   Macrobid [Nitrofurantoin] Diarrhea and Nausea And Vomiting   Toprol  Xl [Metoprolol  Succinate] Other (See Comments)    Hair loss    Family History  Problem Relation Age of Onset   Coronary artery disease Mother     Prior to Admission medications   Medication Sig Start Date End Date Taking? Authorizing Provider  acetaminophen  (TYLENOL ) 325 MG tablet Take 2 tablets (650 mg total) by mouth every 6 (six) hours as needed for mild pain, fever or headache (or Fever >/= 101). 10/08/22   Colin Dawley, MD  acidophilus (RISAQUAD) CAPS capsule Take 1 capsule by mouth daily.    [provider]  apixaban  (ELIQUIS ) 2.5 MG TABS tablet Take 1 tablet (2.5 mg total) by mouth 2 (two) times daily. 12/09/23   Gerard Knight, MD  atenolol  (TENORMIN ) 50 MG tablet TAKE 1 TABLET BY MOUTH TWICE A DAY 12/11/23   Gerard Knight, MD  ciprofloxacin  (CIPRO ) 500 MG tablet Take 1 tablet (500 mg total) by mouth every 12 (twelve) hours. 12/18/23   Bauer, Collin S, PA-C  D-MANNOSE PO Take 1 capsule by mouth with breakfast, with lunch, and with evening meal.    [provider]  diltiazem  (CARDIZEM  CD) 120 MG 24 hr capsule TAKE 1 CAPSULE BY MOUTH EVERY DAY 08/12/23   Gerard Knight,  MD  metFORMIN (GLUCOPHAGE) 500 MG tablet Take 500-1,000 mg by mouth See admin instructions. 1000 mg in the morning, 500 mg at bedtime.    [provider]  Multiple Vitamin (MULTIVITAMIN) LIQD Take 15 mLs by mouth daily. 11/27/22   Bobbetta Burnet, MD    Physical Exam: Vitals:   03/15/24 1045 03/15/24 1100 03/15/24 1115 03/15/24 1130  BP: 133/78 (!) 145/71 128/82 133/64  Pulse: 91 95 (!) 103 85  Resp: (!) 26 (!) 28 (!) 23 20  Temp:      TempSrc:      SpO2: 96% 95% 95% 96%  Gen: Elderly female in no distress Pulm: Clear at bases, diminished at right, expiratory wheezing upper airways, no  crackles  CV: Irreg irreg, rate in 80's, no JVD, 1+ symmetric LE edema GI: Soft, NT, ND, +BS Neuro: Rousable, no tremor, not oriented. No new focal deficits. Ext: Warm, no deformities Skin: No new rashes, lesions or ulcers on visualized skin   Data Reviewed: Na 117, bicarb 18, AG 13, SCr 0.6, BUN 15. Glucose 185. LFTs normal.  WBC 6.3k, hgb 10.6g/dl, plt 875I BNP 433 Troponin 4 Viral panel negative CXR: Chronic right hemidiaphragm elevation, no consolidation or effusions ECG: AFib with ventricular rate 131bpm, submillimeter ST depression in aVF. QTc .  Assessment and Plan: Severe hyponatremia: By history may be volume contraction from poor intake though SIADH also possible with COPD, no nodule/mass on CXR.  - Continue NS challenge started in ED. Modify strategy based on q4h checks. Note hx HFpEF, chronic leg swelling is at her baseline.  - Ordered serum osm, urine lytes/osm, etc. Check TSH, AM cortisol  Atrial fibrillation with RVR: Rate improved with diltiazem , IV fluids.  - Start oral diltiazem  and home atenolol  - Continue DOAC.   Wheezing: Pt has no hx COPD, no hx tobacco use, no inhalers/previous treatments for COPD. Exam more consistent with upper airway irritation.  - Given high dose solumedrol in ED, will plan to hold further steroids for now. - Continue to monitor, can use xopenex  prn though would monitor response closely to avoid unnecessary use of beta agonist. - Covid, RSV, flu panel negative, though do suspect an infectious precipitant. WBC is normal, afebrile, so not septic. Will give empiric doxycycline short course (no azithro with longer QT).   NIDT2DM:  - Sensitive SSI.  - Hold metformin  Nonobstructive CAD: Troponin reassuring, ECG with rapid rate still without diagnostic ischemic changes.  - Continue home beta blocker (atenolol , note allergy "hair loss" to metoprolol ) - Continue DOAC in place of aspirin - No longer on statin due to age.   DNR/DNI:  Goals of care do include desire for hospitalization to treat the treatable at this point.   Acute metabolic encephalopathy on chronic dementia: Not convinced this is symptomatic hyponatremia, though her overall illness is the cause of acute change in mental status most likely.  - Daughter reports similar changes when pt has UTI, will check UA - Delirium precautions.   Weakness: Walker use at baseline, lives with daughter for past 3 years. Has been weaker.  - Get PT/OT evaluations tomorrow  Advance Care Planning: DNR  Consults: None  Family Communication: Daughter at bedside  Severity of Illness: The appropriate patient status for this patient is INPATIENT. Inpatient status is judged to be reasonable and necessary in order to provide the required intensity of service to ensure the patient's safety. The patient's presenting symptoms, physical exam findings, and initial radiographic and laboratory data in the  context of their chronic comorbidities is felt to place them at high risk for further clinical deterioration. Furthermore, it is not anticipated that the patient will be medically stable for discharge from the hospital within 2 midnights of admission.   * I certify that at the point of admission it is my clinical judgment that the patient will require inpatient hospital care spanning beyond 2 midnights from the point of admission due to high intensity of service, high risk for further deterioration and high frequency of surveillance required.*  Author: Wynetta Heckle, MD 03/15/2024 11:44 AM  For on call review [email or web address].

## 2024-03-15 NOTE — TOC Initial Note (Signed)
 Transition of Care [facility]) - Initial/Assessment Note    Patient Details  Name: [name] MRN: [id number] Date of Birth: [birth year]  Transition of Care [facility]) CM/SW Contact:    Lynda Sands, RN Phone Number: 03/15/2024, 6:21 PM  Clinical Narrative:  CM met with patient and daughter -in-law at bedside at bedside. Daughter- in-law reports patient live with her daughter. Patient is hard of hearing. Patient's daughter assist patient with adl's . Patient has been able to ambulate with a rolling walker. Patient daughter provides transportation for patient.              Barriers to Discharge: Continued Medical Work up   Patient Goals and CMS Choice Patient states their goals for this hospitalization and ongoing recovery are:: Daughter-in-law visting patient . Daughter-in-law reports patient has dementia          Expected Discharge Plan and Services       Living arrangements for the past 2 months: Single Family Home                                      Prior Living Arrangements/Services Living arrangements for the past 2 months: Single Family Home Lives with:: Adult Children          Need for Family Participation in Patient Care: Yes (Comment) Care giver support system in place?: Yes (comment)      Activities of Daily Living   ADL Screening (condition at time of admission) Independently performs ADLs?: Yes (appropriate for developmental age) Is the patient deaf or have difficulty hearing?: Yes Does the patient have difficulty seeing, even when wearing glasses/contacts?: Yes Does the patient have difficulty concentrating, remembering, or making decisions?: Yes  Permission Sought/Granted                  Emotional Assessment Appearance:: Appears stated age Attitude/Demeanor/Rapport: Engaged Affect (typically observed): Pleasant Orientation: : Oriented to Self      Admission diagnosis:  Hyponatremia [E87.1] Patient Active Problem List   Diagnosis  Date Noted   Hyponatremia 03/15/2024   Acute pyelonephritis 12/06/2022   Hypoalbuminemia 12/06/2022   Transaminitis 12/06/2022   Hypokalemia 12/06/2022   Atrial fibrillation with RVR (HCC) 12/05/2022   Sepsis (HCC) 11/23/2022   Fall at home, initial encounter 10/04/2022   Hypoalbuminemia due to protein-calorie malnutrition (HCC) 10/04/2022   Sepsis secondary to UTI (HCC) 10/03/2022   Chronic diastolic CHF (congestive heart failure) (HCC) 09/24/2022   COVID-19 virus infection 09/20/2022   Diverticulitis of colon    Pressure injury of skin 08/18/2022   Dyslipidemia    Type 2 diabetes mellitus (HCC)    Hypomagnesemia 05/03/2022   Anemia of chronic disease 05/03/2022   B12 deficiency 05/03/2022   Paroxysmal atrial fibrillation (HCC) 05/02/2022   Acute metabolic encephalopathy 05/01/2022   Acute cystitis without hematuria 12/01/2021   Current use of long term anticoagulation 08/15/2021   Generalized weakness 08/15/2021   Body mass index 29.0-29.9, adult 07/12/2017   Low back pain 07/12/2017   Proteinuria 11/21/2016   Chronic kidney disease, stage III (moderate) (HCC) 11/19/2016   Vascular insufficiency of intestine (HCC) 06/23/2014   Gouty arthropathy 06/11/2014   Osteoarthrosis 07/24/2013   Coronary atherosclerosis of native coronary artery 10/13/2012   Essential hypertension, benign 09/12/2011   PCP:  Orest Bio, MD Pharmacy:   [phone] [place] [place] - [address] [place]  [address] [place] [address] Phone: [phone] Fax: [phone]  [facility] [place] - [address] [place] [address] Phone: [phone] Fax: [phone]     Social Drivers of Health (SDOH) Social History: SDOH Screenings   Food Insecurity: Patient Unable To Answer (03/15/2024)  Housing: Low Risk  (03/15/2024)  Transportation Needs: No Transportation Needs (03/15/2024)  Utilities: Not At Risk  (03/15/2024)  Financial Resource Strain: Low Risk  (07/13/2022)  Social Connections: Unknown (03/15/2024)  Tobacco Use: Low Risk  (03/15/2024)  Health Literacy: Low Risk  (03/04/2020)   Received from [facility], [facility]   SDOH Interventions:     Readmission Risk Interventions    11/24/2022   10:52 AM  Readmission Risk Prevention Plan  Transportation Screening Complete  Medication Review (RN Care Manager) Complete  PCP or Specialist appointment within 3-5 days of discharge Not Complete  HRI or Home Care Consult Complete  SW Recovery Care/Counseling Consult Complete  Palliative Care Screening Not Applicable  Skilled Nursing Facility Not Complete

## 2024-03-15 NOTE — ED Notes (Signed)
 Verbal order to trial pause Cardizem  from Dr. Adan Holms

## 2024-03-15 NOTE — Progress Notes (Signed)
 Messaged Dr. Fausto Hooker with critical result of sodium of 118 which is up 1 point from previous lab of 117.

## 2024-03-15 NOTE — ED Provider Notes (Signed)
 [place] EMERGENCY DEPARTMENT AT [facility] Provider Note   CSN: [id number] Arrival date & time: 03/15/24  8119     History {Add pertinent medical, surgical, social history, OB history to HPI:1} Chief Complaint  Patient presents with   Wheezing    [name] is a 89 y.o. female.  89 year old female with history of atrial fibrillation on Eliquis  and diltiazem , hyperlipidemia, diabetes, and dementia who presents to the emergency department with wheezing.  Per EMS, family called 911 today because of her wheezing.  Does have a history of dementia.  They report that she is at her baseline.  Has been treated with Robitussin.  Patient unable to provide additional history.  Given 1 nebulizer treatments by EMS on the way over.  Additional history later obtained per the patient's daughter.  Says that her mother has been having congestion for the past week as well as a cough.  They live at home together.  Says that this morning she seemed to be slightly more confused than usual and repeatedly pointing to her chest making her think that she was having some chest pain.       Home Medications Prior to Admission medications   Medication Sig Start Date End Date Taking? Authorizing Provider  acetaminophen  (TYLENOL ) 325 MG tablet Take 2 tablets (650 mg total) by mouth every 6 (six) hours as needed for mild pain, fever or headache (or Fever >/= 101). 10/08/22   Colin Dawley, MD  acidophilus (RISAQUAD) CAPS capsule Take 1 capsule by mouth daily.    [provider]  apixaban  (ELIQUIS ) 2.5 MG TABS tablet Take 1 tablet (2.5 mg total) by mouth 2 (two) times daily. 12/09/23   Gerard Knight, MD  atenolol  (TENORMIN ) 50 MG tablet TAKE 1 TABLET BY MOUTH TWICE A DAY 12/11/23   Gerard Knight, MD  ciprofloxacin  (CIPRO ) 500 MG tablet Take 1 tablet (500 mg total) by mouth every 12 (twelve) hours. 12/18/23   Bauer, Collin S, PA-C  D-MANNOSE PO Take 1 capsule by mouth with breakfast,  with lunch, and with evening meal.    [provider]  diltiazem  (CARDIZEM  CD) 120 MG 24 hr capsule TAKE 1 CAPSULE BY MOUTH EVERY DAY 08/12/23   Gerard Knight, MD  metFORMIN (GLUCOPHAGE) 500 MG tablet Take 500-1,000 mg by mouth See admin instructions. 1000 mg in the morning, 500 mg at bedtime.    [provider]  Multiple Vitamin (MULTIVITAMIN) LIQD Take 15 mLs by mouth daily. 11/27/22   Bobbetta Burnet, MD      Allergies    Macrobid [nitrofurantoin] and Toprol  xl [metoprolol  succinate]    Review of Systems   Review of Systems  Physical Exam Updated Vital Signs SpO2 94%  Physical Exam Vitals and nursing note reviewed.  Constitutional:      General: She is not in acute distress.    Appearance: She is well-developed.  HENT:     Head: Normocephalic and atraumatic.     Right Ear: External ear normal.     Left Ear: External ear normal.     Nose: Nose normal.  Eyes:     Extraocular Movements: Extraocular movements intact.     Conjunctiva/sclera: Conjunctivae normal.     Pupils: Pupils are equal, round, and reactive to light.  Cardiovascular:     Rate and Rhythm: Normal rate and regular rhythm.     Heart sounds: No murmur heard. Pulmonary:     Effort: Pulmonary effort is normal. No respiratory  distress.     Breath sounds: Wheezing (diffuse expiratory) present.  Musculoskeletal:     Cervical back: Normal range of motion and neck supple.     Right lower leg: No edema.     Left lower leg: No edema.  Skin:    General: Skin is warm and dry.  Neurological:     Mental Status: She is alert and oriented to person, place, and time. Mental status is at baseline.  Psychiatric:        Mood and Affect: Mood normal.     ED Results / Procedures / Treatments   Labs (all labs ordered are listed, but only abnormal results are displayed) Labs Reviewed - No data to display  EKG None  Radiology No results found.  Procedures Procedures  {Document cardiac monitor,  telemetry assessment procedure when appropriate:1}  Medications Ordered in ED Medications - No data to display  ED Course/ Medical Decision Making/ A&P Clinical Course as of 03/15/24 1748  Sun Mar 15, 2024  1038 Dr Fausto Hooker will admit to stepdown.  [RP]    Clinical Course User Index [RP] Ninetta Basket, MD   {   Click here for ABCD2, HEART and other calculatorsREFRESH Note before signing :1}                              Medical Decision Making Amount and/or Complexity of Data Reviewed Labs: ordered. Radiology: ordered.  Risk OTC drugs. Prescription drug management. Decision regarding hospitalization.   [name] is a 89 y.o. female with comorbidities that complicate the patient evaluation including  atrial fibrillation on Eliquis  and diltiazem , hyperlipidemia, diabetes, and dementia who presents to the emergency department with wheezing.    Initial Ddx:  ***   MDM/Course:  *** Upon re-evaluation ***  This patient presents to the ED for concern of complaints listed in HPI, this involves an extensive number of treatment options, and is a complaint that carries with it a high risk of complications and morbidity. Disposition including potential need for admission considered.   Dispo: {Disposition:28069}  Additional history obtained from {Additional History:28067} Records reviewed {Records Reviewed:28068} The following labs were independently interpreted: {labs interpreted:28064} and show {lab findings:28250} I independently reviewed the following imaging with scope of interpretation limited to determining acute life threatening conditions related to emergency care: {imaging interpreted:28065} and agree with the radiologist interpretation with the following exceptions: none I personally reviewed and interpreted cardiac monitoring: {cardiac monitoring:28251} I personally reviewed and interpreted the pt's EKG: see above for interpretation  I have reviewed the patients home  medications and made adjustments as needed Consults: {Consultants:28063} Social Determinants of health:  ***  Portions of this note were generated with Scientist, clinical (histocompatibility and immunogenetics). Dictation errors may occur despite best attempts at proofreading.    {Document your decision making why or why not admission, treatments were needed:1} Final Clinical Impression(s) / ED Diagnoses Final diagnoses:  None    Rx / DC Orders ED Discharge Orders     None

## 2024-03-15 NOTE — Progress Notes (Signed)
   03/15/24 1809  TOC Assessment  TOC screening is complete Yes  Once discharged, how will the patient get to their discharge location? Family/Friend - Partnered Transport  Barriers to Discharge Continued Medical Work up  Patient states their goals for this hospitalization and ongoing recovery are: Daughter-in-law visting patient . Daughter-in-law reports patient has dementia  Living arrangements for the past 2 months Single Family Home  Lives with: Adult Children  Need for Family Participation in Patient Care Y  Care giver support system in place? Y  Appearance: Appears stated age  Attitude/Demeanor/Rapport Engaged  Affect (typically observed) Pleasant  Orientation:  Oriented to Self   Transition of Care Department [facility]) has reviewed patient and no TOC needs have been identified at this time. We will continue to monitor patient advancement through interdisciplinary progression rounds. If new patient transition needs arise, please place a TOC consult.

## 2024-03-16 DIAGNOSIS — E871 Hypo-osmolality and hyponatremia: Secondary | ICD-10-CM | POA: Diagnosis not present

## 2024-03-16 LAB — BASIC METABOLIC PANEL WITH GFR
Anion gap: 12 (ref 5–15)
BUN: 19 mg/dL (ref 8–23)
CO2: 20 mmol/L — ABNORMAL LOW (ref 22–32)
Calcium: 9 mg/dL (ref 8.9–10.3)
Chloride: 94 mmol/L — ABNORMAL LOW (ref 98–111)
Creatinine, Ser: 0.68 mg/dL (ref 0.44–1.00)
GFR, Estimated: >60 mL/min (ref >60)
Glucose, Bld: 194 mg/dL — ABNORMAL HIGH (ref 70–99)
Potassium: 4.1 mmol/L (ref 3.5–5.1)
Sodium: 126 mmol/L — ABNORMAL LOW (ref 135–145)

## 2024-03-16 LAB — SODIUM
Sodium: 120 mmol/L — ABNORMAL LOW (ref 135–145)
Sodium: 126 mmol/L — ABNORMAL LOW (ref 135–145)
Sodium: 128 mmol/L — ABNORMAL LOW (ref 135–145)
Sodium: 126 mmol/L — ABNORMAL LOW (ref 135–145)
Sodium: 126 mmol/L — ABNORMAL LOW (ref 135–145)

## 2024-03-16 LAB — GLUCOSE, CAPILLARY
Glucose-Capillary: 191 mg/dL — ABNORMAL HIGH (ref 70–99)
Glucose-Capillary: 154 mg/dL — ABNORMAL HIGH (ref 70–99)
Glucose-Capillary: 107 mg/dL — ABNORMAL HIGH (ref 70–99)
Glucose-Capillary: 169 mg/dL — ABNORMAL HIGH (ref 70–99)

## 2024-03-16 NOTE — Consult Note (Signed)
 Reason for Consult: Acute on chronic hyponatremia Referring Physician:  Fausto Hooker, MD  LAURIEL ORRIS is an 89 y.o. female with a PMH significant for DM type 2, atrial fibrillation, dementia, h/o TIA, CAD, HLD, who was brought to [facility] ED on 03/15/24 via EMS due to pulmonary congestion and wheezing.  In the ED, temp 97.8, Bp 133/64, and found to be in A fib with RVR.  Labs were notable for Hgb 10.6, Na 117, Co2 18, Gluc 185.  She was admitted for further evaluation and management.  We were consulted due to hyponatremia.  The trend in serum sodium is seen below.  She was started on 3% saline yesterday at 30 cc/hr after transfer to ICU.  She became more confused yesterday, prior to transfer to ICU.  Her daughter is at the bedside and reports one episode of N/V prior to admission, however her main concern was her wheezing and congestion.  She has had issues with hyponatremia in the past during a UTI.  Mrs. Rusche is pleasantly demented and her daughter is the main source for the information in this HPI.  Currently she is awake and alert.  Trend in Serum Sodium: Sodium  Date/Time Value Ref Range Status  03/16/2024 06:41 AM 126 (L) 135 - 145 mmol/L Final  03/16/2024 02:08 AM 120 (L) 135 - 145 mmol/L Final  03/15/2024 06:38 PM 118 (LL) 135 - 145 mmol/L Final  03/15/2024 02:54 PM 118 (LL) 135 - 145 mmol/L Final  03/15/2024 11:05 AM 117 (LL) 135 - 145 mmol/L Final  03/15/2024 09:41 AM 117 (LL) 135 - 145 mmol/L Final  12/18/2023 04:48 PM 132 (L) 135 - 145 mmol/L Final  06/26/2023 02:46 PM 130 (L) 135 - 145 mmol/L Final  12/07/2022 04:16 AM 134 (L) 135 - 145 mmol/L Final  12/06/2022 04:17 AM 134 (L) 135 - 145 mmol/L Final  12/05/2022 05:59 PM 133 (L) 135 - 145 mmol/L Final  11/26/2022 03:35 AM 130 (L) 135 - 145 mmol/L Final  11/25/2022 04:07 AM 133 (L) 135 - 145 mmol/L Final  11/24/2022 03:36 AM 133 (L) 135 - 145 mmol/L Final  11/23/2022 06:44 AM 131 (L) 135 - 145 mmol/L Final  10/08/2022 06:32 AM 131 (L) 135 -  145 mmol/L Final  10/06/2022 04:56 AM 131 (L) 135 - 145 mmol/L Final  10/04/2022 01:20 AM 133 (L) 135 - 145 mmol/L Final  10/03/2022 07:34 PM 132 (L) 135 - 145 mmol/L Final  09/24/2022 04:41 AM 135 135 - 145 mmol/L Final  09/23/2022 04:15 AM 131 (L) 135 - 145 mmol/L Final  09/22/2022 04:32 AM 126 (L) 135 - 145 mmol/L Final  09/21/2022 04:31 AM 129 (L) 135 - 145 mmol/L Final  09/20/2022 05:23 PM 130 (L) 135 - 145 mmol/L Final  08/21/2022 08:05 AM 130 (L) 135 - 145 mmol/L Final  08/20/2022 06:46 AM 123 (L) 135 - 145 mmol/L Final  08/19/2022 11:01 AM 121 (L) 135 - 145 mmol/L Final  08/19/2022 04:37 AM 120 (L) 135 - 145 mmol/L Final  08/18/2022 06:25 PM 121 (L) 135 - 145 mmol/L Final  07/07/2022 04:33 AM 130 (L) 135 - 145 mmol/L Final  07/06/2022 01:53 PM 127 (L) 135 - 145 mmol/L Final  05/04/2022 04:27 AM 132 (L) 135 - 145 mmol/L Final  05/03/2022 04:47 AM 132 (L) 135 - 145 mmol/L Final  05/02/2022 03:39 AM 126 (L) 135 - 145 mmol/L Final  05/02/2022 01:29 AM 124 (L) 135 - 145 mmol/L Final  05/01/2022 08:20 PM 121 (L)  135 - 145 mmol/L Final  07/17/2021 01:24 PM 134 (L) 135 - 145 mmol/L Final  10/03/2020 08:30 AM 139 135 - 145 mmol/L Final  09/14/2020 08:28 AM 136 135 - 145 mmol/L Final  07/27/2020 08:26 AM 139 135 - 145 mmol/L Final    PMH:   Past Medical History:  Diagnosis Date   Atrial fibrillation (HCC)    Coronary atherosclerosis of native coronary artery    Nonobstructive   Dyslipidemia    PSVT (paroxysmal supraventricular tachycardia) (HCC)    TIA (transient ischemic attack)    Type 2 diabetes mellitus (HCC)    Diet controlled    PSH:   Past Surgical History:  Procedure Laterality Date   ABDOMINAL HYSTERECTOMY     CARPAL TUNNEL RELEASE     x's 2   CHOLECYSTECTOMY     REPLACEMENT TOTAL KNEE  2002 & 2012   SHOULDER SURGERY  2001    Allergies:  Allergies  Allergen Reactions   Macrobid [Nitrofurantoin] Diarrhea and Nausea And Vomiting   Toprol  Xl [Metoprolol   Succinate] Other (See Comments)    Hair loss    Medications:   Prior to Admission medications   Medication Sig Start Date End Date Taking? Authorizing Provider  acetaminophen  (TYLENOL ) 325 MG tablet Take 2 tablets (650 mg total) by mouth every 6 (six) hours as needed for mild pain, fever or headache (or Fever >/= 101). 10/08/22   Colin Dawley, MD  acidophilus (RISAQUAD) CAPS capsule Take 1 capsule by mouth daily.    [provider]  apixaban  (ELIQUIS ) 2.5 MG TABS tablet Take 1 tablet (2.5 mg total) by mouth 2 (two) times daily. 12/09/23   Gerard Knight, MD  atenolol  (TENORMIN ) 50 MG tablet TAKE 1 TABLET BY MOUTH TWICE A DAY 12/11/23   Gerard Knight, MD  ciprofloxacin  (CIPRO ) 500 MG tablet Take 1 tablet (500 mg total) by mouth every 12 (twelve) hours. 12/18/23   Bauer, Collin S, PA-C  D-MANNOSE PO Take 1 capsule by mouth with breakfast, with lunch, and with evening meal.    [provider]  diltiazem  (CARDIZEM  CD) 120 MG 24 hr capsule TAKE 1 CAPSULE BY MOUTH EVERY DAY 08/12/23   Gerard Knight, MD  metFORMIN (GLUCOPHAGE) 500 MG tablet Take 500-1,000 mg by mouth See admin instructions. 1000 mg in the morning, 500 mg at bedtime.    [provider]  Multiple Vitamin (MULTIVITAMIN) LIQD Take 15 mLs by mouth daily. 11/27/22   Bobbetta Burnet, MD    Discontinued Meds:   Medications Discontinued During This Encounter  Medication Reason   diltiazem  (CARDIZEM ) 125 mg in dextrose  5% 125 mL (1 mg/mL) infusion     Social History:  reports that she has never smoked. She has never used smokeless tobacco. She reports that she does not drink alcohol  and does not use drugs.  Family History:   Family History  Problem Relation Age of Onset   Coronary artery disease Mother     Pertinent items are noted in HPI.  Blood pressure (!) 149/82, pulse (!) 147, temperature 97.9 F (36.6 C), temperature source Oral, resp. rate 20, height 4\' 11"  (1.499 m), weight 62.8 kg,  SpO2 97%. General appearance: alert, cooperative, and no distress Head: Normocephalic, without obvious abnormality, atraumatic Resp: rhonchi bilaterally Cardio: irregularly irregular rhythm GI: soft, non-tender; bowel sounds normal; no masses,  no organomegaly Extremities: extremities normal, atraumatic, no cyanosis or edema  Labs: Basic Metabolic Panel: Recent Labs  Lab 03/15/24 0941 03/15/24 1105  03/15/24 1454 03/15/24 1838 03/16/24 0208 03/16/24 0641  NA 117* 117* 118* 118* 120* 126*  K 4.0  --   --   --   --  4.1  CL 86*  --   --   --   --  94*  CO2 18*  --   --   --   --  20*  GLUCOSE 185*  --   --   --   --  194*  BUN 15  --   --   --   --  19  CREATININE 0.60  --   --   --   --  0.68  ALBUMIN 3.6  --   --   --   --   --   CALCIUM 8.6*  --   --   --   --  9.0   Liver Function Tests: Recent Labs  Lab 03/15/24 0941  AST 22  ALT 14  ALKPHOS 45  BILITOT 0.6  PROT 7.1  ALBUMIN 3.6   No results for input(s): "LIPASE", "AMYLASE" in the last 168 hours. No results for input(s): "AMMONIA" in the last 168 hours. CBC: Recent Labs  Lab 03/15/24 0941  WBC 6.3  HGB 10.6*  HCT 32.0*  MCV 92.8  PLT 240   PT/INR: @labrcntip (inr:5) Cardiac Enzymes: No results for input(s): "CKTOTAL", "CKMB", "CKMBINDEX", "TROPONINI" in the last 168 hours. CBG: Recent Labs  Lab 03/15/24 1606 03/15/24 2148 03/16/24 0732  GLUCAP 289* 223* 191*    Iron Studies: No results for input(s): "IRON", "TIBC", "TRANSFERRIN", "FERRITIN" in the last 168 hours.  Xrays/Other Studies: DG Chest 2 View Result Date: 03/15/2024 CLINICAL DATA:  Congestion and shortness of breath.  Wheezing. EXAM: CHEST - 2 VIEW COMPARISON:  12/05/2022 FINDINGS: Lateral view degraded by artifact posteriorly. Moderate to marked right hemidiaphragm elevation. Leads and wires project over the chest on the frontal. Right rotator cuff repair. Midline trachea. Normal heart size. Atherosclerosis in the transverse aorta. No  pleural effusion or pneumothorax. Suspect mild left lower lobe scarring laterally. No lobar consolidation. Bilateral glenohumeral joint degenerative change. IMPRESSION: No acute cardiopulmonary disease. Chronic right hemidiaphragm elevation. Aortic Atherosclerosis (ICD10-I70.0). Electronically Signed   By: Lore Rode M.D.   On: 03/15/2024 09:29     Assessment/Plan:  Acute on chronic hyponatremia - in the setting of an URI/wheezing.  Sosm 258, Uosm 438, UNa 90 which is consistent with SIADH.  She is not on an SSRI.  Serum sodium level has improved with 3% saline to 126 from 117.  Will discontinue 3% saline as we don't want to correct too quickly.  Fluid restrict to 1200 mL/day.  Hold off on tolvaptan or Ure-Na for now and follow sodium levels every 4 hours.  Hold off on further malignancy workup for now and follow. Atrial fibrillation with RVR - on diltiazem  and atenolol .  Cardiology consulted and will follow. Wheezing/congestion - no history of COPD or tobacco use.  CXR unremarkable.  Respiratory panel negative. Improved with nebs.  Doxycycline per primary svc.   DM type 2 - per primary CAD - no chest pain. Acute metabolic encephalopathy - on underlying chronic dementia.  Per daughter she is back to her baseline mental status.  Disposition - DNR/DNI     Benjamin Brands 03/16/2024, 9:17 AM

## 2024-03-16 NOTE — TOC Progression Note (Signed)
 Transition of Care [facility]) - Progression Note    Patient Details  Name: [name] MRN: [id number] Date of Birth: Jan 03, 1927  [facility] CM/SW Contact  Grandville Lax, [place] Phone Number: 03/16/2024, 10:00 AM  Clinical Narrative:    CSW updated that PT is recommending HH PT for pt. CSW spoke with pts daughter [name] to complete assessment. Pts daughter assists with completing ADLs and transports pt. CSW inquired about HH being set up, pts daughter would like to follow up on day of D/C about this as pt may not need it once medically stable. Pt has a cane, walker and hospital bed in the home. TOC to follow.  Expected Discharge Plan: Home/Self Care Barriers to Discharge: Continued Medical Work up  Expected Discharge Plan and Services In-house Referral: Clinical Social Work Discharge Planning Services: CM Consult   Living arrangements for the past 2 months: Single Family Home                                       Social Determinants of Health (SDOH) Interventions SDOH Screenings   Food Insecurity: Patient Unable To Answer (03/15/2024)  Housing: Low Risk  (03/15/2024)  Transportation Needs: No Transportation Needs (03/15/2024)  Utilities: Not At Risk (03/15/2024)  Financial Resource Strain: Low Risk  (07/13/2022)  Social Connections: Unknown (03/15/2024)  Tobacco Use: Low Risk  (03/15/2024)  Health Literacy: Low Risk  (03/04/2020)   Received from [facility], [facility]    Readmission Risk Interventions    03/16/2024    9:57 AM 11/24/2022   10:52 AM  Readmission Risk Prevention Plan  Medication Screening Complete   Transportation Screening Complete Complete  Medication Review (RN Care Manager)  Complete  PCP or Specialist appointment within 3-5 days of discharge  Not Complete  HRI or Home Care Consult  Complete  SW Recovery Care/Counseling Consult  Complete  Palliative Care Screening  Not Applicable  Skilled Nursing Facility  Not Complete

## 2024-03-16 NOTE — Plan of Care (Signed)
  Problem: Acute Rehab PT Goals(only PT should resolve) Goal: Pt Will Go Supine/Side To Sit Flowsheets (Taken 03/16/2024 0919) Pt will go Supine/Side to Sit: Independently Goal: Patient Will Perform Sitting Balance Flowsheets (Taken 03/16/2024 0919) Patient will perform sitting balance: Independently Goal: Patient Will Transfer Sit To/From Stand Flowsheets (Taken 03/16/2024 0919) Patient will transfer sit to/from stand: with modified independence Goal: Pt Will Transfer Bed To Chair/Chair To Bed Flowsheets (Taken 03/16/2024 0919) Pt will Transfer Bed to Chair/Chair to Bed: with modified independence Goal: Pt Will Ambulate Flowsheets (Taken 03/16/2024 0919) Pt will Ambulate:  50 feet  with modified independence  with rolling walker  Astrid Lay, DPT [facility] [place] [phone] office

## 2024-03-16 NOTE — Plan of Care (Signed)

## 2024-03-16 NOTE — Evaluation (Signed)
 Physical Therapy Evaluation Patient Details Name: [name] MRN: [id number] DOB: Dec 13, 1926 Today's Date: 03/16/2024  History of Present Illness  a 89 y.o. female with a history of dementia, AFib, NIDT2DM, TIA who was brought to the ED this morning from home by EMS due to wheezing. Her daughter with whom she lives provides history in addition to EDP discussion and chart review. The patient has had several days of nasal congestion, poor oral intake, cough treated with robitussin. When she began having wheezing, decision was made to bring her in. She was also pointing at her chest at times, though has not reported chest pain. Has leg swelling which is chronic at baseline.  Clinical Impression   Pt tolerated today's Physical Therapy Evaluation, well limited to transfer to [facility] due to NA levels at this time. Pt's baseline is independent with transfers and ambulation using RW and assisted at baseline for ADLs by family. Pt has 24/7 support. Currently, pt demonstrating min<>mod assist for transfers due to muscle weakness and balance deficits present.   Based upon these deficits/impairments, patient will benefit from continued skilled physical therapy services during remainder of hospital stay and at the next recommended venue of care to address deficits and promote return to optimal function.                If plan is discharge home, recommend the following: A little help with walking and/or transfers;A little help with bathing/dressing/bathroom   Can travel by private vehicle        Equipment Recommendations None recommended by PT  Recommendations for Other Services       Functional Status Assessment Patient has had a recent decline in their functional status and demonstrates the ability to make significant improvements in function in a reasonable and predictable amount of time.     Precautions / Restrictions Precautions Precautions: Fall Restrictions Weight Bearing Restrictions Per  Provider Order: No      Mobility  Bed Mobility Overal bed mobility: Needs Assistance Bed Mobility: Supine to Sit, Sit to Supine     Supine to sit: Min assist Sit to supine: Mod assist   General bed mobility comments: slow, labored and given cues for bedrail assist with proper movement patterns. mod assit for return to supine at BLE. min assist x 2 for superior sliding EOB Patient Response: Cooperative  Transfers Overall transfer level: Needs assistance   Transfers: Sit to/from Stand, Bed to chair/wheelchair/BSC Sit to Stand: Min assist   Step pivot transfers: Min assist       General transfer comment: generalized weakness with unsteadiness during sit/stand step pivot to [facility], tactile cues and verbal cues given for proper UE management to handrail.    Ambulation/Gait               General Gait Details: Not indicated due to NA levels.  Stairs            Wheelchair Mobility     Tilt Bed Tilt Bed Patient Response: Cooperative  Modified Rankin (Stroke Patients Only)       Balance Overall balance assessment: Needs assistance Sitting-balance support: No upper extremity supported, Feet supported, Feet unsupported Sitting balance-Leahy Scale: Fair Sitting balance - Comments: sitting EOB and BSC   Standing balance support: Bilateral upper extremity supported, During functional activity Standing balance-Leahy Scale: Fair Standing balance comment: with 1 HHA from therapist  Pertinent Vitals/Pain Pain Assessment Pain Assessment: No/denies pain    Home Living Family/patient expects to be discharged to:: Private residence Living Arrangements: Children Available Help at Discharge: Family;Available 24 hours/day Type of Home: House Home Access: Ramped entrance       Home Layout: One level Home Equipment: Agricultural consultant (2 wheels);Cane - single point;Shower seat;Hospital bed;Transport chair Additional Comments: 24/7  from daughter/son and aide    Prior Function Prior Level of Function : Needs assist       Physical Assist : ADLs (physical)   ADLs (physical): Bathing;Dressing;Toileting;IADLs Mobility Comments: household ambulator with RW ADLs Comments: assisted by family     Extremity/Trunk Assessment   Upper Extremity Assessment Upper Extremity Assessment: Defer to OT evaluation    Lower Extremity Assessment Lower Extremity Assessment: Generalized weakness;RLE deficits/detail;LLE deficits/detail RLE Deficits / Details: 3+/5 MMT grossly RLE Sensation: WNL LLE Deficits / Details: 3+/5 MMT grossly LLE Sensation: WNL       Communication        Cognition Arousal: Alert Behavior During Therapy: WFL for tasks assessed/performed                             Following commands: Intact       Cueing Cueing Techniques: Verbal cues, Tactile cues     General Comments      Exercises     Assessment/Plan    PT Assessment Patient needs continued PT services  PT Problem List Decreased strength;Decreased activity tolerance;Decreased balance;Decreased mobility       PT Treatment Interventions DME instruction;Gait training;Functional mobility training;Therapeutic activities;Therapeutic exercise;Balance training;Neuromuscular re-education    PT Goals (Current goals can be found in the Care Plan section)  Acute Rehab PT Goals Patient Stated Goal: return home with HHPT PT Goal Formulation: With patient Time For Goal Achievement: 03/30/24 Potential to Achieve Goals: Good    Frequency Min 3X/week     Co-evaluation               AM-PAC PT "6 Clicks" Mobility  Outcome Measure Help needed turning from your back to your side while in a flat bed without using bedrails?: A Little Help needed moving from lying on your back to sitting on the side of a flat bed without using bedrails?: A Little Help needed moving to and from a bed to a chair (including a wheelchair)?: A  Little Help needed standing up from a chair using your arms (e.g., wheelchair or bedside chair)?: A Little Help needed to walk in hospital room?: A Lot Help needed climbing 3-5 steps with a railing? : A Lot 6 Click Score: 16    End of Session     Patient left: in bed;with call bell/phone within reach;with nursing/sitter in room;with family/visitor present Nurse Communication: Mobility status PT Visit Diagnosis: Unsteadiness on feet (R26.81);Muscle weakness (generalized) (M62.81)    Time: 1610-9604 PT Time Calculation (min) (ACUTE ONLY): 25 min   Charges:   PT Evaluation $PT Eval Low Complexity: 1 Low PT Treatments $Therapeutic Activity: 8-22 mins PT General Charges $$ ACUTE PT VISIT: 1 Visit         Astrid Lay, DPT [facility] [place] 336 [phone] office  Gatha Kaska 03/16/2024, 9:17 AM

## 2024-03-16 NOTE — Progress Notes (Signed)
 [facility] PROGRESS NOTE  [name] (DOB: [birth year]) ZOX:[id number] PCP: Orest Bio, MD  Brief Narrative: [name] is a 89 y.o. female with a history of dementia, AFib, NIDT2DM, TIA who presented to the ED on 03/15/2024 for wheezing and confusion from baseline. She was in rapid atrial fibrillation, started on diltiazem  infusion, given steroids and antibiotics initially, and admitted primarily for severe hyponatremia (Na 117). Work up consistent with SIADH. Hypertonic saline was given overnight into 4/28 with increase in sodium level to 126.   Subjective: Patient feels well, no complaints but wants to go home. Eating very little.   Objective: BP (!) 158/63   Pulse 96   Temp 97.9 F (36.6 C) (Oral)   Resp 20   Ht 4\' 11"  (1.499 m)   Wt 62.8 kg   SpO2 96%   BMI 27.96 kg/m   Gen: Elderly frail HOH female in no distress Pulm: Clear, nonlabored  CV: Irreg irreg, rate in 80's, no MRG, stable LE edema GI: Soft, NT, ND, +BS Neuro: Completely alert, but disoriented. No new focal deficits. Ext: Warm, no deformities Skin: No rashes, lesions or ulcers on visualized skin   Assessment & Plan: Severe symptomatic hyponatremia: Symptoms resolved, Na increased 117 > 126 on 3% saline. TSH, cortisol wnl/sufficient, urine sodium elevated. No known malignancy, pulmonary mass, thiazide or SSRI on board. - Given rise of 70mEq/L quickly, will stop 3% saline and not continue salt tabs.  - Continue fluid restriction (daughter has been pushing water to reduce UTI risk).  - Consideration yesterday given at admission to tolvaptan, deferred to nephrology.  - Will continue monitoring sodium very closely in ICU and avoid overcorrection.   Atrial fibrillation with RVR:  - Rate controlled on home diltiazem  and atenolol  - Continue DOAC.    Wheezing: Pt has no hx COPD, no hx tobacco use, no inhalers/previous treatments for COPD. Exam more consistent with upper airway irritation which is improved.  Covid, RSV, flu negative.  - Given high dose solumedrol in ED, not continuing. - Given history, continuing 5 days empiric doxycycline (no azithro with longer QT).    NIDT2DM:  - Sensitive SSI.  - Hold metformin   Nonobstructive CAD: Troponin reassuring, ECG with rapid rate still without diagnostic ischemic changes.  - Continue home beta blocker (atenolol , note allergy "hair loss" to metoprolol ) - Continue DOAC in place of aspirin - No longer on statin due to age.    DNR/DNI: Goals of care do include desire for hospitalization to treat the treatable at this point.    Acute metabolic encephalopathy on chronic dementia: Improved, back to baseline. - Daughter reports similar changes when pt has UTI though no bacteria on UA. Can restart suppressive abx after discharge as prescribed by PCP. - Delirium precautions.    Weakness: Walker use at baseline, lives with daughter for past 3 years. Has been weaker.  - Get PT/OT evaluations   Wynetta Heckle, MD [facility] [email or web address] 03/16/2024, 11:07 AM

## 2024-03-17 DIAGNOSIS — E871 Hypo-osmolality and hyponatremia: Secondary | ICD-10-CM | POA: Diagnosis not present

## 2024-03-17 LAB — RENAL FUNCTION PANEL
Albumin: 3.4 g/dL — ABNORMAL LOW (ref 3.5–5.0)
Anion gap: 11 (ref 5–15)
BUN: 25 mg/dL — ABNORMAL HIGH (ref 8–23)
CO2: 21 mmol/L — ABNORMAL LOW (ref 22–32)
Calcium: 9.1 mg/dL (ref 8.9–10.3)
Chloride: 96 mmol/L — ABNORMAL LOW (ref 98–111)
Creatinine, Ser: 0.67 mg/dL (ref 0.44–1.00)
GFR, Estimated: >60 mL/min (ref >60)
Glucose, Bld: 179 mg/dL — ABNORMAL HIGH (ref 70–99)
Phosphorus: 2.2 mg/dL — ABNORMAL LOW (ref 2.5–4.6)
Potassium: 3.8 mmol/L (ref 3.5–5.1)
Sodium: 128 mmol/L — ABNORMAL LOW (ref 135–145)

## 2024-03-17 LAB — GLUCOSE, CAPILLARY
Glucose-Capillary: 173 mg/dL — ABNORMAL HIGH (ref 70–99)
Glucose-Capillary: 122 mg/dL — ABNORMAL HIGH (ref 70–99)
Glucose-Capillary: 139 mg/dL — ABNORMAL HIGH (ref 70–99)
Glucose-Capillary: 129 mg/dL — ABNORMAL HIGH (ref 70–99)

## 2024-03-17 LAB — SODIUM: Sodium: 127 mmol/L — ABNORMAL LOW (ref 135–145)

## 2024-03-17 MED ORDER — FUROSEMIDE 10 MG/ML IJ SOLN
20.0000 mg | Freq: Once | INTRAMUSCULAR | Status: AC
  Administered 2024-03-17: 20 mg via INTRAVENOUS
  Filled 2024-03-17: qty 2

## 2024-03-17 NOTE — Plan of Care (Signed)

## 2024-03-17 NOTE — Progress Notes (Signed)
 [facility] Progress Note  Name: [name] MRN: [id number] DOB: Oct 11, 1927  Chief Complaint:  Wheezing and congestion   Subjective:  She had been started on 3% saline for hyponatremia earlier and this was discontinued on 03/16/24.  She continues in the ICU.  Strict ins/outs are not available - she had 7 unmeasured urine voids as well as 450 mL UOP over 4/28.  I spoke with her daughter at bedside.  The patient has been wheezing this AM since eating.  States that she normally eats and swallows pills alright.  History from the patient is limited but she is conversant.  She's watching [name].    Review of systems:  She denies overt shortness of breath but is wheezing  Limited secondary to dementia  Denies n/v  ------------------------- Background:  [name] is an 89 y.o. female with a PMH significant for DM type 2, atrial fibrillation, dementia, h/o TIA, CAD, HLD, who was brought to [facility] ED on 03/15/24 via EMS due to pulmonary congestion and wheezing.  In the ED, temp 97.8, Bp 133/64, and found to be in A fib with RVR.  Labs were notable for Hgb 10.6, Na 117, Co2 18, Gluc 185.  She was admitted for further evaluation and management.  We were consulted due to hyponatremia.  The trend in serum sodium is seen below.  She was started on 3% saline yesterday at 30 cc/hr after transfer to ICU.  She became more confused yesterday, prior to transfer to ICU.   Her daughter is at the bedside and reports one episode of N/V prior to admission, however her main concern was her wheezing and congestion.  She has had issues with hyponatremia in the past during a UTI.  [name] is pleasantly demented and her daughter is the main source for the information in this HPI.  Currently she is awake and alert.   Intake/Output Summary (Last 24 hours) at 03/17/2024 0917 Last data filed at 03/16/2024 1015 Gross per 24 hour  Intake 64.68 ml  Output 150 ml  Net -85.32 ml    Vitals:  Vitals:    03/17/24 0200 03/17/24 0413 03/17/24 0747 03/17/24 0800  BP: (!) 171/94   138/72  Pulse: 98  (!) 105 90  Resp: (!) 23  (!) 24   Temp:  (!) 96.6 F (35.9 C) 98.3 F (36.8 C)   TempSrc:  Axillary Axillary   SpO2: 95%  98%   Weight:      Height:         Physical Exam:  General elderly female in bed in no acute distress HEENT normocephalic atraumatic extraocular movements intact sclera anicteric Neck supple trachea midline Lungs audible wheezing; unlabored at rest on room air  Heart S1S2 no rub Abdomen soft nontender nondistended Extremities trace to 1+ edema lower extremities Psych normal mood and affect Neuro conversant but confused.  Oriented to person. States year is "1980's" and we are at "[facility] in [place]"   Medications reviewed   Labs:     Latest Ref Rng & Units 03/17/2024    6:16 AM 03/17/2024    2:00 AM 03/16/2024    9:48 PM  BMP  Glucose 70 - 99 mg/dL 409     BUN 8 - 23 mg/dL 25     Creatinine 8.11 - 1.00 mg/dL 9.14     Sodium 782 - 956 mmol/L 128  127  126   Potassium 3.5 - 5.1 mmol/L 3.8  Chloride 98 - 111 mmol/L 96     CO2 22 - 32 mmol/L 21     Calcium 8.9 - 10.3 mg/dL 9.1        Assessment/Plan:    Acute on chronic hyponatremia - in the setting of an URI/wheezing.  Sosm 258, Uosm 438, UNa 90 which is consistent with SIADH.  She is not on an SSRI.  Serum sodium level has improved with 3% saline to 126 from 117.  3% saline was discontinued on 4/28 to avoid correcting too quickly.   Fluid restrict to 1200 mL/day.   Lasix 20 mg IV once to optimize respiratory status  See that repeat electrolytes are ordered for AM.  Hyponatremia has stabilized.  Will discontinue every 4 hour sodium checks  Hold off on further malignancy workup for now and follow. Atrial fibrillation with RVR - on diltiazem  and atenolol .  Per primary team  Wheezing/congestion - no history of COPD or tobacco use.  CXR unremarkable.  Respiratory panel negative. Improved with nebs.  Abx  per primary team discretion.  She has slight edema on exam - will give lasix 20 mg IV once today as above   DM type 2 - per primary team  CAD - no chest pain.  Per primary team  Acute metabolic encephalopathy - on underlying chronic dementia.  Per daughter she is back to her baseline mental status.  Proteinuria - incidental finding noted on chart review.  Triaging further work-up at this time. On dilt which may offer slight benefit   Disposition - per primary team   [name] Aver, MD 03/17/2024 9:39 AM

## 2024-03-17 NOTE — Progress Notes (Signed)
 [facility] PROGRESS NOTE  [name] (DOB: 05-08-1927) AOZ:[id number] PCP: Orest Bio, MD  Brief Narrative: [name] is a 89 y.o. female with a history of dementia, AFib, NIDT2DM, TIA who presented to the ED on 03/15/2024 for wheezing and confusion from baseline. She was in rapid atrial fibrillation, started on diltiazem  infusion, given steroids and antibiotics initially, and admitted primarily for severe hyponatremia (Na 117). Work up consistent with SIADH. Hypertonic saline was given overnight into 4/28 with increase in sodium level to 126. Na gradually improving, 128 on 4/29. Nephrology continues to follow.  Subjective: No new complaints, daughter at bedside. She is back to her mental baseline but having waxing/waning night-time-predominant delirium/pulling lines.   Objective: BP 138/72   Pulse 90   Temp 98.3 F (36.8 C) (Axillary)   Resp (!) 24   Ht 4\' 11"  (1.499 m)   Wt 62.8 kg   SpO2 98%   BMI 27.96 kg/m   Gen: Elderly frail pleasantly confused female in no distress Pulm: Clear, nonlabored with upper airway transmission.   CV: RRR, stable LE edema GI: Soft, NT, ND, +BS Neuro: Alert and disoriented. No new focal deficits. Ext: Warm, no deformities Skin: No rashes, lesions or ulcers on visualized skin Gen: Elderly frail   Assessment & Plan: Severe symptomatic hyponatremia: Symptoms resolved, Na increased 117 > 126 on 3% saline. TSH, cortisol wnl/sufficient, urine sodium elevated. No known malignancy, pulmonary mass, thiazide or SSRI on board. s/p 3% NaCl, 117 > 126, now gradual rise to 128.  - Give lasix today per nephrology, continue monitoring, can monitor less frequently. Transfer to med-surg.   - Continue fluid restriction (daughter had been pushing water to reduce UTI risk).    Atrial fibrillation with RVR:  - Rate controlled on home diltiazem  and atenolol  - Continue DOAC.    Wheezing: Pt has no hx COPD, no hx tobacco use, no inhalers/previous treatments  for COPD. Exam more consistent with upper airway irritation which is improved. Covid, RSV, flu negative.  - Given high dose solumedrol in ED, not continued. - Given history, continuing 5 days empiric doxycycline (no azithro with longer QT).    NIDT2DM:  - Sensitive SSI.  - Hold metformin   Nonobstructive CAD: Troponin reassuring, ECG with rapid rate still without diagnostic ischemic changes.  - Continue home beta blocker (atenolol , note allergy "hair loss" to metoprolol ) - Continue DOAC in place of aspirin - No longer on statin due to age.    DNR/DNI: Goals of care do include desire for hospitalization to treat the treatable at this point.    Acute metabolic encephalopathy on chronic dementia: Improved, back to baseline. - Daughter reports similar changes when pt has UTI though no bacteria on UA. Can restart suppressive abx after discharge as prescribed by PCP. - Delirium precautions.    Weakness: Walker use at baseline, lives with daughter for past 3 years. Has been weaker.  - Appreciate PT/OT  Wynetta Heckle, MD [facility] [email or web address] 03/17/2024, 10:52 AM

## 2024-03-18 DIAGNOSIS — I5032 Chronic diastolic (congestive) heart failure: Secondary | ICD-10-CM

## 2024-03-18 DIAGNOSIS — I4891 Unspecified atrial fibrillation: Secondary | ICD-10-CM

## 2024-03-18 DIAGNOSIS — G9341 Metabolic encephalopathy: Secondary | ICD-10-CM

## 2024-03-18 LAB — RENAL FUNCTION PANEL
Albumin: 3 g/dL — ABNORMAL LOW (ref 3.5–5.0)
Anion gap: 10 (ref 5–15)
BUN: 32 mg/dL — ABNORMAL HIGH (ref 8–23)
CO2: 21 mmol/L — ABNORMAL LOW (ref 22–32)
Calcium: 8.9 mg/dL (ref 8.9–10.3)
Chloride: 99 mmol/L (ref 98–111)
Creatinine, Ser: 0.95 mg/dL (ref 0.44–1.00)
GFR, Estimated: 55 mL/min — ABNORMAL LOW (ref >60)
Glucose, Bld: 138 mg/dL — ABNORMAL HIGH (ref 70–99)
Phosphorus: 3.1 mg/dL (ref 2.5–4.6)
Potassium: 3.9 mmol/L (ref 3.5–5.1)
Sodium: 130 mmol/L — ABNORMAL LOW (ref 135–145)

## 2024-03-18 LAB — GLUCOSE, CAPILLARY
Glucose-Capillary: 148 mg/dL — ABNORMAL HIGH (ref 70–99)
Glucose-Capillary: 156 mg/dL — ABNORMAL HIGH (ref 70–99)

## 2024-03-18 MED ORDER — METFORMIN HCL 500 MG PO TABS: 500.0000 mg | ORAL_TABLET | ORAL | 2 refills | Status: DC

## 2024-03-18 MED ORDER — DOXYCYCLINE HYCLATE 100 MG PO TABS: 100.0000 mg | ORAL_TABLET | Freq: Two times a day (BID) | ORAL | 0 refills | Status: AC

## 2024-03-18 MED ORDER — FUROSEMIDE 10 MG/ML IJ SOLN
20.0000 mg | Freq: Once | INTRAMUSCULAR | Status: AC
  Administered 2024-03-18: 20 mg via INTRAVENOUS
  Filled 2024-03-18: qty 2

## 2024-03-18 MED ORDER — BISACODYL 10 MG RE SUPP
10.0000 mg | Freq: Once | RECTAL | Status: AC
  Administered 2024-03-18: 10 mg via RECTAL
  Filled 2024-03-18: qty 1

## 2024-03-18 NOTE — TOC Transition Note (Signed)
 Transition of Care [facility]) - Discharge Note   Patient Details  Name: [name] MRN: [id number] Date of Birth: 12-01-1926  [facility] CM/SW Contact:  Grandville Lax, LCSWA Phone Number: 03/18/2024, 11:11 AM  Clinical Narrative:    CSW spoke with pts daughter to review if they would like [facility] PT set up for pt at D/C. Pts daughter states they feel comfortable with no HH being set up at this time. CSW explained that if they get home and change their mind to reach out to pts PCP and they will be able to assist with getting [facility] services. TOC signing off.   Final next level of care: Home/Self Care Barriers to Discharge: Barriers Resolved   Patient Goals and CMS Choice Patient states their goals for this hospitalization and ongoing recovery are:: return home CMS Medicare.gov Compare Post Acute Care list provided to:: Patient Represenative (must comment) Choice offered to / list presented to : Adult Children      Discharge Placement                       Discharge Plan and Services Additional resources added to the After Visit Summary for   In-house Referral: Clinical Social Work Discharge Planning Services: CM Consult                      HH Arranged: Patient Refused HH          Social Drivers of Health (SDOH) Interventions SDOH Screenings   Food Insecurity: Patient Unable To Answer (03/15/2024)  Housing: Low Risk  (03/15/2024)  Transportation Needs: No Transportation Needs (03/15/2024)  Utilities: Not At Risk (03/15/2024)  Financial Resource Strain: Low Risk  (07/13/2022)  Social Connections: Unknown (03/15/2024)  Tobacco Use: Low Risk  (03/15/2024)  Health Literacy: Low Risk  (03/04/2020)   Received from [facility]     Readmission Risk Interventions    03/16/2024    9:57 AM 11/24/2022   10:52 AM  Readmission Risk Prevention Plan  Medication Screening Complete   Transportation Screening Complete Complete  Medication Review (RN  Care Manager)  Complete  PCP or Specialist appointment within 3-5 days of discharge  Not Complete  HRI or Home Care Consult  Complete  SW Recovery Care/Counseling Consult  Complete  Palliative Care Screening  Not Applicable  Skilled Nursing Facility  Not Complete

## 2024-03-18 NOTE — Discharge Instructions (Addendum)
 1)Avoid ibuprofen/Advil/Aleve/Motrin/Goody Powders/Naproxen/BC powders/Meloxicam/Diclofenac/Indomethacin and other Nonsteroidal anti-inflammatory medications as these will make you more likely to bleed and can cause stomach ulcers, can also cause Kidney problems.   2)Watch for bleeding while on Blood Thinners--watch for blood in your stool which can make your stool black, maroon, mahogany or red---, blood in your urine which can make your urine pink or red, nosebleeds , also watch for possible bruising -You are taking Apixaban /Eliquis --- which is a blood thinner--- be careful to avoid injury or falls  3)Repeat BMP Blood Test in 5 to 7 days   4)Avoid Excessive Free (Plain) water intake to avoid diluting your blood and lowering your sodium levels

## 2024-03-18 NOTE — Progress Notes (Signed)
 [facility] Progress Note  Name: [name] MRN: [id number] DOB: 19-Apr-1927  Chief Complaint:  Wheezing and congestion   Subjective:  I gave her lasix 20 mg IV once yesterday for wheezing and concern for slight overload.  Interim improvement in Na. Strict ins/outs are not available.  She had 5 unmeasured urine voids over 4/29.  Spoke with primary team and he is discharging her today.  Her daughter states that she sees her PCP every 4 months and they will be able to get an appointment within a week of this hospital discharge - she was already scheduled for mid-May labs and can move this up.  Her daughter states they reviewed the year and location earlier this morning but that her mother is doing good to have remembered it.    Review of systems:   She denies overt shortness of breath; no wheezing per her daughter.  Her mother slept through the night she thinks Limited secondary to dementia  Denies n/v  ------------------------- Background:  [name] is an 89 y.o. female with a PMH significant for DM type 2, atrial fibrillation, dementia, h/o TIA, CAD, HLD, who was brought to [facility] ED on 03/15/24 via EMS due to pulmonary congestion and wheezing.  In the ED, temp 97.8, Bp 133/64, and found to be in A fib with RVR.  Labs were notable for Hgb 10.6, Na 117, Co2 18, Gluc 185.  She was admitted for further evaluation and management.  We were consulted due to hyponatremia.  The trend in serum sodium is seen below.  She was started on 3% saline yesterday at 30 cc/hr after transfer to ICU.  She became more confused yesterday, prior to transfer to ICU.   Her daughter is at the bedside and reports one episode of N/V prior to admission, however her main concern was her wheezing and congestion.  She has had issues with hyponatremia in the past during a UTI.  [name] is pleasantly demented and her daughter is the main source for the information in this HPI.  Currently she is awake and  alert.   Intake/Output Summary (Last 24 hours) at 03/18/2024 0921 Last data filed at 03/18/2024 4098 Gross per 24 hour  Intake 603 ml  Output --  Net 603 ml    Vitals:  Vitals:   03/18/24 0500 03/18/24 0802 03/18/24 0830 03/18/24 0831  BP:   (!) 134/58 (!) 134/58  Pulse:    91  Resp:    19  Temp: (!) 97.5 F (36.4 C) (!) 97.5 F (36.4 C)    TempSrc: Oral Oral    SpO2:    99%  Weight:      Height:         Physical Exam:   General elderly female in bed in no acute distress HEENT normocephalic atraumatic extraocular movements intact sclera anicteric Neck supple trachea midline Lungs clear to auscultation; unlabored at rest on room air  Heart S1S2 no rub Abdomen soft nontender nondistended Extremities trace edema lower extremities Psych normal mood and affect Neuro conversant.  Oriented to person. States year is "2025" and we are at [facility]" after initially starting to say "[place]".  (Better today)   Medications reviewed   Labs:     Latest Ref Rng & Units 03/18/2024    4:50 AM 03/17/2024    6:16 AM 03/17/2024    2:00 AM  BMP  Glucose 70 - 99 mg/dL 119  147    BUN 8 -  23 mg/dL 32  25    Creatinine 1.61 - 1.00 mg/dL 0.96  0.45    Sodium 409 - 145 mmol/L 130  128  127   Potassium 3.5 - 5.1 mmol/L 3.9  3.8    Chloride 98 - 111 mmol/L 99  96    CO2 22 - 32 mmol/L 21  21    Calcium 8.9 - 10.3 mg/dL 8.9  9.1       Assessment/Plan:    Acute on chronic hyponatremia - in the setting of an URI/wheezing.  Sosm 258, Uosm 438, UNa 90 which is consistent with SIADH.  She is not on an SSRI.  Serum sodium level has improved with 3% saline to 126 from 117.  3% saline was discontinued on 4/28 to avoid correcting too quickly.   Fluid restrict to 1200 mL/day.  Please continue fluid restriction at home Lasix 20 mg IV once to optimize respiratory status.  Will hold off on scheduling lasix at home.  This could be given once or twice a week as needed PRN swelling or  wheezing.  Please reassess on PCP follow-up for need  Hold off on further malignancy workup for now and follow. Atrial fibrillation with RVR - on diltiazem  and atenolol .  Per primary team  Wheezing/congestion - no history of COPD or tobacco use.  CXR unremarkable.  Respiratory panel negative. Improved with nebs.  Abx per primary team discretion. Lasix once today DM type 2 - per primary team  CAD - no chest pain.  Per primary team  Acute metabolic encephalopathy - on underlying chronic dementia.  Per daughter she is back to her baseline mental status.  Proteinuria - incidental finding noted on chart review.  Triaging further work-up at this time. On dilt which may offer slight benefit   Disposition - per primary team. Stable from a strictly renal standpoint.  Please follow-up with PCP within a week.  Will hold off on nephrology follow-up at this time (her daughter agrees).  Will also hold off on scheduling lasix at home.  Lasix could be given once or twice a week as needed PRN swelling or wheezing.  Please reassess on PCP follow-up for need  Nan Aver, MD 03/18/2024 9:38 AM

## 2024-03-18 NOTE — Progress Notes (Signed)
 Patient alert and oriented x2-3. No complaints of pain, shortness of breath, chest pain, dizziness, nausea or vomiting. Patient up out of bed with stand-by assist to commode and chair, gait steady. Patient tolerated PO meds and diet well. Appetite fair. Went over discharge summary, follow up information and medication education with patient and patient daughter. All questions answered and both expressed full understanding with daughter doing teach back. IV removed with no signs of complications. Patient discharged for home with all belongings via car.

## 2024-03-18 NOTE — Care Management Important Message (Signed)
 Important Message  Patient Details  Name: [name] MRN: [id number] Date of Birth: November 14, 1927   Important Message Given:  Yes - Medicare IM, reviewed letter with daughter [name] at [phone].     Erwin Nishiyama L Sherine Cortese 03/18/2024, 10:38 AM

## 2024-03-18 NOTE — Plan of Care (Signed)
  Problem: Education: Goal: Knowledge of General Education information will improve Description: Including pain rating scale, medication(s)/side effects and non-pharmacologic comfort measures Outcome: Progressing   Problem: Health Behavior/Discharge Planning: Goal: Ability to manage health-related needs will improve Outcome: Progressing   Problem: Clinical Measurements: Goal: Ability to maintain clinical measurements within normal limits will improve Outcome: Progressing Goal: Will remain free from infection Outcome: Progressing Goal: Diagnostic test results will improve Outcome: Progressing Goal: Respiratory complications will improve Outcome: Progressing Goal: Cardiovascular complication will be avoided Outcome: Progressing   Problem: Activity: Goal: Risk for activity intolerance will decrease Outcome: Progressing   Problem: Nutrition: Goal: Adequate nutrition will be maintained Outcome: Progressing   Problem: Coping: Goal: Level of anxiety will decrease Outcome: Progressing   Problem: Elimination: Goal: Will not experience complications related to bowel motility Outcome: Progressing Goal: Will not experience complications related to urinary retention Outcome: Progressing   Problem: Pain Managment: Goal: General experience of comfort will improve and/or be controlled Outcome: Progressing   Problem: Safety: Goal: Ability to remain free from injury will improve Outcome: Progressing   Problem: Skin Integrity: Goal: Risk for impaired skin integrity will decrease Outcome: Progressing   Problem: Education: Goal: Ability to describe self-care measures that may prevent or decrease complications (Diabetes Survival Skills Education) will improve Outcome: Progressing Goal: Individualized Educational Video(s) Outcome: Progressing   Problem: Coping: Goal: Ability to adjust to condition or change in health will improve Outcome: Progressing   Problem: Fluid  Volume: Goal: Ability to maintain a balanced intake and output will improve Outcome: Progressing   Problem: Health Behavior/Discharge Planning: Goal: Ability to identify and utilize available resources and services will improve Outcome: Progressing Goal: Ability to manage health-related needs will improve Outcome: Progressing   Problem: Nutritional: Goal: Maintenance of adequate nutrition will improve Outcome: Progressing Goal: Progress toward achieving an optimal weight will improve Outcome: Progressing   Problem: Skin Integrity: Goal: Risk for impaired skin integrity will decrease Outcome: Progressing   Problem: Tissue Perfusion: Goal: Adequacy of tissue perfusion will improve Outcome: Progressing

## 2024-03-18 NOTE — Discharge Summary (Signed)
 [name], is a 89 y.o. female  DOB [birth year]  MRN [id number].  Admission date:  03/15/2024  Admitting Physician  Wynetta Heckle, MD  Discharge Date:  03/18/2024   Primary MD  Orest Bio, MD  Recommendations for primary care physician for things to follow:  1)Avoid ibuprofen/Advil/Aleve/Motrin/Goody Powders/Naproxen/BC powders/Meloxicam/Diclofenac/Indomethacin and other Nonsteroidal anti-inflammatory medications as these will make you more likely to bleed and can cause stomach ulcers, can also cause Kidney problems.   2)Watch for bleeding while on Blood Thinners--watch for blood in your stool which can make your stool black, maroon, mahogany or red---, blood in your urine which can make your urine pink or red, nosebleeds , also watch for possible bruising -You are taking Apixaban /Eliquis --- which is a blood thinner--- be careful to avoid injury or falls  3)Repeat BMP Blood Test in 5 to 7 days   4)Avoid Excessive Free (Plain) water intake to avoid diluting your blood and lowering your sodium levels  Admission Diagnosis  Hyponatremia [E87.1]   Discharge Diagnosis  Hyponatremia [E87.1]    Active Problems:   Acute metabolic encephalopathy   Chronic diastolic CHF (congestive heart failure) (HCC)   Type 2 diabetes mellitus (HCC)   Coronary atherosclerosis of native coronary artery   Anemia of chronic disease   Current use of long term anticoagulation   Generalized weakness   Atrial fibrillation with RVR (HCC)   Hyponatremia      Past Medical History:  Diagnosis Date   Atrial fibrillation (HCC)    Coronary atherosclerosis of native coronary artery    Nonobstructive   Dyslipidemia    PSVT (paroxysmal supraventricular tachycardia) (HCC)    TIA (transient ischemic attack)    Type 2 diabetes mellitus (HCC)    Diet controlled    Past Surgical History:  Procedure Laterality Date   ABDOMINAL  HYSTERECTOMY     CARPAL TUNNEL RELEASE     x's 2   CHOLECYSTECTOMY     REPLACEMENT TOTAL KNEE  2002 & 2012   SHOULDER SURGERY  2001     HPI  from the history and physical done on the day of admission:   HPI: [name] is a 89 y.o. female with a history of dementia, AFib, NIDT2DM, TIA who was brought to the ED this morning from home by EMS due to wheezing. Her daughter with whom she lives provides history in addition to EDP discussion and chart review. The patient has had several days of nasal congestion, poor oral intake, cough treated with robitussin. When she began having wheezing, decision was made to bring her in. She was also pointing at her chest at times, though has not reported chest pain. Has leg swelling which is chronic at baseline.    In the ED at time of admission, pt is resting, awakens and answers yes/no questions, denies current pain or trouble breathing.    In the ED she was in rapid atrial fibrillation with severe hyponatremia (Na 117) noted on labs. Troponin was normal. She had expiratory wheezing for  which duonebs and solumedrol were given. Normal saline and diltiazem  infusion were also started and admission requested.    Review of Systems: unable to review all systems due to the inability of the patient to answer questions.     Hospital Course:   Brief Narrative: [name] is a 89 y.o. female with a history of dementia, AFib, NIDT2DM, TIA who presented to the ED on 03/15/2024 for wheezing and confusion from baseline. She was in rapid atrial fibrillation, started on diltiazem  infusion, given steroids and antibiotics initially, and admitted primarily for severe hyponatremia (Na 117). Work up consistent with SIADH. Hypertonic saline was given overnight into 4/28 with increase in sodium level to 126. Na gradually improving, 128 on 4/29. Nephrology continues to follow.  Assessment and Plan: Severe symptomatic hyponatremia: Symptoms resolved, Na increased 117 > 126 on 3%  saline. TSH, cortisol wnl/sufficient, urine sodium elevated. No known malignancy, pulmonary mass, thiazide or SSRI on board. s/p 3% NaCl, 117 > 126, now gradual rise to 130 -Neurology consult appreciated, received Lasix  - Continue fluid restriction (daughter had been pushing water to reduce UTI risk).   -Family reminded to avoid excessive free water   Atrial fibrillation with RVR:  - Rate controlled on home diltiazem  and atenolol  - Continue apixaban  for stroke prophylaxis   Wheezing: Pt has no hx COPD, no hx tobacco use, no inhalers/previous treatments for COPD. Exam more consistent with upper airway irritation which is improved. Covid, RSV, flu negative.  - Improved on steroids and doxycycline    NIDT2DM: A1c 6.7 reflecting good diabetic control PTA - Resume PTA metformin    Nonobstructive CAD: Troponin reassuring, ECG with rapid rate still without diagnostic ischemic changes.  - Continue home beta blocker (atenolol , note allergy "hair loss" to metoprolol ) -Patient is on apixaban  for A-fib show no need for aspirin - No longer on statin due to age.    DNR/DNI: Goals of care do include desire for hospitalization to treat the treatable at this point.    Acute metabolic encephalopathy on chronic dementia: Improved, back to baseline. - Daughter reports similar changes when pt has UTI though no bacteria on UA. Can restart suppressive abx after discharge as prescribed by PCP.    Weakness: Walker use at baseline, lives with daughter for past 3 years. Has been weaker.  - Appreciate PT/OT eval   Discharge Condition: stable  Follow UP   Follow-up Information     Sasser, Ky Phillips, MD. Schedule an appointment as soon as possible for a visit.   Specialty: Family Medicine Why: Repeat BMP Blood Test in 5 to 7 days Contact information: [address] [place] [address] [phone]                  Consults obtained - Nephrology  Diet and Activity recommendation:  As  advised  Discharge Instructions    Discharge Instructions     Call MD for:  difficulty breathing, headache or visual disturbances   Complete by: As directed    Call MD for:  persistant dizziness or light-headedness   Complete by: As directed    Call MD for:  persistant nausea and vomiting   Complete by: As directed    Call MD for:  severe uncontrolled pain   Complete by: As directed    Call MD for:  temperature >100.4   Complete by: As directed    Diet general   Complete by: As directed    Discharge instructions   Complete by: As directed  1)Avoid ibuprofen/Advil/Aleve/Motrin/Goody Powders/Naproxen/BC powders/Meloxicam/Diclofenac/Indomethacin and other Nonsteroidal anti-inflammatory medications as these will make you more likely to bleed and can cause stomach ulcers, can also cause Kidney problems.   2)Watch for bleeding while on Blood Thinners--watch for blood in your stool which can make your stool black, maroon, mahogany or red---, blood in your urine which can make your urine pink or red, nosebleeds , also watch for possible bruising -You are taking Apixaban /Eliquis --- which is a blood thinner--- be careful to avoid injury or falls  3)Repeat BMP Blood Test in 5 to 7 days   4)Avoid Excessive Free (Plain) water intake to avoid diluting your blood and lowering your sodium levels   Increase activity slowly   Complete by: As directed          Discharge Medications     Allergies as of 03/18/2024       Reactions   Macrobid [nitrofurantoin] Diarrhea, Nausea And Vomiting   Toprol  Xl [metoprolol  Succinate] Other (See Comments)   Hair loss        Medication List     STOP taking these medications    sulfamethoxazole -trimethoprim  400-80 MG tablet Commonly known as: BACTRIM        TAKE these medications    acetaminophen  325 MG tablet Commonly known as: TYLENOL  Take 2 tablets (650 mg total) by mouth every 6 (six) hours as needed for mild pain, fever or headache (or  Fever >/= 101).   apixaban  2.5 MG Tabs tablet Commonly known as: Eliquis  Take 1 tablet (2.5 mg total) by mouth 2 (two) times daily.   atenolol  50 MG tablet Commonly known as: TENORMIN  TAKE 1 TABLET BY MOUTH TWICE A DAY   AZO CRANBERRY GUMMIES PO Take 2 tablets by mouth daily.   D-MANNOSE PO Take 1 capsule by mouth with breakfast, with lunch, and with evening meal.   diltiazem  120 MG 24 hr capsule Commonly known as: CARDIZEM  CD TAKE 1 CAPSULE BY MOUTH EVERY DAY   doxycycline  100 MG tablet Commonly known as: VIBRA -TABS Take 1 tablet (100 mg total) by mouth 2 (two) times daily for 5 days.   metFORMIN  500 MG tablet Commonly known as: GLUCOPHAGE  Take 1-2 tablets (500-1,000 mg total) by mouth See admin instructions. 1000 mg in the morning, 500 mg at bedtime. What changed: Another medication with the same name was removed. Continue taking this medication, and follow the directions you see here.   multivitamin Liqd Take 15 mLs by mouth daily.        Major procedures and Radiology Reports - PLEASE review detailed and final reports for all details, in brief -   DG Chest 2 View Result Date: 03/15/2024 CLINICAL DATA:  Congestion and shortness of breath.  Wheezing. EXAM: CHEST - 2 VIEW COMPARISON:  12/05/2022 FINDINGS: Lateral view degraded by artifact posteriorly. Moderate to marked right hemidiaphragm elevation. Leads and wires project over the chest on the frontal. Right rotator cuff repair. Midline trachea. Normal heart size. Atherosclerosis in the transverse aorta. No pleural effusion or pneumothorax. Suspect mild left lower lobe scarring laterally. No lobar consolidation. Bilateral glenohumeral joint degenerative change. IMPRESSION: No acute cardiopulmonary disease. Chronic right hemidiaphragm elevation. Aortic Atherosclerosis (ICD10-I70.0). Electronically Signed   By: Lore Rode M.D.   On: 03/15/2024 09:29    Micro Results   Recent Results (from the past 240 hours)  Resp  panel by RT-PCR (RSV, Flu A&B, Covid) Anterior Nasal Swab     Status: None   Collection Time: 03/15/24  8:45 AM  Specimen: Anterior Nasal Swab  Result Value Ref Range Status   SARS Coronavirus 2 by RT PCR NEGATIVE NEGATIVE Final    Comment: (NOTE) SARS-CoV-2 target nucleic acids are NOT DETECTED.  The SARS-CoV-2 RNA is generally detectable in upper respiratory specimens during the acute phase of infection. The lowest concentration of SARS-CoV-2 viral copies this assay can detect is 138 copies/mL. A negative result does not preclude SARS-Cov-2 infection and should not be used as the sole basis for treatment or other patient management decisions. A negative result may occur with  improper specimen collection/handling, submission of specimen other than nasopharyngeal swab, presence of viral mutation(s) within the areas targeted by this assay, and inadequate number of viral copies(<138 copies/mL). A negative result must be combined with clinical observations, patient history, and epidemiological information. The expected result is Negative.  Fact Sheet for Patients:  BloggerCourse.com  Fact Sheet for Healthcare Providers:  SeriousBroker.it  This test is no t yet approved or cleared by the [place]  FDA and  has been authorized for detection and/or diagnosis of SARS-CoV-2 by FDA under an Emergency Use Authorization (EUA). This EUA will remain  in effect (meaning this test can be used) for the duration of the COVID-19 declaration under Section 564(b)(1) of the Act, 21 U.S.C.section 360bbb-3(b)(1), unless the authorization is terminated  or revoked sooner.       Influenza A by PCR NEGATIVE NEGATIVE Final   Influenza B by PCR NEGATIVE NEGATIVE Final    Comment: (NOTE) The Xpert Xpress SARS-CoV-2/FLU/RSV plus assay is intended as an aid in the diagnosis of influenza from Nasopharyngeal swab specimens and should not be used as a  sole basis for treatment. Nasal washings and aspirates are unacceptable for Xpert Xpress SARS-CoV-2/FLU/RSV testing.  Fact Sheet for Patients: BloggerCourse.com  Fact Sheet for Healthcare Providers: SeriousBroker.it  This test is not yet approved or cleared by the [place]  FDA and has been authorized for detection and/or diagnosis of SARS-CoV-2 by FDA under an Emergency Use Authorization (EUA). This EUA will remain in effect (meaning this test can be used) for the duration of the COVID-19 declaration under Section 564(b)(1) of the Act, 21 U.S.C. section 360bbb-3(b)(1), unless the authorization is terminated or revoked.     Resp Syncytial Virus by PCR NEGATIVE NEGATIVE Final    Comment: (NOTE) Fact Sheet for Patients: BloggerCourse.com  Fact Sheet for Healthcare Providers: SeriousBroker.it  This test is not yet approved or cleared by the [place]  FDA and has been authorized for detection and/or diagnosis of SARS-CoV-2 by FDA under an Emergency Use Authorization (EUA). This EUA will remain in effect (meaning this test can be used) for the duration of the COVID-19 declaration under Section 564(b)(1) of the Act, 21 U.S.C. section 360bbb-3(b)(1), unless the authorization is terminated or revoked.  Performed at [facility], [address] [place], [place] [address]   MRSA Next Gen by PCR, Nasal     Status: None   Collection Time: 03/15/24 11:33 AM   Specimen: Nasal Mucosa; Nasal Swab  Result Value Ref Range Status   MRSA by PCR Next Gen NOT DETECTED NOT DETECTED Final    Comment: (NOTE) The GeneXpert MRSA Assay (FDA approved for NASAL specimens only), is one component of a comprehensive MRSA colonization surveillance program. It is not intended to diagnose MRSA infection nor to guide or monitor treatment for MRSA infections. Test performance is not FDA approved in  patients less than 24 years old. Performed at [facility], [address], [place],  [place] [address]     Today   Subjective    [name] today has no new complaints  No fever  Or chills   No Nausea, Vomiting or Diarrhea   Patient has been seen and examined prior to discharge   Objective   Blood pressure (!) 134/58, pulse 91, temperature (!) 97.5 F (36.4 C), temperature source Oral, resp. rate 19, height 4\' 11"  (1.499 m), weight 62.8 kg, SpO2 99%.   Intake/Output Summary (Last 24 hours) at 03/18/2024 1358 Last data filed at 03/18/2024 1227 Gross per 24 hour  Intake 843 ml  Output --  Net 843 ml    Exam Gen:- Awake Alert, no acute distress  HEENT:- [place].AT, No sclera icterus Neck-Supple Neck,No JVD,.  Lungs-  CTAB , good air movement bilaterally CV- S1, S2 normal, regular Abd-  +ve B.Sounds, Abd Soft, No tenderness,    Extremity/Skin:- No  edema,   good pulses Psych-affect is appropriate, oriented x3 Neuro-no new focal deficits, no tremors    Data Review   CBC w Diff:  Lab Results  Component Value Date   WBC 6.3 03/15/2024   HGB 10.6 (L) 03/15/2024   HCT 32.0 (L) 03/15/2024   PLT 240 03/15/2024   LYMPHOPCT 13 12/18/2023   BANDSPCT 11 09/22/2022   MONOPCT 8 12/18/2023   EOSPCT 1 12/18/2023   BASOPCT 0 12/18/2023    CMP:  Lab Results  Component Value Date   NA 130 (L) 03/18/2024   K 3.9 03/18/2024   CL 99 03/18/2024   CO2 21 (L) 03/18/2024   BUN 32 (H) 03/18/2024   CREATININE 0.95 03/18/2024   PROT 7.1 03/15/2024   ALBUMIN 3.0 (L) 03/18/2024   BILITOT 0.6 03/15/2024   ALKPHOS 45 03/15/2024   AST 22 03/15/2024   ALT 14 03/15/2024  .  Total Discharge time is about 33 minutes  Colin Dawley M.D on 03/18/2024 at 1:58 PM  Go to [email or web address] -  for contact info  [facility] - Office  [phone]

## 2024-03-27 DIAGNOSIS — E119 Type 2 diabetes mellitus without complications: Secondary | ICD-10-CM | POA: Diagnosis not present

## 2024-03-27 DIAGNOSIS — E7849 Other hyperlipidemia: Secondary | ICD-10-CM | POA: Diagnosis not present

## 2024-03-27 DIAGNOSIS — E782 Mixed hyperlipidemia: Secondary | ICD-10-CM | POA: Diagnosis not present

## 2024-03-27 DIAGNOSIS — I10 Essential (primary) hypertension: Secondary | ICD-10-CM | POA: Diagnosis not present

## 2024-03-27 DIAGNOSIS — Z6827 Body mass index (BMI) 27.0-27.9, adult: Secondary | ICD-10-CM | POA: Diagnosis not present

## 2024-03-27 DIAGNOSIS — E871 Hypo-osmolality and hyponatremia: Secondary | ICD-10-CM | POA: Diagnosis not present

## 2024-03-27 DIAGNOSIS — G9341 Metabolic encephalopathy: Secondary | ICD-10-CM | POA: Diagnosis not present

## 2024-03-27 DIAGNOSIS — D649 Anemia, unspecified: Secondary | ICD-10-CM | POA: Diagnosis not present

## 2024-04-06 DIAGNOSIS — Z6828 Body mass index (BMI) 28.0-28.9, adult: Secondary | ICD-10-CM | POA: Diagnosis not present

## 2024-04-06 DIAGNOSIS — E871 Hypo-osmolality and hyponatremia: Secondary | ICD-10-CM | POA: Diagnosis not present

## 2024-04-06 DIAGNOSIS — I10 Essential (primary) hypertension: Secondary | ICD-10-CM | POA: Diagnosis not present

## 2024-04-06 DIAGNOSIS — N1831 Chronic kidney disease, stage 3a: Secondary | ICD-10-CM | POA: Diagnosis not present

## 2024-04-06 DIAGNOSIS — E782 Mixed hyperlipidemia: Secondary | ICD-10-CM | POA: Diagnosis not present

## 2024-04-17 DIAGNOSIS — G9341 Metabolic encephalopathy: Secondary | ICD-10-CM | POA: Diagnosis not present

## 2024-04-17 DIAGNOSIS — E871 Hypo-osmolality and hyponatremia: Secondary | ICD-10-CM | POA: Diagnosis not present

## 2024-04-17 DIAGNOSIS — D649 Anemia, unspecified: Secondary | ICD-10-CM | POA: Diagnosis not present

## 2024-04-17 DIAGNOSIS — I10 Essential (primary) hypertension: Secondary | ICD-10-CM | POA: Diagnosis not present

## 2024-05-01 DIAGNOSIS — R000 Tachycardia, unspecified: Secondary | ICD-10-CM | POA: Diagnosis not present

## 2024-05-01 DIAGNOSIS — M25562 Pain in left knee: Secondary | ICD-10-CM | POA: Diagnosis not present

## 2024-05-01 DIAGNOSIS — W19XXXA Unspecified fall, initial encounter: Secondary | ICD-10-CM | POA: Diagnosis not present

## 2024-05-29 ENCOUNTER — Ambulatory Visit: Attending: Cardiology | Admitting: Cardiology

## 2024-05-29 ENCOUNTER — Encounter: Payer: Self-pay | Admitting: Cardiology

## 2024-05-29 VITALS — BP 136/80 | HR 89 | Ht 59.0 in | Wt 137.2 lb

## 2024-05-29 DIAGNOSIS — I4821 Permanent atrial fibrillation: Secondary | ICD-10-CM | POA: Diagnosis not present

## 2024-05-29 DIAGNOSIS — E782 Mixed hyperlipidemia: Secondary | ICD-10-CM | POA: Insufficient documentation

## 2024-05-29 NOTE — Patient Instructions (Signed)
 Medication Instructions:  Your physician recommends that you continue on your current medications as directed. Please refer to the Current Medication list given to you today.   Labwork: None today  Testing/Procedures: None today  Follow-Up: 6 months  Any Other Special Instructions Will Be Listed Below (If Applicable).  If you need a refill on your cardiac medications before your next appointment, please call your pharmacy.

## 2024-05-29 NOTE — Progress Notes (Signed)
    Cardiology Office Note  Date: 05/29/2024   ID: ITZAYANNA KASTER, DOB [birth year], MRN [id number]  History of Present Illness: [name] is a 89 y.o. female last seen in July 2024.  She is here today with her daughter for a follow-up visit. She was hospitalized in April with encephalopathy and hyponatremia consistent with SIADH.  During that time and rapid atrial fibrillation as evidenced by interval ECG.  Since then getting back to baseline.  She does not report any palpitations.  Her daughter lives with her in the same home to provide assistance.  We went over her medications.  No changes from a cardiac perspective.  She has had no spontaneous bleeding problems on Eliquis , did have a bruise on her left thigh after fall without major injury in the bathroom.  She has not had frequent or recurring falls.  Uses a walker at home.  I reviewed her interval lab work.  Physical Exam: VS:  BP 136/80 (BP Location: Left Arm, Cuff Size: Normal)   Pulse 89   Ht 4' 11 (1.499 m)   Wt 137 lb 3.2 oz (62.2 kg)   SpO2 100%   BMI 27.71 kg/m , BMI Body mass index is 27.71 kg/m.  Wt Readings from Last 3 Encounters:  05/29/24 137 lb 3.2 oz (62.2 kg)  03/15/24 138 lb 7.2 oz (62.8 kg)  06/26/23 133 lb 6.4 oz (60.5 kg)    General: Patient appears comfortable at rest. HEENT: Conjunctiva and lids normal. Neck: Supple, no elevated JVP or carotid bruits. Lungs: Clear to auscultation, nonlabored breathing at rest. Cardiac: Irregularly irregular, soft systolic murmur without gallop. Extremities: No pitting edema.  ECG:  An ECG dated 03/15/2024 was personally reviewed today and demonstrated:  Atrial fibrillation with RVR, nonspecific ST changes.  Labwork: 03/15/2024: ALT 14; AST 22; B Natriuretic Peptide 233.0; Hemoglobin 10.6; Platelets 240; TSH 1.807 03/18/2024: BUN 32; Creatinine, Ser 0.95; Potassium 3.9; Sodium 130   Other Studies Reviewed Today:  No interval cardiac testing for review  today.  Assessment and Plan:  1.  Permanent atrial fibrillation with CHA2DS2-VASc score of 7.  Heart rate controlled today on current regimen including atenolol  50 mg twice daily and Cardizem  CD 120 mg daily.  Continue Eliquis  2.5 mg twice daily for stroke prophylaxis.  I reviewed her interval lab work.   2.  History of mixed hyperlipidemia, previously on Zocor  however discontinued by PCP.  Her last LDL was 70.  Disposition:  Follow up 6 months.  Signed, Jayson JUDITHANN Sierras, M.D., F.A.C.C. [facility] at [facility]

## 2024-06-13 ENCOUNTER — Other Ambulatory Visit: Payer: Self-pay | Admitting: Cardiology

## 2024-06-15 NOTE — Telephone Encounter (Signed)
 Prescription refill request for Eliquis  received. Indication: AF Last office visit: 05/29/24  GORMAN Sierras MD Scr: 0.95 on 03/18/24  Epic Age: 89 Weight: 62.2kg  Continue Eliquis  2.5mg  twice daily due to age and previous weight < 60kg.  Refill approved.

## 2024-07-14 ENCOUNTER — Other Ambulatory Visit: Payer: Self-pay | Admitting: Cardiology

## 2024-07-17 DIAGNOSIS — I10 Essential (primary) hypertension: Secondary | ICD-10-CM | POA: Diagnosis not present

## 2024-07-17 DIAGNOSIS — F3341 Major depressive disorder, recurrent, in partial remission: Secondary | ICD-10-CM | POA: Diagnosis not present

## 2024-07-17 DIAGNOSIS — E1165 Type 2 diabetes mellitus with hyperglycemia: Secondary | ICD-10-CM | POA: Diagnosis not present

## 2024-07-17 DIAGNOSIS — E782 Mixed hyperlipidemia: Secondary | ICD-10-CM | POA: Diagnosis not present

## 2024-07-22 DIAGNOSIS — I4891 Unspecified atrial fibrillation: Secondary | ICD-10-CM | POA: Diagnosis not present

## 2024-07-22 DIAGNOSIS — I10 Essential (primary) hypertension: Secondary | ICD-10-CM | POA: Diagnosis not present

## 2024-07-22 DIAGNOSIS — N390 Urinary tract infection, site not specified: Secondary | ICD-10-CM | POA: Diagnosis not present

## 2024-07-22 DIAGNOSIS — E1121 Type 2 diabetes mellitus with diabetic nephropathy: Secondary | ICD-10-CM | POA: Diagnosis not present

## 2024-07-22 DIAGNOSIS — E782 Mixed hyperlipidemia: Secondary | ICD-10-CM | POA: Diagnosis not present

## 2024-07-22 DIAGNOSIS — M1991 Primary osteoarthritis, unspecified site: Secondary | ICD-10-CM | POA: Diagnosis not present

## 2024-07-22 DIAGNOSIS — H6123 Impacted cerumen, bilateral: Secondary | ICD-10-CM | POA: Diagnosis not present

## 2024-07-28 DIAGNOSIS — E871 Hypo-osmolality and hyponatremia: Secondary | ICD-10-CM | POA: Diagnosis not present

## 2024-07-28 DIAGNOSIS — E119 Type 2 diabetes mellitus without complications: Secondary | ICD-10-CM | POA: Diagnosis not present

## 2024-07-28 DIAGNOSIS — D649 Anemia, unspecified: Secondary | ICD-10-CM | POA: Diagnosis not present

## 2024-07-28 DIAGNOSIS — E1165 Type 2 diabetes mellitus with hyperglycemia: Secondary | ICD-10-CM | POA: Diagnosis not present

## 2024-07-28 DIAGNOSIS — N1831 Chronic kidney disease, stage 3a: Secondary | ICD-10-CM | POA: Diagnosis not present

## 2024-07-28 DIAGNOSIS — E1122 Type 2 diabetes mellitus with diabetic chronic kidney disease: Secondary | ICD-10-CM | POA: Diagnosis not present

## 2024-07-28 DIAGNOSIS — E875 Hyperkalemia: Secondary | ICD-10-CM | POA: Diagnosis not present

## 2024-07-28 DIAGNOSIS — E7849 Other hyperlipidemia: Secondary | ICD-10-CM | POA: Diagnosis not present

## 2024-07-28 DIAGNOSIS — E1121 Type 2 diabetes mellitus with diabetic nephropathy: Secondary | ICD-10-CM | POA: Diagnosis not present

## 2024-08-03 DIAGNOSIS — E871 Hypo-osmolality and hyponatremia: Secondary | ICD-10-CM | POA: Diagnosis not present

## 2024-08-03 DIAGNOSIS — E1122 Type 2 diabetes mellitus with diabetic chronic kidney disease: Secondary | ICD-10-CM | POA: Diagnosis not present

## 2024-08-03 DIAGNOSIS — N1831 Chronic kidney disease, stage 3a: Secondary | ICD-10-CM | POA: Diagnosis not present

## 2024-08-03 DIAGNOSIS — Z6828 Body mass index (BMI) 28.0-28.9, adult: Secondary | ICD-10-CM | POA: Diagnosis not present

## 2024-08-03 DIAGNOSIS — Z23 Encounter for immunization: Secondary | ICD-10-CM | POA: Diagnosis not present

## 2024-08-03 DIAGNOSIS — I10 Essential (primary) hypertension: Secondary | ICD-10-CM | POA: Diagnosis not present

## 2024-08-07 ENCOUNTER — Other Ambulatory Visit: Payer: Self-pay | Admitting: Cardiology

## 2024-08-17 DIAGNOSIS — F3341 Major depressive disorder, recurrent, in partial remission: Secondary | ICD-10-CM | POA: Diagnosis not present

## 2024-08-17 DIAGNOSIS — E782 Mixed hyperlipidemia: Secondary | ICD-10-CM | POA: Diagnosis not present

## 2024-08-17 DIAGNOSIS — E1165 Type 2 diabetes mellitus with hyperglycemia: Secondary | ICD-10-CM | POA: Diagnosis not present

## 2024-08-17 DIAGNOSIS — I10 Essential (primary) hypertension: Secondary | ICD-10-CM | POA: Diagnosis not present

## 2024-08-18 DIAGNOSIS — E782 Mixed hyperlipidemia: Secondary | ICD-10-CM | POA: Diagnosis not present

## 2024-08-18 DIAGNOSIS — I10 Essential (primary) hypertension: Secondary | ICD-10-CM | POA: Diagnosis not present

## 2024-08-18 DIAGNOSIS — E1121 Type 2 diabetes mellitus with diabetic nephropathy: Secondary | ICD-10-CM | POA: Diagnosis not present

## 2024-08-18 DIAGNOSIS — N390 Urinary tract infection, site not specified: Secondary | ICD-10-CM | POA: Diagnosis not present

## 2024-08-18 DIAGNOSIS — M1991 Primary osteoarthritis, unspecified site: Secondary | ICD-10-CM | POA: Diagnosis not present

## 2024-08-18 DIAGNOSIS — I4891 Unspecified atrial fibrillation: Secondary | ICD-10-CM | POA: Diagnosis not present

## 2024-09-02 DIAGNOSIS — J069 Acute upper respiratory infection, unspecified: Secondary | ICD-10-CM | POA: Diagnosis not present

## 2024-09-15 DIAGNOSIS — E782 Mixed hyperlipidemia: Secondary | ICD-10-CM | POA: Diagnosis not present

## 2024-09-15 DIAGNOSIS — E1121 Type 2 diabetes mellitus with diabetic nephropathy: Secondary | ICD-10-CM | POA: Diagnosis not present

## 2024-09-15 DIAGNOSIS — J069 Acute upper respiratory infection, unspecified: Secondary | ICD-10-CM | POA: Diagnosis not present

## 2024-09-15 DIAGNOSIS — M1991 Primary osteoarthritis, unspecified site: Secondary | ICD-10-CM | POA: Diagnosis not present

## 2024-09-15 DIAGNOSIS — I10 Essential (primary) hypertension: Secondary | ICD-10-CM | POA: Diagnosis not present

## 2024-09-15 DIAGNOSIS — I4891 Unspecified atrial fibrillation: Secondary | ICD-10-CM | POA: Diagnosis not present

## 2024-09-15 DIAGNOSIS — N390 Urinary tract infection, site not specified: Secondary | ICD-10-CM | POA: Diagnosis not present

## 2024-09-18 DIAGNOSIS — I10 Essential (primary) hypertension: Secondary | ICD-10-CM | POA: Diagnosis not present

## 2024-09-18 DIAGNOSIS — E1121 Type 2 diabetes mellitus with diabetic nephropathy: Secondary | ICD-10-CM | POA: Diagnosis not present

## 2024-09-18 DIAGNOSIS — F3341 Major depressive disorder, recurrent, in partial remission: Secondary | ICD-10-CM | POA: Diagnosis not present

## 2024-09-18 DIAGNOSIS — E782 Mixed hyperlipidemia: Secondary | ICD-10-CM | POA: Diagnosis not present

## 2024-10-13 DIAGNOSIS — M1991 Primary osteoarthritis, unspecified site: Secondary | ICD-10-CM | POA: Diagnosis not present

## 2024-10-13 DIAGNOSIS — N390 Urinary tract infection, site not specified: Secondary | ICD-10-CM | POA: Diagnosis not present

## 2024-10-13 DIAGNOSIS — E782 Mixed hyperlipidemia: Secondary | ICD-10-CM | POA: Diagnosis not present

## 2024-10-13 DIAGNOSIS — I4891 Unspecified atrial fibrillation: Secondary | ICD-10-CM | POA: Diagnosis not present

## 2024-10-13 DIAGNOSIS — E1121 Type 2 diabetes mellitus with diabetic nephropathy: Secondary | ICD-10-CM | POA: Diagnosis not present

## 2024-10-13 DIAGNOSIS — I10 Essential (primary) hypertension: Secondary | ICD-10-CM | POA: Diagnosis not present

## 2024-10-18 DIAGNOSIS — N1831 Chronic kidney disease, stage 3a: Secondary | ICD-10-CM | POA: Diagnosis not present

## 2024-10-18 DIAGNOSIS — E782 Mixed hyperlipidemia: Secondary | ICD-10-CM | POA: Diagnosis not present

## 2024-10-18 DIAGNOSIS — I10 Essential (primary) hypertension: Secondary | ICD-10-CM | POA: Diagnosis not present

## 2024-10-18 DIAGNOSIS — E1165 Type 2 diabetes mellitus with hyperglycemia: Secondary | ICD-10-CM | POA: Diagnosis not present

## 2024-11-30 ENCOUNTER — Encounter (HOSPITAL_COMMUNITY): Payer: Self-pay

## 2024-11-30 ENCOUNTER — Other Ambulatory Visit: Payer: Self-pay

## 2024-11-30 ENCOUNTER — Emergency Department (HOSPITAL_COMMUNITY)

## 2024-11-30 ENCOUNTER — Inpatient Hospital Stay (HOSPITAL_COMMUNITY)
Admission: EM | Admit: 2024-11-30 | Discharge: 2024-12-03 | DRG: 871 | Disposition: A | Discharge status: Skilled Nursing Facility | Attending: Family Medicine | Admitting: Family Medicine

## 2024-11-30 DIAGNOSIS — I10 Essential (primary) hypertension: Secondary | ICD-10-CM

## 2024-11-30 DIAGNOSIS — R531 Weakness: Secondary | ICD-10-CM | POA: Diagnosis not present

## 2024-11-30 DIAGNOSIS — N136 Pyonephrosis: Secondary | ICD-10-CM | POA: Diagnosis present

## 2024-11-30 DIAGNOSIS — Z8616 Personal history of COVID-19: Secondary | ICD-10-CM | POA: Diagnosis not present

## 2024-11-30 DIAGNOSIS — E1122 Type 2 diabetes mellitus with diabetic chronic kidney disease: Secondary | ICD-10-CM | POA: Diagnosis present

## 2024-11-30 DIAGNOSIS — G9341 Metabolic encephalopathy: Secondary | ICD-10-CM | POA: Diagnosis present

## 2024-11-30 DIAGNOSIS — Z66 Do not resuscitate: Secondary | ICD-10-CM | POA: Diagnosis present

## 2024-11-30 DIAGNOSIS — Z96659 Presence of unspecified artificial knee joint: Secondary | ICD-10-CM | POA: Diagnosis present

## 2024-11-30 DIAGNOSIS — Z7901 Long term (current) use of anticoagulants: Secondary | ICD-10-CM | POA: Diagnosis not present

## 2024-11-30 DIAGNOSIS — D638 Anemia in other chronic diseases classified elsewhere: Secondary | ICD-10-CM | POA: Diagnosis present

## 2024-11-30 DIAGNOSIS — E860 Dehydration: Secondary | ICD-10-CM | POA: Diagnosis present

## 2024-11-30 DIAGNOSIS — R4182 Altered mental status, unspecified: Secondary | ICD-10-CM | POA: Diagnosis present

## 2024-11-30 DIAGNOSIS — E1165 Type 2 diabetes mellitus with hyperglycemia: Secondary | ICD-10-CM | POA: Diagnosis present

## 2024-11-30 DIAGNOSIS — Z8673 Personal history of transient ischemic attack (TIA), and cerebral infarction without residual deficits: Secondary | ICD-10-CM | POA: Diagnosis not present

## 2024-11-30 DIAGNOSIS — Z9049 Acquired absence of other specified parts of digestive tract: Secondary | ICD-10-CM

## 2024-11-30 DIAGNOSIS — E871 Hypo-osmolality and hyponatremia: Secondary | ICD-10-CM | POA: Diagnosis present

## 2024-11-30 DIAGNOSIS — N390 Urinary tract infection, site not specified: Secondary | ICD-10-CM

## 2024-11-30 DIAGNOSIS — I4891 Unspecified atrial fibrillation: Secondary | ICD-10-CM | POA: Diagnosis present

## 2024-11-30 DIAGNOSIS — I480 Paroxysmal atrial fibrillation: Secondary | ICD-10-CM | POA: Diagnosis present

## 2024-11-30 DIAGNOSIS — Z7984 Long term (current) use of oral hypoglycemic drugs: Secondary | ICD-10-CM | POA: Diagnosis not present

## 2024-11-30 DIAGNOSIS — A4150 Gram-negative sepsis, unspecified: Secondary | ICD-10-CM | POA: Diagnosis present

## 2024-11-30 DIAGNOSIS — N320 Bladder-neck obstruction: Secondary | ICD-10-CM | POA: Diagnosis not present

## 2024-11-30 DIAGNOSIS — I5032 Chronic diastolic (congestive) heart failure: Secondary | ICD-10-CM | POA: Diagnosis not present

## 2024-11-30 DIAGNOSIS — I130 Hypertensive heart and chronic kidney disease with heart failure and stage 1 through stage 4 chronic kidney disease, or unspecified chronic kidney disease: Secondary | ICD-10-CM | POA: Diagnosis present

## 2024-11-30 DIAGNOSIS — Z8249 Family history of ischemic heart disease and other diseases of the circulatory system: Secondary | ICD-10-CM | POA: Diagnosis not present

## 2024-11-30 DIAGNOSIS — E785 Hyperlipidemia, unspecified: Secondary | ICD-10-CM | POA: Diagnosis present

## 2024-11-30 DIAGNOSIS — Z79899 Other long term (current) drug therapy: Secondary | ICD-10-CM | POA: Diagnosis not present

## 2024-11-30 DIAGNOSIS — A419 Sepsis, unspecified organism: Principal | ICD-10-CM | POA: Diagnosis present

## 2024-11-30 DIAGNOSIS — E872 Acidosis, unspecified: Secondary | ICD-10-CM | POA: Diagnosis present

## 2024-11-30 DIAGNOSIS — N1831 Chronic kidney disease, stage 3a: Secondary | ICD-10-CM | POA: Diagnosis present

## 2024-11-30 DIAGNOSIS — F03A0 Unspecified dementia, mild, without behavioral disturbance, psychotic disturbance, mood disturbance, and anxiety: Secondary | ICD-10-CM | POA: Diagnosis present

## 2024-11-30 DIAGNOSIS — I2510 Atherosclerotic heart disease of native coronary artery without angina pectoris: Secondary | ICD-10-CM | POA: Diagnosis present

## 2024-11-30 LAB — CBC WITH DIFFERENTIAL/PLATELET
Abs Immature Granulocytes: 0.02 K/uL (ref 0.00–0.07)
Basophils Absolute: 0 K/uL (ref 0.0–0.1)
Basophils Relative: 1 %
Eosinophils Absolute: 0 K/uL (ref 0.0–0.5)
Eosinophils Relative: 0 %
HCT: 33.7 % — ABNORMAL LOW (ref 36.0–46.0)
Hemoglobin: 10.7 g/dL — ABNORMAL LOW (ref 12.0–15.0)
Immature Granulocytes: 0 %
Lymphocytes Relative: 8 %
Lymphs Abs: 0.6 K/uL — ABNORMAL LOW (ref 0.7–4.0)
MCH: 31 pg (ref 26.0–34.0)
MCHC: 31.8 g/dL (ref 30.0–36.0)
MCV: 97.7 fL (ref 80.0–100.0)
Monocytes Absolute: 0.8 K/uL (ref 0.1–1.0)
Monocytes Relative: 12 %
Neutro Abs: 5.7 K/uL (ref 1.7–7.7)
Neutrophils Relative %: 79 %
Platelets: 391 K/uL (ref 150–400)
RBC: 3.45 MIL/uL — ABNORMAL LOW (ref 3.87–5.11)
RDW: 13.3 % (ref 11.5–15.5)
WBC: 7.2 K/uL (ref 4.0–10.5)
nRBC: 0 % (ref 0.0–0.2)

## 2024-11-30 LAB — URINALYSIS, W/ REFLEX TO CULTURE (INFECTION SUSPECTED)
Bilirubin Urine: NEGATIVE
Glucose, UA: NEGATIVE mg/dL
Hgb urine dipstick: NEGATIVE
Ketones, ur: NEGATIVE mg/dL
Nitrite: NEGATIVE
Protein, ur: 100 mg/dL — AB
Specific Gravity, Urine: 1.012 (ref 1.005–1.030)
WBC, UA: >50 WBC/hpf (ref 0–5)
pH: 5 (ref 5.0–8.0)

## 2024-11-30 LAB — BASIC METABOLIC PANEL WITH GFR
Anion gap: 19 — ABNORMAL HIGH (ref 5–15)
BUN: 26 mg/dL — ABNORMAL HIGH (ref 8–23)
CO2: 16 mmol/L — ABNORMAL LOW (ref 22–32)
Calcium: 9.1 mg/dL (ref 8.9–10.3)
Chloride: 93 mmol/L — ABNORMAL LOW (ref 98–111)
Creatinine, Ser: 1.11 mg/dL — ABNORMAL HIGH (ref 0.44–1.00)
GFR, Estimated: 46 mL/min — ABNORMAL LOW
Glucose, Bld: 187 mg/dL — ABNORMAL HIGH (ref 70–99)
Potassium: 4.3 mmol/L (ref 3.5–5.1)
Sodium: 128 mmol/L — ABNORMAL LOW (ref 135–145)

## 2024-11-30 LAB — LACTIC ACID, PLASMA
Lactic Acid, Venous: 2.6 mmol/L (ref 0.5–1.9)
Lactic Acid, Venous: 1.9 mmol/L (ref 0.5–1.9)

## 2024-11-30 MED ORDER — CHLORHEXIDINE GLUCONATE CLOTH 2 % EX PADS
6.0000 | MEDICATED_PAD | Freq: Every day | CUTANEOUS | Status: DC
  Administered 2024-12-01 – 2024-12-03 (×4): 6 via TOPICAL

## 2024-11-30 MED ORDER — ONDANSETRON HCL 4 MG PO TABS: 4.0000 mg | ORAL_TABLET | Freq: Four times a day (QID) | ORAL | Status: DC | PRN

## 2024-11-30 MED ORDER — DILTIAZEM HCL-DEXTROSE 125-5 MG/125ML-% IV SOLN (PREMIX)
5.0000 mg/h | INTRAVENOUS | Status: DC
  Administered 2024-11-30: 5 mg/h via INTRAVENOUS
  Administered 2024-12-01: 15 mg/h via INTRAVENOUS
  Filled 2024-11-30 (×2): qty 125

## 2024-11-30 MED ORDER — ONDANSETRON HCL 4 MG/2ML IJ SOLN: 4.0000 mg | Freq: Four times a day (QID) | INTRAMUSCULAR | Status: DC | PRN

## 2024-11-30 MED ORDER — ACETAMINOPHEN 325 MG PO TABS
650.0000 mg | ORAL_TABLET | Freq: Four times a day (QID) | ORAL | Status: DC | PRN
  Administered 2024-12-01 – 2024-12-02 (×3): 650 mg via ORAL
  Filled 2024-11-30 (×3): qty 2

## 2024-11-30 MED ORDER — VANCOMYCIN HCL 1500 MG/300ML IV SOLN
1500.0000 mg | Freq: Once | INTRAVENOUS | Status: AC
  Administered 2024-11-30: 1500 mg via INTRAVENOUS
  Filled 2024-11-30: qty 300

## 2024-11-30 MED ORDER — LACTATED RINGERS IV SOLN: INTRAVENOUS | Status: AC

## 2024-11-30 MED ORDER — SODIUM CHLORIDE 0.9 % IV BOLUS
1000.0000 mL | Freq: Once | INTRAVENOUS | Status: AC
  Administered 2024-11-30: 1000 mL via INTRAVENOUS

## 2024-11-30 MED ORDER — ACETAMINOPHEN 650 MG RE SUPP: 650.0000 mg | Freq: Four times a day (QID) | RECTAL | Status: DC | PRN

## 2024-11-30 MED ORDER — DILTIAZEM LOAD VIA INFUSION
10.0000 mg | Freq: Once | INTRAVENOUS | Status: AC
  Administered 2024-11-30: 10 mg via INTRAVENOUS
  Filled 2024-11-30: qty 10

## 2024-11-30 MED ORDER — SODIUM CHLORIDE 0.9 % IV SOLN
2.0000 g | Freq: Once | INTRAVENOUS | Status: AC
  Administered 2024-11-30: 2 g via INTRAVENOUS
  Filled 2024-11-30: qty 20

## 2024-11-30 MED ORDER — VANCOMYCIN HCL IN DEXTROSE 1-5 GM/200ML-% IV SOLN
1000.0000 mg | INTRAVENOUS | Status: DC
  Administered 2024-12-02: 1000 mg via INTRAVENOUS
  Filled 2024-11-30: qty 200

## 2024-11-30 NOTE — Progress Notes (Signed)
 Pharmacy Antibiotic Note  [name] is a 89 y.o. female admitted on 11/30/2024 with enterococcus UTI.  Pharmacy has been consulted for vancomycin  dosing.  Plan: Give vancomycin  1500 mg IV x1 followed by 1000 mg IV Q48H. Goal AUC 400-550. Expected AUC: 495.5 Expected Css min: 12 SCr used: 1.11  Weight used: IBW, Vd used: 0.72 (BMI 28.94) Continue to monitor renal function and follow culture results   Weight: 65 kg (143 lb 4.8 oz)  Temp (24hrs), Avg:98.3 F (36.8 C), Min:97.9 F (36.6 C), Max:98.6 F (37 C)  Recent Labs  Lab 11/30/24 1534  WBC 7.2  CREATININE 1.11*    CrCl cannot be calculated (Unknown ideal weight.).    Allergies[1]  Antimicrobials this admission:   Dose adjustments this admission:   Microbiology results: 1/12 BCx: IP 1/12 UCx: IP    Thank you for allowing pharmacy to be a part of this patients care.  Lum VEAR Mania, PharmD 11/30/2024 5:44 PM     [1]  Allergies Allergen Reactions   Macrobid [Nitrofurantoin] Diarrhea and Nausea And Vomiting   Toprol  Xl [Metoprolol  Succinate] Other (See Comments)    Hair loss

## 2024-11-30 NOTE — ED Provider Notes (Signed)
 " [place] EMERGENCY DEPARTMENT AT [facility] Provider Note  CSN: [id number] Arrival date & time: 11/30/24 1445  Chief Complaint(s) UTI  HPI [name] is a 89 y.o. female history of atrial fibrillation on Eliquis , diabetes, presenting to the emergency department from primary doctor office for weakness.  Patient apparently had urine testing done there that was concerning for possible UTI.  Patient otherwise denies any complaints and is not sure why she is in the hospital.  History limited due to altered mental status.   Past Medical History Past Medical History:  Diagnosis Date   Atrial fibrillation (HCC)    Coronary atherosclerosis of native coronary artery    Nonobstructive   Dyslipidemia    PSVT (paroxysmal supraventricular tachycardia)    TIA (transient ischemic attack)    Type 2 diabetes mellitus (HCC)    Diet controlled   Patient Active Problem List   Diagnosis Date Noted   Hyponatremia 03/15/2024   Acute pyelonephritis 12/06/2022   Hypoalbuminemia 12/06/2022   Transaminitis 12/06/2022   Hypokalemia 12/06/2022   Atrial fibrillation with RVR (HCC) 12/05/2022   Sepsis (HCC) 11/23/2022   Fall at home, initial encounter 10/04/2022   Hypoalbuminemia due to protein-calorie malnutrition 10/04/2022   Sepsis secondary to UTI (HCC) 10/03/2022   Chronic diastolic CHF (congestive heart failure) (HCC) 09/24/2022   COVID-19 virus infection 09/20/2022   Diverticulitis of colon    Pressure injury of skin 08/18/2022   Dyslipidemia    Type 2 diabetes mellitus (HCC)    Hypomagnesemia 05/03/2022   Anemia of chronic disease 05/03/2022   B12 deficiency 05/03/2022   Paroxysmal atrial fibrillation (HCC) 05/02/2022   Acute metabolic encephalopathy 05/01/2022   Acute cystitis without hematuria 12/01/2021   Current use of long term anticoagulation 08/15/2021   Generalized weakness 08/15/2021   Body mass index 29.0-29.9, adult 07/12/2017   Low back pain 07/12/2017    Proteinuria 11/21/2016   Chronic kidney disease, stage III (moderate) (HCC) 11/19/2016   Vascular insufficiency of intestine 06/23/2014   Gouty arthropathy 06/11/2014   Osteoarthrosis 07/24/2013   Coronary atherosclerosis of native coronary artery 10/13/2012   Essential hypertension, benign 09/12/2011   Home Medication(s) Prior to Admission medications  Medication Sig Start Date End Date Taking? Authorizing Provider  acetaminophen  (TYLENOL ) 325 MG tablet Take 2 tablets (650 mg total) by mouth every 6 (six) hours as needed for mild pain, fever or headache (or Fever >/= 101). 10/08/22   Pearlean Manus, MD  atenolol  (TENORMIN ) 50 MG tablet TAKE 1 TABLET BY MOUTH TWICE A DAY 07/15/24   Debera Jayson KANDICE, MD  AZO CRANBERRY GUMMIES PO Take 2 tablets by mouth daily.    [provider]  D-MANNOSE PO Take 1 capsule by mouth with breakfast, with lunch, and with evening meal.    [provider]  diltiazem  (CARDIZEM  CD) 120 MG 24 hr capsule TAKE 1 CAPSULE BY MOUTH EVERY DAY 08/07/24   Debera Jayson KANDICE, MD  ELIQUIS  2.5 MG TABS tablet TAKE 1 TABLET BY MOUTH TWICE A DAY 06/15/24   Debera Jayson KANDICE, MD  metFORMIN  (GLUCOPHAGE ) 500 MG tablet Take 1-2 tablets (500-1,000 mg total) by mouth See admin instructions. 1000 mg in the morning, 500 mg at bedtime. 03/18/24   Pearlean Manus, MD  Multiple Vitamin (MULTIVITAMIN) LIQD Take 15 mLs by mouth daily. 11/27/22   Shahmehdi, Adriana LABOR, MD  sulfamethoxazole -trimethoprim  (BACTRIM ) 400-80 MG tablet Take 1 tablet by mouth daily. 05/12/24   [provider]  Past Surgical History Past Surgical History:  Procedure Laterality Date   ABDOMINAL HYSTERECTOMY     CARPAL TUNNEL RELEASE     x's 2   CHOLECYSTECTOMY     REPLACEMENT TOTAL KNEE  2002 & 2012   SHOULDER SURGERY  2001   Family History Family History   Problem Relation Age of Onset   Coronary artery disease Mother     Social History Social History[1] Allergies Macrobid [nitrofurantoin] and Toprol  xl [metoprolol  succinate]  Review of Systems Review of Systems  All other systems reviewed and are negative.   Physical Exam Vital Signs  I have reviewed the triage vital signs BP (!) 102/54   Pulse 90   Temp 98.6 F (37 C) (Oral)   Resp (!) 31   Ht 4' 11 (1.499 m)   Wt 65 kg   SpO2 95%   BMI 28.94 kg/m  Physical Exam Vitals and nursing note reviewed.  Constitutional:      General: She is not in acute distress.    Appearance: She is well-developed.  HENT:     Head: Normocephalic and atraumatic.     Mouth/Throat:     Mouth: Mucous membranes are moist.  Eyes:     Pupils: Pupils are equal, round, and reactive to light.  Cardiovascular:     Rate and Rhythm: Normal rate and regular rhythm.     Heart sounds: No murmur heard. Pulmonary:     Effort: Pulmonary effort is normal. No respiratory distress.     Breath sounds: Normal breath sounds.  Abdominal:     General: Abdomen is flat.     Palpations: Abdomen is soft.     Tenderness: There is abdominal tenderness (lower abdomen).  Musculoskeletal:        General: No tenderness.     Right lower leg: No edema.     Left lower leg: No edema.  Skin:    General: Skin is warm and dry.  Neurological:     General: No focal deficit present.     Mental Status: She is alert. Mental status is at baseline.     Comments: Follows commands, moves all 4 extremities equally, no facial droop or cranial nerve deficit.  Oriented to self, knows she is in the hospital, otherwise  Psychiatric:        Mood and Affect: Mood normal.        Behavior: Behavior normal.     ED Results and Treatments Labs (all labs ordered are listed, but only abnormal results are displayed) Labs Reviewed  CBC WITH DIFFERENTIAL/PLATELET - Abnormal; Notable for the following components:      Result Value   RBC  3.45 (*)    Hemoglobin 10.7 (*)    HCT 33.7 (*)    Lymphs Abs 0.6 (*)    All other components within normal limits  URINALYSIS, W/ REFLEX TO CULTURE (INFECTION SUSPECTED) - Abnormal; Notable for the following components:   APPearance TURBID (*)    Protein, ur 100 (*)    Leukocytes,Ua LARGE (*)    Bacteria, UA MANY (*)    Non Squamous Epithelial 0-5 (*)    All other components within normal limits  BASIC METABOLIC PANEL WITH GFR - Abnormal; Notable for the following components:   Sodium 128 (*)    Chloride 93 (*)    CO2 16 (*)    Glucose, Bld 187 (*)    BUN 26 (*)    Creatinine, Ser 1.11 (*)    GFR, Estimated  46 (*)    Anion gap 19 (*)    All other components within normal limits  LACTIC ACID, PLASMA - Abnormal; Notable for the following components:   Lactic Acid, Venous 2.6 (*)    All other components within normal limits  CULTURE, BLOOD (ROUTINE X 2)  CULTURE, BLOOD (ROUTINE X 2)  URINE CULTURE  MRSA NEXT GEN BY PCR, NASAL  LACTIC ACID, PLASMA  COMPREHENSIVE METABOLIC PANEL WITH GFR  CBC  MAGNESIUM   PHOSPHORUS                                                                                                                          Radiology CT ABDOMEN PELVIS WO CONTRAST Result Date: 11/30/2024 EXAM: CT ABDOMEN AND PELVIS WITHOUT CONTRAST 11/30/2024 08:42:40 PM TECHNIQUE: CT of the abdomen and pelvis was performed without the administration of intravenous contrast. Multiplanar reformatted images are provided for review. Automated exposure control, iterative reconstruction, and/or weight-based adjustment of the mA/kV was utilized to reduce the radiation dose to as low as reasonably achievable. COMPARISON: 12/05/2022 CLINICAL HISTORY: Acute lower abdominal pain. FINDINGS: LOWER CHEST: No acute abnormality. LIVER: The liver is unremarkable. GALLBLADDER AND BILE DUCTS: The gallbladder has been surgically removed. No biliary ductal dilatation. SPLEEN: No acute abnormality. PANCREAS: No  acute abnormality. ADRENAL GLANDS: No acute abnormality. KIDNEYS, URETERS AND BLADDER: The kidneys demonstrate bilateral significant hydronephrosis and hydroureter, increased when compared with the prior exam. The bladder is well distended with mild bladder wall thickening. These changes likely represent chronic bladder outlet obstruction with subsequent ureteral dilatation. No calculi are identified. No perinephric or periureteral stranding. GI AND BOWEL: Stomach demonstrates no acute abnormality. Small bowel is unremarkable. No obstructive or inflammatory changes of the colon are seen. The appendix is within normal limits. There is no bowel obstruction. PERITONEUM AND RETROPERITONEUM: No ascites. No free air. VASCULATURE: Aorta is normal in caliber. Aortic calcifications are seen without aneurysmal dilatation. LYMPH NODES: No lymphadenopathy. REPRODUCTIVE ORGANS: The uterus has been surgically removed. BONES AND SOFT TISSUES: No acute osseous abnormality is noted. Stable scoliosis concave to the right is noted in the lumbar spine. No focal soft tissue abnormality. IMPRESSION: 1. Bilateral significant hydronephrosis and hydroureter, increased compared to prior exam, likely representing chronic bladder outlet obstruction with subsequent ureteral dilatation. No calculi identified. 2. Mild bladder wall thickening. Electronically signed by: Oneil Devonshire MD MD 11/30/2024 09:30 PM EST RP Workstation: [id number]    Pertinent labs & imaging results that were available during my care of the patient were reviewed by me and considered in my medical decision making (see MDM for details).  Medications Ordered in ED Medications  vancomycin  (VANCOREADY) IVPB 1500 mg/300 mL (0 mg Intravenous Stopped 11/30/24 2156)    Followed by  vancomycin  (VANCOCIN ) IVPB 1000 mg/200 mL premix (has no administration in time range)  diltiazem  (CARDIZEM ) 1 mg/mL load via infusion 10 mg (10 mg Intravenous Bolus from Bag 11/30/24 1942)    And   diltiazem  (CARDIZEM ) 125  mg in dextrose  5% 125 mL (1 mg/mL) infusion (10 mg/hr Intravenous Rate/Dose Change 11/30/24 2024)  lactated ringers  infusion ( Intravenous New Bag/Given 11/30/24 2147)  Chlorhexidine  Gluconate Cloth 2 % PADS 6 each (has no administration in time range)  acetaminophen  (TYLENOL ) tablet 650 mg (has no administration in time range)    Or  acetaminophen  (TYLENOL ) suppository 650 mg (has no administration in time range)  ondansetron  (ZOFRAN ) tablet 4 mg (has no administration in time range)    Or  ondansetron  (ZOFRAN ) injection 4 mg (has no administration in time range)  sodium chloride  0.9 % bolus 1,000 mL (0 mLs Intravenous Stopped 11/30/24 1953)  cefTRIAXone  (ROCEPHIN ) 2 g in sodium chloride  0.9 % 100 mL IVPB (0 g Intravenous Stopped 11/30/24 1819)  sodium chloride  0.9 % bolus 1,000 mL (0 mLs Intravenous Stopped 11/30/24 2147)                                                                                                                                     Procedures .Critical Care  Performed by: Francesca Elsie CROME, MD Authorized by: Francesca Elsie CROME, MD   Critical care provider statement:    Critical care time (minutes):  45   Critical care was necessary to treat or prevent imminent or life-threatening deterioration of the following conditions:  Sepsis and cardiac failure   Critical care was time spent personally by me on the following activities:  Development of treatment plan with patient or surrogate, discussions with consultants, evaluation of patient's response to treatment, examination of patient, ordering and review of laboratory studies, ordering and review of radiographic studies, ordering and performing treatments and interventions, pulse oximetry, re-evaluation of patient's condition and review of old charts   Care discussed with: admitting provider     (including critical care time)  Medical Decision Making / ED Course   MDM:  89 year old  presenting to the emergency department with possible UTI.  Patient is overall well-appearing, no acute distress.  In A-fib with mild tachycardia.  Patient oriented to self and hospital but otherwise unable to provide much history and does not know why she is here, but is awake and alert and following commands without focal deficit.  Will attempt to obtain additional history from family.  Per EMS history sounds as if patient may have been diagnosed with UTI.  Differential includes UTI, pyelonephritis, nephrolithiasis, diverticulitis, appendicitis.  Given tenderness will obtain CT scan.  Will reassess.  Clinical Course as of 11/30/24 2341  Mon Nov 30, 2024  1635 Patient's daughter is here, reports that at baseline patient has mild dementia but able to converse and generally oriented.  She gets weak and confused whenever she has UTI, went to primary doctor's office today and UA showed possible UTI.  She has been on couple recent courses of antibiotics so was referred to ER given failure of outpatient treatment. [WS]  2158 Discussed with Dr. Manfred for  admission. CT shows bladder outlet obstruction so will place foley. Lactic acid has normalized.  [WS]    Clinical Course User Index [WS] Francesca Elsie CROME, MD     Additional history obtained: -Additional history obtained from family and ems -External records from outside source obtained and reviewed including: Chart review including previous notes, labs, imaging, consultation notes including prior notes    Lab Tests: -I ordered, reviewed, and interpreted labs.   The pertinent results include:   Labs Reviewed  CBC WITH DIFFERENTIAL/PLATELET - Abnormal; Notable for the following components:      Result Value   RBC 3.45 (*)    Hemoglobin 10.7 (*)    HCT 33.7 (*)    Lymphs Abs 0.6 (*)    All other components within normal limits  URINALYSIS, W/ REFLEX TO CULTURE (INFECTION SUSPECTED) - Abnormal; Notable for the following components:    APPearance TURBID (*)    Protein, ur 100 (*)    Leukocytes,Ua LARGE (*)    Bacteria, UA MANY (*)    Non Squamous Epithelial 0-5 (*)    All other components within normal limits  BASIC METABOLIC PANEL WITH GFR - Abnormal; Notable for the following components:   Sodium 128 (*)    Chloride 93 (*)    CO2 16 (*)    Glucose, Bld 187 (*)    BUN 26 (*)    Creatinine, Ser 1.11 (*)    GFR, Estimated 46 (*)    Anion gap 19 (*)    All other components within normal limits  LACTIC ACID, PLASMA - Abnormal; Notable for the following components:   Lactic Acid, Venous 2.6 (*)    All other components within normal limits  CULTURE, BLOOD (ROUTINE X 2)  CULTURE, BLOOD (ROUTINE X 2)  URINE CULTURE  MRSA NEXT GEN BY PCR, NASAL  LACTIC ACID, PLASMA  COMPREHENSIVE METABOLIC PANEL WITH GFR  CBC  MAGNESIUM   PHOSPHORUS    Notable for UTI, lactic acidosis  EKG   EKG Interpretation Date/Time:  Monday November 30 2024 16:19:20 EST Ventricular Rate:  129 PR Interval:    QRS Duration:  75 QT Interval:  329 QTC Calculation: 437 R Axis:   71  Text Interpretation: Atrial fibrillation Confirmed by Francesca Elsie ([id number]) on 11/30/2024 5:42:19 PM         Imaging Studies ordered: I ordered imaging studies including CT abdomen On my interpretation imaging demonstrates bladder outlet obstruction I independently visualized and interpreted imaging. I agree with the radiologist interpretation   Medicines ordered and prescription drug management: Meds ordered this encounter  Medications   sodium chloride  0.9 % bolus 1,000 mL   cefTRIAXone  (ROCEPHIN ) 2 g in sodium chloride  0.9 % 100 mL IVPB    Antibiotic Indication::   UTI   FOLLOWED BY Linked Order Group    vancomycin  (VANCOREADY) IVPB 1500 mg/300 mL     Indication::   Other Indication (list below)     Other Indication::   UTI    vancomycin  (VANCOCIN ) IVPB 1000 mg/200 mL premix     Indication::   Other Indication (list below)     Other  Indication::   UTI   sodium chloride  0.9 % bolus 1,000 mL   AND Linked Order Group    diltiazem  (CARDIZEM ) 1 mg/mL load via infusion 10 mg    diltiazem  (CARDIZEM ) 125 mg in dextrose  5% 125 mL (1 mg/mL) infusion   lactated ringers  infusion   Chlorhexidine  Gluconate Cloth 2 % PADS 6 each  OR Linked Order Group    acetaminophen  (TYLENOL ) tablet 650 mg    acetaminophen  (TYLENOL ) suppository 650 mg   OR Linked Order Group    ondansetron  (ZOFRAN ) tablet 4 mg    ondansetron  (ZOFRAN ) injection 4 mg    -I have reviewed the patients home medicines and have made adjustments as needed   Consultations Obtained: I requested consultation with the hospitalist,  and discussed lab and imaging findings as well as pertinent plan - they recommend: admission   Cardiac Monitoring: The patient was maintained on a cardiac monitor.  I personally viewed and interpreted the cardiac monitored which showed an underlying rhythm of: afib rvr   Reevaluation: After the interventions noted above, I reevaluated the patient and found that their symptoms have improved  Co morbidities that complicate the patient evaluation  Past Medical History:  Diagnosis Date   Atrial fibrillation (HCC)    Coronary atherosclerosis of native coronary artery    Nonobstructive   Dyslipidemia    PSVT (paroxysmal supraventricular tachycardia)    TIA (transient ischemic attack)    Type 2 diabetes mellitus (HCC)    Diet controlled      Dispostion: Disposition decision including need for hospitalization was considered, and patient admitted to the hospital.    Final Clinical Impression(s) / ED Diagnoses Final diagnoses:  Sepsis secondary to UTI [facility])  Atrial fibrillation with rapid ventricular response (HCC)  Bladder outlet obstruction  Acute metabolic encephalopathy     This chart was dictated using voice recognition software.  Despite best efforts to proofread,  errors can occur which can change the documentation  meaning.      [1]  Social History Tobacco Use   Smoking status: Never   Smokeless tobacco: Never  Vaping Use   Vaping status: Never Used  Substance Use Topics   Alcohol  use: No    Alcohol /week: 0.0 standard drinks of alcohol    Drug use: No     Francesca Elsie CROME, MD 11/30/24 2342  "

## 2024-11-30 NOTE — Progress Notes (Signed)
 CODE SEPSIS - PHARMACY COMMUNICATION  **Broad Spectrum Antibiotics should be administered within 1 hour of Sepsis diagnosis**  Time Code Sepsis Called/Page Received: 2138  Antibiotics Ordered: ceftriaxone  and vancomycin   Time of 1st antibiotic administration: 1749  Additional action taken by pharmacy: None  If necessary, Name of Provider/Nurse Contacted: None    Damien Napoleon ,PharmD Clinical Pharmacist  11/30/2024  9:52 PM

## 2024-11-30 NOTE — ED Notes (Signed)
 ED Provider at bedside.

## 2024-11-30 NOTE — ED Triage Notes (Signed)
 Pt BIB [facility] for UTI. Pt sent by PCP, UA positive for UTI this morning. Pt has been recently weak and not walking as much. Hx of AFIB. Pt takes eliquis .

## 2024-11-30 NOTE — Sepsis Progress Note (Signed)
 Elink following for sepsis protocol.

## 2024-11-30 NOTE — H&P (Signed)
 " History and Physical    Patient: [name] FMW:[id number] DOB: 12-08-1926 DOA: 11/30/2024 DOS: the patient was seen and examined on 11/30/2024 PCP: Atilano Deward ORN, MD  Patient coming from: {Point_of_Origin:[address]777}  Chief Complaint:  Chief Complaint  Patient presents with   UTI   HPI: [name] is a 89 y.o. female with medical history significant of ***  Review of Systems: {ROS_Text:26778} Past Medical History:  Diagnosis Date   Atrial fibrillation (HCC)    Coronary atherosclerosis of native coronary artery    Nonobstructive   Dyslipidemia    PSVT (paroxysmal supraventricular tachycardia)    TIA (transient ischemic attack)    Type 2 diabetes mellitus (HCC)    Diet controlled   Past Surgical History:  Procedure Laterality Date   ABDOMINAL HYSTERECTOMY     CARPAL TUNNEL RELEASE     x's 2   CHOLECYSTECTOMY     REPLACEMENT TOTAL KNEE  2002 & 2012   SHOULDER SURGERY  2001   Social History:  reports that she has never smoked. She has never used smokeless tobacco. She reports that she does not drink alcohol  and does not use drugs.  Allergies[1]  Family History  Problem Relation Age of Onset   Coronary artery disease Mother     Prior to Admission medications  Medication Sig Start Date End Date Taking? Authorizing Provider  acetaminophen  (TYLENOL ) 325 MG tablet Take 2 tablets (650 mg total) by mouth every 6 (six) hours as needed for mild pain, fever or headache (or Fever >/= 101). 10/08/22   Pearlean Manus, MD  atenolol  (TENORMIN ) 50 MG tablet TAKE 1 TABLET BY MOUTH TWICE A DAY 07/15/24   Debera Jayson KANDICE, MD  AZO CRANBERRY GUMMIES PO Take 2 tablets by mouth daily.    [provider]  D-MANNOSE PO Take 1 capsule by mouth with breakfast, with lunch, and with evening meal.    [provider]  diltiazem  (CARDIZEM  CD) 120 MG 24 hr capsule TAKE 1 CAPSULE BY MOUTH EVERY DAY 08/07/24   Debera Jayson KANDICE, MD  ELIQUIS  2.5 MG TABS tablet TAKE 1 TABLET BY MOUTH  TWICE A DAY 06/15/24   Debera Jayson KANDICE, MD  metFORMIN  (GLUCOPHAGE ) 500 MG tablet Take 1-2 tablets (500-1,000 mg total) by mouth See admin instructions. 1000 mg in the morning, 500 mg at bedtime. 03/18/24   Pearlean Manus, MD  Multiple Vitamin (MULTIVITAMIN) LIQD Take 15 mLs by mouth daily. 11/27/22   Shahmehdi, Adriana LABOR, MD  sulfamethoxazole -trimethoprim  (BACTRIM ) 400-80 MG tablet Take 1 tablet by mouth daily. 05/12/24   [provider]    Physical Exam: Vitals:   11/30/24 1945 11/30/24 2115 11/30/24 2130 11/30/24 2149  BP: 113/74   (!) 105/55  Pulse: (!) 116 97 93 92  Resp: (!) 28 (!) 33 (!) 32 (!) 29  Temp:    98.6 F (37 C)  TempSrc:    Oral  SpO2: 97% 97% 96% 96%  Weight:      Height:       *** Data Reviewed: {Tip this will not be part of the note when signed- Document your independent interpretation of telemetry tracing, EKG, lab, Radiology test or any other diagnostic tests. Add any new diagnostic test ordered today. (Optional):26781} {Results:26384}  Assessment and Plan: No notes have been filed under this hospital service. Service: Hospitalist     Advance Care Planning:   Code Status: Prior ***  Consults: ***  Family Communication: ***  Severity of Illness: {Observation/Inpatient:21159}  Chartered Loss Adjuster:  Posey Maier, DO 11/30/2024 10:00 PM  For on call review [email or web address].     [1]  Allergies Allergen Reactions   Macrobid [Nitrofurantoin] Diarrhea and Nausea And Vomiting   Toprol  Xl [Metoprolol  Succinate] Other (See Comments)    Hair loss   "

## 2024-11-30 NOTE — ED Notes (Signed)
 Daughter at bedside states pt's urine culture was positive for Enterococcus Saecalis last year and has had two rounds of antibiotics recently.  Urine was collected at home and sent at [facility] office and reported was positive for UTI today.

## 2024-12-01 ENCOUNTER — Inpatient Hospital Stay (HOSPITAL_COMMUNITY)

## 2024-12-01 LAB — OSMOLALITY, URINE: Osmolality, Ur: 347 mosm/kg (ref 300–900)

## 2024-12-01 LAB — CBC
HCT: 27.1 % — ABNORMAL LOW (ref 36.0–46.0)
Hemoglobin: 8.9 g/dL — ABNORMAL LOW (ref 12.0–15.0)
MCH: 31.1 pg (ref 26.0–34.0)
MCHC: 32.8 g/dL (ref 30.0–36.0)
MCV: 94.8 fL (ref 80.0–100.0)
Platelets: 385 K/uL (ref 150–400)
RBC: 2.86 MIL/uL — ABNORMAL LOW (ref 3.87–5.11)
RDW: 13.2 % (ref 11.5–15.5)
WBC: 9 K/uL (ref 4.0–10.5)
nRBC: 0 % (ref 0.0–0.2)

## 2024-12-01 LAB — COMPREHENSIVE METABOLIC PANEL WITH GFR
ALT: 26 U/L (ref 0–44)
AST: 21 U/L (ref 15–41)
Albumin: 3.3 g/dL — ABNORMAL LOW (ref 3.5–5.0)
Alkaline Phosphatase: 60 U/L (ref 38–126)
Anion gap: 19 — ABNORMAL HIGH (ref 5–15)
BUN: 20 mg/dL (ref 8–23)
CO2: 14 mmol/L — ABNORMAL LOW (ref 22–32)
Calcium: 8.2 mg/dL — ABNORMAL LOW (ref 8.9–10.3)
Chloride: 98 mmol/L (ref 98–111)
Creatinine, Ser: 0.83 mg/dL (ref 0.44–1.00)
GFR, Estimated: >60 mL/min
Glucose, Bld: 175 mg/dL — ABNORMAL HIGH (ref 70–99)
Potassium: 3.8 mmol/L (ref 3.5–5.1)
Sodium: 130 mmol/L — ABNORMAL LOW (ref 135–145)
Total Bilirubin: 0.3 mg/dL (ref 0.0–1.2)
Total Protein: 6 g/dL — ABNORMAL LOW (ref 6.5–8.1)

## 2024-12-01 LAB — BASIC METABOLIC PANEL WITH GFR
Anion gap: 20 — ABNORMAL HIGH (ref 5–15)
Anion gap: 18 — ABNORMAL HIGH (ref 5–15)
BUN: 21 mg/dL (ref 8–23)
BUN: 20 mg/dL (ref 8–23)
CO2: 14 mmol/L — ABNORMAL LOW (ref 22–32)
CO2: 14 mmol/L — ABNORMAL LOW (ref 22–32)
Calcium: 8.8 mg/dL — ABNORMAL LOW (ref 8.9–10.3)
Calcium: 8.3 mg/dL — ABNORMAL LOW (ref 8.9–10.3)
Chloride: 97 mmol/L — ABNORMAL LOW (ref 98–111)
Chloride: 100 mmol/L (ref 98–111)
Creatinine, Ser: 1.01 mg/dL — ABNORMAL HIGH (ref 0.44–1.00)
Creatinine, Ser: 0.86 mg/dL (ref 0.44–1.00)
GFR, Estimated: 51 mL/min — ABNORMAL LOW
GFR, Estimated: >60 mL/min
Glucose, Bld: 152 mg/dL — ABNORMAL HIGH (ref 70–99)
Glucose, Bld: 103 mg/dL — ABNORMAL HIGH (ref 70–99)
Potassium: 3.9 mmol/L (ref 3.5–5.1)
Potassium: 3.9 mmol/L (ref 3.5–5.1)
Sodium: 131 mmol/L — ABNORMAL LOW (ref 135–145)
Sodium: 131 mmol/L — ABNORMAL LOW (ref 135–145)

## 2024-12-01 LAB — PHOSPHORUS: Phosphorus: 2.4 mg/dL — ABNORMAL LOW (ref 2.5–4.6)

## 2024-12-01 LAB — MRSA NEXT GEN BY PCR, NASAL: MRSA by PCR Next Gen: NOT DETECTED

## 2024-12-01 LAB — GLUCOSE, CAPILLARY
Glucose-Capillary: 154 mg/dL — ABNORMAL HIGH (ref 70–99)
Glucose-Capillary: 168 mg/dL — ABNORMAL HIGH (ref 70–99)
Glucose-Capillary: 117 mg/dL — ABNORMAL HIGH (ref 70–99)
Glucose-Capillary: 134 mg/dL — ABNORMAL HIGH (ref 70–99)

## 2024-12-01 LAB — MAGNESIUM: Magnesium: 1.2 mg/dL — ABNORMAL LOW (ref 1.7–2.4)

## 2024-12-01 LAB — HEMOGLOBIN A1C
Hgb A1c MFr Bld: 6.6 % — ABNORMAL HIGH (ref 4.8–5.6)
Mean Plasma Glucose: 142.72 mg/dL

## 2024-12-01 LAB — OSMOLALITY: Osmolality: 282 mosm/kg (ref 275–295)

## 2024-12-01 LAB — SODIUM, URINE, RANDOM: Sodium, Ur: 65 mmol/L

## 2024-12-01 MED ORDER — APIXABAN 2.5 MG PO TABS
2.5000 mg | ORAL_TABLET | Freq: Two times a day (BID) | ORAL | Status: DC
  Administered 2024-12-01 – 2024-12-03 (×5): 2.5 mg via ORAL
  Filled 2024-12-01 (×5): qty 1

## 2024-12-01 MED ORDER — ATENOLOL 25 MG PO TABS
50.0000 mg | ORAL_TABLET | Freq: Two times a day (BID) | ORAL | Status: DC
  Administered 2024-12-01 – 2024-12-03 (×5): 50 mg via ORAL
  Filled 2024-12-01 (×5): qty 2

## 2024-12-01 MED ORDER — DILTIAZEM HCL ER COATED BEADS 120 MG PO CP24
120.0000 mg | ORAL_CAPSULE | Freq: Every day | ORAL | Status: DC
  Administered 2024-12-01 – 2024-12-03 (×3): 120 mg via ORAL
  Filled 2024-12-01 (×3): qty 1

## 2024-12-01 MED ORDER — SODIUM CHLORIDE 0.9 % IV SOLN
2.0000 g | INTRAVENOUS | Status: DC
  Administered 2024-12-01 – 2024-12-03 (×3): 2 g via INTRAVENOUS
  Filled 2024-12-01 (×3): qty 20

## 2024-12-01 MED ORDER — SODIUM CHLORIDE 0.9 % IV SOLN: 1.0000 g | INTRAVENOUS | Status: DC

## 2024-12-01 MED ORDER — LACTATED RINGERS IV BOLUS
1000.0000 mL | Freq: Once | INTRAVENOUS | Status: AC
  Administered 2024-12-01: 1000 mL via INTRAVENOUS

## 2024-12-01 MED ORDER — INSULIN ASPART 100 UNIT/ML IJ SOLN
0.0000 [IU] | Freq: Three times a day (TID) | INTRAMUSCULAR | Status: DC
  Administered 2024-12-01 (×2): 2 [IU] via SUBCUTANEOUS
  Administered 2024-12-02: 1 [IU] via SUBCUTANEOUS
  Administered 2024-12-02: 2 [IU] via SUBCUTANEOUS
  Administered 2024-12-03: 3 [IU] via SUBCUTANEOUS
  Administered 2024-12-03: 2 [IU] via SUBCUTANEOUS
  Filled 2024-12-01 (×5): qty 1

## 2024-12-01 NOTE — Progress Notes (Signed)
 " PROGRESS NOTE    [name]  FMW:[id number] DOB: 25-Aug-1927 DOA: 11/30/2024 PCP: Atilano Deward ORN, MD   Brief Narrative:   89 year old female with history of hypertension, A-fib on Eliquis , type 2 diabetes, CKD stage III, recurrent UTI, presented to the ER with complaints of generalized weakness and altered mental status.  Patient has had increasing confusion in the past 2 to 3 weeks.  She has received antibiotics as an outpatient but symptoms did not improve.  In the ER she was tachypneic, tachycardic.  EKG with A-fib with RVR.  Initiated on Cardizem  drip.  CBC revealed normocytic anemia, BMP with sodium 128, bicarb 16, potassium 4.3, BUN 26 and creatinine 1.1, UA was concerning for UTI, lactic acid was also elevated 2.6 concerning for sepsis.  This improved to 1.9 with fluids. CT scan of abdomen and pelvis without contrast showed significant bilateral hydronephrosis worse compared to past, no obstructing stone.  Also dilatation of urinary bladder. Foley placed in the ER  Patient admitted for further management.  Assessment & Plan:   Principal Problem:   Sepsis secondary to UTI [facility] [place]) Active Problems:   Acute metabolic encephalopathy   Chronic diastolic CHF (congestive heart failure) (HCC)   Paroxysmal atrial fibrillation (HCC)   Generalized weakness   Atrial fibrillation with RVR (HCC)   Hyponatremia    Sepsis secondary to UTI POA Bladder outlet obstruction - We will continue to treat with broad-spectrum antibiotics ceftriaxone  and vancomycin .  Previous culture positive for Enterococcus faecalis.  Will follow-up on urine and blood culture.  Lactic acid improved.   -Vital signs are stabilized. -CT with no pyelonephritis or abscess.  shows bilateral hydronephrosis with no obstructive lesions.   -Foley catheter placed in the ER. - Needs outpatient urology follow-up   Paroxysmal atrial fibrillation with RVR Rate improved with IV Cardizem , will resume home Cardizem ,  atenolol  and  discontinue Cardizem  drip.  Hyponatremia (chronic) Dehydration Na 128 on presentation, chronically low.  131 today.  Will DC IV fluid   Type 2 diabetes mellitus with hyperglycemia Continue ISS and hypoglycemia protocol Metformin  will be held at this time   Essential hypertension (controlled) Resume atenolol , diltiazem   Chronic anemia: Hemoglobin 8.9, likely dilutional.  Will continue to monitor         Advance Care Planning: DNR   Consults: None   Family Communication: son at bedside   Severity of Illness: The appropriate patient status for this patient is INPATIENT. Inpatient status is judged to be reasonable and necessary in order to provide the required intensity of service to ensure the patient's safety. The patient's presenting symptoms, physical exam findings, and initial radiographic and laboratory data in the context of their chronic comorbidities is felt to place them at high risk for further clinical deterioration. Furthermore, it is not anticipated that the patient will be medically stable for discharge from the hospital within 2 midnights of admission.    * I certify that at the point of admission it is my clinical judgment that the patient will require inpatient hospital care spanning beyond 2 midnights from the point of admission due to high intensity of service, high risk for further deterioration and high frequency of surveillance required.*  Subjective:  Patient seen and examined.  Son at the bedside.  She has significantly improved.  Mentation is normal.  No fever or chills.  Heart rate improved. Objective: Vitals:   12/01/24 0900 12/01/24 1100 12/01/24 1200 12/01/24 1300  BP: 122/66  (!) 159/105 122/68  Pulse: ROLLEN)  102  83 (!) 103  Resp: (!) 39  (!) 32 (!) 32  Temp:  99.9 F (37.7 C)    TempSrc:  Oral    SpO2: 99%  98% 94%  Weight:      Height:        Intake/Output Summary (Last 24 hours) at 12/01/2024 1422 Last data filed at 12/01/2024 1200 Gross per  24 hour  Intake 5390.75 ml  Output 1000 ml  Net 4390.75 ml   Filed Weights   11/30/24 1453 12/01/24 0000  Weight: 65 kg 63.4 kg    Examination:  General exam: Appears calm and comfortable  Respiratory system: Bilateral decreased breath sounds at bases Cardiovascular system: S1 & S2 heard, Rate controlled Gastrointestinal system: Abdomen is nondistended, soft and nontender. Normal bowel sounds heard. Extremities: No cyanosis, clubbing, edema  Central nervous system: Alert and oriented. No focal neurological deficits. Moving extremities Skin: No rashes, lesions or ulcers Psychiatry: Judgement and insight appear normal. Mood & affect appropriate.     Data Reviewed: I have personally reviewed following labs and imaging studies  CBC: Recent Labs  Lab 11/30/24 1534 12/01/24 0455  WBC 7.2 9.0  NEUTROABS 5.7  --   HGB 10.7* 8.9*  HCT 33.7* 27.1*  MCV 97.7 94.8  PLT 391 385   Basic Metabolic Panel: Recent Labs  Lab 11/30/24 1534 12/01/24 0455 12/01/24 1053  NA 128* 130* 131*  K 4.3 3.8 3.9  CL 93* 98 97*  CO2 16* 14* 14*  GLUCOSE 187* 175* 152*  BUN 26* 20 21  CREATININE 1.11* 0.83 1.01*  CALCIUM 9.1 8.2* 8.8*  MG  --  1.2*  --   PHOS  --  2.4*  --    GFR: Estimated Creatinine Clearance: 27 mL/min (A) (by C-G formula based on SCr of 1.01 mg/dL (H)). Liver Function Tests: Recent Labs  Lab 12/01/24 0455  AST 21  ALT 26  ALKPHOS 60  BILITOT 0.3  PROT 6.0*  ALBUMIN 3.3*   No results for input(s): LIPASE, AMYLASE in the last 168 hours. No results for input(s): AMMONIA in the last 168 hours. Coagulation Profile: No results for input(s): INR, PROTIME in the last 168 hours. Cardiac Enzymes: No results for input(s): CKTOTAL, CKMB, CKMBINDEX, TROPONINI in the last 168 hours. BNP (last 3 results) No results for input(s): PROBNP in the last 8760 hours. HbA1C: Recent Labs    11/30/24 1534  HGBA1C 6.6*   CBG: Recent Labs  Lab  12/01/24 0734 12/01/24 1115  GLUCAP 154* 168*   Lipid Profile: No results for input(s): CHOL, HDL, LDLCALC, TRIG, CHOLHDL, LDLDIRECT in the last 72 hours. Thyroid  Function Tests: No results for input(s): TSH, T4TOTAL, FREET4, T3FREE, THYROIDAB in the last 72 hours. Anemia Panel: No results for input(s): VITAMINB12, FOLATE, FERRITIN, TIBC, IRON, RETICCTPCT in the last 72 hours. Sepsis Labs: Recent Labs  Lab 11/30/24 1734 11/30/24 1917  LATICACIDVEN 2.6* 1.9    Recent Results (from the past 240 hours)  Urine Culture     Status: None (Preliminary result)   Collection Time: 11/30/24  4:23 PM   Specimen: Urine, Clean Catch  Result Value Ref Range Status   Specimen Description   Final    URINE, CLEAN CATCH Performed at [facility], [address], [place], [place] [address]    Special Requests   Final    NONE Reflexed from [id number] Performed at [facility], [address], [place], [place] [address]    Culture PENDING  Incomplete  Report Status PENDING  Incomplete  Culture, blood (routine x 2)     Status: None (Preliminary result)   Collection Time: 11/30/24  5:34 PM   Specimen: Left Antecubital; Blood  Result Value Ref Range Status   Specimen Description LEFT ANTECUBITAL AEROBIC BOTTLE ONLY  Final   Special Requests   Final    Blood Culture results may not be optimal due to an inadequate volume of blood received in culture bottles   Culture   Final    NO GROWTH < 12 HOURS Performed at [facility], [address], [place], [place] [address]    Report Status PENDING  Incomplete  Culture, blood (routine x 2)     Status: None (Preliminary result)   Collection Time: 11/30/24  5:34 PM   Specimen: BLOOD LEFT WRIST  Result Value Ref Range Status   Specimen Description BLOOD LEFT WRIST AEROBIC BOTTLE ONLY  Final   Special Requests   Final    Blood Culture results may not be optimal due to an inadequate volume of blood received in culture  bottles   Culture   Final    NO GROWTH < 12 HOURS Performed at [facility], [address], [place], [place] [address]    Report Status PENDING  Incomplete         Radiology Studies: DG CHEST PORT 1 VIEW Result Date: 12/01/2024 EXAM: 1 VIEW(S) XRAY OF THE CHEST 12/01/2024 04:35:00 AM COMPARISON: 03/15/2024 CLINICAL HISTORY: Respiratory abnormality FINDINGS: LUNGS AND PLEURA: Stable elevated right hemidiaphragm. Right basilar atelectasis. Chronic interstitial opacities. No pleural effusion. No pneumothorax. HEART AND MEDIASTINUM: Aortic atherosclerosis. No acute abnormality of the cardiac and mediastinal silhouettes. BONES AND SOFT TISSUES: Polyarticular degenerative changes. Colonic interposition under right hemidiaphragm. No acute osseous abnormality. IMPRESSION: 1. Stable elevated right hemidiaphragm with right basilar atelectasis and chronic interstitial opacities. No acute cardiopulmonary abnormality. 2. Aortic atherosclerosis. 3. Colonic interposition beneath the right hemidiaphragm. 4. Polyarticular degenerative changes. Electronically signed by: Evalene Coho MD MD 12/01/2024 04:59 AM EST RP Workstation: [id number]   CT ABDOMEN PELVIS WO CONTRAST Result Date: 11/30/2024 EXAM: CT ABDOMEN AND PELVIS WITHOUT CONTRAST 11/30/2024 08:42:40 PM TECHNIQUE: CT of the abdomen and pelvis was performed without the administration of intravenous contrast. Multiplanar reformatted images are provided for review. Automated exposure control, iterative reconstruction, and/or weight-based adjustment of the mA/kV was utilized to reduce the radiation dose to as low as reasonably achievable. COMPARISON: 12/05/2022 CLINICAL HISTORY: Acute lower abdominal pain. FINDINGS: LOWER CHEST: No acute abnormality. LIVER: The liver is unremarkable. GALLBLADDER AND BILE DUCTS: The gallbladder has been surgically removed. No biliary ductal dilatation. SPLEEN: No acute abnormality. PANCREAS: No acute abnormality. ADRENAL  GLANDS: No acute abnormality. KIDNEYS, URETERS AND BLADDER: The kidneys demonstrate bilateral significant hydronephrosis and hydroureter, increased when compared with the prior exam. The bladder is well distended with mild bladder wall thickening. These changes likely represent chronic bladder outlet obstruction with subsequent ureteral dilatation. No calculi are identified. No perinephric or periureteral stranding. GI AND BOWEL: Stomach demonstrates no acute abnormality. Small bowel is unremarkable. No obstructive or inflammatory changes of the colon are seen. The appendix is within normal limits. There is no bowel obstruction. PERITONEUM AND RETROPERITONEUM: No ascites. No free air. VASCULATURE: Aorta is normal in caliber. Aortic calcifications are seen without aneurysmal dilatation. LYMPH NODES: No lymphadenopathy. REPRODUCTIVE ORGANS: The uterus has been surgically removed. BONES AND SOFT TISSUES: No acute osseous abnormality is noted. Stable scoliosis concave to the right is noted in the lumbar spine. No  focal soft tissue abnormality. IMPRESSION: 1. Bilateral significant hydronephrosis and hydroureter, increased compared to prior exam, likely representing chronic bladder outlet obstruction with subsequent ureteral dilatation. No calculi identified. 2. Mild bladder wall thickening. Electronically signed by: Oneil Devonshire MD MD 11/30/2024 09:30 PM EST RP Workstation: [id number]        Scheduled Meds:  apixaban   2.5 mg Oral BID   atenolol   50 mg Oral BID   Chlorhexidine  Gluconate Cloth  6 each Topical Daily   diltiazem   120 mg Oral Daily   insulin  aspart  0-9 Units Subcutaneous TID WC   Continuous Infusions:  cefTRIAXone  (ROCEPHIN )  IV Stopped (12/01/24 1043)   diltiazem  (CARDIZEM ) infusion Stopped (12/01/24 0942)   lactated ringers  50 mL/hr at 12/01/24 1200   [START ON 12/02/2024] vancomycin             Derryl Duval, MD [facility] 12/01/2024, 2:22 PM   "

## 2024-12-01 NOTE — Evaluation (Signed)
 Physical Therapy Evaluation Patient Details Name: [name] MRN: [id number] DOB: 1927-08-15 Today's Date: 12/01/2024  History of Present Illness  [name] is a 89 y.o. female with medical history significant of hypertension, atrial fibrillation on Eliquis , T2DM, CKD 3 A who presents to the emergency department due to weakness and altered mental status.  Patient was unable to provide history, history was obtained from daughter at bedside.  Per report, patient has had 3-week onset of increasing confusion, weakness and altered mental status.  Workup done in the ED including urinalysis showed UTI and she was treated with amoxicillin  without much improvement, the course of her amoxicillin  was repeated and on completion of this, she continued to be symptomatic with her urine turning milky which was typical of her symptoms when she has UTI.  She was taken to the ED for further evaluation and management.   Clinical Impression  Patient agreeable to PT/OT co-evaluation. Patients daughter is present during session and completes history taking as pt demonstrates altered cognition at this time due to UTI.  Patients daughter reports at baseline, patient is modified independent with all mobility, requiring AD during ambulation in home but is given some assist with ADLs. This date, patient requires much assist (mod/max) for bed mobility, functional transfers and short steps at bedside with RW. Patient HR reaches 150s during session with nursing staff aware. Pt tolerates sitting in chair at EOS, call button in reach, all needs met, and daughter at bedside. Patient will benefit from continued skilled physical therapy acutely and in recommended venue in order to address the above/below deficits and improve overall function.        If plan is discharge home, recommend the following: A lot of help with walking and/or transfers;A lot of help with bathing/dressing/bathroom;Assist for transportation;Assistance with  feeding;Assistance with cooking/housework;Help with stairs or ramp for entrance;Supervision due to cognitive status   Can travel by private vehicle        Equipment Recommendations None recommended by PT  Recommendations for Other Services       Functional Status Assessment Patient has had a recent decline in their functional status and demonstrates the ability to make significant improvements in function in a reasonable and predictable amount of time.     Precautions / Restrictions Precautions Precautions: Fall Recall of Precautions/Restrictions: Impaired Restrictions Weight Bearing Restrictions Per Provider Order: No      Mobility  Bed Mobility Overal bed mobility: Needs Assistance Bed Mobility: Supine to Sit, Sit to Supine     Supine to sit: Mod assist Sit to supine: Max assist   General bed mobility comments: assisted through managing trunk and BLE due to weakness, pt demo slow labored movement throughout    Transfers Overall transfer level: Needs assistance Equipment used: Rolling walker (2 wheels) Transfers: Sit to/from Stand, Bed to chair/wheelchair/BSC Sit to Stand: Max assist, Mod assist   Step pivot transfers: Mod assist, Max assist       General transfer comment: mod/max assist due to general LE weakness, requires tactile cueing for hand placement with RW as well as proper foot placement prior to any mobility, some assist with RW management, demo slow labored movement    Ambulation/Gait Ambulation/Gait assistance: Max assist, Mod assist Gait Distance (Feet): 2 Feet Assistive device: Rolling walker (2 wheels) Gait Pattern/deviations: Step-to pattern, Decreased step length - right, Decreased step length - left, Trunk flexed Gait velocity: Dec     General Gait Details: pt limited to a few side steps at  bedside with RW and mod/max assist, see above under transfers  Stairs            Wheelchair Mobility     Tilt Bed    Modified Rankin  (Stroke Patients Only)       Balance Overall balance assessment: Needs assistance Sitting-balance support: No upper extremity supported, Feet supported Sitting balance-Leahy Scale: Poor Sitting balance - Comments: Seated at EOB and in chair Postural control: Left lateral lean Standing balance support: Bilateral upper extremity supported, During functional activity, Reliant on assistive device for balance Standing balance-Leahy Scale: Poor Standing balance comment: using RW                             Pertinent Vitals/Pain Pain Assessment Pain Assessment: Faces Faces Pain Scale: No hurt    Home Living Family/patient expects to be discharged to:: Private residence Living Arrangements: Children Available Help at Discharge: Family;Available 24 hours/day Type of Home: House Home Access: Ramped entrance       Home Layout: One level Home Equipment: Agricultural Consultant (2 wheels);Cane - single point;Shower seat;Hospital bed;Transport chair Additional Comments: pt daughter present and confirms previously charted info    Prior Function Prior Level of Function : Needs assist       Physical Assist : ADLs (physical)   ADLs (physical): Bathing;Dressing;Toileting;IADLs;Grooming Mobility Comments: pt daughter reports pt as household ambulator with RW ADLs Comments: pt daughter reports pt Able to feed herself; assisted for all other ADL's. Pt partially assists with grooming. IADL's assisted.     Extremity/Trunk Assessment   Upper Extremity Assessment Upper Extremity Assessment: Defer to OT evaluation RUE Deficits / Details: B UE limited to <50% available A/ROM today. R elbow limited in extension.    Lower Extremity Assessment Lower Extremity Assessment: Generalized weakness    Cervical / Trunk Assessment Cervical / Trunk Assessment: Kyphotic  Communication   Communication Communication: No apparent difficulties    Cognition Arousal: Alert Behavior During Therapy:  WFL for tasks assessed/performed                             Following commands: Intact       Cueing Cueing Techniques: Verbal cues, Tactile cues, Visual cues, Gestural cues     General Comments      Exercises     Assessment/Plan    PT Assessment Patient needs continued PT services;All further PT needs can be met in the next venue of care  PT Problem List Decreased strength;Decreased range of motion;Decreased activity tolerance;Decreased balance;Decreased mobility;Decreased cognition       PT Treatment Interventions DME instruction;Balance training;Gait training;Functional mobility training;Therapeutic activities;Therapeutic exercise;Patient/family education    PT Goals (Current goals can be found in the Care Plan section)  Acute Rehab PT Goals Patient Stated Goal: Return home following rehab stay PT Goal Formulation: With patient Time For Goal Achievement: 12/15/24 Potential to Achieve Goals: Good    Frequency Min 3X/week     Co-evaluation PT/OT/SLP Co-Evaluation/Treatment: Yes Reason for Co-Treatment: To address functional/ADL transfers PT goals addressed during session: Mobility/safety with mobility OT goals addressed during session: ADL's and self-care       AM-PAC PT 6 Clicks Mobility  Outcome Measure Help needed turning from your back to your side while in a flat bed without using bedrails?: A Lot Help needed moving from lying on your back to sitting on the side of a flat bed without using  bedrails?: A Lot Help needed moving to and from a bed to a chair (including a wheelchair)?: A Lot Help needed standing up from a chair using your arms (e.g., wheelchair or bedside chair)?: A Lot Help needed to walk in hospital room?: A Lot Help needed climbing 3-5 steps with a railing? : A Lot 6 Click Score: 12    End of Session Equipment Utilized During Treatment: Gait belt Activity Tolerance: Patient limited by fatigue Patient left: in chair;with call  bell/phone within reach;with family/visitor present Nurse Communication: Mobility status PT Visit Diagnosis: Other abnormalities of gait and mobility (R26.89);Difficulty in walking, not elsewhere classified (R26.2);Muscle weakness (generalized) (M62.81);Unsteadiness on feet (R26.81)    Time: 8872-8848 PT Time Calculation (min) (ACUTE ONLY): 24 min   Charges:   PT Evaluation $PT Eval Moderate Complexity: 1 Mod   PT General Charges $$ ACUTE PT VISIT: 1 Visit         4:41 PM, 12/01/2024 Quinnton Bury Powell-Butler, PT, DPT [place] with [facility]

## 2024-12-01 NOTE — Progress Notes (Signed)
 PT eval pending, IPCM following    12/01/24 1431  TOC Brief Assessment  Insurance and Status Reviewed  Patient has primary care physician Yes  Home environment has been reviewed Home with Family  Prior level of function: Need assistance  Prior/Current Home Services No current home services  Social Drivers of Health Review SDOH reviewed no interventions necessary  Readmission risk has been reviewed Yes  Transition of care needs no transition of care needs at this time   Inpatient Care Manager (ICM) has reviewed patient and no ICM needs have been identified at this time. We will continue to monitor patient advancement through interdisciplinary progression rounds. If new patient transition needs arise, please place a ICM consult.

## 2024-12-01 NOTE — Plan of Care (Signed)
" °  Problem: Acute Rehab OT Goals (only OT should resolve) Goal: Pt. Will Perform Grooming Flowsheets (Taken 12/01/2024 1456) Pt Will Perform Grooming:  with min assist  sitting Goal: Pt. Will Perform Upper Body Dressing Flowsheets (Taken 12/01/2024 1456) Pt Will Perform Upper Body Dressing:  with min assist  sitting Goal: Pt. Will Transfer To Toilet Flowsheets (Taken 12/01/2024 1456) Pt Will Transfer to Toilet:  with supervision  stand pivot transfer  ambulating Goal: Pt/Caregiver Will Perform Home Exercise Program Flowsheets (Taken 12/01/2024 1456) Pt/caregiver will Perform Home Exercise Program:  Increased ROM  Both right and left upper extremity  With minimal assist  Joniya Boberg OT, MOT  "

## 2024-12-01 NOTE — Plan of Care (Signed)
" °  Problem: Acute Rehab PT Goals(only PT should resolve) Goal: Pt Will Go Supine/Side To Sit Outcome: Progressing Flowsheets (Taken 12/01/2024 1642) Pt will go Supine/Side to Sit: with minimal assist Goal: Patient Will Transfer Sit To/From Stand Outcome: Progressing Flowsheets (Taken 12/01/2024 1642) Patient will transfer sit to/from stand: with minimal assist Goal: Pt Will Transfer Bed To Chair/Chair To Bed Outcome: Progressing Flowsheets (Taken 12/01/2024 1642) Pt will Transfer Bed to Chair/Chair to Bed: with min assist Goal: Pt Will Ambulate Outcome: Progressing Flowsheets (Taken 12/01/2024 1642) Pt will Ambulate:  10 feet  with minimal assist  with rolling walker    4:42 PM, 12/01/2024 Rosaria Settler, PT, DPT [place] with [facility]  "

## 2024-12-01 NOTE — Evaluation (Signed)
 Occupational Therapy Evaluation Patient Details Name: [name] MRN: [id number] DOB: 05/11/1927 Today's Date: 12/01/2024   History of Present Illness   [name] is a 89 y.o. female with medical history significant of hypertension, atrial fibrillation on Eliquis , T2DM, CKD 3 A who presents to the emergency department due to weakness and altered mental status.  Patient was unable to provide history, history was obtained from daughter at bedside.  Per report, patient has had 3-week onset of increasing confusion, weakness and altered mental status.  Workup done in the ED including urinalysis showed UTI and she was treated with amoxicillin  without much improvement, the course of her amoxicillin  was repeated and on completion of this, she continued to be symptomatic with her urine turning milky which was typical of her symptoms when she has UTI.  She was taken to the ED for further evaluation and management. (per DO)     Clinical Impressions Pt agreeable to OT and PT co-evaluation, but initially lethargic. [name] present and answering prior function questions. Pt required mod to max A for bed mobility and step pivot transfers with RW. Pt's B UE are very limited at the shoulder which daughter reports is common, but pt also had some elbow extension deficits in the R UE which is not as common according to daughter. Pt is assisted a lot for ADL's at baseline. Today pt continues to require mod to max/total assist for most ADL's. Pt left in the chair with call bell within reach and family present. Pt will benefit from continued OT in the hospital to increase strength, balance, and endurance for safe ADL's.        If plan is discharge home, recommend the following:   A lot of help with walking and/or transfers;A lot of help with bathing/dressing/bathroom;Assistance with cooking/housework;Assist for transportation;Help with stairs or ramp for entrance;Direct supervision/assist for medications  management;Assistance with feeding     Functional Status Assessment   Patient has had a recent decline in their functional status and demonstrates the ability to make significant improvements in function in a reasonable and predictable amount of time.     Equipment Recommendations   None recommended by OT     Recommendations for Other Services         Precautions/Restrictions   Precautions Precautions: Fall Recall of Precautions/Restrictions: Impaired Restrictions Weight Bearing Restrictions Per Provider Order: No     Mobility Bed Mobility Overal bed mobility: Needs Assistance Bed Mobility: Supine to Sit, Sit to Supine     Supine to sit: Mod assist Sit to supine: Max assist   General bed mobility comments: Assist to manage B LE into bed; assist for trunk control and to scoot to EOB for supine to sit.    Transfers Overall transfer level: Needs assistance Equipment used: Rolling walker (2 wheels) Transfers: Sit to/from Stand, Bed to chair/wheelchair/BSC Sit to Stand: Max assist, Mod assist     Step pivot transfers: Mod assist, Max assist     General transfer comment: Chair to EOB and EOB to chair; much assist to boost from sitting surface and for steps to sitting surface. Poor balance.      Balance Overall balance assessment: Needs assistance Sitting-balance support: No upper extremity supported, Feet supported Sitting balance-Leahy Scale: Poor Sitting balance - Comments: Seated at EOB. Postural control: Left lateral lean Standing balance support: Bilateral upper extremity supported, During functional activity, Reliant on assistive device for balance Standing balance-Leahy Scale: Poor Standing balance comment: using RW  ADL either performed or assessed with clinical judgement   ADL Overall ADL's : Needs assistance/impaired Eating/Feeding: Moderate assistance;Sitting   Grooming: Maximal assistance;Sitting    Upper Body Bathing: Maximal assistance;Sitting   Lower Body Bathing: Maximal assistance;Total assistance;Sitting/lateral leans   Upper Body Dressing : Maximal assistance;Sitting   Lower Body Dressing: Total assistance;Maximal assistance;Sitting/lateral leans   Toilet Transfer: Moderate assistance;Maximal assistance;Rolling walker (2 wheels);Stand-pivot Statistician Details (indicate cue type and reason): Chair to EOB and EOB to chair with RW. Toileting- Clothing Manipulation and Hygiene: Maximal assistance;Total assistance               Vision Baseline Vision/History: 1 Wears glasses Ability to See in Adequate Light: 0 Adequate Patient Visual Report: No change from baseline Vision Assessment?: No apparent visual deficits     Perception Perception: Not tested       Praxis Praxis: Not tested       Pertinent Vitals/Pain Pain Assessment Pain Assessment: Faces Faces Pain Scale: No hurt     Extremity/Trunk Assessment Upper Extremity Assessment Upper Extremity Assessment: RUE deficits/detail;LUE deficits/detail (B UE limited to <50% available A/ROM today. R elbow limited in extension) RUE Deficits / Details: B UE limited to <50% available A/ROM today. R elbow limited in extension.   Lower Extremity Assessment Lower Extremity Assessment: Defer to PT evaluation   Cervical / Trunk Assessment Cervical / Trunk Assessment: Kyphotic   Communication Communication Communication: No apparent difficulties   Cognition Arousal: Alert Behavior During Therapy: WFL for tasks assessed/performed Cognition: No apparent impairments             OT - Cognition Comments: [name] answered most questions today. Pt was lethargic initially but able to follow commands fairly well. Further cognition assessment needed to determine if pt is having cognitive deficits.                 Following commands: Intact (With cuing.)       Cueing  General Comments   Cueing  Techniques: Verbal cues;Tactile cues;Visual cues;Gestural cues      Exercises     Shoulder Instructions      Home Living Family/patient expects to be discharged to:: Private residence Living Arrangements: Children Available Help at Discharge: Family;Available 24 hours/day Type of Home: House Home Access: Ramped entrance     Home Layout: One level     Bathroom Shower/Tub: Tub/shower unit;Sponge bathes at baseline   Bathroom Toilet: Handicapped height Bathroom Accessibility: Yes How Accessible: Accessible via walker;Accessible via wheelchair Home Equipment: Rolling Walker (2 wheels);Cane - single point;Shower seat;Hospital bed;Transport chair          Prior Functioning/Environment Prior Level of Function : Needs assist       Physical Assist : ADLs (physical)   ADLs (physical): Bathing;Dressing;Toileting;IADLs;Grooming Mobility Comments: household ambulator with RW ADLs Comments: Able to feed herself; assisted for all other ADL's. Pt patially assists with grooming. IADL's assisted.    OT Problem List: Decreased strength;Decreased range of motion;Decreased activity tolerance;Impaired balance (sitting and/or standing);Impaired UE functional use   OT Treatment/Interventions: Self-care/ADL training;Therapeutic exercise;DME and/or AE instruction;Therapeutic activities;Patient/family education;Balance training      OT Goals(Current goals can be found in the care plan section)   Acute Rehab OT Goals Patient Stated Goal: Improve function. OT Goal Formulation: With family Time For Goal Achievement: 12/15/24 Potential to Achieve Goals: Good   OT Frequency:  Min 2X/week    Co-evaluation PT/OT/SLP Co-Evaluation/Treatment: Yes Reason for Co-Treatment: To address functional/ADL transfers   OT goals addressed during  session: ADL's and self-care                       End of Session Equipment Utilized During Treatment: Gait belt;Rolling walker (2 wheels) Nurse  Communication: Mobility status  Activity Tolerance: Patient tolerated treatment well Patient left: in chair;with call bell/phone within reach;with family/visitor present  OT Visit Diagnosis: Unsteadiness on feet (R26.81);Other abnormalities of gait and mobility (R26.89);Muscle weakness (generalized) (M62.81);Other symptoms and signs involving cognitive function                Time: 8871-8848 OT Time Calculation (min): 23 min Charges:  OT General Charges $OT Visit: 1 Visit OT Evaluation $OT Eval Low Complexity: 1 Low  Nature Vogelsang OT, MOT   Jayson Person 12/01/2024, 2:52 PM

## 2024-12-01 NOTE — Plan of Care (Signed)
" °  Problem: Elimination: Goal: Will not experience complications related to urinary retention Outcome: Progressing   Problem: Pain Managment: Goal: General experience of comfort will improve and/or be controlled Outcome: Progressing   Problem: Safety: Goal: Ability to remain free from injury will improve Outcome: Progressing   Problem: Safety: Goal: Non-violent Restraint(s) Outcome: Progressing   "

## 2024-12-01 NOTE — Care Management Important Message (Signed)
 Important Message  Patient Details  Name: [name] MRN: [id number] Date of Birth: 1927-03-22   Important Message Given:  N/A - LOS <3 / Initial given by admissions     [name] 12/01/2024, 5:07 PM

## 2024-12-02 DIAGNOSIS — N390 Urinary tract infection, site not specified: Secondary | ICD-10-CM | POA: Diagnosis not present

## 2024-12-02 DIAGNOSIS — R531 Weakness: Secondary | ICD-10-CM

## 2024-12-02 DIAGNOSIS — I5032 Chronic diastolic (congestive) heart failure: Secondary | ICD-10-CM | POA: Diagnosis not present

## 2024-12-02 DIAGNOSIS — I4891 Unspecified atrial fibrillation: Secondary | ICD-10-CM | POA: Diagnosis not present

## 2024-12-02 DIAGNOSIS — I480 Paroxysmal atrial fibrillation: Secondary | ICD-10-CM | POA: Diagnosis not present

## 2024-12-02 DIAGNOSIS — E871 Hypo-osmolality and hyponatremia: Secondary | ICD-10-CM | POA: Diagnosis not present

## 2024-12-02 DIAGNOSIS — A419 Sepsis, unspecified organism: Secondary | ICD-10-CM | POA: Diagnosis not present

## 2024-12-02 DIAGNOSIS — G9341 Metabolic encephalopathy: Secondary | ICD-10-CM

## 2024-12-02 LAB — CBC WITH DIFFERENTIAL/PLATELET
Abs Immature Granulocytes: 0.17 K/uL — ABNORMAL HIGH (ref 0.00–0.07)
Basophils Absolute: 0 K/uL (ref 0.0–0.1)
Basophils Relative: 0 %
Eosinophils Absolute: 0.1 K/uL (ref 0.0–0.5)
Eosinophils Relative: 1 %
HCT: 26.5 % — ABNORMAL LOW (ref 36.0–46.0)
Hemoglobin: 8.7 g/dL — ABNORMAL LOW (ref 12.0–15.0)
Immature Granulocytes: 2 %
Lymphocytes Relative: 10 %
Lymphs Abs: 0.9 K/uL (ref 0.7–4.0)
MCH: 31.3 pg (ref 26.0–34.0)
MCHC: 32.8 g/dL (ref 30.0–36.0)
MCV: 95.3 fL (ref 80.0–100.0)
Monocytes Absolute: 1 K/uL (ref 0.1–1.0)
Monocytes Relative: 11 %
Neutro Abs: 7 K/uL (ref 1.7–7.7)
Neutrophils Relative %: 76 %
Platelets: 359 K/uL (ref 150–400)
RBC: 2.78 MIL/uL — ABNORMAL LOW (ref 3.87–5.11)
RDW: 13.3 % (ref 11.5–15.5)
WBC: 9.2 K/uL (ref 4.0–10.5)
nRBC: 0 % (ref 0.0–0.2)

## 2024-12-02 LAB — URINE CULTURE: Culture: >=100000 — AB

## 2024-12-02 LAB — BASIC METABOLIC PANEL WITH GFR
Anion gap: 16 — ABNORMAL HIGH (ref 5–15)
BUN: 19 mg/dL (ref 8–23)
CO2: 17 mmol/L — ABNORMAL LOW (ref 22–32)
Calcium: 8.3 mg/dL — ABNORMAL LOW (ref 8.9–10.3)
Chloride: 99 mmol/L (ref 98–111)
Creatinine, Ser: 0.76 mg/dL (ref 0.44–1.00)
GFR, Estimated: >60 mL/min
Glucose, Bld: 118 mg/dL — ABNORMAL HIGH (ref 70–99)
Potassium: 3.3 mmol/L — ABNORMAL LOW (ref 3.5–5.1)
Sodium: 132 mmol/L — ABNORMAL LOW (ref 135–145)

## 2024-12-02 LAB — GLUCOSE, CAPILLARY
Glucose-Capillary: 138 mg/dL — ABNORMAL HIGH (ref 70–99)
Glucose-Capillary: 193 mg/dL — ABNORMAL HIGH (ref 70–99)

## 2024-12-02 LAB — MAGNESIUM: Magnesium: 1.4 mg/dL — ABNORMAL LOW (ref 1.7–2.4)

## 2024-12-02 MED ORDER — ACETAMINOPHEN 325 MG PO TABS
650.0000 mg | ORAL_TABLET | Freq: Four times a day (QID) | ORAL | Status: DC
  Administered 2024-12-02 – 2024-12-03 (×3): 650 mg via ORAL
  Filled 2024-12-02 (×3): qty 2

## 2024-12-02 MED ORDER — MAGNESIUM SULFATE 4 GM/100ML IV SOLN
4.0000 g | Freq: Once | INTRAVENOUS | Status: AC
  Administered 2024-12-02: 4 g via INTRAVENOUS
  Filled 2024-12-02: qty 100

## 2024-12-02 NOTE — Plan of Care (Signed)
   Problem: Clinical Measurements: Goal: Respiratory complications will improve Outcome: Progressing Goal: Cardiovascular complication will be avoided Outcome: Progressing

## 2024-12-02 NOTE — TOC Progression Note (Signed)
 Transition of Care [facility]) - Progression Note    Patient Details  Name: [name] MRN: [id number] Date of Birth: 1927/04/22  [facility]) CM/SW Contact  Hoy DELENA Bigness, LCSW Phone Number: 12/02/2024, 8:51 AM  Clinical Narrative:    CSW spoke with pt's daughter, [name] via t/c to discuss recommendation for SNF placement. [name] reports pt has been to SNF in the past at [facility], [facility], and [facility]. She is agreeable to have referrals sent to these facilities. [name] is hopeful pt will improve prior to discharge to be able to return home with [facility]. She shares she will be with pt 24/7 if she does return home.  Referrals have been faxed out for SNF. ICM will continue to follow.   Expected Discharge Plan: Skilled Nursing Facility Barriers to Discharge: Continued Medical Work up               Expected Discharge Plan and Services In-house Referral: Clinical Social Work Discharge Planning Services: NA Post Acute Care Choice: Home Health, Skilled Nursing Facility Living arrangements for the past 2 months: Single Family Home                 DME Arranged: N/A DME Agency: NA                   Social Drivers of Health (SDOH) Interventions SDOH Screenings   Food Insecurity: Patient Unable To Answer (12/01/2024)  Housing: Low Risk (12/01/2024)  Transportation Needs: No Transportation Needs (12/01/2024)  Utilities: Not At Risk (12/01/2024)  Financial Resource Strain: Low Risk (07/13/2022)  Social Connections: Unknown (12/01/2024)  Tobacco Use: Low Risk (11/30/2024)    Readmission Risk Interventions    12/02/2024    8:50 AM 03/16/2024    9:57 AM 11/24/2022   10:52 AM  Readmission Risk Prevention Plan  Medication Screening  Complete   Transportation Screening Complete Complete Complete  PCP or Specialist Appt within 5-7 Days Complete    Home Care Screening Complete    Medication Review (RN CM) Complete    Medication Review (RN Care Manager)   Complete   PCP or Specialist appointment within 3-5 days of discharge   Not Complete  HRI or Home Care Consult   Complete  SW Recovery Care/Counseling Consult   Complete  Palliative Care Screening   Not Applicable  Skilled Nursing Facility   Not Complete

## 2024-12-02 NOTE — Plan of Care (Signed)
" °  Problem: Clinical Measurements: Goal: Will remain free from infection Outcome: Progressing   Problem: Clinical Measurements: Goal: Diagnostic test results will improve Outcome: Progressing   Problem: Elimination: Goal: Will not experience complications related to urinary retention Outcome: Progressing   "

## 2024-12-02 NOTE — Progress Notes (Signed)
 Physical Therapy Treatment Patient Details Name: [name] MRN: [id number] DOB: 06/06/1927 Today's Date: 12/02/2024   History of Present Illness [name] is a 89 y.o. female with medical history significant of hypertension, atrial fibrillation on Eliquis , T2DM, CKD 3 A who presents to the emergency department due to weakness and altered mental status.  Patient was unable to provide history, history was obtained from daughter at bedside.  Per report, patient has had 3-week onset of increasing confusion, weakness and altered mental status.  Workup done in the ED including urinalysis showed UTI and she was treated with amoxicillin  without much improvement, the course of her amoxicillin  was repeated and on completion of this, she continued to be symptomatic with her urine turning milky which was typical of her symptoms when she has UTI.  She was taken to the ED for further evaluation and management.    PT Comments  Patient presents in chair (assisted by nursing staff) and agreeable for therapy. Patient demonstrates slow labored movement for getting into/out of bed due to weakness, good return for completing BLE exercises while seated in chair and increased endurance/distance for taking steps in room without loss of balance, but limited mostly due to fatigue and weakness. Patient tolerated sitting up in chair after therapy with family members present. Patient will benefit from continued skilled physical therapy in hospital and recommended venue below to increase strength, balance, endurance for safe ADLs and gait.     If plan is discharge home, recommend the following: A lot of help with walking and/or transfers;A lot of help with bathing/dressing/bathroom;Assist for transportation;Assistance with feeding;Assistance with cooking/housework;Help with stairs or ramp for entrance;Supervision due to cognitive status   Can travel by private vehicle     Yes  Equipment Recommendations  None recommended by  PT    Recommendations for Other Services       Precautions / Restrictions Precautions Precautions: Fall Recall of Precautions/Restrictions: Intact Restrictions Weight Bearing Restrictions Per Provider Order: No     Mobility  Bed Mobility Overal bed mobility: Needs Assistance Bed Mobility: Supine to Sit, Sit to Supine     Supine to sit: Min assist, Mod assist Sit to supine: Min assist   General bed mobility comments: increased time, labored movement    Transfers Overall transfer level: Needs assistance Equipment used: Rolling walker (2 wheels) Transfers: Sit to/from Stand, Bed to chair/wheelchair/BSC Sit to Stand: Mod assist   Step pivot transfers: Mod assist       General transfer comment: unsteady labored movement    Ambulation/Gait Ambulation/Gait assistance: Mod assist Gait Distance (Feet): 18 Feet Assistive device: Rolling walker (2 wheels) Gait Pattern/deviations: Step-to pattern, Decreased step length - right, Decreased step length - left, Trunk flexed, Decreased stride length Gait velocity: Dec     General Gait Details: increased enduance, distance for ambulation with slow labored movement, limited mostly due to c/o fatigue and poor standing balance   Stairs             Wheelchair Mobility     Tilt Bed    Modified Rankin (Stroke Patients Only)       Balance Overall balance assessment: Needs assistance Sitting-balance support: Feet supported, No upper extremity supported Sitting balance-Leahy Scale: Fair Sitting balance - Comments: fair/good seated at EOB   Standing balance support: Reliant on assistive device for balance, During functional activity, Bilateral upper extremity supported Standing balance-Leahy Scale: Poor Standing balance comment: fair/poor using RW  Communication Communication Communication: No apparent difficulties  Cognition Arousal: Alert Behavior During Therapy: WFL for  tasks assessed/performed                             Following commands: Intact      Cueing Cueing Techniques: Verbal cues, Tactile cues  Exercises General Exercises - Lower Extremity Long Arc Quad: Seated, Strengthening, AROM, Both, 10 reps Hip Flexion/Marching: Seated, AROM, Strengthening, Both, 10 reps Toe Raises: Seated, AROM, Strengthening, Both, 15 reps Heel Raises: Seated, AROM, Strengthening, Both, 15 reps    General Comments        Pertinent Vitals/Pain Pain Assessment Pain Assessment: No/denies pain    Home Living                          Prior Function            PT Goals (current goals can now be found in the care plan section) Acute Rehab PT Goals Patient Stated Goal: Return home following rehab stay PT Goal Formulation: With patient Time For Goal Achievement: 12/15/24 Potential to Achieve Goals: Good Progress towards PT goals: Progressing toward goals    Frequency    Min 3X/week      PT Plan      Co-evaluation              AM-PAC PT 6 Clicks Mobility   Outcome Measure  Help needed turning from your back to your side while in a flat bed without using bedrails?: A Little Help needed moving from lying on your back to sitting on the side of a flat bed without using bedrails?: A Lot Help needed moving to and from a bed to a chair (including a wheelchair)?: A Lot Help needed standing up from a chair using your arms (e.g., wheelchair or bedside chair)?: A Little Help needed to walk in hospital room?: A Lot Help needed climbing 3-5 steps with a railing? : Total 6 Click Score: 13    End of Session   Activity Tolerance: Patient tolerated treatment well;Patient limited by fatigue Patient left: in chair;with call bell/phone within reach;with family/visitor present Nurse Communication: Mobility status PT Visit Diagnosis: Other abnormalities of gait and mobility (R26.89);Difficulty in walking, not elsewhere classified  (R26.2);Muscle weakness (generalized) (M62.81);Unsteadiness on feet (R26.81)     Time: 8571-8540 PT Time Calculation (min) (ACUTE ONLY): 31 min  Charges:    $Therapeutic Exercise: 8-22 mins $Therapeutic Activity: 8-22 mins PT General Charges $$ ACUTE PT VISIT: 1 Visit                     3:44 PM, 12/02/2024 Lynwood Music, MPT Physical Therapist with [facility] 336 [phone] office [phone] mobile phone

## 2024-12-02 NOTE — Progress Notes (Signed)
 " PROGRESS NOTE   [name]  FMW:[id number] DOB: 1927-01-14 DOA: 11/30/2024 PCP: Atilano Deward ORN, MD   Chief Complaint  Patient presents with   UTI   Level of care: Telemetry  Brief Admission History:  89 year old female with history of hypertension, A-fib on Eliquis , type 2 diabetes, CKD stage III, recurrent UTI, presented to the ER with complaints of generalized weakness and altered mental status.  Patient has had increasing confusion in the past 2 to 3 weeks.  She has received antibiotics as an outpatient but symptoms did not improve.   In the ER she was tachypneic, tachycardic.  EKG with A-fib with RVR.  Initiated on Cardizem  drip.  CBC revealed normocytic anemia, BMP with sodium 128, bicarb 16, potassium 4.3, BUN 26 and creatinine 1.1, UA was concerning for UTI, lactic acid was also elevated 2.6 concerning for sepsis.  This improved to 1.9 with fluids. CT scan of abdomen and pelvis without contrast showed significant bilateral hydronephrosis worse compared to past, no obstructing stone.  Also dilatation of urinary bladder.  Foley placed in the ER   Patient admitted for further management.   Assessment and Plan:  Sepsis secondary to UTI POA Bladder outlet obstruction - We will continue to treat with broad-spectrum antibiotics ceftriaxone  and vancomycin .  Previous culture positive for Enterococcus faecalis.  Will follow-up on urine and blood culture.  Lactic acid improved.   -Vital signs are stabilized. -CT with no pyelonephritis or abscess, noted bilateral hydronephrosis with no obstructive lesions.   -Foley catheter placed in the ER. -Needs outpatient urology follow-up    Paroxysmal atrial fibrillation with RVR--RESOLVED  -Rate improved with IV Cardizem  -pt now restarted on home Cardizem ,  atenolol  -transfer to telemetry   Hypomagnesemia -- IV replacement ordered   Hyponatremia (chronic) Dehydration Improving with diuresis from relieved obstruction   Type 2 diabetes  mellitus with hyperglycemia Continue ISS and hypoglycemia protocol Metformin  will be held at this time  CBG (last 3)  Recent Labs    12/01/24 1602 12/01/24 2117 12/02/24 0746  GLUCAP 117* 134* 138*   Essential hypertension (controlled) Resume atenolol , diltiazem    Chronic anemia: Hemoglobin 8.9, likely dilutional.  Will continue to monitor   DVT prophylaxis: apixaban  Code Status: DNR Family Communication:  Disposition:    Consultants:   Procedures:   Antimicrobials:    Subjective: Pt reports that she feels a little more headache symptoms today.  Wants to start back on scheduled acetaminophen   Objective: Vitals:   12/01/24 2347 12/02/24 0308 12/02/24 0747 12/02/24 1200  BP:    (!) 140/79  Pulse:    90  Resp:    20  Temp: (!) 100.6 F (38.1 C) 99.5 F (37.5 C) 97.6 F (36.4 C) 97.8 F (36.6 C)  TempSrc: Oral Axillary Oral Oral  SpO2:    100%  Weight:      Height:        Intake/Output Summary (Last 24 hours) at 12/02/2024 1212 Last data filed at 12/02/2024 1039 Gross per 24 hour  Intake 593.83 ml  Output 1425 ml  Net -831.17 ml   Filed Weights   11/30/24 1453 12/01/24 0000  Weight: 65 kg 63.4 kg   Examination:  General exam: Appears calm and comfortable  Respiratory system: Clear to auscultation. Respiratory effort normal. Cardiovascular system: irregularly irregular, normal S1 & S2 heard. No JVD, murmurs, rubs, gallops or clicks. No pedal edema. Gastrointestinal system: Abdomen is nondistended, soft and nontender. No organomegaly or masses felt. Normal bowel sounds heard.  Central nervous system: Alert and oriented. No focal neurological deficits. Extremities: Symmetric 5 x 5 power. Skin: No rashes, lesions or ulcers. Psychiatry: Judgement and insight appear normal. Mood & affect appropriate.   Data Reviewed: I have personally reviewed following labs and imaging studies  CBC: Recent Labs  Lab 11/30/24 1534 12/01/24 0455 12/02/24 0427  WBC 7.2  9.0 9.2  NEUTROABS 5.7  --  7.0  HGB 10.7* 8.9* 8.7*  HCT 33.7* 27.1* 26.5*  MCV 97.7 94.8 95.3  PLT 391 385 359    Basic Metabolic Panel: Recent Labs  Lab 11/30/24 1534 12/01/24 0455 12/01/24 1053 12/01/24 1651 12/02/24 0427 12/02/24 0748  NA 128* 130* 131* 131* 132*  --   K 4.3 3.8 3.9 3.9 3.3*  --   CL 93* 98 97* 100 99  --   CO2 16* 14* 14* 14* 17*  --   GLUCOSE 187* 175* 152* 103* 118*  --   BUN 26* 20 21 20 19   --   CREATININE 1.11* 0.83 1.01* 0.86 0.76  --   CALCIUM 9.1 8.2* 8.8* 8.3* 8.3*  --   MG  --  1.2*  --   --   --  1.4*  PHOS  --  2.4*  --   --   --   --     CBG: Recent Labs  Lab 12/01/24 0734 12/01/24 1115 12/01/24 1602 12/01/24 2117 12/02/24 0746  GLUCAP 154* 168* 117* 134* 138*    Recent Results (from the past 240 hours)  Urine Culture     Status: Abnormal   Collection Time: 11/30/24  4:23 PM   Specimen: Urine, Clean Catch  Result Value Ref Range Status   Specimen Description   Final    URINE, CLEAN CATCH Performed at [facility], [address] [place], [place] [address]    Special Requests   Final    NONE Reflexed from [id number] Performed at [facility], [address] [place], [place] [address]    Culture >=100,000 COLONIES/mL KLEBSIELLA PNEUMONIAE (A)  Final   Report Status 12/02/2024 FINAL  Final   Organism ID, Bacteria KLEBSIELLA PNEUMONIAE (A)  Final      Susceptibility   Klebsiella pneumoniae - MIC*    AMPICILLIN >=32 RESISTANT Resistant     CEFAZOLIN  (URINE) Value in next row Sensitive      2 SENSITIVEThis is a modified FDA-approved test that has been validated and its performance characteristics determined by the reporting laboratory.  This laboratory is certified under the Clinical Laboratory Improvement Amendments CLIA as qualified to perform high complexity clinical laboratory testing.    CEFEPIME  Value in next row Sensitive      2 SENSITIVEThis is a modified FDA-approved test that has been validated and its  performance characteristics determined by the reporting laboratory.  This laboratory is certified under the Clinical Laboratory Improvement Amendments CLIA as qualified to perform high complexity clinical laboratory testing.    ERTAPENEM Value in next row Sensitive      2 SENSITIVEThis is a modified FDA-approved test that has been validated and its performance characteristics determined by the reporting laboratory.  This laboratory is certified under the Clinical Laboratory Improvement Amendments CLIA as qualified to perform high complexity clinical laboratory testing.    CEFTRIAXONE  Value in next row Sensitive      2 SENSITIVEThis is a modified FDA-approved test that has been validated and its performance characteristics determined by the reporting laboratory.  This laboratory is certified under the Clinical  Laboratory Improvement Amendments CLIA as qualified to perform high complexity clinical laboratory testing.    CIPROFLOXACIN  Value in next row Sensitive      2 SENSITIVEThis is a modified FDA-approved test that has been validated and its performance characteristics determined by the reporting laboratory.  This laboratory is certified under the Clinical Laboratory Improvement Amendments CLIA as qualified to perform high complexity clinical laboratory testing.    GENTAMICIN Value in next row Sensitive      2 SENSITIVEThis is a modified FDA-approved test that has been validated and its performance characteristics determined by the reporting laboratory.  This laboratory is certified under the Clinical Laboratory Improvement Amendments CLIA as qualified to perform high complexity clinical laboratory testing.    NITROFURANTOIN Value in next row Sensitive      2 SENSITIVEThis is a modified FDA-approved test that has been validated and its performance characteristics determined by the reporting laboratory.  This laboratory is certified under the Clinical Laboratory Improvement Amendments CLIA as qualified to  perform high complexity clinical laboratory testing.    TRIMETH /SULFA  Value in next row Resistant      2 SENSITIVEThis is a modified FDA-approved test that has been validated and its performance characteristics determined by the reporting laboratory.  This laboratory is certified under the Clinical Laboratory Improvement Amendments CLIA as qualified to perform high complexity clinical laboratory testing.    AMPICILLIN/SULBACTAM Value in next row Sensitive      2 SENSITIVEThis is a modified FDA-approved test that has been validated and its performance characteristics determined by the reporting laboratory.  This laboratory is certified under the Clinical Laboratory Improvement Amendments CLIA as qualified to perform high complexity clinical laboratory testing.    PIP/TAZO Value in next row Sensitive      <=4 SENSITIVEThis is a modified FDA-approved test that has been validated and its performance characteristics determined by the reporting laboratory.  This laboratory is certified under the Clinical Laboratory Improvement Amendments CLIA as qualified to perform high complexity clinical laboratory testing.    MEROPENEM Value in next row Sensitive      <=4 SENSITIVEThis is a modified FDA-approved test that has been validated and its performance characteristics determined by the reporting laboratory.  This laboratory is certified under the Clinical Laboratory Improvement Amendments CLIA as qualified to perform high complexity clinical laboratory testing.    * >=100,000 COLONIES/mL KLEBSIELLA PNEUMONIAE  Culture, blood (routine x 2)     Status: None (Preliminary result)   Collection Time: 11/30/24  5:34 PM   Specimen: Left Antecubital; Blood  Result Value Ref Range Status   Specimen Description LEFT ANTECUBITAL AEROBIC BOTTLE ONLY  Final   Special Requests   Final    Blood Culture results may not be optimal due to an inadequate volume of blood received in culture bottles   Culture   Final    NO GROWTH 2  DAYS Performed at [facility], [address], [place], [place] [address]    Report Status PENDING  Incomplete  Culture, blood (routine x 2)     Status: None (Preliminary result)   Collection Time: 11/30/24  5:34 PM   Specimen: BLOOD LEFT WRIST  Result Value Ref Range Status   Specimen Description BLOOD LEFT WRIST AEROBIC BOTTLE ONLY  Final   Special Requests   Final    Blood Culture results may not be optimal due to an inadequate volume of blood received in culture bottles   Culture   Final    NO GROWTH 2 DAYS  Performed at [facility], [address], [place], [place] [address]    Report Status PENDING  Incomplete  MRSA Next Gen by PCR, Nasal     Status: None   Collection Time: 11/30/24 11:59 PM   Specimen: Nasal Mucosa; Nasal Swab  Result Value Ref Range Status   MRSA by PCR Next Gen NOT DETECTED NOT DETECTED Final    Comment: (NOTE) The GeneXpert MRSA Assay (FDA approved for NASAL specimens only), is one component of a comprehensive MRSA colonization surveillance program. It is not intended to diagnose MRSA infection nor to guide or monitor treatment for MRSA infections. Test performance is not FDA approved in patients less than 22 years old. Performed at [facility], [address], [place], [place] [address]      Radiology Studies: DG CHEST PORT 1 VIEW Result Date: 12/01/2024 EXAM: 1 VIEW(S) XRAY OF THE CHEST 12/01/2024 04:35:00 AM COMPARISON: 03/15/2024 CLINICAL HISTORY: Respiratory abnormality FINDINGS: LUNGS AND PLEURA: Stable elevated right hemidiaphragm. Right basilar atelectasis. Chronic interstitial opacities. No pleural effusion. No pneumothorax. HEART AND MEDIASTINUM: Aortic atherosclerosis. No acute abnormality of the cardiac and mediastinal silhouettes. BONES AND SOFT TISSUES: Polyarticular degenerative changes. Colonic interposition under right hemidiaphragm. No acute osseous abnormality. IMPRESSION: 1. Stable elevated right hemidiaphragm with right basilar  atelectasis and chronic interstitial opacities. No acute cardiopulmonary abnormality. 2. Aortic atherosclerosis. 3. Colonic interposition beneath the right hemidiaphragm. 4. Polyarticular degenerative changes. Electronically signed by: Evalene Coho MD MD 12/01/2024 04:59 AM EST RP Workstation: [id number]   CT ABDOMEN PELVIS WO CONTRAST Result Date: 11/30/2024 EXAM: CT ABDOMEN AND PELVIS WITHOUT CONTRAST 11/30/2024 08:42:40 PM TECHNIQUE: CT of the abdomen and pelvis was performed without the administration of intravenous contrast. Multiplanar reformatted images are provided for review. Automated exposure control, iterative reconstruction, and/or weight-based adjustment of the mA/kV was utilized to reduce the radiation dose to as low as reasonably achievable. COMPARISON: 12/05/2022 CLINICAL HISTORY: Acute lower abdominal pain. FINDINGS: LOWER CHEST: No acute abnormality. LIVER: The liver is unremarkable. GALLBLADDER AND BILE DUCTS: The gallbladder has been surgically removed. No biliary ductal dilatation. SPLEEN: No acute abnormality. PANCREAS: No acute abnormality. ADRENAL GLANDS: No acute abnormality. KIDNEYS, URETERS AND BLADDER: The kidneys demonstrate bilateral significant hydronephrosis and hydroureter, increased when compared with the prior exam. The bladder is well distended with mild bladder wall thickening. These changes likely represent chronic bladder outlet obstruction with subsequent ureteral dilatation. No calculi are identified. No perinephric or periureteral stranding. GI AND BOWEL: Stomach demonstrates no acute abnormality. Small bowel is unremarkable. No obstructive or inflammatory changes of the colon are seen. The appendix is within normal limits. There is no bowel obstruction. PERITONEUM AND RETROPERITONEUM: No ascites. No free air. VASCULATURE: Aorta is normal in caliber. Aortic calcifications are seen without aneurysmal dilatation. LYMPH NODES: No lymphadenopathy. REPRODUCTIVE ORGANS:  The uterus has been surgically removed. BONES AND SOFT TISSUES: No acute osseous abnormality is noted. Stable scoliosis concave to the right is noted in the lumbar spine. No focal soft tissue abnormality. IMPRESSION: 1. Bilateral significant hydronephrosis and hydroureter, increased compared to prior exam, likely representing chronic bladder outlet obstruction with subsequent ureteral dilatation. No calculi identified. 2. Mild bladder wall thickening. Electronically signed by: Oneil Devonshire MD MD 11/30/2024 09:30 PM EST RP Workstation: [id number]    Scheduled Meds:  acetaminophen   650 mg Oral Q6H   apixaban   2.5 mg Oral BID   atenolol   50 mg Oral BID   Chlorhexidine  Gluconate Cloth  6 each Topical Daily   diltiazem   120  mg Oral Daily   insulin  aspart  0-9 Units Subcutaneous TID WC   Continuous Infusions:  cefTRIAXone  (ROCEPHIN )  IV 2 g (12/02/24 0945)   diltiazem  (CARDIZEM ) infusion Stopped (12/01/24 0942)   magnesium  sulfate bolus IVPB 4 g (12/02/24 1039)   vancomycin        LOS: 2 days   Time spent: 53 mins  Gayleen Sholtz Vicci, MD How to contact the [facility] Attending or Consulting provider 7A - 7P or covering provider during after hours 7P -7A, for this patient?  Check the care team in [facility] and look for a) attending/consulting TRH provider listed and b) the TRH team listed Log into [email or web address] to find provider on call.  Locate the TRH provider you are looking for under [facility] and page to a number that you can be directly reached. If you still have difficulty reaching the provider, please page the [facility] (Director on Call) for the Hospitalists listed on amion for assistance.  12/02/2024, 12:12 PM    "

## 2024-12-02 NOTE — NC FL2 (Signed)
 " [place]  MEDICAID FL2 LEVEL OF CARE FORM     IDENTIFICATION  Patient Name: [name] Birthdate: 03/14/1927 Sex: female Admission Date (Current Location): 11/30/2024  [facility] and [place] Number:  [employer] and Address:  [facility],  [address], [place] [address]      Provider Number: [id number]  Attending Physician Name and Address:  Vicci Afton CROME, MD  Relative Name and Phone Number:  Woodman[place],[name]  Daughter, Emergency Contact  [phone]    Current Level of Care: Hospital Recommended Level of Care: Skilled Nursing Facility Prior Approval Number:    Date Approved/Denied:   PASRR Number: [id number]  Discharge Plan: SNF    Current Diagnoses: Patient Active Problem List   Diagnosis Date Noted   Hyponatremia 03/15/2024   Acute pyelonephritis 12/06/2022   Hypoalbuminemia 12/06/2022   Transaminitis 12/06/2022   Hypokalemia 12/06/2022   Atrial fibrillation with RVR (HCC) 12/05/2022   Sepsis (HCC) 11/23/2022   Fall at home, initial encounter 10/04/2022   Hypoalbuminemia due to protein-calorie malnutrition 10/04/2022   Sepsis secondary to UTI (HCC) 10/03/2022   Chronic diastolic CHF (congestive heart failure) (HCC) 09/24/2022   COVID-19 virus infection 09/20/2022   Diverticulitis of colon    Pressure injury of skin 08/18/2022   Dyslipidemia    Type 2 diabetes mellitus (HCC)    Hypomagnesemia 05/03/2022   Anemia of chronic disease 05/03/2022   B12 deficiency 05/03/2022   Paroxysmal atrial fibrillation (HCC) 05/02/2022   Acute metabolic encephalopathy 05/01/2022   Acute cystitis without hematuria 12/01/2021   Current use of long term anticoagulation 08/15/2021   Generalized weakness 08/15/2021   Body mass index 29.0-29.9, adult 07/12/2017   Low back pain 07/12/2017   Proteinuria 11/21/2016   Chronic kidney disease, stage III (moderate) (HCC) 11/19/2016   Vascular insufficiency of intestine 06/23/2014   Gouty arthropathy  06/11/2014   Osteoarthrosis 07/24/2013   Coronary atherosclerosis of native coronary artery 10/13/2012   Essential hypertension, benign 09/12/2011    Orientation RESPIRATION BLADDER Height & Weight     Self, Place  O2 (2L O2) Continent Weight: 139 lb 12.4 oz (63.4 kg) Height:  4' 11 (149.9 cm)  BEHAVIORAL SYMPTOMS/MOOD NEUROLOGICAL BOWEL NUTRITION STATUS      Continent Diet (heart healthy/carb modified)  AMBULATORY STATUS COMMUNICATION OF NEEDS Skin   Extensive Assist Verbally Normal                       Personal Care Assistance Level of Assistance  Bathing, Feeding, Dressing Bathing Assistance: Maximum assistance Feeding assistance: Maximum assistance Dressing Assistance: Maximum assistance     Functional Limitations Info  Sight, Hearing, Speech Sight Info: Adequate Hearing Info: Adequate Speech Info: Adequate    SPECIAL CARE FACTORS FREQUENCY  PT (By licensed PT), OT (By licensed OT)     PT Frequency: 5x/wk OT Frequency: 5x/wk            Contractures Contractures Info: Not present    Additional Factors Info  Code Status, Allergies Code Status Info: DNR Allergies Info: Macrobid (Nitrofurantoin), Toprol  Xl (Metoprolol  Succinate)           Current Medications (12/02/2024):  This is the current hospital active medication list Current Facility-Administered Medications  Medication Dose Route Frequency Provider Last Rate Last Admin   acetaminophen  (TYLENOL ) tablet 650 mg  650 mg Oral Q6H PRN Adefeso, Oladapo, DO   650 mg at 12/02/24 0118   Or   acetaminophen  (TYLENOL ) suppository 650 mg  650 mg Rectal Q6H PRN Adefeso, Oladapo, DO       apixaban  (ELIQUIS ) tablet 2.5 mg  2.5 mg Oral BID Adefeso, Oladapo, DO   2.5 mg at 12/01/24 2119   atenolol  (TENORMIN ) tablet 50 mg  50 mg Oral BID Sigdel, Santosh, MD   50 mg at 12/01/24 2119   cefTRIAXone  (ROCEPHIN ) 2 g in sodium chloride  0.9 % 100 mL IVPB  2 g Intravenous Q24H Mcarthur Pick, MD   Stopped at 12/01/24  1043   Chlorhexidine  Gluconate Cloth 2 % PADS 6 each  6 each Topical Daily Adefeso, Oladapo, DO   6 each at 12/01/24 0941   diltiazem  (CARDIZEM  CD) 24 hr capsule 120 mg  120 mg Oral Daily Sigdel, Santosh, MD   120 mg at 12/01/24 0940   diltiazem  (CARDIZEM ) 125 mg in dextrose  5% 125 mL (1 mg/mL) infusion  5-15 mg/hr Intravenous Continuous Francesca Elsie CROME, MD   Stopped at 12/01/24 9057   insulin  aspart (novoLOG ) injection 0-9 Units  0-9 Units Subcutaneous TID WC Adefeso, Oladapo, DO   1 Units at 12/02/24 0754   ondansetron  (ZOFRAN ) tablet 4 mg  4 mg Oral Q6H PRN Adefeso, Oladapo, DO       Or   ondansetron  (ZOFRAN ) injection 4 mg  4 mg Intravenous Q6H PRN Adefeso, Oladapo, DO       vancomycin  (VANCOCIN ) IVPB 1000 mg/200 mL premix  1,000 mg Intravenous Q48H Hunt, Madison H, RPH         Discharge Medications: Please see discharge summary for a list of discharge medications.  Relevant Imaging Results:  Relevant Lab Results:   Additional Information SSN: [id number]  Hoy DELENA Bigness, LCSW     "

## 2024-12-02 NOTE — Hospital Course (Signed)
 89 year old female with history of hypertension, A-fib on Eliquis , type 2 diabetes, CKD stage III, recurrent UTI, presented to the ER with complaints of generalized weakness and altered mental status.  Patient has had increasing confusion in the past 2 to 3 weeks.  She has received antibiotics as an outpatient but symptoms did not improve.   In the ER she was tachypneic, tachycardic.  EKG with A-fib with RVR.  Initiated on Cardizem  drip.  CBC revealed normocytic anemia, BMP with sodium 128, bicarb 16, potassium 4.3, BUN 26 and creatinine 1.1, UA was concerning for UTI, lactic acid was also elevated 2.6 concerning for sepsis.  This improved to 1.9 with fluids. CT scan of abdomen and pelvis without contrast showed significant bilateral hydronephrosis worse compared to past, no obstructing stone.  Also dilatation of urinary bladder.  Foley placed in the ER   Patient admitted for further management.

## 2024-12-03 ENCOUNTER — Telehealth: Payer: Self-pay | Admitting: Urology

## 2024-12-03 DIAGNOSIS — E871 Hypo-osmolality and hyponatremia: Secondary | ICD-10-CM | POA: Diagnosis not present

## 2024-12-03 DIAGNOSIS — I4891 Unspecified atrial fibrillation: Secondary | ICD-10-CM | POA: Diagnosis not present

## 2024-12-03 DIAGNOSIS — G9341 Metabolic encephalopathy: Secondary | ICD-10-CM | POA: Diagnosis not present

## 2024-12-03 DIAGNOSIS — I5032 Chronic diastolic (congestive) heart failure: Secondary | ICD-10-CM | POA: Diagnosis not present

## 2024-12-03 DIAGNOSIS — N390 Urinary tract infection, site not specified: Secondary | ICD-10-CM | POA: Diagnosis not present

## 2024-12-03 DIAGNOSIS — I480 Paroxysmal atrial fibrillation: Secondary | ICD-10-CM | POA: Diagnosis not present

## 2024-12-03 DIAGNOSIS — A419 Sepsis, unspecified organism: Secondary | ICD-10-CM | POA: Diagnosis not present

## 2024-12-03 DIAGNOSIS — R531 Weakness: Secondary | ICD-10-CM | POA: Diagnosis not present

## 2024-12-03 LAB — BASIC METABOLIC PANEL WITH GFR
Anion gap: 13 (ref 5–15)
BUN: 14 mg/dL (ref 8–23)
CO2: 22 mmol/L (ref 22–32)
Calcium: 8.4 mg/dL — ABNORMAL LOW (ref 8.9–10.3)
Chloride: 99 mmol/L (ref 98–111)
Creatinine, Ser: 0.53 mg/dL (ref 0.44–1.00)
GFR, Estimated: >60 mL/min
Glucose, Bld: 170 mg/dL — ABNORMAL HIGH (ref 70–99)
Potassium: 3.6 mmol/L (ref 3.5–5.1)
Sodium: 133 mmol/L — ABNORMAL LOW (ref 135–145)

## 2024-12-03 LAB — GLUCOSE, CAPILLARY
Glucose-Capillary: 190 mg/dL — ABNORMAL HIGH (ref 70–99)
Glucose-Capillary: 243 mg/dL — ABNORMAL HIGH (ref 70–99)

## 2024-12-03 LAB — MAGNESIUM: Magnesium: 1.9 mg/dL (ref 1.7–2.4)

## 2024-12-03 MED ORDER — CEFADROXIL 500 MG PO CAPS: 500.0000 mg | ORAL_CAPSULE | Freq: Two times a day (BID) | ORAL | Status: AC

## 2024-12-03 NOTE — Telephone Encounter (Signed)
 New patient hospital follow up with cath. Needs VT and new patient appt for 2 weeks. Call [phone] [facility] to schedule

## 2024-12-03 NOTE — Discharge Summary (Signed)
 Physician Discharge Summary  [name] FMW:[id number] DOB: [birth year] DOA: 11/30/2024  PCP: Atilano Deward ORN, MD  Admit date: 11/30/2024 Discharge date: 12/03/2024  Disposition:  SNF Rehab   Recommendations for Outpatient Follow-up:  Follow up with PCP in 1-2 weeks Follow up with [facility] in 1-2 weeks for foley removal and voiding trial Please obtain BMP/CBC in 1 week Please monitor blood glucose per facility protocol   Home Health:  SNF  Discharge Condition: STABLE   CODE STATUS: DNR  DIET:   heart healthy/carb modified  Brief Hospitalization Summary: Please see all hospital notes, images, labs for full details of the hospitalization. Admission provider HPI:  89 year old female with history of hypertension, A-fib on Eliquis , type 2 diabetes, CKD stage III, recurrent UTI, presented to the ER with complaints of generalized weakness and altered mental status.  Patient has had increasing confusion in the past 2 to 3 weeks.  She has received antibiotics as an outpatient but symptoms did not improve.   In the ER she was tachypneic, tachycardic.  EKG with A-fib with RVR.  Initiated on Cardizem  drip.  CBC revealed normocytic anemia, BMP with sodium 128, bicarb 16, potassium 4.3, BUN 26 and creatinine 1.1, UA was concerning for UTI, lactic acid was also elevated 2.6 concerning for sepsis.  This improved to 1.9 with fluids.  CT scan of abdomen and pelvis without contrast showed significant bilateral hydronephrosis worse compared to past, no obstructing stone.  Also dilatation of urinary bladder.  Foley placed in the ER.  Patient admitted for further management.  Hospital Course by listed problems addressed  Sepsis secondary to UTI -- RESOLVED  Bladder outlet obstruction -- TREATED with foley cath placement Klebsiella pneumonia UTI  - Vital signs are stabilized. -CT with no pyelonephritis or abscess, noted bilateral hydronephrosis with no obstructive lesions.   -Foley  catheter placed in the ER. -Needs outpatient urology follow-up in 1-2 weeks for foley removal and voiding trial  -DC on 3 more days of oral duricef to complete full 7 day course of antibiotics    Paroxysmal atrial fibrillation with RVR--RESOLVED  -Rate improved with IV Cardizem  -pt now restarted on home Cardizem ,  atenolol  -HRs have been stable and controlled     Hypomagnesemia -- REPLETED  -- IV replacement ordered and repleted    Hyponatremia (chronic) Dehydration Improved with diuresis from relieved obstruction Na is 133 on 12/03/24   Type 2 diabetes mellitus with hyperglycemia Continue ISS and hypoglycemia protocol Metformin  resumption at discharge CBG (last 3)  Recent Labs    12/02/24 0746 12/02/24 1601 12/03/24 0729  GLUCAP 138* 193* 190*   Essential hypertension (controlled) Resumed atenolol , diltiazem    Chronic normocytic anemia: Hemoglobin 8.9, likely dilutional.  Check CBC in 1 week recommended   Discharge Diagnoses:  Principal Problem:   Sepsis secondary to UTI [facility]) Active Problems:   Acute metabolic encephalopathy   Paroxysmal atrial fibrillation (HCC)   Generalized weakness   Chronic diastolic CHF (congestive heart failure) (HCC)   Atrial fibrillation with RVR (HCC)   Hyponatremia   Discharge Instructions: Discharge Instructions     Ambulatory referral to Urology   Complete by: As directed    Hospital follow up, acute urinary retention, foley removal and voiding trial      Allergies as of 12/03/2024       Reactions   Macrobid [nitrofurantoin] Diarrhea, Nausea And Vomiting   Toprol  Xl [metoprolol  Succinate] Other (See Comments)   Hair loss  Medication List     STOP taking these medications    cefdinir  300 MG capsule Commonly known as: OMNICEF    sulfamethoxazole -trimethoprim  400-80 MG tablet Commonly known as: BACTRIM        TAKE these medications    acetaminophen  325 MG tablet Commonly known as: TYLENOL  Take 2 tablets  (650 mg total) by mouth every 6 (six) hours as needed for mild pain, fever or headache (or Fever >/= 101).   atenolol  50 MG tablet Commonly known as: TENORMIN  TAKE 1 TABLET BY MOUTH TWICE A DAY   AZO CRANBERRY GUMMIES PO Take 2 tablets by mouth daily.   cefadroxil  500 MG capsule Commonly known as: DURICEF Take 1 capsule (500 mg total) by mouth 2 (two) times daily for 3 days. Start taking on: December 04, 2024   diltiazem  120 MG 24 hr capsule Commonly known as: CARDIZEM  CD TAKE 1 CAPSULE BY MOUTH EVERY DAY   Eliquis  2.5 MG Tabs tablet Generic drug: apixaban  TAKE 1 TABLET BY MOUTH TWICE A DAY   metFORMIN  500 MG 24 hr tablet Commonly known as: GLUCOPHAGE -XR Take 1,500 mg by mouth daily. What changed: Another medication with the same name was removed. Continue taking this medication, and follow the directions you see here.   MULTIVITAMIN GUMMIES ADULT PO Take 1 tablet by mouth daily.        Contact information for follow-up providers      [facility]. Schedule an appointment as soon as possible for a visit in 2 week(s).   Why: Hospital Follow Up for foley removal and voiding trial Contact information: [address] [place]  [address] [phone]        Atilano Deward ORN, MD. Schedule an appointment as soon as possible for a visit in 2 week(s).   Specialty: Family Medicine Why: Hospital Follow Up Contact information: [address] [place] [address] [phone]              Contact information for after-discharge care     Destination     [facility] .   Service: Skilled Nursing Contact information: [address] [place]  [address] [phone]                    Allergies[1] Allergies as of 12/03/2024       Reactions   Macrobid [nitrofurantoin] Diarrhea, Nausea And Vomiting   Toprol  Xl [metoprolol  Succinate] Other (See Comments)   Hair loss         Medication List     STOP taking these medications    cefdinir  300 MG capsule Commonly known as: OMNICEF    sulfamethoxazole -trimethoprim  400-80 MG tablet Commonly known as: BACTRIM        TAKE these medications    acetaminophen  325 MG tablet Commonly known as: TYLENOL  Take 2 tablets (650 mg total) by mouth every 6 (six) hours as needed for mild pain, fever or headache (or Fever >/= 101).   atenolol  50 MG tablet Commonly known as: TENORMIN  TAKE 1 TABLET BY MOUTH TWICE A DAY   AZO CRANBERRY GUMMIES PO Take 2 tablets by mouth daily.   cefadroxil  500 MG capsule Commonly known as: DURICEF Take 1 capsule (500 mg total) by mouth 2 (two) times daily for 3 days. Start taking on: December 04, 2024   diltiazem  120 MG 24 hr capsule Commonly known as: CARDIZEM  CD TAKE 1 CAPSULE BY MOUTH EVERY DAY   Eliquis  2.5 MG Tabs tablet Generic drug: apixaban  TAKE  1 TABLET BY MOUTH TWICE A DAY   metFORMIN  500 MG 24 hr tablet Commonly known as: GLUCOPHAGE -XR Take 1,500 mg by mouth daily. What changed: Another medication with the same name was removed. Continue taking this medication, and follow the directions you see here.   MULTIVITAMIN GUMMIES ADULT PO Take 1 tablet by mouth daily.        Procedures/Studies: DG CHEST PORT 1 VIEW Result Date: 12/01/2024 EXAM: 1 VIEW(S) XRAY OF THE CHEST 12/01/2024 04:35:00 AM COMPARISON: 03/15/2024 CLINICAL HISTORY: Respiratory abnormality FINDINGS: LUNGS AND PLEURA: Stable elevated right hemidiaphragm. Right basilar atelectasis. Chronic interstitial opacities. No pleural effusion. No pneumothorax. HEART AND MEDIASTINUM: Aortic atherosclerosis. No acute abnormality of the cardiac and mediastinal silhouettes. BONES AND SOFT TISSUES: Polyarticular degenerative changes. Colonic interposition under right hemidiaphragm. No acute osseous abnormality. IMPRESSION: 1. Stable elevated right hemidiaphragm with right basilar atelectasis and chronic interstitial  opacities. No acute cardiopulmonary abnormality. 2. Aortic atherosclerosis. 3. Colonic interposition beneath the right hemidiaphragm. 4. Polyarticular degenerative changes. Electronically signed by: Evalene Coho MD MD 12/01/2024 04:59 AM EST RP Workstation: [id number]   CT ABDOMEN PELVIS WO CONTRAST Result Date: 11/30/2024 EXAM: CT ABDOMEN AND PELVIS WITHOUT CONTRAST 11/30/2024 08:42:40 PM TECHNIQUE: CT of the abdomen and pelvis was performed without the administration of intravenous contrast. Multiplanar reformatted images are provided for review. Automated exposure control, iterative reconstruction, and/or weight-based adjustment of the mA/kV was utilized to reduce the radiation dose to as low as reasonably achievable. COMPARISON: 12/05/2022 CLINICAL HISTORY: Acute lower abdominal pain. FINDINGS: LOWER CHEST: No acute abnormality. LIVER: The liver is unremarkable. GALLBLADDER AND BILE DUCTS: The gallbladder has been surgically removed. No biliary ductal dilatation. SPLEEN: No acute abnormality. PANCREAS: No acute abnormality. ADRENAL GLANDS: No acute abnormality. KIDNEYS, URETERS AND BLADDER: The kidneys demonstrate bilateral significant hydronephrosis and hydroureter, increased when compared with the prior exam. The bladder is well distended with mild bladder wall thickening. These changes likely represent chronic bladder outlet obstruction with subsequent ureteral dilatation. No calculi are identified. No perinephric or periureteral stranding. GI AND BOWEL: Stomach demonstrates no acute abnormality. Small bowel is unremarkable. No obstructive or inflammatory changes of the colon are seen. The appendix is within normal limits. There is no bowel obstruction. PERITONEUM AND RETROPERITONEUM: No ascites. No free air. VASCULATURE: Aorta is normal in caliber. Aortic calcifications are seen without aneurysmal dilatation. LYMPH NODES: No lymphadenopathy. REPRODUCTIVE ORGANS: The uterus has been surgically removed.  BONES AND SOFT TISSUES: No acute osseous abnormality is noted. Stable scoliosis concave to the right is noted in the lumbar spine. No focal soft tissue abnormality. IMPRESSION: 1. Bilateral significant hydronephrosis and hydroureter, increased compared to prior exam, likely representing chronic bladder outlet obstruction with subsequent ureteral dilatation. No calculi identified. 2. Mild bladder wall thickening. Electronically signed by: Oneil Devonshire MD MD 11/30/2024 09:30 PM EST RP Workstation: [id number]     Subjective: Pt without complaints today, says she is agreeable to SNF rehab.   Discharge Exam: Vitals:   12/03/24 0558 12/03/24 0938  BP:  132/68  Pulse: 91   Resp:  18  Temp:  99.1 F (37.3 C)  SpO2: 94% 98%   Vitals:   12/03/24 0020 12/03/24 0557 12/03/24 0558 12/03/24 0938  BP: (!) 140/75 (!) 110/95  132/68  Pulse:  87 91   Resp: 20 18  18   Temp: 98 F (36.7 C) (!) 97.5 F (36.4 C)  99.1 F (37.3 C)  TempSrc: Oral Oral  Oral  SpO2: 95%  94% 98%  Weight:  Height:        General: Pt is alert, awake, not in acute distress Cardiovascular: normal S1/S2 +, no rubs, no gallops Respiratory: CTA bilaterally, no wheezing, no rhonchi GU: foley with amber urine draining  Abdominal: Soft, NT, ND, bowel sounds + Extremities: no edema, no cyanosis   The results of significant diagnostics from this hospitalization (including imaging, microbiology, ancillary and laboratory) are listed below for reference.     Microbiology: Recent Results (from the past 240 hours)  Urine Culture     Status: Abnormal   Collection Time: 11/30/24  4:23 PM   Specimen: Urine, Clean Catch  Result Value Ref Range Status   Specimen Description   Final    URINE, CLEAN CATCH Performed at [facility], [address] [place], [place] [address]    Special Requests   Final    NONE Reflexed from [id number] Performed at [facility] [address], [place], [place] [address]    Culture >=100,000  COLONIES/mL KLEBSIELLA PNEUMONIAE (A)  Final   Report Status 12/02/2024 FINAL  Final   Organism ID, Bacteria KLEBSIELLA PNEUMONIAE (A)  Final      Susceptibility   Klebsiella pneumoniae - MIC*    AMPICILLIN >=32 RESISTANT Resistant     CEFAZOLIN  (URINE) Value in next row Sensitive      2 SENSITIVEThis is a modified FDA-approved test that has been validated and its performance characteristics determined by the reporting laboratory.  This laboratory is certified under the Clinical Laboratory Improvement Amendments CLIA as qualified to perform high complexity clinical laboratory testing.    CEFEPIME  Value in next row Sensitive      2 SENSITIVEThis is a modified FDA-approved test that has been validated and its performance characteristics determined by the reporting laboratory.  This laboratory is certified under the Clinical Laboratory Improvement Amendments CLIA as qualified to perform high complexity clinical laboratory testing.    ERTAPENEM Value in next row Sensitive      2 SENSITIVEThis is a modified FDA-approved test that has been validated and its performance characteristics determined by the reporting laboratory.  This laboratory is certified under the Clinical Laboratory Improvement Amendments CLIA as qualified to perform high complexity clinical laboratory testing.    CEFTRIAXONE  Value in next row Sensitive      2 SENSITIVEThis is a modified FDA-approved test that has been validated and its performance characteristics determined by the reporting laboratory.  This laboratory is certified under the Clinical Laboratory Improvement Amendments CLIA as qualified to perform high complexity clinical laboratory testing.    CIPROFLOXACIN  Value in next row Sensitive      2 SENSITIVEThis is a modified FDA-approved test that has been validated and its performance characteristics determined by the reporting laboratory.  This laboratory is certified under the Clinical Laboratory Improvement Amendments CLIA as  qualified to perform high complexity clinical laboratory testing.    GENTAMICIN Value in next row Sensitive      2 SENSITIVEThis is a modified FDA-approved test that has been validated and its performance characteristics determined by the reporting laboratory.  This laboratory is certified under the Clinical Laboratory Improvement Amendments CLIA as qualified to perform high complexity clinical laboratory testing.    NITROFURANTOIN Value in next row Sensitive      2 SENSITIVEThis is a modified FDA-approved test that has been validated and its performance characteristics determined by the reporting laboratory.  This laboratory is certified under the Clinical Laboratory Improvement Amendments CLIA as qualified to perform high complexity clinical  laboratory testing.    TRIMETH /SULFA  Value in next row Resistant      2 SENSITIVEThis is a modified FDA-approved test that has been validated and its performance characteristics determined by the reporting laboratory.  This laboratory is certified under the Clinical Laboratory Improvement Amendments CLIA as qualified to perform high complexity clinical laboratory testing.    AMPICILLIN/SULBACTAM Value in next row Sensitive      2 SENSITIVEThis is a modified FDA-approved test that has been validated and its performance characteristics determined by the reporting laboratory.  This laboratory is certified under the Clinical Laboratory Improvement Amendments CLIA as qualified to perform high complexity clinical laboratory testing.    PIP/TAZO Value in next row Sensitive      <=4 SENSITIVEThis is a modified FDA-approved test that has been validated and its performance characteristics determined by the reporting laboratory.  This laboratory is certified under the Clinical Laboratory Improvement Amendments CLIA as qualified to perform high complexity clinical laboratory testing.    MEROPENEM Value in next row Sensitive      <=4 SENSITIVEThis is a modified FDA-approved  test that has been validated and its performance characteristics determined by the reporting laboratory.  This laboratory is certified under the Clinical Laboratory Improvement Amendments CLIA as qualified to perform high complexity clinical laboratory testing.    * >=100,000 COLONIES/mL KLEBSIELLA PNEUMONIAE  Culture, blood (routine x 2)     Status: None (Preliminary result)   Collection Time: 11/30/24  5:34 PM   Specimen: Left Antecubital; Blood  Result Value Ref Range Status   Specimen Description LEFT ANTECUBITAL AEROBIC BOTTLE ONLY  Final   Special Requests   Final    Blood Culture results may not be optimal due to an inadequate volume of blood received in culture bottles   Culture   Final    NO GROWTH 3 DAYS Performed at [facility], [address], [place], [place] [address]    Report Status PENDING  Incomplete  Culture, blood (routine x 2)     Status: None (Preliminary result)   Collection Time: 11/30/24  5:34 PM   Specimen: BLOOD LEFT WRIST  Result Value Ref Range Status   Specimen Description BLOOD LEFT WRIST AEROBIC BOTTLE ONLY  Final   Special Requests   Final    Blood Culture results may not be optimal due to an inadequate volume of blood received in culture bottles   Culture   Final    NO GROWTH 3 DAYS Performed at [facility], [address], [place], [place] [address]    Report Status PENDING  Incomplete  MRSA Next Gen by PCR, Nasal     Status: None   Collection Time: 11/30/24 11:59 PM   Specimen: Nasal Mucosa; Nasal Swab  Result Value Ref Range Status   MRSA by PCR Next Gen NOT DETECTED NOT DETECTED Final    Comment: (NOTE) The GeneXpert MRSA Assay (FDA approved for NASAL specimens only), is one component of a comprehensive MRSA colonization surveillance program. It is not intended to diagnose MRSA infection nor to guide or monitor treatment for MRSA infections. Test performance is not FDA approved in patients less than 6 years old. Performed at [facility], [address], [place], [place] [address]      Labs: BNP (last 3 results) Recent Labs    03/15/24 0941  BNP 233.0*   Basic Metabolic Panel: Recent Labs  Lab 12/01/24 0455 12/01/24 1053 12/01/24 1651 12/02/24 0427 12/02/24 0748 12/03/24 0458  NA 130* 131* 131*  132*  --  133*  K 3.8 3.9 3.9 3.3*  --  3.6  CL 98 97* 100 99  --  99  CO2 14* 14* 14* 17*  --  22  GLUCOSE 175* 152* 103* 118*  --  170*  BUN 20 21 20 19   --  14  CREATININE 0.83 1.01* 0.86 0.76  --  0.53  CALCIUM 8.2* 8.8* 8.3* 8.3*  --  8.4*  MG 1.2*  --   --   --  1.4* 1.9  PHOS 2.4*  --   --   --   --   --    Liver Function Tests: Recent Labs  Lab 12/01/24 0455  AST 21  ALT 26  ALKPHOS 60  BILITOT 0.3  PROT 6.0*  ALBUMIN 3.3*   No results for input(s): LIPASE, AMYLASE in the last 168 hours. No results for input(s): AMMONIA in the last 168 hours. CBC: Recent Labs  Lab 11/30/24 1534 12/01/24 0455 12/02/24 0427  WBC 7.2 9.0 9.2  NEUTROABS 5.7  --  7.0  HGB 10.7* 8.9* 8.7*  HCT 33.7* 27.1* 26.5*  MCV 97.7 94.8 95.3  PLT 391 385 359   Cardiac Enzymes: No results for input(s): CKTOTAL, CKMB, CKMBINDEX, TROPONINI in the last 168 hours. BNP: Invalid input(s): POCBNP CBG: Recent Labs  Lab 12/01/24 1602 12/01/24 2117 12/02/24 0746 12/02/24 1601 12/03/24 0729  GLUCAP 117* 134* 138* 193* 190*   D-Dimer No results for input(s): DDIMER in the last 72 hours. Hgb A1c Recent Labs    11/30/24 1534  HGBA1C 6.6*   Lipid Profile No results for input(s): CHOL, HDL, LDLCALC, TRIG, CHOLHDL, LDLDIRECT in the last 72 hours. Thyroid  function studies No results for input(s): TSH, T4TOTAL, T3FREE, THYROIDAB in the last 72 hours.  Invalid input(s): FREET3 Anemia work up No results for input(s): VITAMINB12, FOLATE, FERRITIN, TIBC, IRON, RETICCTPCT in the last 72 hours. Urinalysis    Component Value Date/Time   COLORURINE YELLOW 11/30/2024 1623    APPEARANCEUR TURBID (A) 11/30/2024 1623   LABSPEC 1.012 11/30/2024 1623   PHURINE 5.0 11/30/2024 1623   GLUCOSEU NEGATIVE 11/30/2024 1623   HGBUR NEGATIVE 11/30/2024 1623   BILIRUBINUR NEGATIVE 11/30/2024 1623   KETONESUR NEGATIVE 11/30/2024 1623   PROTEINUR 100 (A) 11/30/2024 1623   NITRITE NEGATIVE 11/30/2024 1623   LEUKOCYTESUR LARGE (A) 11/30/2024 1623   Sepsis Labs Recent Labs  Lab 11/30/24 1534 12/01/24 0455 12/02/24 0427  WBC 7.2 9.0 9.2   Microbiology Recent Results (from the past 240 hours)  Urine Culture     Status: Abnormal   Collection Time: 11/30/24  4:23 PM   Specimen: Urine, Clean Catch  Result Value Ref Range Status   Specimen Description   Final    URINE, CLEAN CATCH Performed at [facility], [address], [place], [place] [address]    Special Requests   Final    NONE Reflexed from [id number] Performed at [facility], [address], [place], [place] [address]    Culture >=100,000 COLONIES/mL KLEBSIELLA PNEUMONIAE (A)  Final   Report Status 12/02/2024 FINAL  Final   Organism ID, Bacteria KLEBSIELLA PNEUMONIAE (A)  Final      Susceptibility   Klebsiella pneumoniae - MIC*    AMPICILLIN >=32 RESISTANT Resistant     CEFAZOLIN  (URINE) Value in next row Sensitive      2 SENSITIVEThis is a modified FDA-approved test that has been validated and its performance characteristics determined by the reporting laboratory.  This  laboratory is certified under the Clinical Laboratory Improvement Amendments CLIA as qualified to perform high complexity clinical laboratory testing.    CEFEPIME  Value in next row Sensitive      2 SENSITIVEThis is a modified FDA-approved test that has been validated and its performance characteristics determined by the reporting laboratory.  This laboratory is certified under the Clinical Laboratory Improvement Amendments CLIA as qualified to perform high complexity clinical laboratory testing.    ERTAPENEM Value in next row Sensitive       2 SENSITIVEThis is a modified FDA-approved test that has been validated and its performance characteristics determined by the reporting laboratory.  This laboratory is certified under the Clinical Laboratory Improvement Amendments CLIA as qualified to perform high complexity clinical laboratory testing.    CEFTRIAXONE  Value in next row Sensitive      2 SENSITIVEThis is a modified FDA-approved test that has been validated and its performance characteristics determined by the reporting laboratory.  This laboratory is certified under the Clinical Laboratory Improvement Amendments CLIA as qualified to perform high complexity clinical laboratory testing.    CIPROFLOXACIN  Value in next row Sensitive      2 SENSITIVEThis is a modified FDA-approved test that has been validated and its performance characteristics determined by the reporting laboratory.  This laboratory is certified under the Clinical Laboratory Improvement Amendments CLIA as qualified to perform high complexity clinical laboratory testing.    GENTAMICIN Value in next row Sensitive      2 SENSITIVEThis is a modified FDA-approved test that has been validated and its performance characteristics determined by the reporting laboratory.  This laboratory is certified under the Clinical Laboratory Improvement Amendments CLIA as qualified to perform high complexity clinical laboratory testing.    NITROFURANTOIN Value in next row Sensitive      2 SENSITIVEThis is a modified FDA-approved test that has been validated and its performance characteristics determined by the reporting laboratory.  This laboratory is certified under the Clinical Laboratory Improvement Amendments CLIA as qualified to perform high complexity clinical laboratory testing.    TRIMETH /SULFA  Value in next row Resistant      2 SENSITIVEThis is a modified FDA-approved test that has been validated and its performance characteristics determined by the reporting laboratory.  This laboratory is  certified under the Clinical Laboratory Improvement Amendments CLIA as qualified to perform high complexity clinical laboratory testing.    AMPICILLIN/SULBACTAM Value in next row Sensitive      2 SENSITIVEThis is a modified FDA-approved test that has been validated and its performance characteristics determined by the reporting laboratory.  This laboratory is certified under the Clinical Laboratory Improvement Amendments CLIA as qualified to perform high complexity clinical laboratory testing.    PIP/TAZO Value in next row Sensitive      <=4 SENSITIVEThis is a modified FDA-approved test that has been validated and its performance characteristics determined by the reporting laboratory.  This laboratory is certified under the Clinical Laboratory Improvement Amendments CLIA as qualified to perform high complexity clinical laboratory testing.    MEROPENEM Value in next row Sensitive      <=4 SENSITIVEThis is a modified FDA-approved test that has been validated and its performance characteristics determined by the reporting laboratory.  This laboratory is certified under the Clinical Laboratory Improvement Amendments CLIA as qualified to perform high complexity clinical laboratory testing.    * >=100,000 COLONIES/mL KLEBSIELLA PNEUMONIAE  Culture, blood (routine x 2)     Status: None (Preliminary result)   Collection Time: 11/30/24  5:34  PM   Specimen: Left Antecubital; Blood  Result Value Ref Range Status   Specimen Description LEFT ANTECUBITAL AEROBIC BOTTLE ONLY  Final   Special Requests   Final    Blood Culture results may not be optimal due to an inadequate volume of blood received in culture bottles   Culture   Final    NO GROWTH 3 DAYS Performed at [facility], [address], [place], [place] [address]    Report Status PENDING  Incomplete  Culture, blood (routine x 2)     Status: None (Preliminary result)   Collection Time: 11/30/24  5:34 PM   Specimen: BLOOD LEFT WRIST  Result Value Ref  Range Status   Specimen Description BLOOD LEFT WRIST AEROBIC BOTTLE ONLY  Final   Special Requests   Final    Blood Culture results may not be optimal due to an inadequate volume of blood received in culture bottles   Culture   Final    NO GROWTH 3 DAYS Performed at [facility], [address], [place], [place] [address]    Report Status PENDING  Incomplete  MRSA Next Gen by PCR, Nasal     Status: None   Collection Time: 11/30/24 11:59 PM   Specimen: Nasal Mucosa; Nasal Swab  Result Value Ref Range Status   MRSA by PCR Next Gen NOT DETECTED NOT DETECTED Final    Comment: (NOTE) The GeneXpert MRSA Assay (FDA approved for NASAL specimens only), is one component of a comprehensive MRSA colonization surveillance program. It is not intended to diagnose MRSA infection nor to guide or monitor treatment for MRSA infections. Test performance is not FDA approved in patients less than 79 years old. Performed at [facility], [address], [place], [place] [address]    Time coordinating discharge: 41 mins   SIGNED:  Afton Louder, MD  [facility] 12/03/2024, 10:12 AM How to contact the [facility] Attending or Consulting provider 7A - 7P or covering provider during after hours 7P -7A, for this patient?  Check the care team in [facility] and look for a) attending/consulting TRH provider listed and b) the TRH team listed Log into [email or web address] and use [place]'s universal password to access. If you do not have the password, please contact the hospital operator. Locate the TRH provider you are looking for under [facility] and page to a number that you can be directly reached. If you still have difficulty reaching the provider, please page the [facility] (Director on Call) for the Hospitalists listed on amion for assistance.     [1]  Allergies Allergen Reactions   Macrobid [Nitrofurantoin] Diarrhea and Nausea And Vomiting   Toprol  Xl [Metoprolol  Succinate] Other (See Comments)    Hair loss

## 2024-12-03 NOTE — Care Management Important Message (Signed)
 Important Message  Patient Details  Name: [name] MRN: [id number] Date of Birth: 1927-08-21   Important Message Given:  Yes - Medicare IM     [name] 12/03/2024, 10:29 AM

## 2024-12-03 NOTE — Progress Notes (Addendum)
 AT 1054: RN attempted report. Was informed nurses were tied up but would give message for call back. Awaiting call back  1130: Gave Davane RN with [facility] nursing report.   At 1341: Patient has ate lunch, had bath and is ready for wheelchair transport to [facility]. Charge RN Inocente ok with allowing patient to eat lunch prior to DC to [facility] per [facility] Davane RN request. RN reviewed discharge with Legal Adriana Rhein Toney at bedside. Verbalized understanding

## 2024-12-03 NOTE — Discharge Instructions (Signed)
  IMPORTANT INFORMATION: PAY CLOSE ATTENTION   PHYSICIAN DISCHARGE INSTRUCTIONS  Follow with Primary care provider  Sasser, Paul W, MD  and other consultants as instructed by your Hospitalist Physician  SEEK MEDICAL CARE OR RETURN TO EMERGENCY ROOM IF SYMPTOMS COME BACK, WORSEN OR NEW PROBLEM DEVELOPS   Please note: You were cared for by a hospitalist during your hospital stay. Every effort will be made to forward records to your primary care provider.  You can request that your primary care provider send for your hospital records if they have not received them.  Once you are discharged, your primary care physician will handle any further medical issues. Please note that NO REFILLS for any discharge medications will be authorized once you are discharged, as it is imperative that you return to your primary care physician (or establish a relationship with a primary care physician if you do not have one) for your post hospital discharge needs so that they can reassess your need for medications and monitor your lab values.  Please get a complete blood count and chemistry panel checked by your Primary MD at your next visit, and again as instructed by your Primary MD.  Get Medicines reviewed and adjusted: Please take all your medications with you for your next visit with your Primary MD  Laboratory/radiological data: Please request your Primary MD to go over all hospital tests and procedure/radiological results at the follow up, please ask your primary care provider to get all Hospital records sent to his/her office.  In some cases, they will be blood work, cultures and biopsy results pending at the time of your discharge. Please request that your primary care provider follow up on these results.  If you are diabetic, please bring your blood sugar readings with you to your follow up appointment with primary care.    Please call and make your follow up appointments as soon as possible.    Also Note  the following: If you experience worsening of your admission symptoms, develop shortness of breath, life threatening emergency, suicidal or homicidal thoughts you must seek medical attention immediately by calling 911 or calling your MD immediately  if symptoms less severe.  You must read complete instructions/literature along with all the possible adverse reactions/side effects for all the Medicines you take and that have been prescribed to you. Take any new Medicines after you have completely understood and accpet all the possible adverse reactions/side effects.   Do not drive when taking Pain medications or sleeping medications (Benzodiazepines)  Do not take more than prescribed Pain, Sleep and Anxiety Medications. It is not advisable to combine anxiety,sleep and pain medications without talking with your primary care practitioner  Special Instructions: If you have smoked or chewed Tobacco  in the last 2 yrs please stop smoking, stop any regular Alcohol  and or any Recreational drug use.  Wear Seat belts while driving.  Do not drive if taking any narcotic, mind altering or controlled substances or recreational drugs or alcohol.        

## 2024-12-03 NOTE — TOC Transition Note (Signed)
 Transition of Care [facility]) - Discharge Note   Patient Details  Name: [name] MRN: [id number] Date of Birth: [birth year]  Transition of Care [facility]) CM/SW Contact:  Sharlyne Stabs, RN Phone Number: 12/03/2024, 10:11 AM   Clinical Narrative:   Patient is medically ready for discharge. Decided to go to [facility] before going home. Kerri provided a room number. RN calling report.     Final next level of care: Skilled Nursing Facility Barriers to Discharge: Barriers Resolved   Patient Goals and CMS Choice Patient states their goals for this hospitalization and ongoing recovery are:: agreeable to SNF CMS Medicare.gov Compare Post Acute Care list provided to:: Patient Represenative (must comment) Choice offered to / list presented to : Adult Children [place] ownership interest in [facility].provided to:: Adult Children    Discharge Placement                Patient to be transferred to facility by: Staff Name of family member notified: [name]- Daughter Patient and family notified of of transfer: 12/03/24  Discharge Plan and Services Additional resources added to the After Visit Summary for   In-house Referral: Clinical Social Work Discharge Planning Services: NA Post Acute Care Choice: Home Health, Skilled Nursing Facility          DME Arranged: N/A DME Agency: NA                  Social Drivers of Health (SDOH) Interventions SDOH Screenings   Food Insecurity: Patient Unable To Answer (12/01/2024)  Housing: Low Risk (12/01/2024)  Transportation Needs: No Transportation Needs (12/01/2024)  Utilities: Not At Risk (12/01/2024)  Financial Resource Strain: Low Risk (07/13/2022)  Social Connections: Moderately Integrated (12/03/2024)  Tobacco Use: Low Risk (11/30/2024)     Readmission Risk Interventions    12/02/2024    8:50 AM 03/16/2024    9:57 AM 11/24/2022   10:52 AM  Readmission Risk Prevention Plan  Medication Screening  Complete   Transportation Screening  Complete Complete Complete  PCP or Specialist Appt within 5-7 Days Complete    Home Care Screening Complete    Medication Review (RN CM) Complete    Medication Review (RN Care Manager)   Complete  PCP or Specialist appointment within 3-5 days of discharge   Not Complete  HRI or Home Care Consult   Complete  SW Recovery Care/Counseling Consult   Complete  Palliative Care Screening   Not Applicable  Skilled Nursing Facility   Not Complete

## 2024-12-04 ENCOUNTER — Encounter: Payer: Self-pay | Admitting: Adult Health

## 2024-12-04 ENCOUNTER — Non-Acute Institutional Stay (SKILLED_NURSING_FACILITY): Payer: Self-pay | Admitting: Adult Health

## 2024-12-04 DIAGNOSIS — N183 Chronic kidney disease, stage 3 unspecified: Secondary | ICD-10-CM

## 2024-12-04 DIAGNOSIS — A419 Sepsis, unspecified organism: Secondary | ICD-10-CM

## 2024-12-04 DIAGNOSIS — E1122 Type 2 diabetes mellitus with diabetic chronic kidney disease: Secondary | ICD-10-CM

## 2024-12-04 DIAGNOSIS — Z7901 Long term (current) use of anticoagulants: Secondary | ICD-10-CM

## 2024-12-04 DIAGNOSIS — I110 Hypertensive heart disease with heart failure: Secondary | ICD-10-CM | POA: Diagnosis not present

## 2024-12-04 DIAGNOSIS — N390 Urinary tract infection, site not specified: Secondary | ICD-10-CM | POA: Diagnosis not present

## 2024-12-04 DIAGNOSIS — M150 Primary generalized (osteo)arthritis: Secondary | ICD-10-CM | POA: Diagnosis not present

## 2024-12-04 DIAGNOSIS — E1165 Type 2 diabetes mellitus with hyperglycemia: Secondary | ICD-10-CM

## 2024-12-04 DIAGNOSIS — I4891 Unspecified atrial fibrillation: Secondary | ICD-10-CM

## 2024-12-04 DIAGNOSIS — I5032 Chronic diastolic (congestive) heart failure: Secondary | ICD-10-CM | POA: Diagnosis not present

## 2024-12-04 DIAGNOSIS — D638 Anemia in other chronic diseases classified elsewhere: Secondary | ICD-10-CM

## 2024-12-04 DIAGNOSIS — Z7984 Long term (current) use of oral hypoglycemic drugs: Secondary | ICD-10-CM

## 2024-12-04 NOTE — Telephone Encounter (Signed)
 Attempted to call [facility] to let them know we had patient scheduled no one answered phone

## 2024-12-04 NOTE — Progress Notes (Signed)
 " Location:  [facility] Room Number: [id number] Place of Service:  SNF (31)   CODE STATUS: dnr   Allergies[1]  Chief Complaint  Patient presents with   Hospitalization Follow-up    HPI:  She is a 89 year old woman who has been hospitalized from 11-30-24; through 12-03-24. Her past medical history includes: chf; hypertension; atrial fibrillation; type 2 diabetes mellitus; ckd stage 3; recurrent uti. She presented to the ED with complaints of generalized weakness and altered mental status. She had been experiencing increased confusion for the past 2-3 weeks. She had been placed on abt on an outpatient basis without symptom improvement.  In the ED she was tachypneic and tachycardic. Her EKG was found to in be afib with rvr and was initiated on cardizem  drip. She has normocytic anemia. The ct scan demonstrated significant bilateral hydronephrosis worse compared to the past with no obstructing stone. She had a foley placed in the ED.  Her sepsis secondary to uti klebsiella pneumoniae has resolved will need to complete duricef through 12-06-24. Bladder outlet obstruction treated with foley catheter placement.   She lives at home with 24 hour care. She is here for short term rehab with her goal to return back home. She will continue to be followed for her chronic illnesses including:  Atrial fibrillation with RVR: Chronic diastolic CHF (congestive heart failure):Benign hypertension with coincident congestive heart failure:  Past Medical History:  Diagnosis Date   Atrial fibrillation (HCC)    Coronary atherosclerosis of native coronary artery    Nonobstructive   Dyslipidemia    PSVT (paroxysmal supraventricular tachycardia)    TIA (transient ischemic attack)    Type 2 diabetes mellitus (HCC)    Diet controlled    Past Surgical History:  Procedure Laterality Date   ABDOMINAL HYSTERECTOMY     CARPAL TUNNEL RELEASE     x's 2   CHOLECYSTECTOMY     REPLACEMENT TOTAL KNEE  2002 &  2012   SHOULDER SURGERY  2001    Social History   Socioeconomic History   Marital status: Widowed    Spouse name: Not on file   Number of children: Not on file   Years of education: Not on file   Highest education level: Not on file  Occupational History   Not on file  Tobacco Use   Smoking status: Never   Smokeless tobacco: Never  Vaping Use   Vaping status: Never Used  Substance and Sexual Activity   Alcohol  use: No    Alcohol /week: 0.0 standard drinks of alcohol    Drug use: No   Sexual activity: Not on file  Other Topics Concern   Not on file  Social History Narrative   Not on file   Social Drivers of Health   Tobacco Use: Low Risk (12/04/2024)   Patient History    Smoking Tobacco Use: Never    Smokeless Tobacco Use: Never    Passive Exposure: Not on file  Financial Resource Strain: Low Risk (07/13/2022)   Overall Financial Resource Strain (CARDIA)    Difficulty of Paying Living Expenses: Not hard at all  Food Insecurity: Patient Unable To Answer (12/01/2024)   Epic    Worried About Programme Researcher, Broadcasting/film/video in the Last Year: Patient unable to answer    Ran Out of Food in the Last Year: Patient unable to answer  Transportation Needs: No Transportation Needs (12/01/2024)   Epic    Lack of Transportation (Medical): No    Lack  of Transportation (Non-Medical): No  Physical Activity: Not on file  Stress: Not on file  Social Connections: Moderately Integrated (12/03/2024)   Social Connection and Isolation Panel    Frequency of Communication with Friends and Family: More than three times a week    Frequency of Social Gatherings with Friends and Family: More than three times a week    Attends Religious Services: 1 to 4 times per year    Active Member of [employer] or Organizations: Yes    Attends Banker Meetings: 1 to 4 times per year    Marital Status: Widowed  Intimate Partner Violence: Patient Unable To Answer (12/01/2024)   Epic    Fear of Current or Ex-Partner:  Patient unable to answer    Emotionally Abused: Patient unable to answer    Physically Abused: Patient unable to answer    Sexually Abused: Patient unable to answer  Depression (PHQ2-9): Not on file  Alcohol  Screen: Not on file  Housing: Low Risk (12/01/2024)   Epic    Unable to Pay for Housing in the Last Year: No    Number of Times Moved in the Last Year: 0    Homeless in the Last Year: No  Utilities: Not At Risk (12/01/2024)   Epic    Threatened with loss of utilities: No  Health Literacy: Not on file   Family History  Problem Relation Age of Onset   Coronary artery disease Mother       VITAL SIGNS BP 136/80   Pulse 97   Temp (!) 97 F (36.1 C)   Resp 20   Ht 5' 1 (1.549 m)   Wt 142 lb 12.8 oz (64.8 kg)   SpO2 98%   BMI 26.98 kg/m   Outpatient Encounter Medications as of 12/04/2024  Medication Sig   acetaminophen  (TYLENOL ) 325 MG tablet Take 2 tablets (650 mg total) by mouth every 6 (six) hours as needed for mild pain, fever or headache (or Fever >/= 101).   atenolol  (TENORMIN ) 50 MG tablet TAKE 1 TABLET BY MOUTH TWICE A DAY   AZO CRANBERRY GUMMIES PO Take 2 tablets by mouth daily.   cefadroxil  (DURICEF) 500 MG capsule Take 1 capsule (500 mg total) by mouth 2 (two) times daily for 3 days.   diltiazem  (CARDIZEM  CD) 120 MG 24 hr capsule TAKE 1 CAPSULE BY MOUTH EVERY DAY   ELIQUIS  2.5 MG TABS tablet TAKE 1 TABLET BY MOUTH TWICE A DAY   metFORMIN  (GLUCOPHAGE -XR) 500 MG 24 hr tablet Take 1,500 mg by mouth daily.   Multiple Vitamins-Minerals (MULTIVITAMIN GUMMIES ADULT PO) Take 1 tablet by mouth daily.   No facility-administered encounter medications on file as of 12/04/2024.     SIGNIFICANT DIAGNOSTIC EXAMS  LABS:   11-30-24: wbc 7.2; hgb 10.7; hct 33.7; mcv 97.7 plt 391; glucose 187; bun 26; creat 1.11; k+ 4.3; na++ 128; ca 9.1; gfr 46; hgb A1c 6.4: urine culture: klebsiella pneumoniae  12-01-24; wbc 9.0; hgb 8.9; hct 27.1; mcv 94.8 plt 385; glucose 175; bun 20; creat  0.83; k+ 3.8; na++ 130; ca 8.2 gfr >60 protein 6.0 albumin 3.3 mag 1.2 12-03-24: glucose 170; bun 14; creat 0.53; k+ 3.6; na++ 133; ca 8.4; gfr >60 mag 1.9   Review of Systems  Constitutional:  Negative for malaise/fatigue.  Respiratory:  Negative for cough and shortness of breath.   Cardiovascular:  Negative for chest pain, palpitations and leg swelling.  Gastrointestinal:  Negative for abdominal pain, constipation and heartburn.  Musculoskeletal:  Positive for joint pain. Negative for back pain and myalgias.       Shoulders and knees   Skin: Negative.   Neurological:  Negative for dizziness.  Psychiatric/Behavioral:  The patient is not nervous/anxious.     Physical Exam Constitutional:      General: She is not in acute distress.    Appearance: She is well-developed. She is not diaphoretic.  Neck:     Thyroid : No thyromegaly.  Cardiovascular:     Rate and Rhythm: Normal rate and regular rhythm.     Heart sounds: Normal heart sounds.  Pulmonary:     Effort: Pulmonary effort is normal. No respiratory distress.     Breath sounds: Normal breath sounds.  Abdominal:     General: Bowel sounds are normal. There is no distension.     Palpations: Abdomen is soft.     Tenderness: There is no abdominal tenderness.  Musculoskeletal:     Cervical back: Neck supple.     Right lower leg: Edema present.     Left lower leg: Edema present.     Comments: Limited range of motion in bilateral shoulders   Lymphadenopathy:     Cervical: No cervical adenopathy.  Skin:    General: Skin is warm and dry.     Comments: Bilateral lower extremities discolored   Neurological:     Mental Status: She is alert. Mental status is at baseline.  Psychiatric:        Mood and Affect: Mood normal.      ASSESSMENT/ PLAN:  TODAY;  Sepsis secondary to uti: Klebsiella pneumoniae will continue duricef 500 mg twice daily through 12-06-24.   2. Generalized weakness: will continue therapy as directed to improve  upon her level of independence with her adl care.   3. Atrial fibrillation with RVR: heart rate is stable; will continue tenormin  50 mg twice daily and cardizem  cd 120 mg daily for rate control; will continue eliquis  2.5 mg twice daily   4. Chronic diastolic CHF (congestive heart failure): is euvolemic   5. Benign hypertension with coincident congestive heart failure:  b/p 136/80: will continue tenormin  50 mg twice daily  6. Primary arthritis of multiple joints: will change her to tylenol  cr 650 mg twice daily and twice daily as needed for pain management  7. CKD stage 3 due to type 2 diabetes mellitus: bun 14; creat 0.53 gfr >60  8. Anemia of chronic disease: hgb 8.9; felt to be dilutional   9. Type 2 diabetes mellitus with hyperglycemia without long term current use of insulin : will continue metformin  xr 1500 mg daily   Will check cbc; bmp mag level.      Barnie Seip NP [facility], [facility], [facility]   call [phone]      [1]  Allergies Allergen Reactions   Macrobid [Nitrofurantoin] Diarrhea and Nausea And Vomiting   Toprol  Xl [Metoprolol  Succinate] Other (See Comments)    Hair loss   "

## 2024-12-05 LAB — CULTURE, BLOOD (ROUTINE X 2)
Culture: NO GROWTH
Culture: NO GROWTH

## 2024-12-07 ENCOUNTER — Non-Acute Institutional Stay (SKILLED_NURSING_FACILITY): Payer: Self-pay | Admitting: Internal Medicine

## 2024-12-07 ENCOUNTER — Encounter: Payer: Self-pay | Admitting: Internal Medicine

## 2024-12-07 DIAGNOSIS — E1122 Type 2 diabetes mellitus with diabetic chronic kidney disease: Secondary | ICD-10-CM

## 2024-12-07 DIAGNOSIS — A419 Sepsis, unspecified organism: Secondary | ICD-10-CM | POA: Diagnosis not present

## 2024-12-07 DIAGNOSIS — Z7984 Long term (current) use of oral hypoglycemic drugs: Secondary | ICD-10-CM

## 2024-12-07 DIAGNOSIS — I4891 Unspecified atrial fibrillation: Secondary | ICD-10-CM | POA: Diagnosis not present

## 2024-12-07 DIAGNOSIS — N390 Urinary tract infection, site not specified: Secondary | ICD-10-CM

## 2024-12-07 DIAGNOSIS — F01C0 Vascular dementia, severe, without behavioral disturbance, psychotic disturbance, mood disturbance, and anxiety: Secondary | ICD-10-CM | POA: Diagnosis not present

## 2024-12-07 DIAGNOSIS — E440 Moderate protein-calorie malnutrition: Secondary | ICD-10-CM

## 2024-12-07 DIAGNOSIS — F0390 Unspecified dementia without behavioral disturbance: Secondary | ICD-10-CM | POA: Insufficient documentation

## 2024-12-07 DIAGNOSIS — E46 Unspecified protein-calorie malnutrition: Secondary | ICD-10-CM | POA: Insufficient documentation

## 2024-12-07 DIAGNOSIS — N183 Chronic kidney disease, stage 3 unspecified: Secondary | ICD-10-CM | POA: Diagnosis not present

## 2024-12-07 NOTE — Progress Notes (Unsigned)
 "   NURSING HOME LOCATION:  [facility] ROOM NUMBER: [id number]  CODE STATUS: DNR  PCP: Deward Soja, MD  This is a comprehensive admission note to this SNFperformed on this date less than 30 days from date of admission. Included are preadmission medical/surgical history; reconciled medication list; family history; social history and comprehensive review of systems.  Corrections and additions to the records were documented. Comprehensive physical exam was also performed. Additionally a clinical summary was entered for each active diagnosis pertinent to this admission in the Problem List to enhance continuity of care.  HPI: She was hospitalized 1/12 - 12/03/2024 presenting to the ED with complaints of generalized weakness and altered mental status. It was reported that she had increasing confusion over the past 2-3 weeks PTA.  She received antibiotics as an outpatient without clinical improvement. In the ED she was tachypneic and tachycardic.  EKG revealed A-fib with RVR; Cardizem  drip was initiated. Normocytic anemia was present with H/H of 10.7/ 33.7 . Hyponatremia was present with a sodium of 128.  UA was of concern for UTI.  Lactic acid was elevated at 2.6. CT scan of the abdomen/pelvis revealed bilateral hydronephrosis worse compared to prior imaging.  There is also dilation of the urinary bladder.  Foley was placed in the ED.  The reservoir Urineculture revealed Klebsiella pneumonia; parenteral antibiotics were transitioned to oral Duricef to complete of 7-day course. Glucoses while hospitalized ranged from a level of 103 up to 243. A1c was 6.6%. Hyponatremia was corrected with a final value of 133.  Hypomagnesemia now was also resolved. Initial creatinine was 1.11 and final value 0.53.  Nadir GFR was 46 and final value greater than 60 indicating CKD stage II. Final H/H values were 8.7/26.5.  Albumin is 3.3 and total protein 6.0 compatible with protein/caloric malnutrition.  The most  recent TSH was 1.807 on 03/15/2024.  The most recent B12 level was 221 on 05/03/2022. PT/OT consulted & recommended SNF for rehab.  Past medical and surgical history: Includes atrial fibrillation; CAD; dyslipidemia; history of PSVT; history of TIA; and diabetes with vascular complications and CKD. Surgeries and procedures include abdominal hysterectomy; carpal tunnel release; cholecystectomy; shoulder surgery; and TKA x 2.  Family history: reviewed, non contributory due to advanced age.  Social history: Nondrinker; non-smoker.   Review of systems: Clinical neurocognitive deficits made validity of responses questionable , compromising ROS completion.  When asked what diagnoses had been made; her response was I really cannot say.  She then went on to add I thought I needed some attention.  She then stated kidneys.  She now validates that she feels all right.  She denies any active symptoms except for constipation which her daughter denies. Her daughter lives with her and has done so for the last 3-4 years because of documented dementia.  She is not fallen as she is monitored closely.  She has had recurrent UTIs.  Constitutional: No fever, significant weight change, fatigue  Eyes: No redness, discharge, pain, vision change ENT/mouth: No nasal congestion, purulent discharge, earache, change in hearing, sore throat  Cardiovascular: No chest pain, palpitations, paroxysmal nocturnal dyspnea, claudication, edema  Respiratory: No cough, sputum production, hemoptysis, DOE, significant snoring, apnea Gastrointestinal: No heartburn, dysphagia, abdominal pain, nausea /vomiting, rectal bleeding, melena, change in bowels Genitourinary: No dysuria, hematuria, pyuria, incontinence, nocturia Musculoskeletal: No joint stiffness, joint swelling, weakness, pain Dermatologic: No rash, pruritus, change in appearance of skin Neurologic: No dizziness, headache, syncope, seizures, numbness, tingling Psychiatric:  No significant  anxiety, depression, insomnia, anorexia Endocrine: No change in hair/skin/nails, excessive thirst, excessive hunger, excessive urination  Hematologic/lymphatic: No significant bruising, lymphadenopathy, abnormal bleeding Allergy/immunology: No itchy/watery eyes, significant sneezing, urticaria, angioedema  Physical exam: Facies exhibits somewhat of a quizzical character.  She is missing an anterior mandibular tooth.  There is perioral wrinkling.  She has faint hyperpigmentation at the left lower lip.  There is a small polypoid lesion of the right chin.  Heart rhythm is irregular.  Foley catheter is in place.  Pedal pulses are decreased.  She has interosseous wasting in the hands. Pertinent or positive findings: She appears her age and somewhat suboptimally nourished. General appearance: no acute distress, increased work of breathing is present.   Lymphatic: No lymphadenopathy about the head, neck, axilla. Eyes: No conjunctival inflammation or lid edema is present. There is no scleral icterus. Ears:  External ear exam shows no significant lesions or deformities.   Nose:  External nasal examination shows no deformity or inflammation. Nasal mucosa are pink and moist without lesions, exudates Oral exam: Lips and gums are healthy appearing.There is no oropharyngeal erythema or exudate. Neck:  No thyromegaly, masses, tenderness noted.    Heart:  Normal rate and regular rhythm. S1 and S2 normal without gallop, murmur, click, rub.  Lungs: Chest clear to auscultation without wheezes, rhonchi, rales, rubs. Abdomen: Bowel sounds are normal.  Abdomen is soft and nontender with no organomegaly, hernias, masses. GU: Deferred  Extremities:  No cyanosis, clubbing, edema. Neurologic exam:  Strength equal  in upper & lower extremities. Balance, Rhomberg, finger to nose testing could not be completed due to clinical state Deep tendon reflexes are equal Skin: Warm & dry w/o tenting. No significant  lesions or rash.  See clinical summary under each active problem in the Problem List with associated updated therapeutic plan "

## 2024-12-07 NOTE — Assessment & Plan Note (Addendum)
 Afebrile with no active GU symptoms.  Continue to monitor.  Consider voiding trial if Urology follow-up cannot be arranged.

## 2024-12-07 NOTE — Assessment & Plan Note (Signed)
 MMSE will be performed at the SNF.  If there is progression; TSH, B12 level, and VDRL/RPR could be updated.

## 2024-12-07 NOTE — Assessment & Plan Note (Signed)
 Albumin 3.3 and total protein 6.0.  Interosseous wasting on physical exam.  Nutritionist to assess at the SNF.

## 2024-12-07 NOTE — Assessment & Plan Note (Signed)
 Rhythm is irregular but rate adequately controlled.  Continue to monitor.

## 2024-12-07 NOTE — Assessment & Plan Note (Signed)
 While hospitalized with urosepsis glucoses ranged from a low of 103 up to 243.  Diabetic control excellent as documented by A1c of 6.6%.  Nadir GFR 46 with final value greater than 60 indicating CKD stage II.  No indication for change in meds or dosages.  Continue to monitor.

## 2024-12-07 NOTE — Patient Instructions (Signed)
 See assessment and plan under each diagnosis in the problem list and acutely for this visit

## 2024-12-09 ENCOUNTER — Encounter: Payer: Self-pay | Admitting: Urology

## 2024-12-09 ENCOUNTER — Other Ambulatory Visit: Payer: Self-pay | Admitting: Cardiology

## 2024-12-09 ENCOUNTER — Ambulatory Visit (INDEPENDENT_AMBULATORY_CARE_PROVIDER_SITE_OTHER): Admitting: Urology

## 2024-12-09 ENCOUNTER — Telehealth: Payer: Self-pay

## 2024-12-09 VITALS — BP 129/76 | HR 94

## 2024-12-09 DIAGNOSIS — R339 Retention of urine, unspecified: Secondary | ICD-10-CM

## 2024-12-09 DIAGNOSIS — Z87440 Personal history of urinary (tract) infections: Secondary | ICD-10-CM

## 2024-12-09 DIAGNOSIS — N390 Urinary tract infection, site not specified: Secondary | ICD-10-CM

## 2024-12-09 MED ORDER — CIPROFLOXACIN HCL 500 MG PO TABS
500.0000 mg | ORAL_TABLET | Freq: Once | ORAL | Status: AC
  Administered 2024-12-09: 500 mg via ORAL

## 2024-12-09 MED ORDER — TRIMETHOPRIM 100 MG PO TABS: 100.0000 mg | ORAL_TABLET | Freq: Every day | ORAL | 11 refills | Status: DC

## 2024-12-09 NOTE — Telephone Encounter (Signed)
 Return call to pt pharmacy . Pharmacy state's that family called about pt's previous abt. Pt was on Bactrim  400 mg once daily. Family and pharmacy want to know if MD, McKenzie would like to switch pt medication to Bactrim  or will he like to keep pt on Trimpex  once daily.

## 2024-12-09 NOTE — Progress Notes (Signed)
 "  12/09/2024 9:56 AM   [name] 06-Jun-1927 [id number]  Referring provider: Vicci Afton CROME, MD [address] [place],  [place] [address]  Urinary retention   HPI: [name] is a 89yo here for evaluation of urinary retention and frequent UTI. She was admitted 11/30/24 with sepsis from a UTI and urinary retetnion. Foley was placed at that time. Voiding trial passed today. She has a history of frequent UTI for the past 4 years. The most UTIs she had was in 2-024 which was 6. She had a UTI in 02/2024 and then had not had a UTI until her admission 11/30/24. She is on D-Mannose and cranberry.    PMH: Past Medical History:  Diagnosis Date   Atrial fibrillation (HCC)    Coronary atherosclerosis of native coronary artery    Nonobstructive   Dyslipidemia    PSVT (paroxysmal supraventricular tachycardia)    TIA (transient ischemic attack)    Type 2 diabetes mellitus (HCC)    Diet controlled    Surgical History: Past Surgical History:  Procedure Laterality Date   ABDOMINAL HYSTERECTOMY     CARPAL TUNNEL RELEASE     x's 2   CHOLECYSTECTOMY     REPLACEMENT TOTAL KNEE  2002 & 2012   SHOULDER SURGERY  2001    Home Medications:  Allergies as of 12/09/2024       Reactions   Macrobid [nitrofurantoin] Diarrhea, Nausea And Vomiting   Toprol  Xl [metoprolol  Succinate] Other (See Comments)   Hair loss        Medication List        Accurate as of December 09, 2024  9:56 AM. If you have any questions, ask your nurse or doctor.          acetaminophen  650 MG CR tablet Commonly known as: TYLENOL  Take 650 mg by mouth 2 (two) times daily. Also may have 2 more times as needed   atenolol  50 MG tablet Commonly known as: TENORMIN  TAKE 1 TABLET BY MOUTH TWICE A DAY   AZO CRANBERRY GUMMIES PO Take 2 tablets by mouth daily.   diltiazem  120 MG 24 hr capsule Commonly known as: CARDIZEM  CD TAKE 1 CAPSULE BY MOUTH EVERY DAY   Eliquis  2.5 MG Tabs tablet Generic drug:  apixaban  TAKE 1 TABLET BY MOUTH TWICE A DAY   metFORMIN  500 MG 24 hr tablet Commonly known as: GLUCOPHAGE -XR Take 1,500 mg by mouth daily.   MULTIVITAMIN GUMMIES ADULT PO Take 1 tablet by mouth daily.        Allergies: Allergies[1]  Family History: Family History  Problem Relation Age of Onset   Coronary artery disease Mother     Social History:  reports that she has never smoked. She has never used smokeless tobacco. She reports that she does not drink alcohol  and does not use drugs.  ROS: All other review of systems were reviewed and are negative except what is noted above in HPI  Physical Exam: BP 129/76   Pulse 94   Constitutional:  Alert and oriented, No acute distress. HEENT: [place] AT, moist mucus membranes.  Trachea midline, no masses. Cardiovascular: No clubbing, cyanosis, or edema. Respiratory: Normal respiratory effort, no increased work of breathing. GI: Abdomen is soft, nontender, nondistended, no abdominal masses GU: No CVA tenderness.  Lymph: No cervical or inguinal lymphadenopathy. Skin: No rashes, bruises or suspicious lesions. Neurologic: Grossly intact, no focal deficits, moving all 4 extremities. Psychiatric: Normal mood and affect.  Laboratory Data: Lab Results  Component Value Date   WBC 9.2 12/02/2024   HGB 8.7 (L) 12/02/2024   HCT 26.5 (L) 12/02/2024   MCV 95.3 12/02/2024   PLT 359 12/02/2024    Lab Results  Component Value Date   CREATININE 0.53 12/03/2024    No results found for: PSA  No results found for: TESTOSTERONE  Lab Results  Component Value Date   HGBA1C 6.6 (H) 11/30/2024    Urinalysis    Component Value Date/Time   COLORURINE YELLOW 11/30/2024 1623   APPEARANCEUR TURBID (A) 11/30/2024 1623   LABSPEC 1.012 11/30/2024 1623   PHURINE 5.0 11/30/2024 1623   GLUCOSEU NEGATIVE 11/30/2024 1623   HGBUR NEGATIVE 11/30/2024 1623   BILIRUBINUR NEGATIVE 11/30/2024 1623   KETONESUR NEGATIVE 11/30/2024 1623   PROTEINUR  100 (A) 11/30/2024 1623   NITRITE NEGATIVE 11/30/2024 1623   LEUKOCYTESUR LARGE (A) 11/30/2024 1623    Lab Results  Component Value Date   BACTERIA MANY (A) 11/30/2024    Pertinent Imaging:  No results found for this or any previous visit.  No results found for this or any previous visit.  No results found for this or any previous visit.  No results found for this or any previous visit.  No results found for this or any previous visit.  No results found for this or any previous visit.  No results found for this or any previous visit.  No results found for this or any previous visit.   Assessment & Plan:    1. Urinary retention (Primary) Followup 3 months with [facility]. If the patient cannot urinate after 5-6 hours the foley is to be replaced  2. Frequent UTI -continue D mannose and cranberry. Start trimethoprim  100mg  qhs   No follow-ups on file.  [name] Clara, MD  [facility]       [1]  Allergies Allergen Reactions   Macrobid [Nitrofurantoin] Diarrhea and Nausea And Vomiting   Toprol  Xl [Metoprolol  Succinate] Other (See Comments)    Hair loss   "

## 2024-12-09 NOTE — Patient Instructions (Signed)

## 2024-12-09 NOTE — Progress Notes (Signed)
 Fill and Pull Catheter Removal  Patient is present today for a catheter removal due to hospital follow up.  75 ml of sterile water was instilled into the bladder when the patient felt the urge to urinate. 10 ml of water was then drained from the balloon.  A 14 FR foley cath was removed from the bladder no complications were noted .  Foley catheter intact and time of removal. Patient as then given some time to void on their own.  Patient can void  15 ml on their own after some time.  Patient tolerated well.  One oral prophylactic antibiotic given per MD orders  Performed by: Exie T. CMA  Follow up/ Additional notes: as scheduled

## 2024-12-10 ENCOUNTER — Other Ambulatory Visit (HOSPITAL_COMMUNITY)
Admission: RE | Admit: 2024-12-10 | Discharge: 2024-12-10 | Disposition: A | Discharge status: Home / Self Care | Source: Skilled Nursing Facility | Attending: Adult Health | Admitting: Adult Health

## 2024-12-10 DIAGNOSIS — E1165 Type 2 diabetes mellitus with hyperglycemia: Secondary | ICD-10-CM | POA: Insufficient documentation

## 2024-12-10 LAB — CBC
HCT: 31.4 % — ABNORMAL LOW (ref 36.0–46.0)
Hemoglobin: 10.1 g/dL — ABNORMAL LOW (ref 12.0–15.0)
MCH: 30.7 pg (ref 26.0–34.0)
MCHC: 32.2 g/dL (ref 30.0–36.0)
MCV: 95.4 fL (ref 80.0–100.0)
Platelets: 525 K/uL — ABNORMAL HIGH (ref 150–400)
RBC: 3.29 MIL/uL — ABNORMAL LOW (ref 3.87–5.11)
RDW: 13.8 % (ref 11.5–15.5)
WBC: 10.8 K/uL — ABNORMAL HIGH (ref 4.0–10.5)
nRBC: 0 % (ref 0.0–0.2)

## 2024-12-10 LAB — BASIC METABOLIC PANEL WITH GFR
Anion gap: 17 — ABNORMAL HIGH (ref 5–15)
BUN: 17 mg/dL (ref 8–23)
CO2: 18 mmol/L — ABNORMAL LOW (ref 22–32)
Calcium: 9.1 mg/dL (ref 8.9–10.3)
Chloride: 97 mmol/L — ABNORMAL LOW (ref 98–111)
Creatinine, Ser: 0.82 mg/dL (ref 0.44–1.00)
GFR, Estimated: >60 mL/min
Glucose, Bld: 191 mg/dL — ABNORMAL HIGH (ref 70–99)
Potassium: 4.5 mmol/L (ref 3.5–5.1)
Sodium: 133 mmol/L — ABNORMAL LOW (ref 135–145)

## 2024-12-10 LAB — MAGNESIUM: Magnesium: 1.6 mg/dL — ABNORMAL LOW (ref 1.7–2.4)

## 2024-12-16 NOTE — Telephone Encounter (Signed)
 Tried calling pharmacy with no answer. Will tried back later Stop bactrim , start trimethoprim 

## 2024-12-16 NOTE — Telephone Encounter (Addendum)
 Called pt daughter's and was unsure of the medication. [name] is made aware that I will contact the [facility] to see what medication pt is currently on. Tried calling the [facility] with no answer.

## 2024-12-22 ENCOUNTER — Other Ambulatory Visit: Payer: Self-pay | Admitting: Adult Health

## 2024-12-22 ENCOUNTER — Non-Acute Institutional Stay (SKILLED_NURSING_FACILITY): Payer: Self-pay | Admitting: Adult Health

## 2024-12-22 DIAGNOSIS — I5032 Chronic diastolic (congestive) heart failure: Secondary | ICD-10-CM | POA: Diagnosis not present

## 2024-12-22 DIAGNOSIS — E1122 Type 2 diabetes mellitus with diabetic chronic kidney disease: Secondary | ICD-10-CM

## 2024-12-22 DIAGNOSIS — G9341 Metabolic encephalopathy: Secondary | ICD-10-CM | POA: Diagnosis not present

## 2024-12-22 DIAGNOSIS — Z7984 Long term (current) use of oral hypoglycemic drugs: Secondary | ICD-10-CM | POA: Diagnosis not present

## 2024-12-22 DIAGNOSIS — I110 Hypertensive heart disease with heart failure: Secondary | ICD-10-CM

## 2024-12-22 DIAGNOSIS — I4891 Unspecified atrial fibrillation: Secondary | ICD-10-CM | POA: Diagnosis not present

## 2024-12-22 DIAGNOSIS — N183 Chronic kidney disease, stage 3 unspecified: Secondary | ICD-10-CM

## 2024-12-22 MED ORDER — METFORMIN HCL ER 500 MG PO TB24: 1500.0000 mg | ORAL_TABLET | Freq: Every day | ORAL | 0 refills | Status: AC

## 2024-12-22 MED ORDER — ATENOLOL 50 MG PO TABS: 50.0000 mg | ORAL_TABLET | Freq: Two times a day (BID) | ORAL | 3 refills | Status: AC

## 2024-12-22 MED ORDER — APIXABAN 2.5 MG PO TABS: 2.5000 mg | ORAL_TABLET | Freq: Two times a day (BID) | ORAL | 0 refills | Status: AC

## 2024-12-22 MED ORDER — TRIMETHOPRIM 100 MG PO TABS: 100.0000 mg | ORAL_TABLET | Freq: Every day | ORAL | 0 refills | Status: AC

## 2024-12-22 MED ORDER — DILTIAZEM HCL ER COATED BEADS 120 MG PO CP24: 120.0000 mg | ORAL_CAPSULE | Freq: Every day | ORAL | 0 refills | Status: AC

## 2024-12-22 MED ORDER — MAGNESIUM OXIDE -MG SUPPLEMENT 400 MG PO CAPS: 400.0000 mg | ORAL_CAPSULE | Freq: Every day | ORAL | 0 refills | Status: AC

## 2025-02-17 ENCOUNTER — Ambulatory Visit: Admitting: Urology

## 2025-02-23 ENCOUNTER — Ambulatory Visit: Admitting: Cardiology

## 2025-03-12 ENCOUNTER — Ambulatory Visit: Admitting: Urology
